# Patient Record
Sex: Female | Born: 1945 | Race: Black or African American | Hispanic: No | Smoking: Former smoker
Health system: Southern US, Community
[De-identification: ages and names within clinical notes are randomized; demographics above are authoritative.]

## PROBLEM LIST (undated history)

## (undated) DIAGNOSIS — I10 Essential (primary) hypertension: Secondary | ICD-10-CM

## (undated) DIAGNOSIS — C50919 Malignant neoplasm of unspecified site of unspecified female breast: Secondary | ICD-10-CM

## (undated) DIAGNOSIS — K56609 Unspecified intestinal obstruction, unspecified as to partial versus complete obstruction: Secondary | ICD-10-CM

## (undated) DIAGNOSIS — J45909 Unspecified asthma, uncomplicated: Secondary | ICD-10-CM

## (undated) DIAGNOSIS — E119 Type 2 diabetes mellitus without complications: Secondary | ICD-10-CM

## (undated) DIAGNOSIS — M1990 Unspecified osteoarthritis, unspecified site: Secondary | ICD-10-CM

## (undated) HISTORY — PX: TONSILLECTOMY: SUR1361

## (undated) HISTORY — PX: ABDOMINAL HYSTERECTOMY: SHX81

## (undated) HISTORY — PX: THUMB AMPUTATION: SHX804

---

## 2018-06-11 DIAGNOSIS — K56609 Unspecified intestinal obstruction, unspecified as to partial versus complete obstruction: Secondary | ICD-10-CM

## 2018-06-11 HISTORY — DX: Unspecified intestinal obstruction, unspecified as to partial versus complete obstruction: K56.609

## 2018-07-06 ENCOUNTER — Emergency Department (HOSPITAL_COMMUNITY): Payer: Medicare Other

## 2018-07-06 ENCOUNTER — Other Ambulatory Visit: Payer: Self-pay

## 2018-07-06 ENCOUNTER — Inpatient Hospital Stay (HOSPITAL_COMMUNITY): Payer: Medicare Other

## 2018-07-06 ENCOUNTER — Encounter (HOSPITAL_COMMUNITY): Payer: Self-pay | Admitting: Emergency Medicine

## 2018-07-06 ENCOUNTER — Inpatient Hospital Stay (HOSPITAL_COMMUNITY)
Admission: EM | Admit: 2018-07-06 | Discharge: 2018-07-11 | DRG: 388 | Disposition: A | Discharge status: Skilled Nursing Facility | Payer: Medicare Other | Attending: Surgery | Admitting: Surgery

## 2018-07-06 ENCOUNTER — Observation Stay (HOSPITAL_COMMUNITY): Payer: Medicare Other

## 2018-07-06 DIAGNOSIS — I10 Essential (primary) hypertension: Secondary | ICD-10-CM | POA: Diagnosis present

## 2018-07-06 DIAGNOSIS — Z794 Long term (current) use of insulin: Secondary | ICD-10-CM

## 2018-07-06 DIAGNOSIS — R32 Unspecified urinary incontinence: Secondary | ICD-10-CM | POA: Diagnosis present

## 2018-07-06 DIAGNOSIS — R0902 Hypoxemia: Secondary | ICD-10-CM

## 2018-07-06 DIAGNOSIS — Z9221 Personal history of antineoplastic chemotherapy: Secondary | ICD-10-CM | POA: Diagnosis not present

## 2018-07-06 DIAGNOSIS — Z923 Personal history of irradiation: Secondary | ICD-10-CM

## 2018-07-06 DIAGNOSIS — R911 Solitary pulmonary nodule: Secondary | ICD-10-CM | POA: Diagnosis present

## 2018-07-06 DIAGNOSIS — J9601 Acute respiratory failure with hypoxia: Secondary | ICD-10-CM | POA: Diagnosis not present

## 2018-07-06 DIAGNOSIS — J811 Chronic pulmonary edema: Secondary | ICD-10-CM | POA: Diagnosis present

## 2018-07-06 DIAGNOSIS — Z87891 Personal history of nicotine dependence: Secondary | ICD-10-CM

## 2018-07-06 DIAGNOSIS — K565 Intestinal adhesions [bands], unspecified as to partial versus complete obstruction: Secondary | ICD-10-CM

## 2018-07-06 DIAGNOSIS — K56609 Unspecified intestinal obstruction, unspecified as to partial versus complete obstruction: Secondary | ICD-10-CM | POA: Diagnosis not present

## 2018-07-06 DIAGNOSIS — Z853 Personal history of malignant neoplasm of breast: Secondary | ICD-10-CM | POA: Diagnosis not present

## 2018-07-06 DIAGNOSIS — J449 Chronic obstructive pulmonary disease, unspecified: Secondary | ICD-10-CM | POA: Diagnosis present

## 2018-07-06 DIAGNOSIS — Z90710 Acquired absence of both cervix and uterus: Secondary | ICD-10-CM

## 2018-07-06 DIAGNOSIS — E119 Type 2 diabetes mellitus without complications: Secondary | ICD-10-CM | POA: Diagnosis present

## 2018-07-06 DIAGNOSIS — R05 Cough: Secondary | ICD-10-CM

## 2018-07-06 DIAGNOSIS — G4733 Obstructive sleep apnea (adult) (pediatric): Secondary | ICD-10-CM | POA: Diagnosis present

## 2018-07-06 DIAGNOSIS — Z0189 Encounter for other specified special examinations: Secondary | ICD-10-CM

## 2018-07-06 DIAGNOSIS — R058 Other specified cough: Secondary | ICD-10-CM

## 2018-07-06 HISTORY — DX: Malignant neoplasm of unspecified site of unspecified female breast: C50.919

## 2018-07-06 HISTORY — DX: Unspecified osteoarthritis, unspecified site: M19.90

## 2018-07-06 HISTORY — DX: Type 2 diabetes mellitus without complications: E11.9

## 2018-07-06 HISTORY — DX: Essential (primary) hypertension: I10

## 2018-07-06 HISTORY — DX: Unspecified intestinal obstruction, unspecified as to partial versus complete obstruction: K56.609

## 2018-07-06 HISTORY — DX: Unspecified asthma, uncomplicated: J45.909

## 2018-07-06 LAB — URINALYSIS, ROUTINE W REFLEX MICROSCOPIC
Bilirubin Urine: NEGATIVE
Glucose, UA: NEGATIVE mg/dL
Hgb urine dipstick: NEGATIVE
Ketones, ur: NEGATIVE mg/dL
Leukocytes,Ua: NEGATIVE
NITRITE: NEGATIVE
Protein, ur: NEGATIVE mg/dL
Specific Gravity, Urine: 1.031 — ABNORMAL HIGH (ref 1.005–1.030)
pH: 7 (ref 5.0–8.0)

## 2018-07-06 LAB — COMPREHENSIVE METABOLIC PANEL
ALT: 14 U/L (ref 0–44)
AST: 31 U/L (ref 15–41)
Albumin: 3.6 g/dL (ref 3.5–5.0)
Alkaline Phosphatase: 48 U/L (ref 38–126)
Anion gap: 13 (ref 5–15)
BUN: 5 mg/dL — ABNORMAL LOW (ref 8–23)
CO2: 26 mmol/L (ref 22–32)
Calcium: 9 mg/dL (ref 8.9–10.3)
Chloride: 95 mmol/L — ABNORMAL LOW (ref 98–111)
Creatinine, Ser: 0.85 mg/dL (ref 0.44–1.00)
GFR calc non Af Amer: >60 mL/min (ref >60)
Glucose, Bld: 191 mg/dL — ABNORMAL HIGH (ref 70–99)
Potassium: 3.8 mmol/L (ref 3.5–5.1)
Sodium: 134 mmol/L — ABNORMAL LOW (ref 135–145)
Total Bilirubin: 0.5 mg/dL (ref 0.3–1.2)
Total Protein: 7.2 g/dL (ref 6.5–8.1)

## 2018-07-06 LAB — CBC WITH DIFFERENTIAL/PLATELET
Abs Immature Granulocytes: 0.04 10*3/uL (ref 0.00–0.07)
Basophils Absolute: 0 10*3/uL (ref 0.0–0.1)
Basophils Relative: 0 %
EOS PCT: 1 %
Eosinophils Absolute: 0.1 10*3/uL (ref 0.0–0.5)
HCT: 40.7 % (ref 36.0–46.0)
Hemoglobin: 13.1 g/dL (ref 12.0–15.0)
Immature Granulocytes: 0 %
Lymphocytes Relative: 26 %
Lymphs Abs: 2.9 10*3/uL (ref 0.7–4.0)
MCH: 29.9 pg (ref 26.0–34.0)
MCHC: 32.2 g/dL (ref 30.0–36.0)
MCV: 92.9 fL (ref 80.0–100.0)
MONO ABS: 0.7 10*3/uL (ref 0.1–1.0)
Monocytes Relative: 6 %
Neutro Abs: 7.6 10*3/uL (ref 1.7–7.7)
Neutrophils Relative %: 67 %
Platelets: 278 10*3/uL (ref 150–400)
RBC: 4.38 MIL/uL (ref 3.87–5.11)
RDW: 11.9 % (ref 11.5–15.5)
WBC: 11.4 10*3/uL — ABNORMAL HIGH (ref 4.0–10.5)
nRBC: 0 % (ref 0.0–0.2)

## 2018-07-06 LAB — HEMOGLOBIN A1C
Hgb A1c MFr Bld: 7.1 % — ABNORMAL HIGH (ref 4.8–5.6)
Mean Plasma Glucose: 157.07 mg/dL

## 2018-07-06 LAB — GLUCOSE, CAPILLARY
Glucose-Capillary: 160 mg/dL — ABNORMAL HIGH (ref 70–99)
Glucose-Capillary: 144 mg/dL — ABNORMAL HIGH (ref 70–99)
Glucose-Capillary: 149 mg/dL — ABNORMAL HIGH (ref 70–99)

## 2018-07-06 LAB — MAGNESIUM: Magnesium: 1.6 mg/dL — ABNORMAL LOW (ref 1.7–2.4)

## 2018-07-06 LAB — LIPASE, BLOOD: Lipase: 36 U/L (ref 11–51)

## 2018-07-06 MED ORDER — MORPHINE SULFATE (PF) 4 MG/ML IV SOLN
4.0000 mg | Freq: Once | INTRAVENOUS | Status: AC
  Administered 2018-07-06: 4 mg via INTRAVENOUS
  Filled 2018-07-06: qty 1

## 2018-07-06 MED ORDER — DIPHENHYDRAMINE HCL 12.5 MG/5ML PO ELIX: 12.5000 mg | ORAL_SOLUTION | Freq: Four times a day (QID) | ORAL | Status: DC | PRN

## 2018-07-06 MED ORDER — SIMETHICONE 80 MG PO CHEW
40.0000 mg | CHEWABLE_TABLET | Freq: Four times a day (QID) | ORAL | Status: DC | PRN
  Filled 2018-07-06 (×2): qty 1

## 2018-07-06 MED ORDER — INSULIN ASPART 100 UNIT/ML ~~LOC~~ SOLN
0.0000 [IU] | Freq: Three times a day (TID) | SUBCUTANEOUS | Status: DC
  Administered 2018-07-06: 3 [IU] via SUBCUTANEOUS
  Administered 2018-07-06 – 2018-07-08 (×4): 2 [IU] via SUBCUTANEOUS
  Administered 2018-07-08 – 2018-07-09 (×3): 3 [IU] via SUBCUTANEOUS
  Administered 2018-07-10 (×3): 2 [IU] via SUBCUTANEOUS
  Administered 2018-07-11: 5 [IU] via SUBCUTANEOUS

## 2018-07-06 MED ORDER — IPRATROPIUM-ALBUTEROL 0.5-2.5 (3) MG/3ML IN SOLN: 3.0000 mL | RESPIRATORY_TRACT | Status: DC | PRN

## 2018-07-06 MED ORDER — ONDANSETRON 4 MG PO TBDP: 4.0000 mg | ORAL_TABLET | Freq: Four times a day (QID) | ORAL | Status: DC | PRN

## 2018-07-06 MED ORDER — MORPHINE SULFATE (PF) 2 MG/ML IV SOLN
2.0000 mg | INTRAVENOUS | Status: DC | PRN
  Administered 2018-07-06 (×2): 2 mg via INTRAVENOUS
  Filled 2018-07-06 (×2): qty 1

## 2018-07-06 MED ORDER — ENOXAPARIN SODIUM 40 MG/0.4ML ~~LOC~~ SOLN
40.0000 mg | SUBCUTANEOUS | Status: DC
  Administered 2018-07-06 – 2018-07-10 (×5): 40 mg via SUBCUTANEOUS
  Filled 2018-07-06 (×5): qty 0.4

## 2018-07-06 MED ORDER — DIPHENHYDRAMINE HCL 50 MG/ML IJ SOLN: 12.5000 mg | Freq: Four times a day (QID) | INTRAMUSCULAR | Status: DC | PRN

## 2018-07-06 MED ORDER — ACETAMINOPHEN 650 MG RE SUPP: 650.0000 mg | Freq: Four times a day (QID) | RECTAL | Status: DC | PRN

## 2018-07-06 MED ORDER — POTASSIUM CHLORIDE 10 MEQ/100ML IV SOLN
10.0000 meq | INTRAVENOUS | Status: AC
  Administered 2018-07-06 (×2): 10 meq via INTRAVENOUS
  Filled 2018-07-06 (×2): qty 100

## 2018-07-06 MED ORDER — IPRATROPIUM-ALBUTEROL 0.5-2.5 (3) MG/3ML IN SOLN
3.0000 mL | Freq: Once | RESPIRATORY_TRACT | Status: AC
  Administered 2018-07-06: 3 mL via RESPIRATORY_TRACT
  Filled 2018-07-06: qty 3

## 2018-07-06 MED ORDER — METHOCARBAMOL 500 MG PO TABS: 500.0000 mg | ORAL_TABLET | Freq: Four times a day (QID) | ORAL | Status: DC | PRN

## 2018-07-06 MED ORDER — DIATRIZOATE MEGLUMINE & SODIUM 66-10 % PO SOLN
90.0000 mL | Freq: Once | ORAL | Status: AC
  Administered 2018-07-06: 90 mL via NASOGASTRIC
  Filled 2018-07-06: qty 90

## 2018-07-06 MED ORDER — PANTOPRAZOLE SODIUM 40 MG IV SOLR
40.0000 mg | Freq: Every day | INTRAVENOUS | Status: DC
  Administered 2018-07-06 – 2018-07-07 (×2): 40 mg via INTRAVENOUS
  Filled 2018-07-06 (×2): qty 40

## 2018-07-06 MED ORDER — ACETAMINOPHEN 325 MG PO TABS
650.0000 mg | ORAL_TABLET | Freq: Four times a day (QID) | ORAL | Status: DC | PRN
  Administered 2018-07-07 – 2018-07-10 (×3): 650 mg via ORAL
  Filled 2018-07-06 (×3): qty 2

## 2018-07-06 MED ORDER — HYDRALAZINE HCL 20 MG/ML IJ SOLN: 10.0000 mg | INTRAMUSCULAR | Status: DC | PRN

## 2018-07-06 MED ORDER — ONDANSETRON HCL 4 MG/2ML IJ SOLN
4.0000 mg | Freq: Once | INTRAMUSCULAR | Status: AC
  Administered 2018-07-06: 4 mg via INTRAVENOUS
  Filled 2018-07-06: qty 2

## 2018-07-06 MED ORDER — IOHEXOL 300 MG/ML  SOLN
125.0000 mL | Freq: Once | INTRAMUSCULAR | Status: AC | PRN
  Administered 2018-07-06: 125 mL via INTRAVENOUS

## 2018-07-06 MED ORDER — INSULIN DETEMIR 100 UNIT/ML ~~LOC~~ SOLN
5.0000 [IU] | Freq: Every day | SUBCUTANEOUS | Status: DC
  Administered 2018-07-06 – 2018-07-10 (×5): 5 [IU] via SUBCUTANEOUS
  Filled 2018-07-06 (×6): qty 0.05

## 2018-07-06 MED ORDER — ONDANSETRON HCL 4 MG/2ML IJ SOLN: 4.0000 mg | Freq: Four times a day (QID) | INTRAMUSCULAR | Status: DC | PRN

## 2018-07-06 MED ORDER — MAGNESIUM SULFATE 2 GM/50ML IV SOLN
2.0000 g | Freq: Once | INTRAVENOUS | Status: AC
  Administered 2018-07-06: 2 g via INTRAVENOUS
  Filled 2018-07-06: qty 50

## 2018-07-06 MED ORDER — OXYCODONE HCL 5 MG PO TABS: 5.0000 mg | ORAL_TABLET | ORAL | Status: DC | PRN

## 2018-07-06 MED ORDER — SODIUM CHLORIDE 0.9 % IV SOLN
INTRAVENOUS | Status: DC
  Administered 2018-07-06 – 2018-07-08 (×5): via INTRAVENOUS

## 2018-07-06 NOTE — H&P (Addendum)
[facility] Surgery Admission Note  Charlene Houston 04-Apr-1946  [id number].    Requesting MD: Charlann Lange, PA-C; Delora Fuel MD Chief Complaint/Reason for Consult: SBO  HPI: Charlene Houston is a 73 y.o. female with a history of insulin-dependent diabetes, hypertension, asthma, and a remote history of breast cancer who presented to [facility] for abdominal pain, N/V and bloating.   Patient reports that around noon yesterday after eating a bag of potato chips she began having some sharp abdominal pain in her epigastric area that radiated to her back.  She notes that the abdominal pain was severe, and came in waves on/off.  It was associated with bloating, nausea as well as 2 episodes of nonbilious emesis with the last just prior to arrival to the emergency department.  The patient reports that she did have one small bowel movement approximately 1 hour after the onset of her symptoms. She denies further bowel movements and is no longer passing any flatus.  She did try water with baking soda at home without any relief.  She denies similar pain in the past.  She presented to the emergency department for evaluation and was found to have a small bowel obstruction on CT.  General surgery was consulted.  Patient denies history of small bowel obstruction in the past.  She does have a history of abdominal hysterectomy and also reports a exploratory laparoscopy for an ovarian surgery but I am unable to see this in her records or care everywhere.  Her symptoms have improved since IV pain medication and she is resting comfortably.  She denies any fever, chills, chest pain, cough urinary symptoms or diarrhea. She is not on blood thinners. She does note that she has some mild shortness of breath secondary to her asthma. She is currently on 2L in the ED. She does not wear oxygen at home.   ROS: Review of Systems  Constitutional: Negative for chills and fever.  Respiratory: Positive for shortness of  breath and wheezing. Negative for cough.   Cardiovascular: Negative for chest pain and leg swelling.  Gastrointestinal: Positive for abdominal pain, nausea and vomiting. Negative for blood in stool, constipation, diarrhea and melena.       Bloating  Genitourinary: Negative for dysuria and urgency.  Skin: Negative for rash.  All other systems reviewed and are negative.   All systems reviewed and otherwise negative except for as above  History reviewed. No pertinent family history.  Past Medical History:  Diagnosis Date  . Arthritis   . Asthma   . Breast cancer ([place])   . Diabetes mellitus without complication ([place])   . Hypertension     Past Surgical History:  Procedure Laterality Date  . ABDOMINAL HYSTERECTOMY      Social History:  reports that she has quit smoking. She has never used smokeless tobacco. She reports previous alcohol use. She reports previous drug use.  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 Lives with son  Allergies:  Allergies  Allergen Reactions  . Prozac [Fluoxetine Hcl] Other (See Comments)    Gittery    (Not in a hospital admission)   Prior to Admission medications   Not on File    Blood pressure 121/73, pulse 83, temperature 97.9 F (36.6 C), temperature source Oral, resp. rate 15, height 5\' 6"  (1.676 m), weight 106.6 kg, SpO2 98 %. Physical Exam: General: pleasant, WD/WN AA female who is laying in bed  in NAD HEENT: head is normocephalic, atraumatic.  Sclera are noninjected.  Pupils equal and round.  Ears and nose without any masses or lesions.  Mouth is pink and moist. Dentition fair Heart: regular, rate, and rhythm.  No obvious murmurs, gallops, or rubs noted.  Palpable pedal pulses bilaterally Lungs: Patient on 2L in room satting at 97-99%. When taken off 2L, O2 sats come to 84-86%. She has normal effort, that is nonlabored. There is some faint expiratory wheezing R  > L. No rales or rhonchi.   Abd: Soft, moderate distension, tenderness of the epigastrium with voluntary guarding. No involuntary guarding, rebound or rigidity. Hypoactive BS. No masses, hernias, or organomegaly MS: all 4 extremities are symmetrical with no cyanosis, clubbing, or edema. Skin: warm and dry with no masses, lesions, or rashes Psych: A&Ox3 with an appropriate affect. Neuro: cranial nerves grossly intact, extremity CSM intact bilaterally, normal speech  Results for orders placed or performed during the hospital encounter of 07/06/18 (from the past 48 hour(s))  CBC with Differential     Status: Abnormal   Collection Time: 07/06/18  3:36 AM  Result Value Ref Range   WBC 11.4 (H) 4.0 - 10.5 K/uL   RBC 4.38 3.87 - 5.11 MIL/uL   Hemoglobin 13.1 12.0 - 15.0 g/dL   HCT 40.7 36.0 - 46.0 %   MCV 92.9 80.0 - 100.0 fL   MCH 29.9 26.0 - 34.0 pg   MCHC 32.2 30.0 - 36.0 g/dL   RDW 11.9 11.5 - 15.5 %   Platelets 278 150 - 400 K/uL   nRBC 0.0 0.0 - 0.2 %   Neutrophils Relative % 67 %   Neutro Abs 7.6 1.7 - 7.7 K/uL   Lymphocytes Relative 26 %   Lymphs Abs 2.9 0.7 - 4.0 K/uL   Monocytes Relative 6 %   Monocytes Absolute 0.7 0.1 - 1.0 K/uL   Eosinophils Relative 1 %   Eosinophils Absolute 0.1 0.0 - 0.5 K/uL   Basophils Relative 0 %   Basophils Absolute 0.0 0.0 - 0.1 K/uL   Immature Granulocytes 0 %   Abs Immature Granulocytes 0.04 0.00 - 0.07 K/uL    Comment: Performed at [facility], [address], [place], [place] [address]  Lipase, blood     Status: None   Collection Time: 07/06/18  3:36 AM  Result Value Ref Range   Lipase 36 11 - 51 U/L    Comment: Performed at [facility] [address] [place], [place] [address]  Comprehensive metabolic panel     Status: Abnormal   Collection Time: 07/06/18  3:36 AM  Result Value Ref Range   Sodium 134 (L) 135 - 145 mmol/L   Potassium 3.8 3.5 - 5.1 mmol/L   Chloride 95 (L) 98 - 111 mmol/L   CO2 26 22 - 32 mmol/L    Glucose, Bld 191 (H) 70 - 99 mg/dL   BUN 5 (L) 8 - 23 mg/dL   Creatinine, Ser 0.85 0.44 - 1.00 mg/dL   Calcium 9.0 8.9 - 10.3 mg/dL   Total Protein 7.2 6.5 - 8.1 g/dL   Albumin 3.6 3.5 - 5.0 g/dL   AST 31 15 - 41 U/L   ALT 14 0 - 44 U/L   Alkaline Phosphatase 48 38 - 126 U/L   Total Bilirubin 0.5 0.3 - 1.2 mg/dL   GFR calc non Af Amer >60 >60 mL/min   GFR calc Af Amer >60 >60 mL/min   Anion  gap 13 5 - 15    Comment: Performed at [facility], [place] [address] [place], [place] [address]  Urinalysis, Routine w reflex microscopic     Status: Abnormal   Collection Time: 07/06/18  6:27 AM  Result Value Ref Range   Color, Urine YELLOW YELLOW   APPearance CLEAR CLEAR   Specific Gravity, Urine 1.031 (H) 1.005 - 1.030   pH 7.0 5.0 - 8.0   Glucose, UA NEGATIVE NEGATIVE mg/dL   Hgb urine dipstick NEGATIVE NEGATIVE   Bilirubin Urine NEGATIVE NEGATIVE   Ketones, ur NEGATIVE NEGATIVE mg/dL   Protein, ur NEGATIVE NEGATIVE mg/dL   Nitrite NEGATIVE NEGATIVE   Leukocytes,Ua NEGATIVE NEGATIVE    Comment: Performed at [facility] [address], [place], [place] [address]   Ct Abdomen Pelvis W Contrast  Result Date: 07/06/2018 CLINICAL DATA:  Nausea and vomiting EXAM: CT ABDOMEN AND PELVIS WITH CONTRAST TECHNIQUE: Multidetector CT imaging of the abdomen and pelvis was performed using the standard protocol following bolus administration of intravenous contrast. CONTRAST:  162mL OMNIPAQUE IOHEXOL 300 MG/ML  SOLN COMPARISON:  None. FINDINGS: Lower chest: Bilateral lung nodules at the bases, including a lobulated nodule in the left lower lobe measuring 2 cm. This likely accounts for the patchy lung opacity on preceding radiograph. Hepatobiliary: Hepatic steatosis. No focal hepatic finding. Cholelithiasis. Pancreas: Unremarkable. Spleen: Unremarkable. Adrenals/Urinary Tract: Negative adrenals. No hydronephrosis or ureteral stone. Right renal cystic density. Small right renal calculus.  Unremarkable bladder. Stomach/Bowel: Dilated small bowel with fluid levels and distally in the abnormal segment there is fecalized contents immediately proximal to a transition point in the deep upper abdomen, located just below the ligament of Treitz. Distal small bowel and colon is relatively decompressed. There is reactive edema within the mesentery. The obstruction is most likely due to adhesion. No discrete internal hernia sac or underlying masslike finding. No perforation. Probable small distal duodenal lipoma. Vascular/Lymphatic: No acute vascular abnormality. No mass or adenopathy. Reproductive:Hysterectomy. Other: No ascites or pneumoperitoneum. Musculoskeletal: Remote L4 superior endplate fracture. Lumbar spine degeneration. IMPRESSION: 1. High-grade proximal small bowel obstruction. 2. Lower lobe nodules, including a 2 cm nodule on the left-likely metastatic disease in this patient with history of breast cancer. Recommend oncology follow-up. 3. Chronic findings are described above. 4. Hepatic steatosis and cholelithiasis. Electronically Signed   By: Monte Fantasia M.D.   On: 07/06/2018 06:24   Dg Abd Acute 2+v W 1v Chest  Result Date: 07/06/2018 CLINICAL DATA:  Abdominal pain and vomiting EXAM: DG ABDOMEN ACUTE W/ 1V CHEST COMPARISON:  None. FINDINGS: Dilated small bowel loops with fluid levels in the central abdomen. No evidence of pneumoperitoneum. Patchy bilateral lung opacity, airspace disease not excluded. Borderline heart size. No concerning mass effect or gas collection IMPRESSION: 1. Findings of small bowel obstruction, reference upcoming abdominal CT. No pneumoperitoneum. 2. Patchy bilateral lung opacity, attention at the lung bases on pending CT. Electronically Signed   By: Monte Fantasia M.D.   On: 07/06/2018 06:07      Assessment/Plan SBO - CT with high grade SBO.  - NG tube. SBO protocol - Keep K >4 and Mg >2 for bowel function - Mobilize for bowel function  HTN - PRN  hydralazine - Will reorder home medications when tolerating PO  IDDM - SSI - HgbA1c  Hx of Breast Cancer - S/p chemo/radiation and right lumpectomy and axillary lymph node dissection in 2007 of the right breast - CT with lower lobe nodules, including a  2 cm nodule on the left-likely metastatic disease in this patient with history of breast cancer. Patient states she is aware of this and has been followed for this by her oncologist in Dorma Russell MD. She has continued to follow with her reportedly since moving to [place] 2 years ago. Will have the patient follow up with her for this.   Asthma - PRN Duonebs  ID - None VTE - SCD, Lovenox FEN - NPO, NG tube, IVF Foley - None Follow up - TBD. Oncologist, Gustavo Lah MD  Plan - Admit to med surg. NPO, bowel rest, IVF, pain control, antiemetics, SBO protocol. Ambulate and IS. Plan as above.  Jillyn Ledger, [facility] Surgery 07/06/2018, 8:20 AM Pager: [phone]

## 2018-07-06 NOTE — ED Provider Notes (Signed)
[facility] EMERGENCY DEPARTMENT Provider Note   CSN: [id number] Arrival date & time: 07/06/18  0938    History   Chief Complaint Chief Complaint  Patient presents with  . Abdominal Pain    HPI Ailyne Pawley is a 73 y.o. female.     Patient with a history of DM, HTN presents with onset yesterday of epigastric pain, nausea, vomiting. She reports remote history stomach ulcers but has not had symptoms or been treated for years. No hematemesis. No diarrhea or fever. She denies CP. She has a chronic SOB and cough that she states is unchanged. The epigastric pain radiates through to her back and is described as sharp and constant. She tried baking soda and water to relieve symptoms without success. No sick contacts. No recent change to or any new medications. She feels her abdomen is fairly distended/bloated. Last bowel movement was yesterday and was normal in volume and color, no melena.   The history is provided by the patient. No language interpreter was used.  Abdominal Pain  Associated symptoms: nausea and vomiting   Associated symptoms: no chills, no diarrhea, no dysuria and no fever     Past Medical History:  Diagnosis Date  . Arthritis   . Asthma   . Breast cancer ([place])   . Diabetes mellitus without complication ([place])   . Hypertension     There are no active problems to display for this patient.   Past Surgical History:  Procedure Laterality Date  . ABDOMINAL HYSTERECTOMY       OB History   No obstetric history on file.      Home Medications    Prior to Admission medications   Not on File    Family History History reviewed. No pertinent family history.  Social History Social History   Tobacco Use  . Smoking status: Former Research scientist (life sciences)  . Smokeless tobacco: Never Used  Substance Use Topics  . Alcohol use: Not Currently  . Drug use: Not Currently     Allergies   Prozac [fluoxetine hcl]   Review of Systems Review of  Systems  Constitutional: Negative for chills and fever.  HENT: Negative.   Respiratory: Negative.   Cardiovascular: Negative.   Gastrointestinal: Positive for abdominal pain, nausea and vomiting. Negative for blood in stool and diarrhea.  Genitourinary: Negative.  Negative for dysuria.  Musculoskeletal: Negative.   Skin: Negative.   Neurological: Negative.      Physical Exam Updated Vital Signs BP 140/73   Pulse (!) 103   Temp 97.9 F (36.6 C) (Oral)   Resp 18   Ht 5\' 6"  (1.676 m)   Wt 106.6 kg   SpO2 95%   BMI 37.93 kg/m   Physical Exam Constitutional:      General: She is not in acute distress.    Appearance: She is well-developed. She is not ill-appearing.  HENT:     Head: Normocephalic.     Mouth/Throat:     Mouth: Mucous membranes are dry.  Eyes:     Conjunctiva/sclera: Conjunctivae normal.  Neck:     Musculoskeletal: Normal range of motion and neck supple.  Cardiovascular:     Rate and Rhythm: Normal rate and regular rhythm.     Heart sounds: No murmur.  Pulmonary:     Effort: Pulmonary effort is normal.     Breath sounds: Normal breath sounds. No wheezing, rhonchi or rales.  Abdominal:     General: There is distension.  Tenderness: There is abdominal tenderness (epigastrium). There is no guarding or rebound.  Musculoskeletal: Normal range of motion.  Skin:    General: Skin is warm and dry.     Findings: No rash.  Neurological:     Mental Status: She is alert and oriented to person, place, and time.      ED Treatments / Results  Labs (all labs ordered are listed, but only abnormal results are displayed) Labs Reviewed  CBC WITH DIFFERENTIAL/PLATELET  LIPASE, BLOOD  COMPREHENSIVE METABOLIC PANEL  URINALYSIS, ROUTINE W REFLEX MICROSCOPIC    EKG None  Radiology No results found.  Procedures Procedures (including critical care time)  Medications Ordered in ED Medications  ondansetron (ZOFRAN) injection 4 mg (has no administration in  time range)  morphine 4 MG/ML injection 4 mg (has no administration in time range)     Initial Impression / Assessment and Plan / ED Course  I have reviewed the triage vital signs and the nursing notes.  Pertinent labs & imaging results that were available during my care of the patient were reviewed by me and considered in my medical decision making (see chart for details).        Patient to ED with N, V, abdominal distention and epigastric pain that started yesterday. No constipation or diarrhea. No fever.   The patient's abdomen is taut but without peritonitis. Tenderness is limited to the epigastric area. Labs, IV, medications ordered. Plan to obtain abd series when patient has been medicated and labs collected so as not to delay care.   The patient is seen by Dr. Roxanne Mins who feels CT will be necessary given patient's presentation and likelihood of bowel obstruction. CT abd/pel shows high grade proximal small bowel obstruction, likely due to adhesion.   Patient updated on results and diagnosis. She reports her pain is managed, no further nausea or vomiting. NG tube deferred until consult with general surgery.   CT incidentally shows a left LL pulmonary nodule felt likely to be metastatic disease from previous breast cancer. This was discussed with the patient who was aware of this concern and states the nodule was observed for a period of time in the past.   Discussed the bowel obstruction with Dr. Kae Heller (Gen Surg) who will see the patient for admission. Recommended NG tube placement which has been ordered.   Final Clinical Impressions(s) / ED Diagnoses   Final diagnoses:  None   1. High grade small bowel obstruction 2. Pulmonary nodule  ED Discharge Orders    None       Charlann Lange, Hershal Coria 37/90/24 0973    Delora Fuel, MD 53/29/92 [phone]

## 2018-07-06 NOTE — ED Triage Notes (Signed)
C/O of N/V all day with abdominal pain that radiates to pt's back.

## 2018-07-07 LAB — BASIC METABOLIC PANEL
Anion gap: 12 (ref 5–15)
BUN: <5 mg/dL — ABNORMAL LOW (ref 8–23)
CO2: 26 mmol/L (ref 22–32)
Calcium: 8 mg/dL — ABNORMAL LOW (ref 8.9–10.3)
Chloride: 100 mmol/L (ref 98–111)
Creatinine, Ser: 0.74 mg/dL (ref 0.44–1.00)
GFR calc Af Amer: >60 mL/min (ref >60)
GFR calc non Af Amer: >60 mL/min (ref >60)
Glucose, Bld: 144 mg/dL — ABNORMAL HIGH (ref 70–99)
Potassium: 3.8 mmol/L (ref 3.5–5.1)
Sodium: 138 mmol/L (ref 135–145)

## 2018-07-07 LAB — GLUCOSE, CAPILLARY
GLUCOSE-CAPILLARY: 103 mg/dL — AB (ref 70–99)
Glucose-Capillary: 144 mg/dL — ABNORMAL HIGH (ref 70–99)
Glucose-Capillary: 143 mg/dL — ABNORMAL HIGH (ref 70–99)
Glucose-Capillary: 178 mg/dL — ABNORMAL HIGH (ref 70–99)

## 2018-07-07 LAB — MAGNESIUM: Magnesium: 2 mg/dL (ref 1.7–2.4)

## 2018-07-07 NOTE — Progress Notes (Signed)
[facility]/Trauma Progress Note      Assessment/Plan  HTN - Will reorder home medications when tolerating PO IDDM - SSI - HgbA1c 7.1 Hx of Breast Cancer - S/p chemo/radiation and right lumpectomy and axillary lymph node dissection in 2007 of the right breast - CT with lower lobe nodules, including a 2 cm nodule on the left-likely metastatic disease in this patient with history of breast cancer. Patient states she is aware of this and has been followed for this by her oncologist in Dorma Russell MD. She has continued to follow with her reportedly since moving to [place] 2 years ago. Will have the patient follow up with her for this.  Asthma - PRN Duonebs  SBO - CT with high grade SBO.  - SBO protocol showed contrast in colon - Mobilize for bowel function - had a BM and flatus - NGT got pulled out some last night. Not seen on AXR this am, will pull and allow sips  ID - None VTE - SCD, Lovenox FEN - sips Foley - None Follow up - TBD. Oncologist, Gustavo Lah MD   LOS: 1 day    Subjective: CC: SBO  Had a watery BM and is having flatus. NGT got dislodged overnight. Mild nausea overnight but no vomiting. No abdominal pain.   Objective: Vital signs in last 24 hours: Temp:  [98.3 F (36.8 C)-98.7 F (37.1 C)] 98.4 F (36.9 C) (03/27 0428) Pulse Rate:  [82-103] 92 (03/27 0428) Resp:  [16-20] 18 (03/27 0428) BP: (110-153)/(61-74) 110/61 (03/27 0428) SpO2:  [87 %-98 %] 95 % (03/27 0428) Last BM Date: 07/05/18  Intake/Output from previous day: 03/26 0701 - 03/27 0700 In: 797.7 [I.V.:734.7; IV Piggyback:63] Out: 850 [Emesis/NG output:850] Intake/Output this shift: No intake/output data recorded.  PE: Gen:  Alert, NAD, pleasant, cooperative Card:  RRR, mild systolic murmur noted Pulm:  CTA, no W/R/R, rate and effort normal Abd: Soft, ND, +BS, TTP of epigastric region with guarding, no peritonitis  Skin: no rashes noted, warm and  dry   Anti-infectives: Anti-infectives (From admission, onward)   None      Lab Results:  Recent Labs    07/06/18 0336  WBC 11.4*  HGB 13.1  HCT 40.7  PLT 278   BMET Recent Labs    07/06/18 0336 07/07/18 0208  NA 134* 138  K 3.8 3.8  CL 95* 100  CO2 26 26  GLUCOSE 191* 144*  BUN 5* <5*  CREATININE 0.85 0.74  CALCIUM 9.0 8.0*   PT/INR No results for input(s): LABPROT, INR in the last 72 hours. CMP     Component Value Date/Time   NA 138 07/07/2018 0208   K 3.8 07/07/2018 0208   CL 100 07/07/2018 0208   CO2 26 07/07/2018 0208   GLUCOSE 144 (H) 07/07/2018 0208   BUN <5 (L) 07/07/2018 0208   CREATININE 0.74 07/07/2018 0208   CALCIUM 8.0 (L) 07/07/2018 0208   PROT 7.2 07/06/2018 0336   ALBUMIN 3.6 07/06/2018 0336   AST 31 07/06/2018 0336   ALT 14 07/06/2018 0336   ALKPHOS 48 07/06/2018 0336   BILITOT 0.5 07/06/2018 0336   GFRNONAA >60 07/07/2018 0208   GFRAA >60 07/07/2018 0208   Lipase     Component Value Date/Time   LIPASE 36 07/06/2018 0336    Studies/Results: Ct Abdomen Pelvis W Contrast  Result Date: 07/06/2018 CLINICAL DATA:  Nausea and vomiting EXAM: CT ABDOMEN AND PELVIS WITH CONTRAST TECHNIQUE: Multidetector CT imaging of the  abdomen and pelvis was performed using the standard protocol following bolus administration of intravenous contrast. CONTRAST:  123mL OMNIPAQUE IOHEXOL 300 MG/ML  SOLN COMPARISON:  None. FINDINGS: Lower chest: Bilateral lung nodules at the bases, including a lobulated nodule in the left lower lobe measuring 2 cm. This likely accounts for the patchy lung opacity on preceding radiograph. Hepatobiliary: Hepatic steatosis. No focal hepatic finding. Cholelithiasis. Pancreas: Unremarkable. Spleen: Unremarkable. Adrenals/Urinary Tract: Negative adrenals. No hydronephrosis or ureteral stone. Right renal cystic density. Small right renal calculus. Unremarkable bladder. Stomach/Bowel: Dilated small bowel with fluid levels and distally in  the abnormal segment there is fecalized contents immediately proximal to a transition point in the deep upper abdomen, located just below the ligament of Treitz. Distal small bowel and colon is relatively decompressed. There is reactive edema within the mesentery. The obstruction is most likely due to adhesion. No discrete internal hernia sac or underlying masslike finding. No perforation. Probable small distal duodenal lipoma. Vascular/Lymphatic: No acute vascular abnormality. No mass or adenopathy. Reproductive:Hysterectomy. Other: No ascites or pneumoperitoneum. Musculoskeletal: Remote L4 superior endplate fracture. Lumbar spine degeneration. IMPRESSION: 1. High-grade proximal small bowel obstruction. 2. Lower lobe nodules, including a 2 cm nodule on the left-likely metastatic disease in this patient with history of breast cancer. Recommend oncology follow-up. 3. Chronic findings are described above. 4. Hepatic steatosis and cholelithiasis. Electronically Signed   By: Monte Fantasia M.D.   On: 07/06/2018 06:24   Dg Abd Acute 2+v W 1v Chest  Result Date: 07/06/2018 CLINICAL DATA:  Abdominal pain and vomiting EXAM: DG ABDOMEN ACUTE W/ 1V CHEST COMPARISON:  None. FINDINGS: Dilated small bowel loops with fluid levels in the central abdomen. No evidence of pneumoperitoneum. Patchy bilateral lung opacity, airspace disease not excluded. Borderline heart size. No concerning mass effect or gas collection IMPRESSION: 1. Findings of small bowel obstruction, reference upcoming abdominal CT. No pneumoperitoneum. 2. Patchy bilateral lung opacity, attention at the lung bases on pending CT. Electronically Signed   By: Monte Fantasia M.D.   On: 07/06/2018 06:07   Dg Abd Portable 1v-small Bowel Obstruction Protocol-initial, 8 Hr Delay  Result Date: 07/06/2018 CLINICAL DATA:  Follow-up examination for small bowel obstruction. EXAM: PORTABLE ABDOMEN - 1 VIEW COMPARISON:  Prior radiograph and CT from earlier the same day.  FINDINGS: Nasogastric tube has been removed and is no longer visualized. Few scattered nondilated gas-filled loops of small bowel seen within the mid and left lower abdomen, measuring up to 2.8 cm in diameter. Overall degree of small bowel dilatation felt to be likely slightly decreased as compared to previous. Scattered enteric contrast material present within the stomach as well as scattered loops of small and large bowel. Overall, appearance is likely somewhat improved as compared to prior CT. No other new or progressive finding. IMPRESSION: Persistent but mildly improved appearance of ongoing small bowel obstruction as compared to prior CT from earlier the same day. Electronically Signed   By: Jeannine Boga M.D.   On: 07/06/2018 19:40   Dg Abd Portable 1v-small Bowel Protocol-position Verification  Result Date: 07/06/2018 CLINICAL DATA:  NG tube placement EXAM: PORTABLE ABDOMEN - 1 VIEW COMPARISON:  CT abdomen 07/06/2018 FINDINGS: Nasogastric tube with the tip projecting over the stomach and the proximal port at the esophagogastric junction. Recommend advancing the nasogastric tube 8 cm. There is no bowel dilatation to suggest obstruction. There is no evidence of pneumoperitoneum, portal venous gas or pneumatosis. There are no pathologic calcifications along the expected course of the ureters. There  is a 2 cm left lower lobe pulmonary nodule. The osseous structures are unremarkable. IMPRESSION: 1. Nasogastric tube with the tip projecting over the stomach and the proximal port at the esophagogastric junction. Recommend advancing the nasogastric tube 8 cm. 2. 2 cm left lower lobe pulmonary nodule concerning for metastatic disease. Electronically Signed   By: Kathreen Devoid   On: 07/06/2018 09:11      Kalman Drape , [facility] Surgery 07/07/2018, 8:56 AM  Pager: [phone] Mon-Wed, Friday 7:00am-4:30pm Thurs 7am-11:30am  Consults: [phone]

## 2018-07-07 NOTE — TOC Initial Note (Addendum)
Transition of Care [facility]) - Initial/Assessment Note    Patient Details  Name: Shaelin Lalley MRN: [id number] Date of Birth: [birth year]  Transition of Care [facility]) CM/SW Contact:    Marilu Favre, RN Phone Number: 07/07/2018, 9:47 AM  Clinical Narrative:                 Patient from home. Has PCP DR Lorrin Jackson   Patient lives with son who is a long distance truck driver, however, her daughter lives a block away from her and can provide support if needed at discharge.   Patient has prescription coverage and transportation to doctor appointments.  Has walker, electric wheel chair and shower chair at home.   Expected Discharge Plan: Home/Self Care Barriers to Discharge: Continued Medical Work up   Patient Goals and CMS Choice Patient states their goals for this hospitalization and ongoing recovery are:: I want to go home    Choice offered to / list presented to : NA  Expected Discharge Plan and Services Expected Discharge Plan: Home/Self Care       Living arrangements for the past 2 months: Single Family Home                   DME Agency: NA HH Arranged: NA HH Agency: NA  Prior Living Arrangements/Services Living arrangements for the past 2 months: Single Family Home Lives with:: Adult Children(Lives with son who is a long distance Administrator, daughter lives a block away who can assist her at discharge if needed) Patient language and need for interpreter reviewed:: No Do you feel safe going back to the place where you live?: Yes      Need for Family Participation in Patient Care: No (Comment) Care giver support system in place?: Yes (comment) Current home services: DME Criminal Activity/Legal Involvement Pertinent to Current Situation/Hospitalization: No - Comment as needed  Activities of Daily Living Home Assistive Devices/Equipment: Wheelchair, Environmental consultant (specify type), Grab bars in shower ADL Screening (condition at time of admission) Patient's  cognitive ability adequate to safely complete daily activities?: Yes Is the patient deaf or have difficulty hearing?: No Does the patient have difficulty seeing, even when wearing glasses/contacts?: No Does the patient have difficulty concentrating, remembering, or making decisions?: No Patient able to express need for assistance with ADLs?: Yes Does the patient have difficulty dressing or bathing?: No Independently performs ADLs?: Yes (appropriate for developmental age) Does the patient have difficulty walking or climbing stairs?: Yes Weakness of Legs: Both Weakness of Arms/Hands: None  Permission Sought/Granted                  Emotional Assessment              Admission diagnosis:  Small bowel obstruction ([place]) [K56.609] Pulmonary nodule [R91.1] Encounter for imaging study to confirm nasogastric (NG) tube placement [Z01.89] Small bowel obstruction due to adhesions [facility]) [K56.50] Patient Active Problem List   Diagnosis Date Noted  . SBO (small bowel obstruction) ([place]) 07/06/2018   PCP:  Derwood Kaplan, MD Pharmacy:   [facility] [place], [address] [place] [address] Phone: [phone] Fax: [phone]     Social Determinants of Health (SDOH) Interventions    Readmission Risk Interventions No flowsheet data found.

## 2018-07-07 NOTE — Plan of Care (Signed)

## 2018-07-08 ENCOUNTER — Inpatient Hospital Stay (HOSPITAL_COMMUNITY): Payer: Medicare Other

## 2018-07-08 LAB — GLUCOSE, CAPILLARY
Glucose-Capillary: 124 mg/dL — ABNORMAL HIGH (ref 70–99)
Glucose-Capillary: 182 mg/dL — ABNORMAL HIGH (ref 70–99)
Glucose-Capillary: 151 mg/dL — ABNORMAL HIGH (ref 70–99)
Glucose-Capillary: 138 mg/dL — ABNORMAL HIGH (ref 70–99)

## 2018-07-08 MED ORDER — ALBUTEROL SULFATE (2.5 MG/3ML) 0.083% IN NEBU: 2.5000 mg | INHALATION_SOLUTION | Freq: Four times a day (QID) | RESPIRATORY_TRACT | Status: DC | PRN

## 2018-07-08 MED ORDER — FLUTICASONE PROPIONATE 50 MCG/ACT NA SUSP: 1.0000 | Freq: Every day | NASAL | Status: DC | PRN

## 2018-07-08 MED ORDER — PANTOPRAZOLE SODIUM 40 MG PO TBEC
40.0000 mg | DELAYED_RELEASE_TABLET | Freq: Every day | ORAL | Status: DC
  Administered 2018-07-08 – 2018-07-10 (×3): 40 mg via ORAL
  Filled 2018-07-08 (×3): qty 1

## 2018-07-08 MED ORDER — BISACODYL 10 MG RE SUPP
10.0000 mg | Freq: Once | RECTAL | Status: AC
  Administered 2018-07-08: 10 mg via RECTAL
  Filled 2018-07-08: qty 1

## 2018-07-08 MED ORDER — ESCITALOPRAM OXALATE 10 MG PO TABS
5.0000 mg | ORAL_TABLET | Freq: Every day | ORAL | Status: DC
  Administered 2018-07-08 – 2018-07-11 (×4): 5 mg via ORAL
  Filled 2018-07-08 (×4): qty 1

## 2018-07-08 MED ORDER — MIRTAZAPINE 15 MG PO TABS
15.0000 mg | ORAL_TABLET | Freq: Every day | ORAL | Status: DC
  Administered 2018-07-08 – 2018-07-10 (×3): 15 mg via ORAL
  Filled 2018-07-08 (×3): qty 1

## 2018-07-08 MED ORDER — LISINOPRIL 20 MG PO TABS
20.0000 mg | ORAL_TABLET | Freq: Every day | ORAL | Status: DC
  Administered 2018-07-08 – 2018-07-11 (×4): 20 mg via ORAL
  Filled 2018-07-08 (×4): qty 1

## 2018-07-08 MED ORDER — HYDROCHLOROTHIAZIDE 12.5 MG PO CAPS
12.5000 mg | ORAL_CAPSULE | Freq: Every day | ORAL | Status: DC
  Administered 2018-07-08 – 2018-07-10 (×3): 12.5 mg via ORAL
  Filled 2018-07-08 (×3): qty 1

## 2018-07-08 MED ORDER — GABAPENTIN 300 MG PO CAPS
300.0000 mg | ORAL_CAPSULE | Freq: Three times a day (TID) | ORAL | Status: DC
  Administered 2018-07-08 – 2018-07-11 (×14): 300 mg via ORAL
  Filled 2018-07-08 (×14): qty 1

## 2018-07-08 MED ORDER — PRAVASTATIN SODIUM 40 MG PO TABS
40.0000 mg | ORAL_TABLET | Freq: Every day | ORAL | Status: DC
  Administered 2018-07-08 – 2018-07-11 (×4): 40 mg via ORAL
  Filled 2018-07-08 (×4): qty 1

## 2018-07-08 MED ORDER — IPRATROPIUM-ALBUTEROL 0.5-2.5 (3) MG/3ML IN SOLN
3.0000 mL | Freq: Once | RESPIRATORY_TRACT | Status: AC
  Administered 2018-07-08: 3 mL via RESPIRATORY_TRACT
  Filled 2018-07-08: qty 3

## 2018-07-08 NOTE — Evaluation (Signed)
Physical Therapy Evaluation Patient Details Name: Charlene Houston MRN: [id number] DOB: March 22, 1946 Today's Date: 07/08/2018   History of Present Illness  Pt is a 73 y/o female admitted secondary to worsening abdominal pain. Found to have SBO. PMH includes asthma, breast cancer, HTN, and DM.   Clinical Impression  Pt admitted secondary to problem above with deficits below. Pt with weakness and unsteadiness during transfer to [facility]. Required min guard A for mobility tasks. Feel pt is at increased risk for falls at this time and would benefit from SNF level therapies as she has decreased assist at home and has full flight of steps to perform. However, if pt progresses well with acute PT, may be able to d/c home with [facility] services. Will continue to follow acutely and update recommendations as able.     Follow Up Recommendations SNF;Supervision for mobility/OOB    Equipment Recommendations  3in1 (PT)    Recommendations for Other Services OT consult     Precautions / Restrictions Precautions Precautions: Fall Restrictions Weight Bearing Restrictions: No      Mobility  Bed Mobility Overal bed mobility: Needs Assistance Bed Mobility: Supine to Sit;Sit to Supine     Supine to sit: Supervision;HOB elevated Sit to supine: Supervision;HOB elevated   General bed mobility comments: Supervision and increased time required to come to EOB. Required heavy use of bed rails.  Transfers Overall transfer level: Needs assistance Equipment used: None Transfers: Sit to/from [facility] Sit to Stand: Min guard Stand pivot transfers: Min guard       General transfer comment: Min guard for safety to transfer to [facility] and back to chair. Very guarded steps and pt reaching for [facility] when transferring for support. Further mobility limited as pt needing to have breathing treatment and follow up x ray.   Ambulation/Gait                Stairs            Wheelchair  Mobility    Modified Rankin (Stroke Patients Only)       Balance Overall balance assessment: Needs assistance Sitting-balance support: No upper extremity supported;Feet supported Sitting balance-Leahy Scale: Good     Standing balance support: No upper extremity supported;During functional activity Standing balance-Leahy Scale: Fair                               Pertinent Vitals/Pain Pain Assessment: Faces Faces Pain Scale: Hurts little more Pain Location: abdomen  Pain Descriptors / Indicators: Guarding Pain Intervention(s): Limited activity within patient's tolerance;Monitored during session;Repositioned    Home Living Family/patient expects to be discharged to:: Private residence Living Arrangements: Children Available Help at Discharge: Family;Available PRN/intermittently Type of Home: House Home Access: Ramped entrance     Home Layout: Two level Home Equipment: Walker - 2 wheels;Wheelchair - Sport and exercise psychologist Comments: Reports son is a Administrator and is not home most of the time.     Prior Function Level of Independence: Independent with assistive device(s)         Comments: Reports using WC for mobility outside of home and uses RW inside of home.      Hand Dominance        Extremity/Trunk Assessment   Upper Extremity Assessment Upper Extremity Assessment: Defer to OT evaluation    Lower Extremity Assessment Lower Extremity Assessment: Generalized weakness    Cervical / Trunk Assessment Cervical / Trunk Assessment: Kyphotic  Communication   Communication: No difficulties  Cognition Arousal/Alertness: Awake/alert Behavior During Therapy: WFL for tasks assessed/performed Overall Cognitive Status: Within Functional Limits for tasks assessed                                        General Comments      Exercises     Assessment/Plan    PT Assessment Patient needs continued PT services  PT Problem  List Decreased strength;Decreased balance;Decreased mobility;Decreased knowledge of use of DME;Decreased knowledge of precautions       PT Treatment Interventions DME instruction;Gait training;Functional mobility training;Stair training;Therapeutic activities;Therapeutic exercise;Balance training;Patient/family education    PT Goals (Current goals can be found in the Care Plan section)  Acute Rehab PT Goals Patient Stated Goal: to get stronger  PT Goal Formulation: With patient Time For Goal Achievement: 07/22/18 Potential to Achieve Goals: Good    Frequency Min 3X/week   Barriers to discharge Decreased caregiver support      Co-evaluation               AM-PAC PT "6 Clicks" Mobility  Outcome Measure Help needed turning from your back to your side while in a flat bed without using bedrails?: A Little Help needed moving from lying on your back to sitting on the side of a flat bed without using bedrails?: A Little Help needed moving to and from a bed to a chair (including a wheelchair)?: A Little Help needed standing up from a chair using your arms (e.g., wheelchair or bedside chair)?: A Little Help needed to walk in hospital room?: A Lot Help needed climbing 3-5 steps with a railing? : A Lot 6 Click Score: 16    End of Session   Activity Tolerance: Patient tolerated treatment well Patient left: in bed;with call bell/phone within reach;Other (comment)(respiratory therapist in room ) Nurse Communication: Mobility status PT Visit Diagnosis: Unsteadiness on feet (R26.81);Muscle weakness (generalized) (M62.81)    Time: 7893-8101 PT Time Calculation (min) (ACUTE ONLY): 11 min   Charges:   PT Evaluation $PT Eval Low Complexity: [place], PT, DPT  Acute Rehabilitation Services  Pager: [phone] Office: [phone]   Rudean Hitt 07/08/2018, 9:18 AM

## 2018-07-08 NOTE — Progress Notes (Signed)
[facility] Progress Note     Subjective: CC-  Patient states that she feels a little better than yesterday, but still has some upper abdominal discomfort. She also reports some persistent bloating. Denies n/v. Tolerated a soft diet for dinner but did not eat very much. Passing flatus. BM x2 yesterday.  Lives at home with her son but he is a Administrator and is not typically home.  Objective: Vital signs in last 24 hours: Temp:  [99 F (37.2 C)-99.1 F (37.3 C)] 99.1 F (37.3 C) (03/28 0507) Pulse Rate:  [83-98] 83 (03/28 0507) Resp:  [18-22] 18 (03/28 0507) BP: (129-138)/(59-65) 138/64 (03/28 0507) SpO2:  [95 %-100 %] 98 % (03/28 0507) Last BM Date: 07/07/18  Intake/Output from previous day: No intake/output data recorded. Intake/Output this shift: No intake/output data recorded.  PE: Gen:  Alert, NAD, pleasant HEENT: EOM's intact, pupils equal and round Card:  RRR Pulm:  Trace expiratory wheezing, no rhonchi, effort normal Abd: Soft, mild distension, +BS, TTP of epigastric region with guarding, no peritonitis  Skin: no rashes noted, warm and dry  Lab Results:  Recent Labs    07/06/18 0336  WBC 11.4*  HGB 13.1  HCT 40.7  PLT 278   BMET Recent Labs    07/06/18 0336 07/07/18 0208  NA 134* 138  K 3.8 3.8  CL 95* 100  CO2 26 26  GLUCOSE 191* 144*  BUN 5* <5*  CREATININE 0.85 0.74  CALCIUM 9.0 8.0*   PT/INR No results for input(s): LABPROT, INR in the last 72 hours. CMP     Component Value Date/Time   NA 138 07/07/2018 0208   K 3.8 07/07/2018 0208   CL 100 07/07/2018 0208   CO2 26 07/07/2018 0208   GLUCOSE 144 (H) 07/07/2018 0208   BUN <5 (L) 07/07/2018 0208   CREATININE 0.74 07/07/2018 0208   CALCIUM 8.0 (L) 07/07/2018 0208   PROT 7.2 07/06/2018 0336   ALBUMIN 3.6 07/06/2018 0336   AST 31 07/06/2018 0336   ALT 14 07/06/2018 0336   ALKPHOS 48 07/06/2018 0336   BILITOT 0.5 07/06/2018 0336   GFRNONAA >60 07/07/2018 0208   GFRAA  >60 07/07/2018 0208   Lipase     Component Value Date/Time   LIPASE 36 07/06/2018 0336       Studies/Results: Dg Abd Portable 1v-small Bowel Obstruction Protocol-initial, 8 Hr Delay  Result Date: 07/06/2018 CLINICAL DATA:  Follow-up examination for small bowel obstruction. EXAM: PORTABLE ABDOMEN - 1 VIEW COMPARISON:  Prior radiograph and CT from earlier the same day. FINDINGS: Nasogastric tube has been removed and is no longer visualized. Few scattered nondilated gas-filled loops of small bowel seen within the mid and left lower abdomen, measuring up to 2.8 cm in diameter. Overall degree of small bowel dilatation felt to be likely slightly decreased as compared to previous. Scattered enteric contrast material present within the stomach as well as scattered loops of small and large bowel. Overall, appearance is likely somewhat improved as compared to prior CT. No other new or progressive finding. IMPRESSION: Persistent but mildly improved appearance of ongoing small bowel obstruction as compared to prior CT from earlier the same day. Electronically Signed   By: Jeannine Boga M.D.   On: 07/06/2018 19:40   Dg Abd Portable 1v-small Bowel Protocol-position Verification  Result Date: 07/06/2018 CLINICAL DATA:  NG tube placement EXAM: PORTABLE ABDOMEN - 1 VIEW COMPARISON:  CT abdomen 07/06/2018 FINDINGS: Nasogastric tube with the tip projecting over the  stomach and the proximal port at the esophagogastric junction. Recommend advancing the nasogastric tube 8 cm. There is no bowel dilatation to suggest obstruction. There is no evidence of pneumoperitoneum, portal venous gas or pneumatosis. There are no pathologic calcifications along the expected course of the ureters. There is a 2 cm left lower lobe pulmonary nodule. The osseous structures are unremarkable. IMPRESSION: 1. Nasogastric tube with the tip projecting over the stomach and the proximal port at the esophagogastric junction. Recommend  advancing the nasogastric tube 8 cm. 2. 2 cm left lower lobe pulmonary nodule concerning for metastatic disease. 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 Patient states she is aware of this and has been followed for this by her oncologist in Dorma Russell MD. She has continued to follow with her reportedly since moving to [place] 2 years ago. Will have the patient follow up with her for this. Asthma - PRN Duonebs  SBO - CT with high grade SBO.  - SBO protocol showed contrast in colon - Mobilize for bowel function - had a BM and flatus  ID -None VTE -SCD, Lovenox FEN - soft diet Foley -None Follow up -TBD. Oncologist, Gustavo Lah MD  Plan: Tolerating diet and having bowel function but she is still somewhat distended and tender. Will check abdominal film. Mobilize. Encourage IS. Home meds reordered. Patient lives with son but he is not home often, will ask PT to see.   LOS: 2 days    Charlene Houston , [facility] 07/08/2018, 7:56 AM Pager: [phone] Mon-Thurs 7:00 am-4:30 pm Fri 7:00 am -11:30 AM Sat-Sun 7:00 am-11:30 am

## 2018-07-09 LAB — GLUCOSE, CAPILLARY
GLUCOSE-CAPILLARY: 112 mg/dL — AB (ref 70–99)
Glucose-Capillary: 184 mg/dL — ABNORMAL HIGH (ref 70–99)
Glucose-Capillary: 120 mg/dL — ABNORMAL HIGH (ref 70–99)
Glucose-Capillary: 183 mg/dL — ABNORMAL HIGH (ref 70–99)

## 2018-07-09 NOTE — Evaluation (Signed)
Occupational Therapy Evaluation Patient Details Name: Charlene Houston MRN: [id number] DOB: [birth year] Today's Date: 07/09/2018    History of Present Illness Pt is a 72 y/o female admitted secondary to worsening abdominal pain. Found to have SBO. PMH includes asthma, breast cancer, HTN, and DM.    Clinical Impression   PTA, pt was living with her son and was independent using a rollator. Pt currently requiring Min Guard-Min A for ADLs and functional mobility. Pt presenting with decreased activity tolerance as seen by fatigue and decreased SpO2. Pt dropping to 80s on RA and required 2L O2 during mobility as well as standing rest breaks. Pt very motivated to participate in therapy. Pt would benefit from further acute OT to facilitate safe dc. Discussed dc recommendation with SNF vs home with [place]. Pt reporting she would like to discuss options with her daughter.  Recommend dc to SNF for further OT to optimize safety, independence with ADLs, and return to PLOF. However, she may progress to home with [place].     Follow Up Recommendations  SNF;Supervision/Assistance - 24 hour(May progress to home with [facility])    Equipment Recommendations  None recommended by OT    Recommendations for Other Services PT consult     Precautions / Restrictions Precautions Precautions: Fall Restrictions Weight Bearing Restrictions: No      Mobility Bed Mobility Overal bed mobility: Needs Assistance Bed Mobility: Sit to Supine       Sit to supine: Supervision   General bed mobility comments: supervision for safety. No physical A needed  Transfers Overall transfer level: Needs assistance Equipment used: Rolling walker (2 wheeled) Transfers: Sit to/from [facility] Sit to Stand: Min guard         General transfer comment: Min Guard A for safety    Balance Overall balance assessment: Needs assistance Sitting-balance support: No upper extremity supported;Feet  supported Sitting balance-Leahy Scale: Good     Standing balance support: No upper extremity supported;During functional activity Standing balance-Leahy Scale: Fair                             ADL either performed or assessed with clinical judgement   ADL Overall ADL's : Needs assistance/impaired Eating/Feeding: Independent;Sitting   Grooming: Wash/dry hands;Wash/dry face;Standing;Min guard   Upper Body Bathing: Supervision/ safety;Set up;Sitting   Lower Body Bathing: Min guard;Sit to/from stand   Upper Body Dressing : Set up;Supervision/safety;Sitting   Lower Body Dressing: Min guard;Sit to/from stand;Minimal assistance   Toilet Transfer: Min guard;Ambulation;Regular Toilet;Grab bars;RW   Toileting- Clothing Manipulation and Hygiene: Supervision/safety;Sitting/lateral lean       Functional mobility during ADLs: Min guard;Minimal assistance;Rolling walker General ADL Comments: Pt performing ADLs and functional mobility at Min A level.      Vision         Perception     Praxis      Pertinent Vitals/Pain Pain Assessment: Faces Faces Pain Scale: Hurts a little bit Pain Location: abdomen  Pain Descriptors / Indicators: Guarding Pain Intervention(s): Monitored during session;Limited activity within patient's tolerance;Repositioned     Hand Dominance Right   Extremity/Trunk Assessment Upper Extremity Assessment Upper Extremity Assessment: Overall WFL for tasks assessed   Lower Extremity Assessment Lower Extremity Assessment: Defer to PT evaluation   Cervical / Trunk Assessment Cervical / Trunk Assessment: Kyphotic;Other exceptions Cervical / Trunk Exceptions: Increased body habitus   Communication Communication Communication: No difficulties   Cognition Arousal/Alertness: Awake/alert Behavior During Therapy: WFL for  tasks assessed/performed Overall Cognitive Status: Within Functional Limits for tasks assessed                                  General Comments: Motivated to participate in therapy   General Comments  Pt presenting with decreased activity [place]. SpO2 88% on RA while seated in recliner. Placing pt on 2L O2 for activity (toileting and mobility in hallway), and SpO2 dropped to 86% during mobility. Pt requiring standing rest break to elevate SpO2 >90%. In bed, pt SpO2 91% and she requested to return to RA. Notified RN.     Exercises     Shoulder Instructions      Home Living Family/patient expects to be discharged to:: Private residence Living Arrangements: Children Available Help at Discharge: Family;Available PRN/intermittently Type of Home: House Home Access: Ramped entrance     Home Layout: Two level Alternate Level Stairs-Number of Steps: flight  Alternate Level Stairs-Rails: Right Bathroom Shower/Tub: Teacher, early years/pre: Standard     Home Equipment: Environmental consultant - 2 wheels;Wheelchair - manual;Tub bench   Additional Comments: Reports son is a Administrator and is not home most of the time.       Prior Functioning/Environment Level of Independence: Independent with assistive device(s)        Comments: Reports using WC for mobility outside of home and uses RW inside of home.         OT Problem List: Decreased strength;Decreased range of motion;Decreased activity tolerance;Impaired balance (sitting and/or standing);Decreased safety awareness;Decreased knowledge of use of DME or AE;Decreased knowledge of precautions;Pain      OT Treatment/Interventions: Self-care/ADL training;Therapeutic exercise;Energy conservation;DME and/or AE instruction;Therapeutic activities;Patient/family education    OT Goals(Current goals can be found in the care plan section) Acute Rehab OT Goals Patient Stated Goal: to get stronger  OT Goal Formulation: With patient Time For Goal Achievement: 07/23/18 Potential to Achieve Goals: Good  OT Frequency: Min 3X/week   Barriers to D/C:             Co-evaluation              AM-PAC OT "6 Clicks" Daily Activity     Outcome Measure Help from another person eating meals?: None Help from another person taking care of personal grooming?: A Little Help from another person toileting, which includes using toliet, bedpan, or urinal?: A Little Help from another person bathing (including washing, rinsing, drying)?: A Little Help from another person to put on and taking off regular upper body clothing?: None Help from another person to put on and taking off regular lower body clothing?: A Little 6 Click Score: 20   End of Session Equipment Utilized During Treatment: Gait belt;Rolling walker;Oxygen Nurse Communication: Mobility status;Other (comment)(SpO2)  Activity Tolerance: Patient tolerated treatment well;Patient limited by fatigue Patient left: in bed;with call bell/phone within reach;with bed alarm set  OT Visit Diagnosis: Unsteadiness on feet (R26.81);Other abnormalities of gait and mobility (R26.89);Muscle weakness (generalized) (M62.81);Other symptoms and signs involving cognitive function;Pain Pain - part of body: (abdomen)                Time: 8099-8338 OT Time Calculation (min): 23 min Charges:  OT General Charges $OT Visit: 1 Visit OT Evaluation $OT Eval Moderate Complexity: 1 Mod OT Treatments $Self Care/Home Management : 8-22 mins  Mary [place] MSOT, OTR/L Acute Rehab Pager: [phone] Office: [place] 07/09/2018, 3:57 PM

## 2018-07-09 NOTE — Progress Notes (Signed)
   Subjective/Chief Complaint: Feeling better Tolerating soft diet   Objective: Vital signs in last 24 hours: Temp:  [98.7 F (37.1 C)-99.7 F (37.6 C)] 98.7 F (37.1 C) (03/29 0446) Pulse Rate:  [78-91] 78 (03/29 0446) Resp:  [17-18] 18 (03/29 0446) BP: (105-131)/(56-67) 131/66 (03/29 0446) SpO2:  [95 %-100 %] 99 % (03/29 0446) Last BM Date: 07/07/18  Intake/Output from previous day: 03/28 0701 - 03/29 0700 In: 24 [P.O.:570] Out: -  Intake/Output this shift: Total I/O In: 225 [P.O.:225] Out: -   Exam: Up in a chair Awake and alert Abdomen soft, obese, non-distended, non-tender  Lab Results:  No results for input(s): WBC, HGB, HCT, PLT in the last 72 hours. BMET Recent Labs    07/07/18 0208  NA 138  K 3.8  CL 100  CO2 26  GLUCOSE 144*  BUN <5*  CREATININE 0.74  CALCIUM 8.0*   PT/INR No results for input(s): LABPROT, INR in the last 72 hours. ABG No results for input(s): PHART, HCO3 in the last 72 hours.  Invalid input(s): PCO2, PO2  Studies/Results: Dg Abd Portable 1v  Result Date: 07/08/2018 CLINICAL DATA:  Small bowel obstruction. Hypertension. Diabetes. Ex-smoker. EXAM: PORTABLE ABDOMEN - 1 VIEW COMPARISON:  07/06/2018 FINDINGS: Single supine view of the abdomen and pelvis. Contrast has passed into the colon, with a small amount of contrast remaining in the sigmoid and distal transverse segments. The small bowel distension has resolved. Upper abdomen partially excluded. No gross free intraperitoneal air. IMPRESSION: Resolution of small-bowel dilatation. Electronically Signed   By: Abigail Miyamoto M.D.   On: 07/08/2018 09:46    Anti-infectives: Anti-infectives (From admission, onward)   None      Assessment/Plan: HTN- home meds IDDM - SSI - HgbA1c7.1 Hx of Breast Cancer - S/p chemo/radiation and right lumpectomy and axillary lymph node dissection in 2007 of the right breast - CT with lower lobe nodules, including a 2 cm nodule on the  left-likely metastatic disease in this patient with history of breast cancer. Patient states she is aware of this and has been followed for this by her oncologist in Dorma Russell MD. She has continued to follow with her reportedly since moving to [place] 2 years ago. Will have the patient follow up with her for this. Asthma- PRN Duonebs  SBO - CT with high grade SBO.  - SBO protocolshowed contrast in colon - Mobilize for bowel function - had a BM and flatus  ID -None VTE -SCD, Lovenox FEN - soft diet Foley -None Follow up -TBD. Oncologist, Gustavo Lah MD  Plan: continues to tolerate soft diet.  Working with PT.  Home with [place] vs SNF depending on her progress   LOS: 3 days    Charlene Houston 07/09/2018

## 2018-07-10 ENCOUNTER — Inpatient Hospital Stay (HOSPITAL_COMMUNITY): Payer: Medicare Other

## 2018-07-10 DIAGNOSIS — K565 Intestinal adhesions [bands], unspecified as to partial versus complete obstruction: Secondary | ICD-10-CM

## 2018-07-10 LAB — BASIC METABOLIC PANEL
Anion gap: 9 (ref 5–15)
BUN: <5 mg/dL — ABNORMAL LOW (ref 8–23)
CO2: 30 mmol/L (ref 22–32)
CREATININE: 0.76 mg/dL (ref 0.44–1.00)
Calcium: 8.8 mg/dL — ABNORMAL LOW (ref 8.9–10.3)
Chloride: 97 mmol/L — ABNORMAL LOW (ref 98–111)
GFR calc Af Amer: >60 mL/min (ref >60)
GFR calc non Af Amer: >60 mL/min (ref >60)
Glucose, Bld: 214 mg/dL — ABNORMAL HIGH (ref 70–99)
Potassium: 3.6 mmol/L (ref 3.5–5.1)
Sodium: 136 mmol/L (ref 135–145)

## 2018-07-10 LAB — GLUCOSE, CAPILLARY
GLUCOSE-CAPILLARY: 128 mg/dL — AB (ref 70–99)
Glucose-Capillary: 146 mg/dL — ABNORMAL HIGH (ref 70–99)
Glucose-Capillary: 144 mg/dL — ABNORMAL HIGH (ref 70–99)
Glucose-Capillary: 236 mg/dL — ABNORMAL HIGH (ref 70–99)

## 2018-07-10 LAB — CBC
HCT: 38.1 % (ref 36.0–46.0)
Hemoglobin: 11.6 g/dL — ABNORMAL LOW (ref 12.0–15.0)
MCH: 28.6 pg (ref 26.0–34.0)
MCHC: 30.4 g/dL (ref 30.0–36.0)
MCV: 94.1 fL (ref 80.0–100.0)
Platelets: 215 10*3/uL (ref 150–400)
RBC: 4.05 MIL/uL (ref 3.87–5.11)
RDW: 11.6 % (ref 11.5–15.5)
WBC: 7.6 10*3/uL (ref 4.0–10.5)
nRBC: 0 % (ref 0.0–0.2)

## 2018-07-10 LAB — URINALYSIS, ROUTINE W REFLEX MICROSCOPIC
Bilirubin Urine: NEGATIVE
Glucose, UA: NEGATIVE mg/dL
Hgb urine dipstick: NEGATIVE
Ketones, ur: NEGATIVE mg/dL
Leukocytes,Ua: NEGATIVE
Nitrite: NEGATIVE
PROTEIN: NEGATIVE mg/dL
Specific Gravity, Urine: 1.018 (ref 1.005–1.030)
pH: 5 (ref 5.0–8.0)

## 2018-07-10 LAB — BRAIN NATRIURETIC PEPTIDE: B Natriuretic Peptide: 62.3 pg/mL (ref 0.0–100.0)

## 2018-07-10 MED ORDER — MOMETASONE FURO-FORMOTEROL FUM 200-5 MCG/ACT IN AERO
2.0000 | INHALATION_SPRAY | Freq: Two times a day (BID) | RESPIRATORY_TRACT | Status: DC
  Administered 2018-07-10 – 2018-07-11 (×2): 2 via RESPIRATORY_TRACT
  Filled 2018-07-10: qty 8.8

## 2018-07-10 MED ORDER — FUROSEMIDE 10 MG/ML IJ SOLN
20.0000 mg | Freq: Once | INTRAMUSCULAR | Status: AC
  Administered 2018-07-10: 20 mg via INTRAVENOUS
  Filled 2018-07-10: qty 2

## 2018-07-10 NOTE — Progress Notes (Signed)
{  PT SATURATION QUALIFICATIONS: (This note is used to comply with regulatory documentation for home oxygen)  Patient Saturations on Room Air at Rest = 88%  Patient Saturations on Room Air while Ambulating = 80%  Patient Saturations on 2 Liters of oxygen while Ambulating = 93%  Please briefly explain why patient needs home oxygen: Patient is unable to maintain SpO2 >88% on RA without supplemental O2.   Earney Navy, PTA Acute Rehabilitation Services Pager: [phone] Office: [phone]

## 2018-07-10 NOTE — Progress Notes (Signed)
Physical Therapy Treatment Patient Details Name: Charlene Houston MRN: [id number] DOB: January 26, 1946 Today's Date: 07/10/2018    History of Present Illness Pt is a 73 y/o female admitted secondary to worsening abdominal pain. Found to have SBO. PMH includes asthma, breast cancer, HTN, and DM.     PT Comments    Patient seen for mobility progression. Pt is making progress toward PT goals and tolerated increased mobility this session. Pt requires min guard/min A for gait/stair training. Pt with c/o dizziness with stair training. SpO2 desat on RA- see walking saturations qualification note. Current plan remains appropriate.    Follow Up Recommendations  SNF;Supervision for mobility/OOB     Equipment Recommendations  3in1 (PT)    Recommendations for Other Services OT consult     Precautions / Restrictions Precautions Precautions: Fall Restrictions Weight Bearing Restrictions: No    Mobility  Bed Mobility               General bed mobility comments: pt OOB in chair upon arrival  Transfers Overall transfer level: Needs assistance Equipment used: Rolling walker (2 wheeled) Transfers: Sit to/from Stand Sit to Stand: Supervision         General transfer comment: supervision for safety  Ambulation/Gait Ambulation/Gait assistance: Min guard Gait Distance (Feet): (70 ft then 30 ft with rest break required due to SOB) Assistive device: Rolling walker (2 wheeled) Gait Pattern/deviations: Step-through pattern;Decreased stride length Gait velocity: decreased   General Gait Details: cues for breathing technique; pt is unable to take deep breaths without coughing   Stairs Stairs: Yes Stairs assistance: Min assist Stair Management: One rail Right;Step to pattern;Sideways;Forwards Number of Stairs: 10 General stair comments: increased assistance required to descend stairs; pt c/o dizziness and SpO2 desat to 88% on 2L O2 via [employer] after ascending steps; cues for  sequencing and technique for safety/energy conservation   Wheelchair Mobility    Modified Rankin (Stroke Patients Only)       Balance Overall balance assessment: Needs assistance Sitting-balance support: No upper extremity supported;Feet supported Sitting balance-Leahy Scale: Good     Standing balance support: No upper extremity supported;During functional activity Standing balance-Leahy Scale: Fair                              Cognition Arousal/Alertness: Awake/alert Behavior During Therapy: WFL for tasks assessed/performed Overall Cognitive Status: Within Functional Limits for tasks assessed                                        Exercises      General Comments        Pertinent Vitals/Pain Pain Assessment: No/denies pain    Home Living                      Prior Function            PT Goals (current goals can now be found in the care plan section) Acute Rehab PT Goals Patient Stated Goal: to get stronger  Progress towards PT goals: Progressing toward goals    Frequency    Min 3X/week      PT Plan Current plan remains appropriate    Co-evaluation              AM-PAC PT "6 Clicks" Mobility   Outcome Measure  Help needed turning from  your back to your side while in a flat bed without using bedrails?: A Little Help needed moving from lying on your back to sitting on the side of a flat bed without using bedrails?: A Little Help needed moving to and from a bed to a chair (including a wheelchair)?: A Little Help needed standing up from a chair using your arms (e.g., wheelchair or bedside chair)?: A Little Help needed to walk in hospital room?: A Little Help needed climbing 3-5 steps with a railing? : A Little 6 Click Score: 18    End of Session Equipment Utilized During Treatment: Gait belt;Oxygen Activity Tolerance: Patient tolerated treatment well Patient left: with call bell/phone within reach;in  chair Nurse Communication: Mobility status PT Visit Diagnosis: Unsteadiness on feet (R26.81);Muscle weakness (generalized) (M62.81)     Time: 1388-7195 PT Time Calculation (min) (ACUTE ONLY): 27 min  Charges:  $Gait Training: 23-37 mins                     Earney Navy, PTA Acute Rehabilitation Services Pager: [phone] Office: [phone]     Darliss Cheney 07/10/2018, 4:24 PM

## 2018-07-10 NOTE — Consult Note (Addendum)
Medical Consultation   Charlene Houston  UGQ:[id number]  DOB: 1945-05-24  DOA: 07/06/2018  PCP: Derwood Kaplan, MD Requesting physician: [facility]  Impression/Recommendations Active Problems:    Hypoxia sats dropped into the 80s and needed 2 L Baseline history of OSA Used to smoke ??  COPD maybe 5 years ago.  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 and probably sign off  Reason for consultation: Hypoxia to 85% Given 20 of lasix,   History of Present Illness:  73 year old female Prior breast cancer status post lumpectomy and then lymph node removal previously on chemo XRT 5-year history?  Asthma  HTN DM TY 2, OSA on CPAP [follows with [facility] last seen 12/20/2017] Came into the hospital for SBO on 3/26 WBC was 11 CT showed SBO transition point She is nearing d/c but hypoxia was noted CXR showed casrdiomegally, vasc congestion, bnp 62  Consult was requested because patient started to desaturate into the low 90s She has not had any fever she has occasional cough and she needed 2 L of oxygen  Past Medical History: Past Medical History:  Diagnosis Date  . Arthritis   . Asthma   . Breast cancer ([place])   . Diabetes mellitus without complication ([place])   . Hypertension   . SBO (small bowel obstruction) ([place]) 06/2018     Past Surgical History: Past Surgical History:  Procedure Laterality Date  . ABDOMINAL HYSTERECTOMY    . THUMB AMPUTATION Left    PARTIAL  . TONSILLECTOMY       Allergies:   Allergies  Allergen Reactions  . Prozac [Fluoxetine Hcl] Other (See Comments)    Gittery  . Vit D-Vit E-Safflower Oil Other (See Comments)    Not specified     Social History:  reports that she has quit smoking. She has never used smokeless tobacco. She reports previous alcohol use. She reports previous drug use.   Family History: History reviewed. No pertinent family history.  Physical Exam: Vitals:   07/09/18 1631 07/09/18 2152 07/10/18 0505 07/10/18 0622  BP:  124/66 128/68   Pulse:  73 75   Resp:  18 18   Temp:  98.3 F (36.8 C) 98 F (36.7 C)   TempSrc:  Oral Oral   SpO2: 96% 100% 100% 94%  Weight:      Height:         Data reviewed:  I have personally reviewed following labs and imaging studies Labs:  CBC: Recent Labs  Lab 07/06/18 0336 07/10/18 1117  WBC 11.4* 7.6  NEUTROABS 7.6  --   HGB 13.1 11.6*  HCT 40.7 38.1  MCV 92.9 94.1  PLT 278 882    Basic Metabolic Panel: Recent Labs  Lab 07/06/18 0336 07/06/18  3299 07/07/18 0208  NA 134*  --  138  K 3.8  --  3.8  CL 95*  --  100  CO2 26  --  26  GLUCOSE 191*  --  144*  BUN 5*  --  <5*  CREATININE 0.85  --  0.74  CALCIUM 9.0  --  8.0*  MG  --  1.6* 2.0   GFR Estimated Creatinine Clearance: 78.5 mL/min (by C-G formula based on SCr of 0.74 mg/dL). Liver Function Tests: Recent Labs  Lab 07/06/18 0336  AST 31  ALT 14  ALKPHOS 48  BILITOT 0.5  PROT 7.2  ALBUMIN 3.6   Recent Labs  Lab 07/06/18 0336  LIPASE 36   No results for input(s): AMMONIA in the last 168 hours. Coagulation profile No results for input(s): INR, PROTIME in the last 168 hours.  Cardiac Enzymes: No results for input(s): CKTOTAL, CKMB, CKMBINDEX, TROPONINI in the last 168 hours. BNP: Invalid input(s): POCBNP CBG: Recent Labs  Lab  07/09/18 0734 07/09/18 1154 07/09/18 1557 07/09/18 2151 07/10/18 0824  GLUCAP 112* 184* 120* 183* 128*   D-Dimer No results for input(s): DDIMER in the last 72 hours. Hgb A1c No results for input(s): HGBA1C in the last 72 hours. Lipid Profile No results for input(s): CHOL, HDL, LDLCALC, TRIG, CHOLHDL, LDLDIRECT in the last 72 hours. Thyroid function studies No results for input(s): TSH, T4TOTAL, T3FREE, THYROIDAB in the last 72 hours.  Invalid input(s): FREET3 Anemia work up No results for input(s): VITAMINB12, FOLATE, FERRITIN, TIBC, IRON, RETICCTPCT in the last 72 hours. Urinalysis    Component Value Date/Time   COLORURINE YELLOW 07/10/2018 0859   APPEARANCEUR HAZY (A) 07/10/2018 0859   LABSPEC 1.018 07/10/2018 0859   PHURINE 5.0 07/10/2018 0859   GLUCOSEU NEGATIVE 07/10/2018 0859   HGBUR NEGATIVE 07/10/2018 0859   BILIRUBINUR NEGATIVE 07/10/2018 0859   KETONESUR NEGATIVE 07/10/2018 0859   PROTEINUR NEGATIVE 07/10/2018 0859   NITRITE NEGATIVE 07/10/2018 0859   LEUKOCYTESUR NEGATIVE 07/10/2018 0859     Sepsis Labs Invalid input(s): PROCALCITONIN,  WBC,  LACTICIDVEN Microbiology No results found for this or any previous visit (from the past 240 hour(s)).     Inpatient Medications:   Scheduled Meds: . enoxaparin (LOVENOX) injection  40 mg Subcutaneous Q24H  . escitalopram  5 mg Oral Daily  . furosemide  20 mg Intravenous Once  . gabapentin  300 mg Oral TID AC & HS  . hydrochlorothiazide  12.5 mg Oral Daily  . insulin aspart  0-15 Units Subcutaneous TID WC  . insulin detemir  5 Units Subcutaneous QHS  . lisinopril  20 mg Oral Daily  . mirtazapine  15 mg Oral QHS  . pantoprazole  40 mg Oral QHS  . pravastatin  40 mg Oral Daily   Continuous Infusions: . sodium chloride 10 mL/hr at 07/08/18 0932     Radiological Exams on Admission: Dg Chest 2 View  Result Date: 07/10/2018 CLINICAL DATA:  73 year old female with a history of pulmonary nodule EXAM: CHEST -  2 VIEW COMPARISON:  Prior chest x-ray an acute abdominal series 07/06/2018; CT scan of the abdomen and pelvis including the lung bases 07/06/2018 FINDINGS: Cardiopericardial silhouette is mildly enlarged. Mild pulmonary vascular congestion with diffuse interstitial prominence. Suggestion of a posterior left lower lobe pulmonary nodule on the lateral view. This is not well seen on the frontal view secondary to patient body habitus and underlying lung disease. IMPRESSION: 1. Left lower lobe pulmonary nodule better visualized on recent prior  CT scan of the abdomen and pelvis. 2. Cardiomegaly. 3. Pulmonary vascular congestion and chronic bronchitic changes without overt pulmonary edema. Electronically Signed   By: Jacqulynn Cadet M.D.   On: 07/10/2018 11:02    Impression/Recommendations Active Problems:   SBO (small bowel obstruction) ([place])   Thank you for this consultation.  Our [facility] hospitalist team will follow the patient with you.   Time Spent: 93  Nita Sells M.D. [facility] 07/10/2018, 11:47 AM

## 2018-07-10 NOTE — Progress Notes (Signed)
CC: SBO  Subjective: PT note 3/28 skilled nursing facility/supervision for mobility/out of bed OT note 3/29 and half; supervision assistance-24 hours progressed to home with home health OT Patient's intermittently dropping her saturations.  She has an incentive spirometer which she can only move about 250 before she starts coughing which is somewhat productive although she cannot seem to clear it.  She has a pure wick on and says she cannot get to the bathroom in time to void on her own.  She is hoping to go home and be by herself.  Objective: Vital signs in last 24 hours: Temp:  [98 F (36.7 C)-98.7 F (37.1 C)] 98 F (36.7 C) (03/30 0505) Pulse Rate:  [73-90] 75 (03/30 0505) Resp:  [16-18] 18 (03/30 0505) BP: (124-128)/(61-68) 128/68 (03/30 0505) SpO2:  [86 %-100 %] 94 % (03/30 0622) Last BM Date: 07/09/18 225 PO Urine 250 Stool x 2 Afebrile, VSS No labs since 3/27 Abdominal film 07/08/2018: Resolution of small bowel obstruction contrast in the colon Intake/Output from previous day: 03/29 0701 - 03/30 0700 In: 225 [P.O.:225] Out: 250 [Urine:250] Intake/Output this shift: No intake/output data recorded.  General appearance: alert, cooperative and no distress Resp: She sounds congested.  She is coughing.  Is intermittently dropping her sats on room air and having oxygen placed she can only move to 50 on the I-S without coughing. GI: Soft, positive bowel sounds, tolerating soft diet, positive BM.  Lab Results:  No results for input(s): WBC, HGB, HCT, PLT in the last 72 hours.  BMET No results for input(s): NA, K, CL, CO2, GLUCOSE, BUN, CREATININE, CALCIUM in the last 72 hours. PT/INR No results for input(s): LABPROT, INR in the last 72 hours.  Recent Labs  Lab 07/06/18 0336  AST 31  ALT 14  ALKPHOS 48  BILITOT 0.5  PROT 7.2  ALBUMIN 3.6     Lipase     Component Value Date/Time   LIPASE 36 07/06/2018 0336     Medications: . enoxaparin (LOVENOX)  injection  40 mg Subcutaneous Q24H  . escitalopram  5 mg Oral Daily  . gabapentin  300 mg Oral TID AC & HS  . hydrochlorothiazide  12.5 mg Oral Daily  . insulin aspart  0-15 Units Subcutaneous TID WC  . insulin detemir  5 Units Subcutaneous QHS  . lisinopril  20 mg Oral Daily  . mirtazapine  15 mg Oral QHS  . pantoprazole  40 mg Oral QHS  . pravastatin  40 mg Oral Daily    Assessment/Plan HTN- home meds IDDM - SSI - HgbA1c7.1 Hx of Breast Cancer - S/p chemo/radiation and right lumpectomy and axillary lymph node dissection in 2007 of the right breast - CT with lower lobe nodules, including a 2 cm nodule on the left-likely metastatic disease in this patient with history of breast cancer. Patient states she is aware of this and has been followed for this by her oncologist in Dorma Russell MD. She has continued to follow with her reportedly since moving to [place] 2 years ago. Will have the patient follow up with her for this. Asthma- PRN Duonebs O2 desaturations with cough Urinary incontinence.    SBO/Hx abdominal hysterectomy - CT with high grade SBO.  - SBO protocolshowed contrast in colon - Mobilize for bowel function - had a BM and flatus  ID -None VTE -SCD, Lovenox FEN - soft diet Foley -None Follow up -TBD. Oncologist, Gustavo Lah MD  Plan:  I think she is  doing well from her SBO, but she is an old smoker, with pulmonary congestion.  I will check a CXR and urine on her ASAP.  I do not think she is ready to be home alone.         LOS: 4 days    Mikelle Myrick 07/10/2018 [id number]

## 2018-07-10 NOTE — Progress Notes (Signed)
Occupational Therapy Treatment Patient Details Name: Charlene Houston MRN: [id number] DOB: 1945/10/22 Today's Date: 07/10/2018    History of present illness Pt is a 73 y/o female admitted secondary to worsening abdominal pain. Found to have SBO. PMH includes asthma, breast cancer, HTN, and DM.    OT comments  Pt progressing well towards established OT goals and is motivated to participate in therapy. Pt performing grooming at sink with supervision. SpO2 dropping to 83% on RA during activity. Placing pt on 2L O2 and she maintained SpO2 94-98% during mobility. Pt requiring Min Guard A for functional mobility. Continues to present with fatigue during ADLs and mobility. At end of session, calling pt's daughter to discuss dc recommendation. Discussed how pt may need home O2, will need to be able to manage stairs safely, and will need assist with IADLs due to fatigue. Continue to recommend SNF for post-acute rehab and will continue to monitor progress for possible dc home with HHOT.    Follow Up Recommendations  SNF;Supervision/Assistance - 24 hour(May progress to home with [facility])    Equipment Recommendations  None recommended by OT    Recommendations for Other Services PT consult    Precautions / Restrictions Precautions Precautions: Fall Restrictions Weight Bearing Restrictions: No       Mobility Bed Mobility Overal bed mobility: Needs Assistance Bed Mobility: Sit to Supine     Supine to sit: Supervision;HOB elevated Sit to supine: Supervision   General bed mobility comments: supervision for safety. No physical A needed  Transfers Overall transfer level: Needs assistance Equipment used: Rolling walker (2 wheeled) Transfers: Sit to/from Stand Sit to Stand: Min guard         General transfer comment: Min Guard A for safety    Balance Overall balance assessment: Needs assistance Sitting-balance support: No upper extremity supported;Feet supported Sitting  balance-Leahy Scale: Good     Standing balance support: No upper extremity supported;During functional activity Standing balance-Leahy Scale: Fair                             ADL either performed or assessed with clinical judgement   ADL Overall ADL's : Needs assistance/impaired     Grooming: Oral care;Wash/dry face;Supervision/safety;Set up;Standing Grooming Details (indicate cue type and reason): Supervision for safety at sink Upper Body Bathing: Supervision/ safety;Set up;Sitting Upper Body Bathing Details (indicate cue type and reason): donning second gown like jacket             Toilet Transfer: Min guard;RW(simulated to recliner) [employer] forces technical officer Details (indicate cue type and reason): Min Guard A for safety         Functional mobility during ADLs: Min guard;Supervision/safety;Rolling walker General ADL Comments: Pt continues to present with decreased activity tolerance as seen by fatigue and SPO2. Pt with increased tolerance today and walking further this session. SpO2 dropping to 80s on RA during activity. PLaced on 2L     Vision       Perception     Praxis      Cognition Arousal/Alertness: Awake/alert Behavior During Therapy: WFL for tasks assessed/performed Overall Cognitive Status: Within Functional Limits for tasks assessed                                 General Comments: Motivated to participate in therapy        Exercises     Shoulder Instructions  General Comments SpO2 88-90% while supine in bed on RA. Dropping to 83% on RA during grooming at sink. Placed pt on 2L O2 and maintain SpO2 94-98% on 2L. Notified RN. Calling patient's daughter while with patient and discussed dc recommendation    Pertinent Vitals/ Pain       Pain Assessment: Faces Faces Pain Scale: Hurts a little bit Pain Location: abdomen  Pain Descriptors / Indicators: Guarding Pain Intervention(s): Monitored during session;Repositioned  Home  Living                                          Prior Functioning/Environment              Frequency  Min 3X/week        Progress Toward Goals  OT Goals(current goals can now be found in the care plan section)  Progress towards OT goals: Progressing toward goals  Acute Rehab OT Goals Patient Stated Goal: to get stronger  OT Goal Formulation: With patient Time For Goal Achievement: 07/23/18 Potential to Achieve Goals: Good ADL Goals Pt Will Perform Grooming: with modified independence;standing Pt Will Perform Lower Body Dressing: with modified independence;sit to/from stand Pt Will Transfer to Toilet: with modified independence;ambulating;regular height toilet Pt Will Perform Toileting - Clothing Manipulation and hygiene: with modified independence;sit to/from stand;sitting/lateral leans Additional ADL Goal #1: Pt will demonstrate increased activity tolerance to perform simple IADLs task in standing with supervision  Plan Discharge plan remains appropriate    Co-evaluation                 AM-PAC OT "6 Clicks" Daily Activity     Outcome Measure   Help from another person eating meals?: None Help from another person taking care of personal grooming?: A Little Help from another person toileting, which includes using toliet, bedpan, or urinal?: A Little Help from another person bathing (including washing, rinsing, drying)?: A Little Help from another person to put on and taking off regular upper body clothing?: None Help from another person to put on and taking off regular lower body clothing?: A Little 6 Click Score: 20    End of Session Equipment Utilized During Treatment: Rolling walker;Oxygen  OT Visit Diagnosis: Unsteadiness on feet (R26.81);Other abnormalities of gait and mobility (R26.89);Muscle weakness (generalized) (M62.81);Other symptoms and signs involving cognitive function;Pain Pain - part of body: (abdomen)   Activity Tolerance  Patient tolerated treatment well   Patient Left with call bell/phone within reach;in chair   Nurse Communication Mobility status;Other (comment)(SpO2)        Time: 7673-4193 OT Time Calculation (min): 29 min  Charges: OT General Charges $OT Visit: 1 Visit OT Treatments $Self Care/Home Management : 23-37 mins  [place], OTR/L Acute Rehab Pager: [phone] Office: [place] 07/10/2018, 9:44 AM

## 2018-07-10 NOTE — TOC Progression Note (Addendum)
Transition of Care [facility]) - Progression Note    Patient Details  Name: Kathlean Cinco MRN: [id number] Date of Birth: Jul 25, 1945  Transition of Care [facility]) CM/SW Contact  Treysen Sudbeck, Edson Snowball, RN Phone Number: 07/10/2018, 10:31 AM  Clinical Narrative:     1526 Patient has chosen [facility], per notes patient may be ready for discharge 07/11/18 . Called Harriet Pho at [facility], if patient medically ready 07/11/18 they can accept patient.  1138 presented offers to patient and Medicare.gov list. With patient's permission called daughter Cheryln Manly [phone] and placed on speaker phone . Both consented to NCM to email offers and Medicare.gov link to Sonya at [email or web address]. Patient and daughter will discuss offers. NCM will follow up .   Discussed PT recommendation for SNF. Patient in agreement. Patient would like [facility] . FL2 faxed  Expected Discharge Plan: [place] Barriers to Discharge: Continued Medical Work up  Expected Discharge Plan and Services Expected Discharge Plan: [place] Choice: [place] arrangements for the past 2 months: Single Family Home                   DME Agency: NA HH Arranged: NA HH Agency: NA   Social Determinants of Health (SDOH) Interventions    Readmission Risk Interventions No flowsheet data found.

## 2018-07-10 NOTE — Progress Notes (Signed)
PT Cancellation Note  Patient Details Name: Charlene Houston MRN: [id number] DOB: 09/26/1945   Cancelled Treatment:    Reason Eval/Treat Not Completed: Patient at procedure or test/unavailable Patient off unit for CXR. PT will continue to follow acutely.    Salina April, PTA Acute Rehabilitation Services Pager: [phone] Office: [phone]   07/10/2018, 10:42 AM

## 2018-07-10 NOTE — Progress Notes (Addendum)
SATURATION QUALIFICATIONS:  Patient Saturations on Room Air at Rest = 90-88%  Patient Saturations on Room Air while Ambulating = 83%  Patient Saturations on 2 Liters of oxygen while Ambulating = 98-94%  Please briefly explain why patient needs home oxygen: During activity including ADLs and functional mobility, pt dropping to low 80s on RA and reports dizziness. For safe mobility and performance of ADLs, pt will need 2L O2.  [place], OTR/L Acute Rehab Pager: [phone] Office: [phone]

## 2018-07-10 NOTE — NC FL2 (Signed)
[place] LEVEL OF CARE SCREENING TOOL     IDENTIFICATION  Patient Name: Charlene Houston Birthdate: [birth year] Sex: female Admission Date (Current Location): 07/06/2018  [facility] and [place] Number:  Herbalist and Address:  [facility] [place] [place] [address] [place], [place] [address]      Provider Number: [id number]  Attending Physician Name and Address:  Edison Pace, Md, MD  Relative Name and Phone Number:  Cheryln Manly [phone]    Current Level of Care: Hospital Recommended Level of Care:  [place] Prior Approval Number:    Date Approved/Denied:   PASRR Number: [id number]  Discharge Plan: SNF    Current Diagnoses: Patient Active Problem List   Diagnosis Date Noted  . SBO (small bowel obstruction) ([place]) 07/06/2018    Orientation RESPIRATION BLADDER Height & Weight     Self, Time, Situation, Place  O2(2L ) Incontinent Weight: 235 lb (106.6 kg) Height:  5\' 6"  (167.6 cm)  BEHAVIORAL SYMPTOMS/MOOD NEUROLOGICAL BOWEL NUTRITION STATUS      Continent Diet(soft )  AMBULATORY STATUS COMMUNICATION OF NEEDS Skin   Extensive Assist Verbally Normal                       Personal Care Assistance Level of Assistance  Bathing, Dressing, Feeding Bathing Assistance: Maximum assistance Feeding assistance: Independent Dressing Assistance: Maximum assistance     Functional Limitations Info  Sight, Hearing, Speech Sight Info: Adequate Hearing Info: Adequate Speech Info: Adequate    SPECIAL CARE FACTORS FREQUENCY  PT (By licensed PT)     PT Frequency: 5 times a week OT Frequency: 5 times a week            Contractures Contractures Info: Not present    Additional Factors Info  Psychotropic, Insulin Sliding Scale Code Status Info: full Allergies Info: Prozac, Vit D vit E safflower  Psychotropic Info: Lexapro 5 mg PO daily, Remeron 15 mg PO daily at bedtime Insulin Sliding Scale Info: Levemir 5  units  SQ daily at bedtime        Current Medications (07/10/2018):  This is the current hospital active medication list Current Facility-Administered Medications  Medication Dose Route Frequency Provider Last Rate Last Dose  . 0.9 %  sodium chloride infusion   Intravenous Continuous Meuth, Brooke A, PA-C 10 mL/hr at 07/08/18 0932    . acetaminophen (TYLENOL) tablet 650 mg  650 mg Oral Q6H PRN Jillyn Ledger, PA-C   650 mg at 07/10/18 6606   Or  . acetaminophen (TYLENOL) suppository 650 mg  650 mg Rectal Q6H PRN Jillyn Ledger, PA-C      . albuterol (PROVENTIL) (2.5 MG/3ML) 0.083% nebulizer solution 2.5 mg  2.5 mg Inhalation Q6H PRN Meuth, Brooke A, PA-C      . diphenhydrAMINE (BENADRYL) 12.5 MG/5ML elixir 12.5 mg  12.5 mg Oral Q6H PRN Maczis, Barth Kirks, PA-C      . enoxaparin (LOVENOX) injection 40 mg  40 mg Subcutaneous Q24H Jillyn Ledger, PA-C   40 mg at 07/09/18 1756  . escitalopram (LEXAPRO) tablet 5 mg  5 mg Oral Daily Meuth, Brooke A, PA-C   5 mg at 07/10/18 3016  . fluticasone (FLONASE) 50 MCG/ACT nasal spray 1 spray  1 spray Each Nare Daily PRN Meuth, Brooke A, PA-C      . gabapentin (NEURONTIN) capsule 300 mg  300 mg Oral TID AC & HS Meuth, Brooke A, PA-C  300 mg at 07/10/18 0736  . hydrALAZINE (APRESOLINE) injection 10 mg  10 mg Intravenous Q2H PRN Maczis, Barth Kirks, PA-C      . hydrochlorothiazide (MICROZIDE) capsule 12.5 mg  12.5 mg Oral Daily Meuth, Brooke A, PA-C   12.5 mg at 07/10/18 7185  . insulin aspart (novoLOG) injection 0-15 Units  0-15 Units Subcutaneous TID WC Jillyn Ledger, PA-C   2 Units at 07/10/18 [id number]  . insulin detemir (LEVEMIR) injection 5 Units  5 Units Subcutaneous QHS Jillyn Ledger, PA-C   5 Units at 07/09/18 2147  . ipratropium-albuterol (DUONEB) 0.5-2.5 (3) MG/3ML nebulizer solution 3 mL  3 mL Nebulization Q4H PRN Maczis, Barth Kirks, PA-C      . lisinopril (PRINIVIL,ZESTRIL) tablet 20 mg  20 mg Oral Daily Meuth, Brooke A, PA-C   20 mg at  07/10/18 8682  . methocarbamol (ROBAXIN) tablet 500 mg  500 mg Oral Q6H PRN Maczis, Barth Kirks, PA-C      . mirtazapine (REMERON) tablet 15 mg  15 mg Oral QHS Meuth, Brooke A, PA-C   15 mg at 07/09/18 2147  . morphine 2 MG/ML injection 2 mg  2 mg Intravenous Q2H PRN Jillyn Ledger, PA-C   2 mg at 07/06/18 1419  . ondansetron (ZOFRAN-ODT) disintegrating tablet 4 mg  4 mg Oral Q6H PRN Maczis, Barth Kirks, PA-C       Or  . ondansetron [facility]) injection 4 mg  4 mg Intravenous Q6H PRN Maczis, Barth Kirks, PA-C      . oxyCODONE (Oxy IR/ROXICODONE) immediate release tablet 5-10 mg  5-10 mg Oral Q4H PRN Maczis, Barth Kirks, PA-C      . pantoprazole (PROTONIX) EC tablet 40 mg  40 mg Oral QHS Meuth, Brooke A, PA-C   40 mg at 07/09/18 2147  . pravastatin (PRAVACHOL) tablet 40 mg  40 mg Oral Daily Meuth, Brooke A, PA-C   40 mg at 07/10/18 5749  . simethicone (MYLICON) chewable tablet 40 mg  40 mg Oral Q6H PRN Jillyn Ledger, PA-C         Discharge Medications: Please see discharge summary for a list of discharge medications.  Relevant Imaging Results:  Relevant Lab Results:   Additional Information SSN [address] [place], [place]

## 2018-07-11 DIAGNOSIS — R911 Solitary pulmonary nodule: Secondary | ICD-10-CM

## 2018-07-11 LAB — URINE CULTURE: Culture: NO GROWTH

## 2018-07-11 LAB — GLUCOSE, CAPILLARY
GLUCOSE-CAPILLARY: 245 mg/dL — AB (ref 70–99)
Glucose-Capillary: 119 mg/dL — ABNORMAL HIGH (ref 70–99)

## 2018-07-11 MED ORDER — FUROSEMIDE 20 MG PO TABS: 20.0000 mg | ORAL_TABLET | Freq: Every day | ORAL | Status: DC

## 2018-07-11 MED ORDER — FUROSEMIDE 20 MG PO TABS: 20.0000 mg | ORAL_TABLET | Freq: Every day | ORAL | 0 refills | Status: DC

## 2018-07-11 MED ORDER — IPRATROPIUM-ALBUTEROL 0.5-2.5 (3) MG/3ML IN SOLN
3.0000 mL | Freq: Two times a day (BID) | RESPIRATORY_TRACT | Status: DC
  Administered 2018-07-11: 3 mL via RESPIRATORY_TRACT
  Filled 2018-07-11: qty 3

## 2018-07-11 MED ORDER — MOMETASONE FURO-FORMOTEROL FUM 200-5 MCG/ACT IN AERO: 2.0000 | INHALATION_SPRAY | Freq: Two times a day (BID) | RESPIRATORY_TRACT | 0 refills | Status: DC

## 2018-07-11 MED ORDER — PANTOPRAZOLE SODIUM 40 MG PO TBEC: 40.0000 mg | DELAYED_RELEASE_TABLET | Freq: Every day | ORAL | 0 refills | Status: DC

## 2018-07-11 MED ORDER — POTASSIUM CHLORIDE CRYS ER 20 MEQ PO TBCR
20.0000 meq | EXTENDED_RELEASE_TABLET | Freq: Every day | ORAL | Status: DC
  Administered 2018-07-11: 20 meq via ORAL
  Filled 2018-07-11: qty 1

## 2018-07-11 MED ORDER — IPRATROPIUM-ALBUTEROL 0.5-2.5 (3) MG/3ML IN SOLN: 3.0000 mL | Freq: Two times a day (BID) | RESPIRATORY_TRACT | 0 refills | Status: DC

## 2018-07-11 MED ORDER — FUROSEMIDE 20 MG PO TABS
20.0000 mg | ORAL_TABLET | Freq: Once | ORAL | Status: AC
  Administered 2018-07-11: 20 mg via ORAL
  Filled 2018-07-11: qty 1

## 2018-07-11 NOTE — Progress Notes (Signed)
Discharged to [facility] facility.Report given to [facility]. Personal belongings and discharged instructions given to [facility] staff.

## 2018-07-11 NOTE — TOC Transition Note (Signed)
Transition of Care [facility]) - CM/SW Discharge Note   Patient Details  Name: Charlene Houston MRN: [id number] Date of Birth: 1946/04/01  Transition of Care [facility]) CM/SW Contact:  Marilu Favre, RN Phone Number: 07/11/2018, 2:17 PM   Clinical Narrative:     Patient discharging to [facility]. PTAR arranged  Final next level of care: [place] Barriers to Discharge: Continued Medical Work up   Patient Goals and CMS Choice Patient states their goals for this hospitalization and ongoing recovery are:: to get stronger before I go home  CMS Medicare.gov Compare Post Acute Care list provided to:: Patient Choice offered to / list presented to : Patient  Discharge Placement                       Discharge Plan and Services     Post Acute Care Choice: [place]            DME Agency: NA HH Arranged: NA HH Agency: NA   Social Determinants of Health (SDOH) Interventions     Readmission Risk Interventions No flowsheet data found.

## 2018-07-11 NOTE — TOC Progression Note (Addendum)
Transition of Care [facility]) - Progression Note    Patient Details  Name: Chanteria Haggard MRN: [id number] Date of Birth: 07-25-1945  Transition of Care [facility]) CM/SW Contact  Jacalyn Lefevre Edson Snowball, RN Phone Number: 07/11/2018, 11:44 AM  Clinical Narrative:     Confirmed with patient she wants to go to [facility] today. Patient medically stable for discharge.   Called daughter Davy Pique from patient room. Davy Pique does not want her mother to go to [facility]. Davy Pique stated she emailed NCM back and asked about three SNF's that did not offer bed. NCM never got email or phone call from [place]. Patient stated Davy Pique did not call her regarding same. Patient and daughter discussed SNF's. Daughter would like NCM to check with [facility] in [place] and [facility] at [place]. Also , [facility] in [place] ( [address]) [place] is not taking outside residents.   NCM will check with two others.   Patient wants to do "whatever my daughter wants".  Patient and Davy Pique both aware patient has been discharged. If [facility] ( [place]) or [facility] unable to take her today , she can still go to [facility] for rehab , and ask to be transferred if she wishes or home with home health. Davy Pique may take patient home to her home with home health.   No beds at [facility] or  [place] ( [place] ) and [place] is reviewing. No response from [place].  1351 : Explained above to patient. Called Sonya from patient's room placed her on speaker phone explained above to [place]. Patient and Davy Pique both agreed to go to [facility] today via ambulance.  [facility] just confirmed they are unable to offer patient a bed.  Confirmed with Santiago Glad at [facility] room is 125 A nurse to call report to [phone].  1400 PTAR called.  Expected Discharge Plan: [place] Barriers to Discharge: Continued Medical Work up  Expected Discharge Plan and  Services Expected Discharge Plan: [place] Choice: [place] arrangements for the past 2 months: Single Family Home Expected Discharge Date: 07/11/18                 DME Agency: NA HH Arranged: NA HH Agency: NA   Social Determinants of Health (SDOH) Interventions    Readmission Risk Interventions No flowsheet data found.

## 2018-07-11 NOTE — Discharge Instructions (Signed)
Bowel Obstruction This has resolved, just stay on your Diabetic diet as before A bowel obstruction is a blockage in the small or large bowel. The bowel, which is also called the intestine, is a long, slender tube that connects the stomach to the anus. When a person eats and drinks, food and fluids go from the mouth to the stomach to the small bowel. This is where most of the nutrients in the food and fluids are absorbed. After the small bowel, material passes through the large bowel for further absorption until any leftover material leaves the body as stool through the anus during a bowel movement. A bowel obstruction will prevent food and fluids from passing through the bowel as they normally do during digestion. The bowel can become partially or completely blocked. If this condition is not treated, it can be dangerous because the bowel could rupture. What are the causes? Common causes of this condition include:  Scar tissue (adhesions) from previous surgery or treatment with high-energy X-rays (radiation).  Recent surgery. This may cause the movements of the bowel to slow down and cause food to block the intestine.  Inflammatory bowel disease, such asCrohn's disease or diverticulitis.  Growths or tumors.  A bulging organ (hernia).  Twisting of the bowel (volvulus).  A foreign body.  Slipping of a part of the bowel into another part (intussusception). What are the signs or symptoms? Symptoms of this condition include:  Pain in the abdomen. Depending on the degree of obstruction, pain may be: ? Mild or severe. ? Dull cramping or sharp pain. ? In one area or in the entire abdomen.  Nausea and vomiting. Vomit may be greenish or a yellow bile color.  Bloating in the abdomen.  Difficulty passing stool (constipation).  Lack of passing gas.  Frequent belching.  Diarrhea. This may occur if the obstruction is partial and runny stool is able to leak around the obstruction. How is this  diagnosed? This condition may be diagnosed based on:  A physical exam.  Medical history.  Imaging tests of the abdomen or pelvis, such as X-ray or CT scan.  Blood or urine tests. How is this treated? Treatment for this condition depends on the cause and severity of the problem. Treatment may include:  Fluids and pain medicines that are given through an IV. Your health care provider may instruct you not to eat or drink if you have nausea or vomiting.  Eating a simple diet. You may be asked to consume a clear liquid diet for several days. This allows the bowel to rest.  Placement of a small tube (nasogastric tube) into the stomach. This will relieve pain, discomfort, and nausea by removing blocked air and fluids from the stomach. It can also help the obstruction clear up faster.  Surgery. This may be required if other treatments do not work. Surgery may be required for: ? Bowel obstruction from a hernia. This can be an emergency procedure. ? Scar tissue that causes frequent or severe obstructions. Follow these instructions at home: Medicines  Take over-the-counter and prescription medicines only as told by your health care provider.  If you were prescribed an antibiotic medicine, take it as told by your health care provider. Do not stop taking the antibiotic even if you start to feel better. General instructions  Follow instructions from your health care provider about eating restrictions. You may need to avoid solid foods and consume only clear liquids until your condition improves.  Return to your normal activities as  told by your health care provider. Ask your health care provider what activities are safe for you.  Avoid sitting for a long time without moving. Get up to take short walks every 1-2 hours. This is important to improve blood flow and breathing. Ask for help if you feel weak or unsteady.  Keep all follow-up visits as told by your health care provider. This is  important. How is this prevented? After having a bowel obstruction, you are more likely to have another. You may do the following things to prevent another obstruction:  If you have a long-term (chronic) disease, pay attention to your symptoms and contact your health care provider if you have questions or concerns.  Avoid becoming constipated. To prevent or treat constipation, your health care provider may recommend that you: ? Drink enough fluid to keep your urine pale yellow. ? Take over-the-counter or prescription medicines. ? Eat foods that are high in fiber, such as beans, whole grains, and fresh fruits and vegetables. ? Limit foods that are high in fat and processed sugars, such as fried or sweet foods.  Stay active. Exercise for 30 minutes or more, 5 or more days each week. Ask your health care provider which exercises are safe for you.  Avoid stress. Find ways to reduce stress, such as meditation, exercise, or taking time for activities that relax you.  Instead of eating three large meals each day, eat three small meals with three small snacks.  Work with a Microbiologist to make a healthy meal plan that works for you.  Do not use any products that contain nicotine or tobacco, such as cigarettes and e-cigarettes. If you need help quitting, ask your health care provider. Contact a health care provider if you:  Have a fever.  Have chills. Get help right away if you:  Have increased pain or cramping.  Vomit blood.  Have uncontrolled vomiting or nausea.  Cannot drink fluids because of vomiting or pain.  Become confused.  Begin feeling very thirsty (dehydrated).  Have severe bloating.  Feel extremely weak or you faint. Summary  A bowel obstruction is a blockage in the small or large bowel.  A bowel obstruction will prevent food and fluids from passing through the bowel as they normally do during digestion.  Treatment for this condition depends on the cause and severity  of the problem. It may include fluids and pain medicines through an IV, a simple diet, a nasogastric tube, or surgery.  Follow instructions from your health care provider about eating restrictions. You may need to avoid solid foods and consume only clear liquids until your condition improves. This information is not intended to replace advice given to you by your health care provider. Make sure you discuss any questions you have with your health care provider. Document Released: 06/15/2005 Document Revised: 08/10/2017 Document Reviewed: 08/10/2017 Elsevier Interactive Patient Education  2019 [employer].

## 2018-07-11 NOTE — TOC Progression Note (Addendum)
Transition of Care [facility]) - Progression Note    Patient Details  Name: Charlene Houston MRN: [id number] Date of Birth: 09/12/1945  Transition of Care [facility]) CM/SW Contact  Jacalyn Lefevre Edson Snowball, RN Phone Number: 07/11/2018, 11:26 AM  Clinical Narrative:     Confirmed with patient she wants to go to [facility] today. Patient medically stable for discharge.   Called daughter Charlene Houston from patient room. Charlene Houston does not want her mother to go to [facility]. Charlene Houston stated she emailed NCM back and asked about three SNF's that did not offer bed. NCM never got email or phone call from [place]. Patient stated Charlene Houston did not call her regarding same. Patient and daughter discussed SNF's. Daughter would like NCM to check with [facility] in [place] and [place] at [place]. Also , [facility] in [place] ( [address]) [place] is not taking outside residents.   NCM will check with two others.   Patient wants to do "whatever my daughter wants".  Patient and Charlene Houston both aware patient has been discharged. If [facility] or [facility] unable to take her today , she can still go to [facility] for rehab , and ask to be transferred if she wishes or home with home health. Charlene Houston may take patient home to her home with home health. Expected Discharge Plan: [place] Barriers to Discharge: Continued Medical Work up  Expected Discharge Plan and Services Expected Discharge Plan: [place] Choice: [employer] arrangements for the past 2 months: Single Family Home Expected Discharge Date: 07/11/18                 DME Agency: NA HH Arranged: NA HH Agency: NA   Social Determinants of Health (SDOH) Interventions    Readmission Risk Interventions No flowsheet data found.

## 2018-07-11 NOTE — Discharge Summary (Signed)
Physician Discharge Summary  Patient ID: Charlene Houston MRN: [id number] DOB/AGE: [birth year] 73 y.o.  Admit date: 07/06/2018 Discharge date: 07/11/2018  Admission Diagnoses:  SBO Hypertension Insulin-dependent diabetes mellitus. Asthma/COPD history of tobacco use Obstructive sleep apnea with CPAP BMI 38 Hx of breast cancer with lumpectomy and chemotherapy  Discharge Diagnoses:  SBO Hypertension Insulin-dependent diabetes mellitus Hx breast cancer with lumpectomy/chemotherapy Asthma/COPD history of tobacco use Obstructive sleep apnea with CPAP Left lower lobe pulmonary nodule BMI 38 Hypoxia  Active Problems:   SBO (small bowel obstruction) ([place])   PROCEDURES: none  Hospital Course:  Charlene Houston is a 73 y.o. female with a history of insulin-dependent diabetes, hypertension, asthma, and a remote history of breast cancer who presented to [facility] for abdominal pain, N/V and bloating.   Patient reports that around noon yesterday after eating a bag of potato chips she began having some sharp abdominal pain in her epigastric area that radiated to her back.  She notes that the abdominal pain was severe, and came in waves on/off.  It was associated with bloating, nausea as well as 2 episodes of nonbilious emesis with the last just prior to arrival to the emergency department.  The patient reports that she did have one small bowel movement approximately 1 hour after the onset of her symptoms. She denies further bowel movements and is no longer passing any flatus.  She did try water with baking soda at home without any relief.  She denies similar pain in the past.  She presented to the emergency department for evaluation and was found to have a small bowel obstruction on CT.  General surgery was consulted.  Patient denies history of small bowel obstruction in the past.  She does have a history of abdominal hysterectomy and also reports a exploratory laparoscopy for an ovarian  surgery but I am unable to see this in her records or care everywhere.  Her symptoms have improved since IV pain medication and she is resting comfortably.  She denies any fever, chills, chest pain, cough urinary symptoms or diarrhea. She is not on blood thinners. She does note that she has some mild shortness of breath secondary to her asthma. She is currently on 2L in the ED. She does not wear oxygen at home.   She has a history of obstructive sleep apnea and is on CPAP at home.  Work-up in the ED showed small bowel obstruction she had an had an NG tube placed and was admitted for bowel rest.  She was placed on small bowel protocol and after 48 hours contrast was in the colon and there was resolution of the small bowel dilatation.  Her diet was slowly advanced she was maintained on IV fluids.  On exam 07/10/18, it was noted she was still on oxygen and desaturating off O2.  She could only pull about 250 mL on incentive spirometry and had a cough with this.  Chest x-ray was obtained; this showed a left lower lobe pulmonary nodule which is been present and seen on the admission CT.  She also had pulmonary vascular congestion with some chronic bronchitic changes without any overt pulmonary edema.  She was given IV Lasix and a Medicine consult was obtained.  She was seen by Dr. Verlon Au.  Labs obtained 3/30 showed normal BMP, normal UA, CBC was also normal.BNP was 62.3.  He agreed with diuresis.  Patient been placed back on her preadmission medicines which included lisinopril with hydrochlorothiazide.  Patient was originally intended to  go home by herself.  She was walked by physical therapy and O2 sats were 88% on room air at rest.  Dropped to 80% ambulating.  At this point the patient agreed she cannot go home by herself and arrangements were made for a transfer to a skilled nursing facility.  She was seen by Dr. Tyrell Antonio on 07/11/18.  She was converted from hydrochlorothiazide to Lasix.  The patient gave Dr.  Tyrell Antonio a history of shortness of breath and cough for more than 5 years.  She scheduled her for nebulizers twice a day.  She was stable from pulmonary standpoint although she still required supplemental oxygen therapy.  It was Dr. Paulina Fusi opinion that she can be discharged to a skilled nursing facility on home O2 with medications as noted below.  Her small bowel obstruction is completely resolved she is tolerating a soft diet, and bowel movements are just about back to normal.  She does not need to follow-up with general surgery for small bowel obstruction but she will need to follow-up with her primary care for her medical issues noted above including her;  Hypoxia, Hypertension, pulmonary nodule, diabetes, and obstructive sleep apnea.  From our standpoint her oxygen can be weaned as tolerated to maintain O2 sats 92% or greater while ambulating. Continue CPAP at home as before. Medicine has adjusted her home medicines as before.  O2 as needed to keep her O2 saturations up to 92% ambulating.    CBC Latest Ref Rng & Units 07/10/2018 07/06/2018  WBC 4.0 - 10.5 K/uL 7.6 11.4(H)  Hemoglobin 12.0 - 15.0 g/dL 11.6(L) 13.1  Hematocrit 36.0 - 46.0 % 38.1 40.7  Platelets 150 - 400 K/uL 215 278    CMP Latest Ref Rng & Units 07/10/2018 07/07/2018 07/06/2018  Glucose 70 - 99 mg/dL 214(H) 144(H) 191(H)  BUN 8 - 23 mg/dL <5(L) <5(L) 5(L)  Creatinine 0.44 - 1.00 mg/dL 0.76 0.74 0.85  Sodium 135 - 145 mmol/L 136 138 134(L)  Potassium 3.5 - 5.1 mmol/L 3.6 3.8 3.8  Chloride 98 - 111 mmol/L 97(L) 100 95(L)  CO2 22 - 32 mmol/L 30 26 26   Calcium 8.9 - 10.3 mg/dL 8.8(L) 8.0(L) 9.0  Total Protein 6.5 - 8.1 g/dL - - 7.2  Total Bilirubin 0.3 - 1.2 mg/dL - - 0.5  Alkaline Phos 38 - 126 U/L - - 48  AST 15 - 41 U/L - - 31  ALT 0 - 44 U/L - - 14   CT scan 07/06/2018:1. High-grade proximal small bowel obstruction. 2. Lower lobe nodules, including a 2 cm nodule on the left-likely metastatic disease in this patient with  history of breast cancer. Recommend oncology follow-up. 3. Chronic findings are described above. 4. Hepatic steatosis and cholelithiasis.  CXR 07/10/18:  1. Left lower lobe pulmonary nodule better visualized on recent prior CT scan of the abdomen and pelvis. 2. Cardiomegaly. 3. Pulmonary vascular congestion and chronic bronchitic changes without overt pulmonary edema.  Condition on discharge: Improving  Disposition: SNF   Allergies as of 07/11/2018      Reactions   Prozac [fluoxetine Hcl] Other (See Comments)   Gittery   Vit D-vit E-safflower Oil Other (See Comments)   Not specified      Medication List    STOP taking these medications   hydrochlorothiazide 12.5 MG capsule Commonly known as:  MICROZIDE     TAKE these medications   acetaminophen 325 MG tablet Commonly known as:  TYLENOL Take 650 mg by mouth every 6 (six)  hours as needed for mild pain or headache.   albuterol 108 (90 Base) MCG/ACT inhaler Commonly known as:  PROVENTIL HFA;VENTOLIN HFA Inhale 1-2 puffs into the lungs every 6 (six) hours as needed for wheezing or shortness of breath.   escitalopram 5 MG tablet Commonly known as:  LEXAPRO Take 5 mg by mouth daily.   fluticasone 50 MCG/ACT nasal spray Commonly known as:  FLONASE Place 1 spray into both nostrils as needed for allergies or rhinitis.   furosemide 20 MG tablet Commonly known as:  LASIX Take 1 tablet (20 mg total) by mouth daily. Start taking on:  July 12, 2018   gabapentin 300 MG capsule Commonly known as:  NEURONTIN Take 300 mg by mouth 4 (four) times daily. Taking twice daily   insulin detemir 100 UNIT/ML injection Commonly known as:  LEVEMIR Inject 5 Units into the skin at bedtime.   ipratropium-albuterol 0.5-2.5 (3) MG/3ML Soln Commonly known as:  DUONEB Take 3 mLs by nebulization 2 (two) times daily.   lisinopril 20 MG tablet Commonly known as:  PRINIVIL,ZESTRIL Take 20 mg by mouth daily.   mirtazapine 15 MG  tablet Commonly known as:  REMERON Take 15 mg by mouth at bedtime.   mometasone-formoterol 200-5 MCG/ACT Aero Commonly known as:  DULERA Inhale 2 puffs into the lungs 2 (two) times daily.   pantoprazole 40 MG tablet Commonly known as:  PROTONIX Take 1 tablet (40 mg total) by mouth at bedtime.   potassium chloride SA 20 MEQ tablet Commonly known as:  K-DUR,KLOR-CON Take 20 mEq by mouth daily.   pravastatin 40 MG tablet Commonly known as:  PRAVACHOL Take 40 mg by mouth daily.   triamcinolone cream 0.5 % Commonly known as:  KENALOG Apply 1 application topically 3 (three) times daily.       Contact information for follow-up providers    Gustavo Lah MD. Call in 1 day(s).   Why:  As we discussed. Your CT demonstrated evidence of lower lobe nodules, including a 2 cm nodule on the left that was suggestive of metastatic disease. It is very important that you follow up with your oncologist for this Contact information: Please call your oncologist to schedule a follow up appointment.        Derwood Kaplan, MD. Call.   Specialty:  Family Medicine Why:  Call to arrange post-hospitalization follow up appointment with your PCP in 2-3 weeks Contact information: [address] [place] [address] [phone]            Contact information for after-discharge care    Destination    [facility] .   Service:  Skilled Nursing Contact information: [address] [place] [phone]                  Signed: Earnstine Regal 07/11/2018, 10:28 AM

## 2018-07-11 NOTE — Progress Notes (Signed)
CC: Abdominal pain  Subjective: Patient sitting up in the chair saturations are about 94% on 2 L nasal cannula.  She is tolerating soft diet and having more normal bowel movements.  She can move about 600 cc on incentive spirometry but is still coughing with this.  She says she worked on it all day yesterday.  Patient yesterday decided to go to a skilled nursing facility at discharge.  I did take her oxygen off and left her off O2 for short time sitting up in a chair, and she dropped down to 87% on room air.  Objective: Vital signs in last 24 hours: Temp:  [98.3 F (36.8 C)-98.8 F (37.1 C)] 98.6 F (37 C) (03/31 0618) Pulse Rate:  [74-83] 74 (03/31 0618) Resp:  [16-20] 17 (03/31 0618) BP: (124-130)/(61-75) 130/64 (03/31 0618) SpO2:  [93 %-100 %] 93 % (03/31 0618) Last BM Date: 07/10/18 680 PO 850 urine recorded BM x 1 Afebrile, VSS OT/PT recorded diminish saturations, but seems to be doing well with O2, she was not on O2 before admission CBC, BMP and UA were normal yesterday CXR yesterday shows: Pulmonary vascular congestion and chronic bronchitic changes with out overt pulmonary edema.  Cardiomegaly, lower lobe pulmonary nodule seen on prior CT scans Intake/Output from previous day: 03/30 0701 - 03/31 0700 In: 680 [P.O.:680] Out: 850 [Urine:850] Intake/Output this shift: Total I/O In: -  Out: 400 [Urine:400]  General appearance: alert, cooperative and no distress Resp: clear to auscultation bilaterally GI: soft, non-tender; bowel sounds normal; no masses,  no organomegaly Extremities: No edema.  Lab Results:  Recent Labs    07/10/18 1117  WBC 7.6  HGB 11.6*  HCT 38.1  PLT 215    BMET Recent Labs    07/10/18 1117  NA 136  K 3.6  CL 97*  CO2 30  GLUCOSE 214*  BUN <5*  CREATININE 0.76  CALCIUM 8.8*   PT/INR No results for input(s): LABPROT, INR in the last 72 hours.  Recent Labs  Lab 07/06/18 0336  AST 31  ALT 14  ALKPHOS 48  BILITOT 0.5   PROT 7.2  ALBUMIN 3.6     Lipase     Component Value Date/Time   LIPASE 36 07/06/2018 0336     Prior to Admission medications   Medication Sig Start Date End Date Taking? Authorizing Provider  acetaminophen (TYLENOL) 325 MG tablet Take 650 mg by mouth every 6 (six) hours as needed for mild pain or headache.   Yes [provider]  albuterol (PROVENTIL HFA;VENTOLIN HFA) 108 (90 Base) MCG/ACT inhaler Inhale 1-2 puffs into the lungs every 6 (six) hours as needed for wheezing or shortness of breath.   Yes [provider]  escitalopram (LEXAPRO) 5 MG tablet Take 5 mg by mouth daily. 06/15/18  Yes [provider]  fluticasone (FLONASE) 50 MCG/ACT nasal spray Place 1 spray into both nostrils as needed for allergies or rhinitis.    Yes [provider]  gabapentin (NEURONTIN) 300 MG capsule Take 300 mg by mouth 4 (four) times daily. Taking twice daily 10/26/17  Yes [provider]  hydrochlorothiazide (MICROZIDE) 12.5 MG capsule Take 12.5 mg by mouth daily. 04/28/15  Yes [provider]  insulin detemir (LEVEMIR) 100 UNIT/ML injection Inject 5 Units into the skin at bedtime.    Yes [provider]  lisinopril (PRINIVIL,ZESTRIL) 20 MG tablet Take 20 mg by mouth daily. 04/11/18  Yes [provider]  mirtazapine (REMERON) 15 MG tablet  Take 15 mg by mouth at bedtime.   Yes [provider]  potassium chloride SA (K-DUR,KLOR-CON) 20 MEQ tablet Take 20 mEq by mouth daily. 10/26/17  Yes [provider]  pravastatin (PRAVACHOL) 40 MG tablet Take 40 mg by mouth daily.   Yes [provider]  triamcinolone cream (KENALOG) 0.5 % Apply 1 application topically 3 (three) times daily. 10/06/17  Yes [provider]    Medications: . enoxaparin (LOVENOX) injection  40 mg Subcutaneous Q24H  . escitalopram  5 mg Oral Daily  . gabapentin  300 mg Oral TID AC & HS  . hydrochlorothiazide  12.5 mg Oral Daily  . insulin  aspart  0-15 Units Subcutaneous TID WC  . insulin detemir  5 Units Subcutaneous QHS  . lisinopril  20 mg Oral Daily  . mirtazapine  15 mg Oral QHS  . mometasone-formoterol  2 puff Inhalation BID  . pantoprazole  40 mg Oral QHS  . pravastatin  40 mg Oral Daily  Clements, Thad A, MD: PCP  Assessment/Plan  HTN-home meds IDDM - SSI - HgbA1c7.1 Hx of Breast Cancer - S/p chemo/radiation and right lumpectomy and axillary lymph node dissection in 2007 of the right breast - CT with lower lobe nodules, including a 2 cm nodule on the left-likely metastatic disease in this patient with history of breast cancer. Patient states she is aware of this and has been followed for this by her oncologist in Dorma Russell MD. She has continued to follow with her reportedly since moving to [place] 2 years ago. Will have the patient follow up with her for this. Asthma- PRN Duonebs O2 desaturations with cough/Hx of Tobacco use 25 years- quit 1995 Urinary incontinence.    SBO/Hx abdominal hysterectomy - CT with high grade SBO.  - SBO protocolshowed contrast in colon - Mobilize for bowel function - had a BM and flatus  ID -None VTE -SCD, Lovenox FEN -soft diet Foley -None Follow up -TBD. Oncologist, Gustavo Lah MD. PCP  Plan: Patient will need to go home with home O2, she is going to a skilled nursing facility.  She will not need follow-up from a surgical standpoint but will need follow-up from her primary care.  Give her an extra dose of Lasix this a.m.  Await medicines recommendations today.  LOS: 5 days    Thamara Leger 07/11/2018 [phone]

## 2018-07-11 NOTE — Progress Notes (Signed)
PROGRESS NOTE    Charlene Houston  DDU:[id number] DOB: 08/10/1945 DOA: 07/06/2018 PCP: Derwood Kaplan, MD   Brief Narrative: 73 year old with past medical history significant for breast cancer status post lumpectomy, lymph node removal on chemotherapy 5 years ago, asthma, hypertension, diabetes OSA on CPAP who was admitted on 326 with SBO.  She was admitted by the surgical team.  Her SBO has resolved, but patient was noted to be hypoxic.  Hospitalist consulted for further evaluation She reports chronic cough and chronic shortness of breath on exertion for the last 5 years. Chest x-ray showed pulmonary vascular congestion but no florid pulmonary edema.  Patient received IV Lasix.   Assessment & Plan:   Active Problems:   SBO (small bowel obstruction) ([facility])  1-acute hypoxic respiratory failure: Could be related to her chronic baseline OSA, some pulmonary edema, patient was noted to have lower extremity edema. Patient received IV Lasix.  She is feeling better today. I will continue with oxygen supplementation.  We will continue with oral laxis.  I will add KCl.  Stop hydrochlorothiazide now that she is going to be on Lasix.  She is currently feeling that she is breathing at baseline, she does report  chronic shortness of breath and cough, for more than 5 years.  Patient denies chest pain.  She has been on DVT prophylaxis since admission. Will schedule nebulizer treatment twice daily.  2-Diabetes: Continue with Lantus and sliding scale.  3-Hypertension continue lisinopril.  We will stop hydrochlorothiazide now that she is going to be on Lasix.  Stable for discharge, I have completed medication reconciliation for discharge.  Estimated body mass index is 37.93 kg/m as calculated from the following:   Height as of this encounter: 5\' 6"  (1.676 m).   Weight as of this encounter: 106.6 kg.    Subjective: Patient sitting in the recliner.  She is breathing at baseline.  She denies  chest pain.  She does report a chronic history of cough and shortness of breath on exertion for the last 5 years.  No fever.  Objective: Vitals:   07/10/18 2102 07/10/18 2107 07/11/18 0618 07/11/18 0832  BP:   130/64   Pulse:  82 74   Resp:  20 17   Temp:   98.6 F (37 C)   TempSrc:   Oral   SpO2: 97% 94% 93% 92%  Weight:      Height:        Intake/Output Summary (Last 24 hours) at 07/11/2018 0926 Last data filed at 07/11/2018 0758 Gross per 24 hour  Intake 560 ml  Output 1050 ml  Net -490 ml   Filed Weights   07/06/18 0328  Weight: 106.6 kg    Examination:  General exam: Appears calm and comfortable  Respiratory system: Decreased breath sounds. Cardiovascular system: S1 & S2 heard, RRR. No JVD, murmurs, rubs, gallops or clicks. No pedal edema. Gastrointestinal system: Abdomen is nondistended, soft and nontender. No organomegaly or masses felt. Normal bowel sounds heard. Central nervous system: Alert and oriented. No focal neurological deficits. Extremities: Symmetric 5 x 5 power. Skin: No rashes, lesions or ulcers Psychiatry: Judgement and insight appear normal. Mood & affect appropriate.     Data Reviewed: I have personally reviewed following labs and imaging studies  CBC: Recent Labs  Lab 07/06/18 0336 07/10/18 1117  WBC 11.4* 7.6  NEUTROABS 7.6  --   HGB 13.1 11.6*  HCT 40.7 38.1  MCV 92.9 94.1  PLT 278 215   Basic  Metabolic Panel: Recent Labs  Lab 07/06/18 0336 07/06/18 0821 07/07/18 0208 07/10/18 1117  NA 134*  --  138 136  K 3.8  --  3.8 3.6  CL 95*  --  100 97*  CO2 26  --  26 30  GLUCOSE 191*  --  144* 214*  BUN 5*  --  <5* <5*  CREATININE 0.85  --  0.74 0.76  CALCIUM 9.0  --  8.0* 8.8*  MG  --  1.6* 2.0  --    GFR: Estimated Creatinine Clearance: 78.5 mL/min (by C-G formula based on SCr of 0.76 mg/dL). Liver Function Tests: Recent Labs  Lab 07/06/18 0336  AST 31  ALT 14  ALKPHOS 48  BILITOT 0.5  PROT 7.2  ALBUMIN 3.6    Recent Labs  Lab 07/06/18 0336  LIPASE 36   No results for input(s): AMMONIA in the last 168 hours. Coagulation Profile: No results for input(s): INR, PROTIME in the last 168 hours. Cardiac Enzymes: No results for input(s): CKTOTAL, CKMB, CKMBINDEX, TROPONINI in the last 168 hours. BNP (last 3 results) No results for input(s): PROBNP in the last 8760 hours. HbA1C: No results for input(s): HGBA1C in the last 72 hours. CBG: Recent Labs  Lab 07/10/18 0824 07/10/18 1233 07/10/18 1701 07/10/18 2034 07/11/18 0810  GLUCAP 128* 146* 144* 236* 119*   Lipid Profile: No results for input(s): CHOL, HDL, LDLCALC, TRIG, CHOLHDL, LDLDIRECT in the last 72 hours. Thyroid Function Tests: No results for input(s): TSH, T4TOTAL, FREET4, T3FREE, THYROIDAB in the last 72 hours. Anemia Panel: No results for input(s): VITAMINB12, FOLATE, FERRITIN, TIBC, IRON, RETICCTPCT in the last 72 hours. Sepsis Labs: No results for input(s): PROCALCITON, LATICACIDVEN in the last 168 hours.  Recent Results (from the past 240 hour(s))  Urine Culture     Status: None   Collection Time: 07/10/18 10:11 AM  Result Value Ref Range Status   Specimen Description URINE, CLEAN CATCH  Final   Special Requests NONE  Final   Culture   Final    NO GROWTH Performed at [facility], [address], [place], [place] [address]    Report Status 07/11/2018 FINAL  Final         Radiology Studies: Dg Chest 2 View  Result Date: 07/10/2018 CLINICAL DATA:  74 year old female with a history of pulmonary nodule EXAM: CHEST - 2 VIEW COMPARISON:  Prior chest x-ray an acute abdominal series 07/06/2018; CT scan of the abdomen and pelvis including the lung bases 07/06/2018 FINDINGS: Cardiopericardial silhouette is mildly enlarged. Mild pulmonary vascular congestion with diffuse interstitial prominence. Suggestion of a posterior left lower lobe pulmonary nodule on the lateral view. This is not well seen on the frontal view  secondary to patient body habitus and underlying lung disease. IMPRESSION: 1. Left lower lobe pulmonary nodule better visualized on recent prior CT scan of the abdomen and pelvis. 2. Cardiomegaly. 3. Pulmonary vascular congestion and chronic bronchitic changes without overt pulmonary edema. Electronically Signed   By: Jacqulynn Cadet M.D.   On: 07/10/2018 11:02        Scheduled Meds: . enoxaparin (LOVENOX) injection  40 mg Subcutaneous Q24H  . escitalopram  5 mg Oral Daily  . gabapentin  300 mg Oral TID AC & HS  . insulin aspart  0-15 Units Subcutaneous TID WC  . insulin detemir  5 Units Subcutaneous QHS  . ipratropium-albuterol  3 mL Nebulization BID  . lisinopril  20 mg Oral Daily  . mirtazapine  15 mg Oral QHS  . mometasone-formoterol  2 puff Inhalation BID  . pantoprazole  40 mg Oral QHS  . pravastatin  40 mg Oral Daily   Continuous Infusions: . sodium chloride 10 mL/hr at 07/08/18 0932     LOS: 5 days    Time spent: 35 minutes    Elmarie Shiley, MD [facility] Pager [phone]  If 7PM-7AM, please contact night-coverage [email or web address] Password [facility] 07/11/2018, 9:26 AM

## 2018-07-11 NOTE — Care Management Important Message (Signed)
Important Message  Patient Details  Name: Charlene Houston MRN: [id number] Date of Birth: 1945/06/20   Medicare Important Message Given:  Yes    Orbie Pyo 07/11/2018, 4:23 PM

## 2019-04-23 ENCOUNTER — Ambulatory Visit (HOSPITAL_COMMUNITY)
Admission: EM | Admit: 2019-04-23 | Discharge: 2019-04-23 | Disposition: A | Discharge status: Home / Self Care | Payer: Medicare Other | Attending: Family Medicine | Admitting: Family Medicine

## 2019-04-23 ENCOUNTER — Encounter (HOSPITAL_COMMUNITY): Payer: Self-pay

## 2019-04-23 ENCOUNTER — Other Ambulatory Visit: Payer: Self-pay

## 2019-04-23 DIAGNOSIS — Z760 Encounter for issue of repeat prescription: Secondary | ICD-10-CM | POA: Diagnosis not present

## 2019-04-23 DIAGNOSIS — G5603 Carpal tunnel syndrome, bilateral upper limbs: Secondary | ICD-10-CM

## 2019-04-23 MED ORDER — PREDNISONE 10 MG PO TABS: 10.0000 mg | ORAL_TABLET | Freq: Every day | ORAL | 0 refills | Status: DC

## 2019-04-23 MED ORDER — MIRTAZAPINE 15 MG PO TABS: 15.0000 mg | ORAL_TABLET | Freq: Every day | ORAL | 3 refills | Status: DC

## 2019-04-23 NOTE — ED Triage Notes (Signed)
Patient presents to Urgent Care with complaints of needing a medication refill on her mertazapine since three days ago. Patient reports she has an appt with a new PCP on 05/01/2019 and would like a short prescription to get her through.

## 2019-04-23 NOTE — Discharge Instructions (Addendum)
Remember vitamin D fortified milk daily

## 2019-04-23 NOTE — ED Provider Notes (Signed)
[place]    CSN: [id number] Arrival date & time: 04/23/19  1318      History   Chief Complaint Chief Complaint  Patient presents with  . Medication Refill    HPI Charlene Houston is a 74 y.o. female.   This is the initial most [facility] urgent care visit for this 74 year old woman.  She needs a refill on her mirtazapine.  Patient presents to Urgent Care with complaints of needing a medication refill on her mirtazapine since three days ago. Patient reports she has an appt with a new PCP on 05/01/2019 and would like a short prescription to get her through.   Patient also has carpal tunnel and diabetes.  She says that she has chronic pain in her wrists and hands.     Past Medical History:  Diagnosis Date  . Arthritis   . Asthma   . Breast cancer ([place])   . Diabetes mellitus without complication ([place])   . Hypertension   . SBO (small bowel obstruction) ([place]) 06/2018    Patient Active Problem List   Diagnosis Date Noted  . SBO (small bowel obstruction) ([place]) 07/06/2018    Past Surgical History:  Procedure Laterality Date  . ABDOMINAL HYSTERECTOMY    . THUMB AMPUTATION Left    PARTIAL  . TONSILLECTOMY      OB History   No obstetric history on file.      Home Medications    Prior to Admission medications   Medication Sig Start Date End Date Taking? Authorizing Provider  acetaminophen (TYLENOL) 325 MG tablet Take 650 mg by mouth every 6 (six) hours as needed for mild pain or headache.    [provider]  albuterol (PROVENTIL HFA;VENTOLIN HFA) 108 (90 Base) MCG/ACT inhaler Inhale 1-2 puffs into the lungs every 6 (six) hours as needed for wheezing or shortness of breath.    [provider]  fluticasone (FLONASE) 50 MCG/ACT nasal spray Place 1 spray into both nostrils as needed for allergies or rhinitis.     [provider]  furosemide (LASIX) 20 MG tablet Take 1 tablet (20 mg total) by mouth daily. 07/12/18   Regalado, Belkys  A, MD  gabapentin (NEURONTIN) 300 MG capsule Take 300 mg by mouth 4 (four) times daily. Taking twice daily 10/26/17   [provider]  insulin detemir (LEVEMIR) 100 UNIT/ML injection Inject 5 Units into the skin at bedtime.     [provider]  ipratropium-albuterol (DUONEB) 0.5-2.5 (3) MG/3ML SOLN Take 3 mLs by nebulization 2 (two) times daily. 07/11/18   Regalado, Belkys A, MD  lisinopril (PRINIVIL,ZESTRIL) 20 MG tablet Take 20 mg by mouth daily. 04/11/18   [provider]  mirtazapine (REMERON) 15 MG tablet Take 1 tablet (15 mg total) by mouth at bedtime. 04/23/19   Robyn Haber, MD  mometasone-formoterol (DULERA) 200-5 MCG/ACT AERO Inhale 2 puffs into the lungs 2 (two) times daily. 07/11/18   Regalado, Belkys A, MD  pantoprazole (PROTONIX) 40 MG tablet Take 1 tablet (40 mg total) by mouth at bedtime. 07/11/18   Regalado, Belkys A, MD  potassium chloride SA (K-DUR,KLOR-CON) 20 MEQ tablet Take 20 mEq by mouth daily. 10/26/17   [provider]  pravastatin (PRAVACHOL) 40 MG tablet Take 40 mg by mouth daily.    [provider]  predniSONE (DELTASONE) 10 MG tablet Take 1 tablet (10 mg total) by mouth daily with breakfast. 04/23/19   Robyn Haber, MD  triamcinolone cream (KENALOG) 0.5 % Apply 1  application topically 3 (three) times daily. 10/06/17   [provider]  escitalopram (LEXAPRO) 5 MG tablet Take 5 mg by mouth daily. 06/15/18 04/23/19  [provider]    Family History Family History  Problem Relation Age of Onset  . Hypertension Mother   . Diabetes Mother   . Cancer Father     Social History Social History   Tobacco Use  . Smoking status: Former Smoker    Types: Cigarettes    Quit date: 1995    Years since quitting: 26.0  . Smokeless tobacco: Never Used  Substance Use Topics  . Alcohol use: Not Currently  . Drug use: Not Currently     Allergies   Prozac [fluoxetine hcl] and Vit d-vit e-safflower oil   Review  of Systems Review of Systems  Constitutional: Positive for fatigue.  Respiratory: Negative.   Musculoskeletal: Positive for arthralgias.  All other systems reviewed and are negative.    Physical Exam Triage Vital Signs ED Triage Vitals  Enc Vitals Group     BP 04/23/19 1351 126/66     Pulse Rate 04/23/19 1351 (!) 102     Resp 04/23/19 1351 20     Temp 04/23/19 1351 98.7 F (37.1 C)     Temp Source 04/23/19 1351 Oral     SpO2 04/23/19 1351 95 %     Weight --      Height --      Head Circumference --      Peak Flow --      Pain Score 04/23/19 1350 9     Pain Loc --      Pain Edu? --      Excl. in [place]? --    No data found.  Updated Vital Signs BP 126/66 (BP Location: Left Arm)   Pulse (!) 102   Temp 98.7 F (37.1 C) (Oral)   Resp 20   SpO2 95%    Physical Exam Vitals and nursing note reviewed.  Constitutional:      Appearance: Normal appearance. She is obese.  Eyes:     Conjunctiva/sclera: Conjunctivae normal.  Cardiovascular:     Rate and Rhythm: Tachycardia present.  Pulmonary:     Effort: Pulmonary effort is normal.  Musculoskeletal:        General: Tenderness present. Normal range of motion.     Cervical back: Normal range of motion and neck supple.     Comments: Bilateral wrist tenderness  Skin:    General: Skin is warm and dry.  Neurological:     General: No focal deficit present.     Mental Status: She is alert and oriented to person, place, and time.  Psychiatric:        Mood and Affect: Mood normal.        Behavior: Behavior normal.        Thought Content: Thought content normal.        Judgment: Judgment normal.      UC Treatments / Results  Labs (all labs ordered are listed, but only abnormal results are displayed)   Initial Impression / Assessment and Plan / UC Course  I have reviewed the triage vital signs and the nursing notes.  Pertinent labs & imaging results that were available during my care of the patient were reviewed by me  and considered in my medical decision making (see chart for details).    Final Clinical Impressions(s) / UC Diagnoses   Final diagnoses:  Bilateral carpal tunnel syndrome  Medication refill     Discharge Instructions     Remember vitamin D fortified milk daily    ED Prescriptions    Medication Sig Dispense Auth. Provider   mirtazapine (REMERON) 15 MG tablet Take 1 tablet (15 mg total) by mouth at bedtime. 90 tablet Robyn Haber, MD   predniSONE (DELTASONE) 10 MG tablet Take 1 tablet (10 mg total) by mouth daily with breakfast. 5 tablet Robyn Haber, MD     PDMP not reviewed this encounter.   Robyn Haber, MD 04/23/19 1431

## 2020-02-17 ENCOUNTER — Emergency Department (HOSPITAL_COMMUNITY): Payer: Medicare Other

## 2020-02-17 ENCOUNTER — Encounter (HOSPITAL_COMMUNITY): Payer: Self-pay | Admitting: Emergency Medicine

## 2020-02-17 ENCOUNTER — Other Ambulatory Visit: Payer: Self-pay

## 2020-02-17 ENCOUNTER — Emergency Department (HOSPITAL_COMMUNITY)
Admission: EM | Admit: 2020-02-17 | Discharge: 2020-02-18 | Disposition: A | Discharge status: Home / Self Care | Payer: Medicare Other | Attending: Emergency Medicine | Admitting: Emergency Medicine

## 2020-02-17 DIAGNOSIS — Z7951 Long term (current) use of inhaled steroids: Secondary | ICD-10-CM | POA: Insufficient documentation

## 2020-02-17 DIAGNOSIS — Y9289 Other specified places as the place of occurrence of the external cause: Secondary | ICD-10-CM | POA: Insufficient documentation

## 2020-02-17 DIAGNOSIS — Z794 Long term (current) use of insulin: Secondary | ICD-10-CM | POA: Diagnosis not present

## 2020-02-17 DIAGNOSIS — Y9369 Activity, other involving other sports and athletics played as a team or group: Secondary | ICD-10-CM | POA: Diagnosis not present

## 2020-02-17 DIAGNOSIS — Z87891 Personal history of nicotine dependence: Secondary | ICD-10-CM | POA: Diagnosis not present

## 2020-02-17 DIAGNOSIS — S7001XA Contusion of right hip, initial encounter: Secondary | ICD-10-CM | POA: Diagnosis not present

## 2020-02-17 DIAGNOSIS — J45909 Unspecified asthma, uncomplicated: Secondary | ICD-10-CM | POA: Insufficient documentation

## 2020-02-17 DIAGNOSIS — S5001XA Contusion of right elbow, initial encounter: Secondary | ICD-10-CM | POA: Diagnosis not present

## 2020-02-17 DIAGNOSIS — Z853 Personal history of malignant neoplasm of breast: Secondary | ICD-10-CM | POA: Insufficient documentation

## 2020-02-17 DIAGNOSIS — W1830XA Fall on same level, unspecified, initial encounter: Secondary | ICD-10-CM | POA: Insufficient documentation

## 2020-02-17 DIAGNOSIS — M25521 Pain in right elbow: Secondary | ICD-10-CM | POA: Diagnosis present

## 2020-02-17 DIAGNOSIS — Z79899 Other long term (current) drug therapy: Secondary | ICD-10-CM | POA: Diagnosis not present

## 2020-02-17 DIAGNOSIS — W19XXXA Unspecified fall, initial encounter: Secondary | ICD-10-CM

## 2020-02-17 DIAGNOSIS — I10 Essential (primary) hypertension: Secondary | ICD-10-CM | POA: Insufficient documentation

## 2020-02-17 DIAGNOSIS — R109 Unspecified abdominal pain: Secondary | ICD-10-CM | POA: Diagnosis not present

## 2020-02-17 DIAGNOSIS — E119 Type 2 diabetes mellitus without complications: Secondary | ICD-10-CM | POA: Diagnosis not present

## 2020-02-17 MED ORDER — HYDROCODONE-ACETAMINOPHEN 5-325 MG PO TABS
1.0000 | ORAL_TABLET | Freq: Once | ORAL | Status: AC
  Administered 2020-02-17: 1 via ORAL
  Filled 2020-02-17: qty 1

## 2020-02-17 MED ORDER — HYDROCODONE-ACETAMINOPHEN 5-325 MG PO TABS: 1.0000 | ORAL_TABLET | Freq: Four times a day (QID) | ORAL | 0 refills | Status: DC | PRN

## 2020-02-17 NOTE — ED Notes (Signed)
Pt asking for pain medication, provider made aware

## 2020-02-17 NOTE — ED Provider Notes (Signed)
[facility] EMERGENCY DEPARTMENT Provider Note   CSN: [id number] Arrival date & time: 02/17/20  1806     History Chief Complaint  Patient presents with  . Fall    Charlene Houston is a 74 y.o. female.  HPI Patient presents after fall.  Reportedly was picking up packages and fell backwards.  Landed rear and first into the grass and hit right elbow on some cement.  Complaining of pain in right elbow right cheek and buttock area.  Has been ambulatory since but still pain and buttock with ambulation.  Not on anticoagulation.  Did not hit head.  No neck pain.  No numbness or weakness.    Past Medical History:  Diagnosis Date  . Arthritis   . Asthma   . Breast cancer ([place])   . Diabetes mellitus without complication ([place])   . Hypertension   . SBO (small bowel obstruction) ([place]) 06/2018    Patient Active Problem List   Diagnosis Date Noted  . SBO (small bowel obstruction) ([place]) 07/06/2018    Past Surgical History:  Procedure Laterality Date  . ABDOMINAL HYSTERECTOMY    . THUMB AMPUTATION Left    PARTIAL  . TONSILLECTOMY       OB History   No obstetric history on file.     Family History  Problem Relation Age of Onset  . Hypertension Mother   . Diabetes Mother   . Cancer Father     Social History   Tobacco Use  . Smoking status: Former Smoker    Types: Cigarettes    Quit date: 1995    Years since quitting: 26.8  . Smokeless tobacco: Never Used  Vaping Use  . Vaping Use: Never used  Substance Use Topics  . Alcohol use: Not Currently  . Drug use: Not Currently    Home Medications Prior to Admission medications   Medication Sig Start Date End Date Taking? Authorizing Provider  acetaminophen (TYLENOL) 325 MG tablet Take 650 mg by mouth every 6 (six) hours as needed for mild pain or headache.    [provider]  albuterol (PROVENTIL HFA;VENTOLIN HFA) 108 (90 Base) MCG/ACT inhaler Inhale 1-2 puffs into the lungs every 6  (six) hours as needed for wheezing or shortness of breath.    [provider]  fluticasone (FLONASE) 50 MCG/ACT nasal spray Place 1 spray into both nostrils as needed for allergies or rhinitis.     [provider]  furosemide (LASIX) 20 MG tablet Take 1 tablet (20 mg total) by mouth daily. 07/12/18   Regalado, Belkys A, MD  gabapentin (NEURONTIN) 300 MG capsule Take 300 mg by mouth 4 (four) times daily. Taking twice daily 10/26/17   [provider]  HYDROcodone-acetaminophen (NORCO/VICODIN) 5-325 MG tablet Take 1-2 tablets by mouth every 6 (six) hours as needed. 02/17/20   Davonna Belling, MD  insulin detemir (LEVEMIR) 100 UNIT/ML injection Inject 5 Units into the skin at bedtime.     [provider]  ipratropium-albuterol (DUONEB) 0.5-2.5 (3) MG/3ML SOLN Take 3 mLs by nebulization 2 (two) times daily. 07/11/18   Regalado, Belkys A, MD  lisinopril (PRINIVIL,ZESTRIL) 20 MG tablet Take 20 mg by mouth daily. 04/11/18   [provider]  mirtazapine (REMERON) 15 MG tablet Take 1 tablet (15 mg total) by mouth at bedtime. 04/23/19   Robyn Haber, MD  mometasone-formoterol (DULERA) 200-5 MCG/ACT AERO Inhale 2 puffs into the lungs 2 (two) times daily. 07/11/18   Regalado, Cassie Freer, MD  pantoprazole (PROTONIX) 40 MG tablet Take 1 tablet (40 mg total) by mouth at bedtime. 07/11/18   Regalado, Belkys A, MD  potassium chloride SA (K-DUR,KLOR-CON) 20 MEQ tablet Take 20 mEq by mouth daily. 10/26/17   [provider]  pravastatin (PRAVACHOL) 40 MG tablet Take 40 mg by mouth daily.    [provider]  predniSONE (DELTASONE) 10 MG tablet Take 1 tablet (10 mg total) by mouth daily with breakfast. 04/23/19   Robyn Haber, MD  triamcinolone cream (KENALOG) 0.5 % Apply 1 application topically 3 (three) times daily. 10/06/17   [provider]  escitalopram (LEXAPRO) 5 MG tablet Take 5 mg by mouth daily. 06/15/18 04/23/19  [provider]     Allergies    Prozac [fluoxetine hcl] and Vit d-vit e-safflower oil  Review of Systems   Review of Systems  Constitutional: Negative for appetite change.  HENT: Negative for congestion.   Respiratory: Negative for shortness of breath.   Cardiovascular: Negative for chest pain.  Gastrointestinal: Negative for abdominal pain.  Genitourinary: Negative for flank pain.  Musculoskeletal:       Right elbow and shoulder pain.  Buttock/sacral pain.  Skin: Negative for rash.  Neurological: Negative for weakness and headaches.  Psychiatric/Behavioral: Negative for confusion.    Physical Exam Updated Vital Signs BP 125/70 (BP Location: Left Arm)   Pulse 90   Temp 99 F (37.2 C) (Oral)   Resp 18   Ht 5' 5.5" (1.664 m)   Wt 99.8 kg   SpO2 93%   BMI 36.05 kg/m   Physical Exam Vitals reviewed.  HENT:     Head: Normocephalic and atraumatic.  Eyes:     Pupils: Pupils are equal, round, and reactive to light.  Neck:     Comments: No cervical thoracic or lumbar tenderness. Pulmonary:     Effort: Pulmonary effort is normal.  Abdominal:     Tenderness: There is no abdominal tenderness.  Musculoskeletal:        General: Tenderness present.     Comments: Tenderness to right elbow and right shoulder.  Abrasion to right elbow.  Some pain with movement.  Some tenderness to right ankle right knee which states these are mild and chronic and unchanged since before the fall.  Does have her tenderness at lateral right hip which patient states is new.  Some tenderness over sacrum/coccyx area  Skin:    General: Skin is warm.     Capillary Refill: Capillary refill takes less than 2 seconds.  Neurological:     Mental Status: She is alert and oriented to person, place, and time.     ED Results / Procedures / Treatments   Labs (all labs ordered are listed, but only abnormal results are displayed) Labs Reviewed - No data to display  EKG None  Radiology DG Shoulder Right  Result Date:  02/17/2020 CLINICAL DATA:  Fall EXAM: RIGHT SHOULDER - 2+ VIEW COMPARISON:  None. FINDINGS: There is no evidence of fracture or dislocation. There is no evidence of arthropathy or other focal bone abnormality. Soft tissues are unremarkable. IMPRESSION: Negative. Electronically Signed   By: Prudencio Pair M.D.   On: 02/17/2020 21:51   DG Elbow 2 Views Right  Result Date: 02/17/2020 CLINICAL DATA:  Pain EXAM: RIGHT ELBOW - 2 VIEW COMPARISON:  None. FINDINGS: There is soft tissue swelling without evidence for an acute displaced fracture or dislocation. There is no elbow joint effusion. There are no significant degenerative changes. IMPRESSION: Soft tissue  swelling without evidence for an acute displaced fracture or dislocation. Electronically Signed   By: Amijah Holster M.D.   On: 02/17/2020 18:57   CT PELVIS WO CONTRAST  Result Date: 02/17/2020 CLINICAL DATA:  Pelvic trauma, fell backwards EXAM: CT PELVIS WITHOUT CONTRAST TECHNIQUE: Multidetector CT imaging of the pelvis was performed following the standard protocol without intravenous contrast. COMPARISON:  02/17/2020 FINDINGS: Urinary Tract:  No abnormality visualized. Bowel: Minimal diverticulosis of the sigmoid colon. No diverticulitis. No bowel obstruction or ileus. Normal appendix right lower quadrant. Vascular/Lymphatic: No pathologic adenopathy. Atherosclerosis is seen within the distal aorta and iliac vessels. Reproductive:  Uterus is surgically absent.  No adnexal masses. Other:  No free fluid or free gas.  Fat containing umbilical hernia. Musculoskeletal: No acute displaced fracture. Hips are well aligned. There is prominent spondylosis at L4-5 and L5-S1. Reconstructed images demonstrate no additional findings. IMPRESSION: 1. Lower lumbar spondylosis. 2. No acute pelvic fracture. 3. Diverticulosis without diverticulitis. Electronically Signed   By: Randa Ngo M.D.   On: 02/17/2020 22:44   DG Hip Unilat W or Wo Pelvis 2-3 Views  Right  Result Date: 02/17/2020 CLINICAL DATA:  Fall EXAM: DG HIP (WITH OR WITHOUT PELVIS) 2-3V RIGHT COMPARISON:  None. FINDINGS: There is no evidence of hip fracture or dislocation. 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 No focal soft tissue swelling is seen. IMPRESSION: No definite displaced fracture. If there is high clinical suspicion for occult hip fracture or the patient refuses to weightbear, consider further evaluation with MRI. Although CT is expeditious, evidence is lacking regarding accuracy of CT over plain film radiography. Electronically Signed   By: Prudencio Pair M.D.   On: 02/17/2020 21:56    Procedures Procedures (including critical care time)  Medications Ordered in ED Medications  HYDROcodone-acetaminophen (NORCO/VICODIN) 5-325 MG per tablet 1 tablet (1 tablet Oral Given 02/17/20 2317)    ED Course  I have reviewed the triage vital signs and the nursing notes.  Pertinent labs & imaging results that were available during my care of the patient were reviewed by me and considered in my medical decision making (see chart for details).    MDM Rules/Calculators/A&P                          Patient with fall. Mechanical. Abrasion elbow with pain on elbow shoulder and right hip. Imaging reassuring. Although CT scan done after x-ray due to sacral tenderness. Also reassuring. Has been able ambulate. Will discharge home with pain medicines. Discussed risk benefits of the pain treatment but will give a short course of Norco. Follow-up with her PCP. Doubt intracranial or intrathoracic or intra-abdominal injury. Discharge home. Final Clinical Impression(s) / ED Diagnoses Final diagnoses:  Fall, initial encounter  Contusion of right elbow, initial encounter  Contusion of right hip, initial encounter    Rx / DC Orders ED Discharge Orders         Ordered    HYDROcodone-acetaminophen (NORCO/VICODIN) 5-325 MG tablet  Every 6  hours PRN        02/17/20 2324           Davonna Belling, MD 02/17/20 2346

## 2020-02-17 NOTE — ED Notes (Signed)
Vitals are unable to be obtained at the moment due to pt getting scans

## 2020-02-17 NOTE — ED Triage Notes (Signed)
Pt coming from home. Pt was picking up package and fell backwards. Pt landed on bottom onto grass. Pt did hit right elbow in concrete. VSS. NAD.

## 2020-02-18 NOTE — ED Notes (Signed)
Discharge instructions discussed with pt. Pt verbalized understanding. Pt stable and ambulatory. No signature pad available. 

## 2020-04-22 ENCOUNTER — Emergency Department (HOSPITAL_COMMUNITY): Payer: Medicare HMO

## 2020-04-22 ENCOUNTER — Observation Stay (HOSPITAL_COMMUNITY)
Admission: EM | Admit: 2020-04-22 | Discharge: 2020-04-23 | Disposition: A | Discharge status: Home / Self Care | Payer: Medicare HMO | Attending: Internal Medicine | Admitting: Internal Medicine

## 2020-04-22 ENCOUNTER — Encounter (HOSPITAL_COMMUNITY): Payer: Self-pay | Admitting: Emergency Medicine

## 2020-04-22 DIAGNOSIS — Z853 Personal history of malignant neoplasm of breast: Secondary | ICD-10-CM | POA: Insufficient documentation

## 2020-04-22 DIAGNOSIS — Y92009 Unspecified place in unspecified non-institutional (private) residence as the place of occurrence of the external cause: Secondary | ICD-10-CM | POA: Diagnosis not present

## 2020-04-22 DIAGNOSIS — M6282 Rhabdomyolysis: Secondary | ICD-10-CM | POA: Diagnosis not present

## 2020-04-22 DIAGNOSIS — U071 COVID-19: Secondary | ICD-10-CM | POA: Diagnosis not present

## 2020-04-22 DIAGNOSIS — E785 Hyperlipidemia, unspecified: Secondary | ICD-10-CM | POA: Diagnosis present

## 2020-04-22 DIAGNOSIS — E162 Hypoglycemia, unspecified: Secondary | ICD-10-CM

## 2020-04-22 DIAGNOSIS — Z794 Long term (current) use of insulin: Secondary | ICD-10-CM | POA: Diagnosis not present

## 2020-04-22 DIAGNOSIS — E119 Type 2 diabetes mellitus without complications: Secondary | ICD-10-CM

## 2020-04-22 DIAGNOSIS — T796XXA Traumatic ischemia of muscle, initial encounter: Secondary | ICD-10-CM | POA: Insufficient documentation

## 2020-04-22 DIAGNOSIS — J45909 Unspecified asthma, uncomplicated: Secondary | ICD-10-CM | POA: Diagnosis not present

## 2020-04-22 DIAGNOSIS — W010XXA Fall on same level from slipping, tripping and stumbling without subsequent striking against object, initial encounter: Secondary | ICD-10-CM | POA: Insufficient documentation

## 2020-04-22 DIAGNOSIS — E11649 Type 2 diabetes mellitus with hypoglycemia without coma: Secondary | ICD-10-CM | POA: Insufficient documentation

## 2020-04-22 DIAGNOSIS — Z87891 Personal history of nicotine dependence: Secondary | ICD-10-CM | POA: Insufficient documentation

## 2020-04-22 DIAGNOSIS — I10 Essential (primary) hypertension: Secondary | ICD-10-CM | POA: Diagnosis not present

## 2020-04-22 DIAGNOSIS — Z79899 Other long term (current) drug therapy: Secondary | ICD-10-CM | POA: Diagnosis not present

## 2020-04-22 DIAGNOSIS — R531 Weakness: Secondary | ICD-10-CM

## 2020-04-22 DIAGNOSIS — G4730 Sleep apnea, unspecified: Secondary | ICD-10-CM | POA: Diagnosis present

## 2020-04-22 LAB — CBC WITH DIFFERENTIAL/PLATELET
Abs Immature Granulocytes: 0.03 10*3/uL (ref 0.00–0.07)
Basophils Absolute: 0 10*3/uL (ref 0.0–0.1)
Basophils Relative: 0 %
Eosinophils Absolute: 0 10*3/uL (ref 0.0–0.5)
Eosinophils Relative: 0 %
HCT: 36.1 % (ref 36.0–46.0)
Hemoglobin: 11.5 g/dL — ABNORMAL LOW (ref 12.0–15.0)
Immature Granulocytes: 1 %
Lymphocytes Relative: 22 %
Lymphs Abs: 1.3 10*3/uL (ref 0.7–4.0)
MCH: 31.9 pg (ref 26.0–34.0)
MCHC: 31.9 g/dL (ref 30.0–36.0)
MCV: 100 fL (ref 80.0–100.0)
Monocytes Absolute: 0.4 10*3/uL (ref 0.1–1.0)
Monocytes Relative: 7 %
Neutro Abs: 4 10*3/uL (ref 1.7–7.7)
Neutrophils Relative %: 70 %
Platelets: 219 10*3/uL (ref 150–400)
RBC: 3.61 MIL/uL — ABNORMAL LOW (ref 3.87–5.11)
RDW: 12.4 % (ref 11.5–15.5)
WBC: 5.7 10*3/uL (ref 4.0–10.5)
nRBC: 0 % (ref 0.0–0.2)

## 2020-04-22 LAB — COMPREHENSIVE METABOLIC PANEL
ALT: 17 U/L (ref 0–44)
AST: 94 U/L — ABNORMAL HIGH (ref 15–41)
Albumin: 2.8 g/dL — ABNORMAL LOW (ref 3.5–5.0)
Alkaline Phosphatase: 31 U/L — ABNORMAL LOW (ref 38–126)
Anion gap: 12 (ref 5–15)
BUN: 11 mg/dL (ref 8–23)
CO2: 22 mmol/L (ref 22–32)
Calcium: 8.3 mg/dL — ABNORMAL LOW (ref 8.9–10.3)
Chloride: 102 mmol/L (ref 98–111)
Creatinine, Ser: 0.86 mg/dL (ref 0.44–1.00)
GFR, Estimated: >60 mL/min (ref >60)
Glucose, Bld: 202 mg/dL — ABNORMAL HIGH (ref 70–99)
Potassium: 5 mmol/L (ref 3.5–5.1)
Sodium: 136 mmol/L (ref 135–145)
Total Bilirubin: 1.1 mg/dL (ref 0.3–1.2)
Total Protein: 5.7 g/dL — ABNORMAL LOW (ref 6.5–8.1)

## 2020-04-22 LAB — URINALYSIS, ROUTINE W REFLEX MICROSCOPIC
Bilirubin Urine: NEGATIVE
Glucose, UA: NEGATIVE mg/dL
Hgb urine dipstick: NEGATIVE
Ketones, ur: NEGATIVE mg/dL
Nitrite: NEGATIVE
Protein, ur: NEGATIVE mg/dL
Specific Gravity, Urine: 1.006 (ref 1.005–1.030)
pH: 5 (ref 5.0–8.0)

## 2020-04-22 LAB — CBG MONITORING, ED
Glucose-Capillary: 152 mg/dL — ABNORMAL HIGH (ref 70–99)
Glucose-Capillary: 48 mg/dL — ABNORMAL LOW (ref 70–99)
Glucose-Capillary: 173 mg/dL — ABNORMAL HIGH (ref 70–99)

## 2020-04-22 LAB — LIPASE, BLOOD: Lipase: 42 U/L (ref 11–51)

## 2020-04-22 LAB — SARS CORONAVIRUS 2 (TAT 6-24 HRS): SARS Coronavirus 2: POSITIVE — AB

## 2020-04-22 LAB — CK: Total CK: 6301 U/L — ABNORMAL HIGH (ref 38–234)

## 2020-04-22 MED ORDER — HYDRALAZINE HCL 20 MG/ML IJ SOLN: 10.0000 mg | INTRAMUSCULAR | Status: DC | PRN

## 2020-04-22 MED ORDER — ALBUTEROL SULFATE HFA 108 (90 BASE) MCG/ACT IN AERS
2.0000 | INHALATION_SPRAY | Freq: Four times a day (QID) | RESPIRATORY_TRACT | Status: DC | PRN
  Filled 2020-04-22: qty 6.7

## 2020-04-22 MED ORDER — ENOXAPARIN SODIUM 40 MG/0.4ML ~~LOC~~ SOLN
40.0000 mg | SUBCUTANEOUS | Status: DC
  Administered 2020-04-22: 40 mg via SUBCUTANEOUS
  Filled 2020-04-22: qty 0.4

## 2020-04-22 MED ORDER — MOMETASONE FURO-FORMOTEROL FUM 200-5 MCG/ACT IN AERO
2.0000 | INHALATION_SPRAY | Freq: Two times a day (BID) | RESPIRATORY_TRACT | Status: DC
  Filled 2020-04-22: qty 8.8

## 2020-04-22 MED ORDER — DEXTROSE 50 % IV SOLN
1.0000 | Freq: Once | INTRAVENOUS | Status: AC
  Administered 2020-04-22: 50 mL via INTRAVENOUS
  Filled 2020-04-22: qty 50

## 2020-04-22 MED ORDER — DEXTROSE-NACL 5-0.9 % IV SOLN: INTRAVENOUS | Status: DC

## 2020-04-22 MED ORDER — LACTATED RINGERS IV BOLUS
1000.0000 mL | Freq: Once | INTRAVENOUS | Status: AC
  Administered 2020-04-22: 1000 mL via INTRAVENOUS

## 2020-04-22 MED ORDER — DEXTROSE 5 % IV SOLN: Freq: Once | INTRAVENOUS | Status: AC

## 2020-04-22 NOTE — ED Notes (Signed)
Pt given apple juice and crackers.  

## 2020-04-22 NOTE — ED Triage Notes (Signed)
Pt arrives via [facility] from home with a chief complaint of a fall that occurred yesterday and low blood sugar in the 60's. Per ems pt had a fall yesterday and was on the ground for a prolonged period of time and had to wait until her daughter helped her up. Today daughter found pt in the bed and seemed altered. On arrival pts BS was 60 and ems administered D10.

## 2020-04-22 NOTE — ED Notes (Signed)
Pt to xray

## 2020-04-22 NOTE — ED Notes (Signed)
Pt was incontinent of urine. Pt cleaned and placed on clean sheets. Unable to collect urine sample at this time.

## 2020-04-22 NOTE — H&P (Addendum)
History and Physical    Charlene Houston QMV:[id number] DOB: September 30, 1945 DOA: 04/22/2020  PCP: Lilian Coma., MD  Patient coming from: Home.  Chief Complaint: Confusion.  HPI: Charlene Houston is a 75 y.o. female with known history of diabetes mellitus type 2, hypertension, hyperlipidemia, asthma and chronic LBBB has been having some diarrhea last 2 days.  Patient states 2 days ago when she had the first episode of diarrhea she was trying to speeding up to the bathroom but she had an accident.  Later she was trying to clean the floor and in doing that patient slipped and fell onto the floor and was unable to get up because of the weakness.  Patient states she did not hit her head or lose consciousness.  She remained on the floor for almost 24 hours and later somehow managed to get up and reach to the phone and called her daughter.  Patient's daughter came and checked on her and helped her to the bed.  The following day patient's daughter found that patient was confused and EMS was called and patient was found to be hypoglycemic.  ED Course: In the ER patient appears nonfocal but generally weak with labs showing CK levels of 6300.  EKG shows LBBB chest x-ray does not show any acute x-ray of the T-spine was nonacute.  Patient has pain on moving all extremities but appears nonfocal.  Labs are significant for mild anemia.  AST is mildly elevated.  COVID test eventually came positive.  Patient was not hypoxic.  Patient remained hypoglycemic was given D50 twice and has to be started on D5W.  Patient states she has not been eating well for last 2 days.  Review of Systems: As per HPI, rest all negative.   Past Medical History:  Diagnosis Date  . Arthritis   . Asthma   . Breast cancer ([place])   . Diabetes mellitus without complication ([place])   . Hypertension   . SBO (small bowel obstruction) ([place]) 06/2018    Past Surgical History:  Procedure Laterality Date  . ABDOMINAL HYSTERECTOMY     . THUMB AMPUTATION Left    PARTIAL  . TONSILLECTOMY       reports that she quit smoking about 27 years ago. Her smoking use included cigarettes. She has never used smokeless tobacco. She reports previous alcohol use. She reports previous drug use.  Allergies  Allergen Reactions  . Prozac [Fluoxetine Hcl] Other (See Comments)    Gittery  . Vit D-Vit E-Safflower Oil Other (See Comments)    Not specified    Family History  Problem Relation Age of Onset  . Hypertension Mother   . Diabetes Mother   . Cancer Father     Prior to Admission medications   Medication Sig Start Date End Date Taking? Authorizing Provider  acetaminophen (TYLENOL) 325 MG tablet Take 650 mg by mouth every 6 (six) hours as needed for mild pain or headache.    [provider]  albuterol (PROVENTIL HFA;VENTOLIN HFA) 108 (90 Base) MCG/ACT inhaler Inhale 1-2 puffs into the lungs every 6 (six) hours as needed for wheezing or shortness of breath.    [provider]  fluticasone (FLONASE) 50 MCG/ACT nasal spray Place 1 spray into both nostrils as needed for allergies or rhinitis.     [provider]  furosemide (LASIX) 20 MG tablet Take 1 tablet (20 mg total) by mouth daily. 07/12/18   Regalado, Belkys A, MD  gabapentin (NEURONTIN) 300 MG capsule  Take 300 mg by mouth 4 (four) times daily. Taking twice daily 10/26/17   [provider]  HYDROcodone-acetaminophen (NORCO/VICODIN) 5-325 MG tablet Take 1-2 tablets by mouth every 6 (six) hours as needed. 02/17/20   Davonna Belling, MD  insulin detemir (LEVEMIR) 100 UNIT/ML injection Inject 5 Units into the skin at bedtime.     [provider]  ipratropium-albuterol (DUONEB) 0.5-2.5 (3) MG/3ML SOLN Take 3 mLs by nebulization 2 (two) times daily. 07/11/18   Regalado, Belkys A, MD  lisinopril (PRINIVIL,ZESTRIL) 20 MG tablet Take 20 mg by mouth daily. 04/11/18   [provider]  mirtazapine (REMERON) 15 MG tablet Take 1 tablet (15  mg total) by mouth at bedtime. 04/23/19   Robyn Haber, MD  mometasone-formoterol (DULERA) 200-5 MCG/ACT AERO Inhale 2 puffs into the lungs 2 (two) times daily. 07/11/18   Regalado, Belkys A, MD  pantoprazole (PROTONIX) 40 MG tablet Take 1 tablet (40 mg total) by mouth at bedtime. 07/11/18   Regalado, Belkys A, MD  potassium chloride SA (K-DUR,KLOR-CON) 20 MEQ tablet Take 20 mEq by mouth daily. 10/26/17   [provider]  pravastatin (PRAVACHOL) 40 MG tablet Take 40 mg by mouth daily.    [provider]  predniSONE (DELTASONE) 10 MG tablet Take 1 tablet (10 mg total) by mouth daily with breakfast. 04/23/19   Robyn Haber, MD  triamcinolone cream (KENALOG) 0.5 % Apply 1 application topically 3 (three) times daily. 10/06/17   [provider]  escitalopram (LEXAPRO) 5 MG tablet Take 5 mg by mouth daily. 06/15/18 04/23/19  [provider]    Physical Exam: Constitutional: Moderately built and nourished. Vitals:   04/22/20 1718 04/22/20 1918 04/22/20 2124  BP: (!) 124/56 129/63 (!) 121/58  Pulse: 94 87 85  Resp: (!) 22 (!) 21 15  SpO2: 94% 94% 98%   Eyes: Anicteric no pallor. ENMT: No discharge from the ears eyes nose and mouth. Neck: No mass felt.  No neck rigidity. Respiratory: No rhonchi or crepitations. Cardiovascular: S1-S2 heard. Abdomen: Soft nontender bowel sounds present. Musculoskeletal: No edema. Skin: No rash. Neurologic: Alert awake oriented to time place and person.  Moves all extremities. Psychiatric: Appears normal.  Normal affect.   Labs on Admission: I have personally reviewed following labs and imaging studies  CBC: Recent Labs  Lab 04/22/20 1800  WBC 5.7  NEUTROABS 4.0  HGB 11.5*  HCT 36.1  MCV 100.0  PLT 354   Basic Metabolic Panel: Recent Labs  Lab 04/22/20 1800  NA 136  K 5.0  CL 102  CO2 22  GLUCOSE 202*  BUN 11  CREATININE 0.86  CALCIUM 8.3*   GFR: CrCl cannot be calculated (Unknown ideal weight.). Liver  Function Tests: Recent Labs  Lab 04/22/20 1800  AST 94*  ALT 17  ALKPHOS 31*  BILITOT 1.1  PROT 5.7*  ALBUMIN 2.8*   Recent Labs  Lab 04/22/20 1800  LIPASE 42   No results for input(s): AMMONIA in the last 168 hours. Coagulation Profile: No results for input(s): INR, PROTIME in the last 168 hours. Cardiac Enzymes: Recent Labs  Lab 04/22/20 1800  CKTOTAL 6,301*   BNP (last 3 results) No results for input(s): PROBNP in the last 8760 hours. HbA1C: No results for input(s): HGBA1C in the last 72 hours. CBG: Recent Labs  Lab 04/22/20 1746 04/22/20 2208  GLUCAP 152* 48*   Lipid Profile: No results for input(s): CHOL, HDL, LDLCALC, TRIG, CHOLHDL, LDLDIRECT in the last 72 hours. Thyroid Function Tests:  No results for input(s): TSH, T4TOTAL, FREET4, T3FREE, THYROIDAB in the last 72 hours. Anemia Panel: No results for input(s): VITAMINB12, FOLATE, FERRITIN, TIBC, IRON, RETICCTPCT in the last 72 hours. Urine analysis:    Component Value Date/Time   COLORURINE YELLOW 04/22/2020 1955   APPEARANCEUR HAZY (A) 04/22/2020 1955   LABSPEC 1.006 04/22/2020 1955   PHURINE 5.0 04/22/2020 1955   GLUCOSEU NEGATIVE 04/22/2020 1955   HGBUR NEGATIVE 04/22/2020 1955   BILIRUBINUR NEGATIVE 04/22/2020 [place] NEGATIVE 04/22/2020 1955   PROTEINUR NEGATIVE 04/22/2020 1955   NITRITE NEGATIVE 04/22/2020 1955   LEUKOCYTESUR SMALL (A) 04/22/2020 1955   Sepsis Labs: @LABRCNTIP (procalcitonin:4,lacticidven:4) ) Recent Results (from the past 240 hour(s))  SARS CORONAVIRUS 2 (TAT 6-24 HRS) Nasopharyngeal Nasopharyngeal Swab     Status: Abnormal   Collection Time: 04/22/20  6:18 PM   Specimen: Nasopharyngeal Swab  Result Value Ref Range Status   SARS Coronavirus 2 POSITIVE (A) NEGATIVE Final    Comment: (NOTE) SARS-CoV-2 target nucleic acids are DETECTED.  The SARS-CoV-2 RNA is generally detectable in upper and lower respiratory specimens during the acute phase of infection.  Positive results are indicative of the presence of SARS-CoV-2 RNA. Clinical correlation with patient history and other diagnostic information is  necessary to determine patient infection status. Positive results do not rule out bacterial infection or co-infection with other viruses.  The expected result is Negative.  Fact Sheet for Patients: SugarRoll.be  Fact Sheet for Healthcare Providers: https://www.woods-mathews.com/  This test is not yet approved or cleared by the [place] FDA and  has been authorized for detection and/or diagnosis of SARS-CoV-2 by FDA under an Emergency Use Authorization (EUA). This EUA will remain  in effect (meaning this test can be used) for the duration of the COVID-19 declaration under Section 564(b)(1) of the Act, 21 U. S.C. section 360bbb-3(b)(1), unless the authorization is terminated or revoked sooner.   Performed at [facility], [place] [address], [place], [place] [address]      Radiological Exams on Admission: DG Chest 2 View  Result Date: 04/22/2020 CLINICAL DATA:  Cough, upper back pain EXAM: CHEST - 2 VIEW COMPARISON:  07/10/2018 FINDINGS: Cardiomegaly. Small nodular density projects over the right upper lobe which appears more prominent than prior study. No confluent opacities otherwise. No effusions or edema. No acute bony abnormality. IMPRESSION: Cardiomegaly. Nodular density projects over the right upper lobe. This could be further evaluated with chest CT. Electronically Signed   By: Rolm Baptise M.D.   On: 04/22/2020 21:30   DG Thoracic Spine 2 View  Result Date: 04/22/2020 CLINICAL DATA:  Cough and upper back pain EXAM: THORACIC SPINE 2 VIEWS COMPARISON:  None. FINDINGS: There is no evidence of thoracic spine fracture. Alignment is normal. No other significant bone abnormalities are identified. IMPRESSION: Negative. Electronically Signed   By: Ulyses Jarred M.D.   On: 04/22/2020 21:30     EKG: Independently reviewed.  Normal sinus rhythm LBBB.  Assessment/Plan Principal Problem:   Hypoglycemia Active Problems:   Rhabdomyolysis   Essential hypertension   HLD (hyperlipidemia)   Sleep apnea   Diabetes mellitus type 2 in nonobese ([place])    1. COVID-19 infection presently not hypoxic but generally weak with diarrhea likely from COVID.  We will start patient on remdesivir and closely observe patient's respiratory status and inflammatory markers. 2. Diarrhea and poor appetite could be from COVID infection.  Closely monitor. 3. Hypoglycemia: Poor oral intake for which patient has been started on  D5W.  Closely monitor CBGs for now.  Patient does not take any insulin and only takes metformin. 4. Hypertension we will keep patient on as needed IV hydralazine for now until we confirm home medications. 5. History of asthma presently not wheezing. 6. Rhabdomyolysis from fall presently on IV fluid follow CK levels.  Holding statins. 7. Mildly elevated LFTs could be from rhabdomyolysis.  Could also be from [place].  Holding statins for now given that patient also has elevated CK levels. 8. Chronic LBBB.   DVT prophylaxis: Lovenox. Code Status: Full code. Family Communication: Discussed with patient.  I was unable to reach patient's daughter. Disposition Plan: To be determined. Consults called: Physical therapy. Admission status: Observation.   Rise Patience MD [facility] Pager [phone].  If 7PM-7AM, please contact night-coverage [email or web address] Password [facility]  04/22/2020, 11:03 PM

## 2020-04-22 NOTE — ED Provider Notes (Signed)
[facility] EMERGENCY DEPARTMENT Provider Note   CSN: [id number] Arrival date & time: 04/22/20  1641     History Chief Complaint  Patient presents with  . Hypoglycemia    FALL     Charlene Houston is a 75 y.o. female.  Patient is a 75 year old female with a history of diabetes, hypertension and asthma who is presenting today due to decreased responsiveness and hypoglycemia.  Patient reported on Sunday night and early Monday morning she had gotten out of bed to get a drink of water from her fridge and she slipped down to the floor between the bed and the refrigerator and was unable to get up.  She reported that she crawled and scooted around on the floor for approximately 24 hours and was finally able to get to her phone when she called her daughter at 11:30 PM last night and her daughter came over and found her halfway on the mattress on the bed and she helped her get up and get back into the bed.  Patient was able to stand and ambulate after that but was not interested in eating.  Daughter reports she did drink some water and she slept throughout the night.  This morning she was able to get out of bed and go to the bathroom independently but reported she had significant diffuse body pain at that time.  She is having some localized mid back pain as well.  She denies any numbness in her arms or legs but just feels generally weak and worn out.  She spoke with her daughter around 11:00 and reported she was going to take some pain medication that is prescribed for her because the pain was not controlled with Tylenol.  She also took her blood sugar medication but again reported she was not interested in eating.  When daughter got to the home around 1:00 she was in bed sleeping and difficult to arouse.  Daughter reports she would open her eyes but then immediately will go back to sleep and would be snoring and drooling out of 1 side of her mouth.  She initially thought it was  related to the medication for pain but after approximately an hour when she was not improving she was concerned it may be her blood sugar because she had not eaten anything.  When paramedics arrived patient was pale and diaphoretic.  Blood sugar they reported was 60.  Patient received 2 rounds of D10 and daughter reports she returned to her normal mental status.  Patient denies any nausea, chest pain, shortness of breath or abdominal pain.  She does complain of having generalized pain and not having an appetite.  She denies any recent cough or illness and reports the reason why she fell was her knees just gave out which they tend to do frequently.  She does not take any anticoagulation.  She did not hit her head or lose consciousness.  She denies getting the COVID-vaccine and has not had any known sick contacts recently.  The history is provided by the patient and a relative.  Hypoglycemia Initial blood sugar:  60 Severity:  Severe Onset quality:  Gradual Timing:  Constant Progression:  Resolved Chronicity:  New Diabetic status:  Controlled with oral medications and controlled with insulin Current diabetic therapy:  Levemir at bedtime which took last on sunday night and oral agents which she took today Context: decreased oral intake   Relieved by:  IV glucose Ineffective treatments:  None tried Associated symptoms:  decreased responsiveness        Past Medical History:  Diagnosis Date  . Arthritis   . Asthma   . Breast cancer ([place])   . Diabetes mellitus without complication ([place])   . Hypertension   . SBO (small bowel obstruction) ([place]) 06/2018    Patient Active Problem List   Diagnosis Date Noted  . SBO (small bowel obstruction) ([place]) 07/06/2018    Past Surgical History:  Procedure Laterality Date  . ABDOMINAL HYSTERECTOMY    . THUMB AMPUTATION Left    PARTIAL  . TONSILLECTOMY       OB History   No obstetric history on file.     Family History  Problem Relation Age of  Onset  . Hypertension Mother   . Diabetes Mother   . Cancer Father     Social History   Tobacco Use  . Smoking status: Former Smoker    Types: Cigarettes    Quit date: 1995    Years since quitting: 27.0  . Smokeless tobacco: Never Used  Vaping Use  . Vaping Use: Never used  Substance Use Topics  . Alcohol use: Not Currently  . Drug use: Not Currently    Home Medications Prior to Admission medications   Medication Sig Start Date End Date Taking? Authorizing Provider  acetaminophen (TYLENOL) 325 MG tablet Take 650 mg by mouth every 6 (six) hours as needed for mild pain or headache.    [provider]  albuterol (PROVENTIL HFA;VENTOLIN HFA) 108 (90 Base) MCG/ACT inhaler Inhale 1-2 puffs into the lungs every 6 (six) hours as needed for wheezing or shortness of breath.    [provider]  fluticasone (FLONASE) 50 MCG/ACT nasal spray Place 1 spray into both nostrils as needed for allergies or rhinitis.     [provider]  furosemide (LASIX) 20 MG tablet Take 1 tablet (20 mg total) by mouth daily. 07/12/18   Regalado, Belkys A, MD  gabapentin (NEURONTIN) 300 MG capsule Take 300 mg by mouth 4 (four) times daily. Taking twice daily 10/26/17   [provider]  HYDROcodone-acetaminophen (NORCO/VICODIN) 5-325 MG tablet Take 1-2 tablets by mouth every 6 (six) hours as needed. 02/17/20   Davonna Belling, MD  insulin detemir (LEVEMIR) 100 UNIT/ML injection Inject 5 Units into the skin at bedtime.     [provider]  ipratropium-albuterol (DUONEB) 0.5-2.5 (3) MG/3ML SOLN Take 3 mLs by nebulization 2 (two) times daily. 07/11/18   Regalado, Belkys A, MD  lisinopril (PRINIVIL,ZESTRIL) 20 MG tablet Take 20 mg by mouth daily. 04/11/18   [provider]  mirtazapine (REMERON) 15 MG tablet Take 1 tablet (15 mg total) by mouth at bedtime. 04/23/19   Robyn Haber, MD  mometasone-formoterol (DULERA) 200-5 MCG/ACT AERO Inhale 2 puffs into the lungs 2  (two) times daily. 07/11/18   Regalado, Belkys A, MD  pantoprazole (PROTONIX) 40 MG tablet Take 1 tablet (40 mg total) by mouth at bedtime. 07/11/18   Regalado, Belkys A, MD  potassium chloride SA (K-DUR,KLOR-CON) 20 MEQ tablet Take 20 mEq by mouth daily. 10/26/17   [provider]  pravastatin (PRAVACHOL) 40 MG tablet Take 40 mg by mouth daily.    [provider]  predniSONE (DELTASONE) 10 MG tablet Take 1 tablet (10 mg total) by mouth daily with breakfast. 04/23/19   Robyn Haber, MD  triamcinolone cream (KENALOG) 0.5 % Apply 1 application topically 3 (three) times daily. 10/06/17   [provider]  escitalopram (LEXAPRO) 5 MG tablet  Take 5 mg by mouth daily. 06/15/18 04/23/19  [provider]    Allergies    Prozac [fluoxetine hcl] and Vit d-vit e-safflower oil  Review of Systems   Review of Systems  Constitutional: Positive for decreased responsiveness.  All other systems reviewed and are negative.   Physical Exam Updated Vital Signs BP (!) 124/56 (BP Location: Right Arm)   Pulse 94   Resp (!) 22   SpO2 94%   Physical Exam Vitals and nursing note reviewed.  Constitutional:      General: She is not in acute distress.    Appearance: Normal appearance. She is well-developed and well-nourished. She is obese.  HENT:     Head: Normocephalic and atraumatic.     Nose: Nose normal.     Mouth/Throat:     Mouth: Mucous membranes are moist.  Eyes:     Extraocular Movements: EOM normal.     Pupils: Pupils are equal, round, and reactive to light.  Cardiovascular:     Rate and Rhythm: Normal rate and regular rhythm.     Pulses: Intact distal pulses.     Heart sounds: Normal heart sounds. No murmur heard. No friction rub.  Pulmonary:     Effort: Pulmonary effort is normal.     Breath sounds: Normal breath sounds. No wheezing or rales.  Abdominal:     General: Bowel sounds are normal. There is no distension.     Palpations: Abdomen is soft.      Tenderness: There is abdominal tenderness in the epigastric area. There is no guarding or rebound.     Comments: Mild epigastric tenderness  Musculoskeletal:        General: Normal range of motion.     Cervical back: Normal, normal range of motion and neck supple.     Thoracic back: Tenderness and bony tenderness present. Normal range of motion.       Back:     Right lower leg: No edema.     Left lower leg: No edema.     Comments: No edema.  Full range of motion of bilateral lower extremities without focal point tenderness.  Skin:    General: Skin is warm and dry.     Findings: No rash.  Neurological:     General: No focal deficit present.     Mental Status: She is alert and oriented to person, place, and time. Mental status is at baseline.     Cranial Nerves: No cranial nerve deficit.     Sensory: No sensory deficit.     Motor: No weakness.  Psychiatric:        Mood and Affect: Mood and affect and mood normal.        Behavior: Behavior normal.        Thought Content: Thought content normal.     ED Results / Procedures / Treatments   Labs (all labs ordered are listed, but only abnormal results are displayed) Labs Reviewed  CBC WITH DIFFERENTIAL/PLATELET - Abnormal; Notable for the following components:      Result Value   RBC 3.61 (*)    Hemoglobin 11.5 (*)    All other components within normal limits  COMPREHENSIVE METABOLIC PANEL - Abnormal; Notable for the following components:   Glucose, Bld 202 (*)    Calcium 8.3 (*)    Total Protein 5.7 (*)    Albumin 2.8 (*)    AST 94 (*)    Alkaline Phosphatase 31 (*)    All  other components within normal limits  CK - Abnormal; Notable for the following components:   Total CK 6,301 (*)    All other components within normal limits  URINALYSIS, ROUTINE W REFLEX MICROSCOPIC - Abnormal; Notable for the following components:   APPearance HAZY (*)    Leukocytes,Ua SMALL (*)    Bacteria, UA MANY (*)    All other components within  normal limits  CBG MONITORING, ED - Abnormal; Notable for the following components:   Glucose-Capillary 152 (*)    All other components within normal limits  CBG MONITORING, ED - Abnormal; Notable for the following components:   Glucose-Capillary 48 (*)    All other components within normal limits  SARS CORONAVIRUS 2 (TAT 6-24 HRS)  LIPASE, BLOOD    EKG EKG Interpretation  Date/Time:  Tuesday April 22 2020 18:34:29 EST Ventricular Rate:  88 PR Interval:  138 QRS Duration: 148 QT Interval:  414 QTC Calculation: 500 R Axis:   -49 Text Interpretation: Sinus rhythm with occasional Premature ventricular complexes Left axis deviation Left bundle branch block No significant change since last tracing Confirmed by Blanchie Dessert [phone]) on 04/22/2020 8:09:37 PM   Radiology DG Chest 2 View  Result Date: 04/22/2020 CLINICAL DATA:  Cough, upper back pain EXAM: CHEST - 2 VIEW COMPARISON:  07/10/2018 FINDINGS: Cardiomegaly. Small nodular density projects over the right upper lobe which appears more prominent than prior study. No confluent opacities otherwise. No effusions or edema. No acute bony abnormality. IMPRESSION: Cardiomegaly. Nodular density projects over the right upper lobe. This could be further evaluated with chest CT. Electronically Signed   By: Rolm Baptise M.D.   On: 04/22/2020 21:30   DG Thoracic Spine 2 View  Result Date: 04/22/2020 CLINICAL DATA:  Cough and upper back pain EXAM: THORACIC SPINE 2 VIEWS COMPARISON:  None. FINDINGS: There is no evidence of thoracic spine fracture. Alignment is normal. No other significant bone abnormalities are identified. IMPRESSION: Negative. Electronically Signed   By: Ulyses Jarred M.D.   On: 04/22/2020 21:30    Procedures Procedures (including critical care time)  Medications Ordered in ED Medications  lactated ringers bolus 1,000 mL (has no administration in time range)    ED Course  I have reviewed the triage vital signs and the  nursing notes.  Pertinent labs & imaging results that were available during my care of the patient were reviewed by me and considered in my medical decision making (see chart for details).    MDM Rules/Calculators/A&P                          Patient presenting today with hypoglycemia in the setting of being on the floor for 24 hours yesterday.  Patient denies head injury or loss of consciousness.  Currently mental status is at baseline and normal.  Patient did have a blood sugar of 60 upon EMS arrival and she was lethargic and only partially responsive.  She received D10 x2 and had return to normal mental status.  Patient did take her diabetic medication this morning and had not eaten.  She reports she just did not have much of an appetite.  Prior to the fall early Monday morning she had been feeling well.  She is having some thoracic point tenderness and concern for possible injury of her back.  Plain films are pending to rule out compression fracture.  She is neurologically intact and low suspicion for stroke or lower extremity fracture.  Concern for possible rhabdomyolysis, AKI, acute cardiac event or COVID.  Will give IV fluids and continue to monitor her blood sugar.  Labs and imaging are pending.  No reported hypoxia by EMS and patient is not appear hypoxic here.  She is able to speak in full sentences and not in any distress.  9:40 PM CBC is within normal limits, UA without acute findings, CMP without acute findings except for mild elevated AST of 94.  Blood sugar of 202.  Anion gap is normal.  Patient CK is elevated today at 6301.  Chest x-ray shows cardiomegaly and a nodular density projecting over the right upper lobe they recommended CT for further evaluation which patient can have in the future but does not seem pertinent at this time.  Thoracic imaging is negative for acute fracture.  On repeat evaluation patient still is refusing to eat reporting that she is not hungry.  She is drinking  some water.  Daughter is concerned because patient lives alone her bedroom is upstairs and she was very weak and unable to walk down the stairs today to even go into the lower part of the house.  COVID is still pending.  10:13 PM Repeat sugar was 48. Patient still refusing to eat. Given recurrent hypoglycemia, rhabdomyolysis and generalized weakness will admit patient for further care. COVID is still pending.  CRITICAL CARE Performed by: Audrianna Driskill Total critical care time: 30 minutes Critical care time was exclusive of separately billable procedures and treating other patients. Critical care was necessary to treat or prevent imminent or life-threatening deterioration. Critical care was time spent personally by me on the following activities: development of treatment plan with patient and/or surrogate as well as nursing, discussions with consultants, evaluation of patient's response to treatment, examination of patient, obtaining history from patient or surrogate, ordering and performing treatments and interventions, ordering and review of laboratory studies, ordering and review of radiographic studies, pulse oximetry and re-evaluation of patient's condition.  MDM Number of Diagnoses or Management Options   Amount and/or Complexity of Data Reviewed Clinical lab tests: ordered and reviewed Tests in the radiology section of CPT: ordered and reviewed Tests in the medicine section of CPT: ordered and reviewed Decide to obtain previous medical records or to obtain history from someone other than the patient: yes Obtain history from someone other than the patient: yes Review and summarize past medical records: yes Discuss the patient with other providers: yes Independent visualization of images, tracings, or specimens: yes  Risk of Complications, Morbidity, and/or Mortality Presenting problems: high Diagnostic procedures: moderate Management options: moderate  Patient  Progress Patient progress: stable   Final Clinical Impression(s) / ED Diagnoses Final diagnoses:  Hypoglycemia  Weakness  Traumatic rhabdomyolysis, initial encounter [facility])    Rx / [place] Orders ED Discharge Orders    None       Blanchie Dessert, MD 04/22/20 2216

## 2020-04-22 NOTE — ED Notes (Signed)
CBG - 173 

## 2020-04-23 DIAGNOSIS — U071 COVID-19: Principal | ICD-10-CM | POA: Diagnosis present

## 2020-04-23 LAB — COMPREHENSIVE METABOLIC PANEL
ALT: 16 U/L (ref 0–44)
AST: 84 U/L — ABNORMAL HIGH (ref 15–41)
Albumin: 2.7 g/dL — ABNORMAL LOW (ref 3.5–5.0)
Alkaline Phosphatase: 32 U/L — ABNORMAL LOW (ref 38–126)
Anion gap: 9 (ref 5–15)
BUN: 7 mg/dL — ABNORMAL LOW (ref 8–23)
CO2: 28 mmol/L (ref 22–32)
Calcium: 8.3 mg/dL — ABNORMAL LOW (ref 8.9–10.3)
Chloride: 102 mmol/L (ref 98–111)
Creatinine, Ser: 0.78 mg/dL (ref 0.44–1.00)
GFR, Estimated: >60 mL/min (ref >60)
Glucose, Bld: 118 mg/dL — ABNORMAL HIGH (ref 70–99)
Potassium: 4.2 mmol/L (ref 3.5–5.1)
Sodium: 139 mmol/L (ref 135–145)
Total Bilirubin: 0.3 mg/dL (ref 0.3–1.2)
Total Protein: 5.9 g/dL — ABNORMAL LOW (ref 6.5–8.1)

## 2020-04-23 LAB — CBC
HCT: 36.5 % (ref 36.0–46.0)
Hemoglobin: 11.3 g/dL — ABNORMAL LOW (ref 12.0–15.0)
MCH: 30.5 pg (ref 26.0–34.0)
MCHC: 31 g/dL (ref 30.0–36.0)
MCV: 98.6 fL (ref 80.0–100.0)
Platelets: 216 10*3/uL (ref 150–400)
RBC: 3.7 MIL/uL — ABNORMAL LOW (ref 3.87–5.11)
RDW: 12.2 % (ref 11.5–15.5)
WBC: 6.1 10*3/uL (ref 4.0–10.5)
nRBC: 0 % (ref 0.0–0.2)

## 2020-04-23 LAB — CK: Total CK: 4835 U/L — ABNORMAL HIGH (ref 38–234)

## 2020-04-23 LAB — CBG MONITORING, ED
Glucose-Capillary: 106 mg/dL — ABNORMAL HIGH (ref 70–99)
Glucose-Capillary: 110 mg/dL — ABNORMAL HIGH (ref 70–99)
Glucose-Capillary: 113 mg/dL — ABNORMAL HIGH (ref 70–99)
Glucose-Capillary: 130 mg/dL — ABNORMAL HIGH (ref 70–99)
Glucose-Capillary: 136 mg/dL — ABNORMAL HIGH (ref 70–99)
Glucose-Capillary: 137 mg/dL — ABNORMAL HIGH (ref 70–99)
Glucose-Capillary: 78 mg/dL (ref 70–99)
Glucose-Capillary: 90 mg/dL (ref 70–99)
Glucose-Capillary: 97 mg/dL (ref 70–99)
Glucose-Capillary: 97 mg/dL (ref 70–99)

## 2020-04-23 LAB — RETICULOCYTES
Immature Retic Fract: 18.7 % — ABNORMAL HIGH (ref 2.3–15.9)
RBC.: 3.66 MIL/uL — ABNORMAL LOW (ref 3.87–5.11)
Retic Count, Absolute: 46.5 10*3/uL (ref 19.0–186.0)
Retic Ct Pct: 1.3 % (ref 0.4–3.1)

## 2020-04-23 LAB — D-DIMER, QUANTITATIVE: D-Dimer, Quant: 0.79 ug/mL-FEU — ABNORMAL HIGH (ref 0.00–0.50)

## 2020-04-23 LAB — HEMOGLOBIN A1C
Hgb A1c MFr Bld: 5.2 % (ref 4.8–5.6)
Mean Plasma Glucose: 102.54 mg/dL

## 2020-04-23 LAB — CREATININE, SERUM
Creatinine, Ser: 0.77 mg/dL (ref 0.44–1.00)
GFR, Estimated: >60 mL/min (ref >60)

## 2020-04-23 LAB — IRON AND TIBC
Iron: 45 ug/dL (ref 28–170)
Saturation Ratios: 18 % (ref 10.4–31.8)
TIBC: 253 ug/dL (ref 250–450)
UIBC: 208 ug/dL

## 2020-04-23 LAB — VITAMIN B12: Vitamin B-12: 124 pg/mL — ABNORMAL LOW (ref 180–914)

## 2020-04-23 LAB — C-REACTIVE PROTEIN: CRP: 0.8 mg/dL (ref <1.0)

## 2020-04-23 LAB — CORTISOL: Cortisol, Plasma: 20 ug/dL

## 2020-04-23 LAB — FOLATE: Folate: 8.2 ng/mL (ref >5.9)

## 2020-04-23 LAB — TROPONIN I (HIGH SENSITIVITY)
Troponin I (High Sensitivity): 41 ng/L — ABNORMAL HIGH (ref <18)
Troponin I (High Sensitivity): 47 ng/L — ABNORMAL HIGH (ref <18)

## 2020-04-23 LAB — FERRITIN: Ferritin: 377 ng/mL — ABNORMAL HIGH (ref 11–307)

## 2020-04-23 LAB — PROCALCITONIN: Procalcitonin: 0.62 ng/mL

## 2020-04-23 MED ORDER — METFORMIN HCL 500 MG PO TABS: 500.0000 mg | ORAL_TABLET | Freq: Every day | ORAL | 0 refills | Status: DC

## 2020-04-23 MED ORDER — SODIUM CHLORIDE 0.9 % IV SOLN: 100.0000 mg | Freq: Every day | INTRAVENOUS | Status: DC

## 2020-04-23 MED ORDER — ENOXAPARIN SODIUM 60 MG/0.6ML ~~LOC~~ SOLN: 50.0000 mg | SUBCUTANEOUS | Status: DC

## 2020-04-23 MED ORDER — SODIUM CHLORIDE 0.9 % IV SOLN
200.0000 mg | Freq: Once | INTRAVENOUS | Status: AC
  Administered 2020-04-23: 200 mg via INTRAVENOUS
  Filled 2020-04-23: qty 40

## 2020-04-23 NOTE — ED Notes (Signed)
SDU  Breakfast ordered  

## 2020-04-23 NOTE — Progress Notes (Signed)
Pt evaluated and presenting with soreness throughout her body. Was able to stand and take side steps at EOB with min A. Educated need for 24/7 assist. Pt reports she thinks she can stay with her daughter. Will need to ensure daughter can provide assist. If so, pt would be good candidate for HHPT and COVID at home program. If family unable to provide assist, likely will require SNF level therapies. Notified MD and case management. Formal note to follow.   Reuel Derby, PT, DPT  Acute Rehabilitation Services  Pager: [phone] Office: [phone]

## 2020-04-23 NOTE — ED Notes (Signed)
Portable commode handed to daughter.

## 2020-04-23 NOTE — Discharge Planning (Signed)
RNCM consulted regarding disposition needs that include DME 3n1 and referral to [facility].  RNCM placed order for 3n1 and contacted [facility] with referral.  Will update team as information becomes available.

## 2020-04-23 NOTE — Evaluation (Signed)
Physical Therapy Evaluation Patient Details Name: Charlene Houston MRN: [id number] DOB: 07-30-1945 Today's Date: 04/23/2020   History of Present Illness  Pt is a 75 y/o female admitted after being found down at home. Pt also with diarrhea and found to be COVID +. PMH includes breast cancer, HTN, DM, and asthma.  Clinical Impression  Pt admitted secondary to problem above with deficits below. Pt presenting with fatigue and generalized soreness from fall. Pt requiring mod A for bed mobility and min A to stand and take steps at EOB. Pt reports she normally uses rollator at home and lives with her son. Discussed need for 24/7 supervision at d/c and reports she thinks she can stay with her daughter at d/c. If pt able to stay with her daughter, feel she will benefit from HHPT/HHOT, and covid at home program. Discussed that if she does not have 24/7 support, will likely benefit from SNF level therapies. Will continue to follow acutely.     Follow Up Recommendations Home health PT;Supervision/Assistance - 24 hour (COVID at home program)    Equipment Recommendations  3in1 (PT)    Recommendations for Other Services       Precautions / Restrictions Precautions Precautions: Fall Restrictions Weight Bearing Restrictions: No      Mobility  Bed Mobility Overal bed mobility: Needs Assistance Bed Mobility: Supine to Sit;Sit to Supine     Supine to sit: Mod assist Sit to supine: Mod assist   General bed mobility comments: Required assist for LEs and trunk to perform bed mobility on stretcher. Increased time to perform secondary to soreness.    Transfers Overall transfer level: Needs assistance Equipment used: 1 person hand held assist Transfers: Sit to/from Stand Sit to Stand: Min assist         General transfer comment: Min A for steadying assist to stand from higher stretcher height. Pt reporting it felt better than she thought it would given soreness. PT stood in front of pt  and had pt hold to PT arms.  Ambulation/Gait Ambulation/Gait assistance: Min assist;Min guard   Assistive device: 1 person hand held assist       General Gait Details: Took steps at EOB as pt with limited line length on oxygen in ED. Min guard to min A for steadying. PT standing in front of pt and had pt hold to PT arms. Pt reporting increased fatigue.  Stairs            Wheelchair Mobility    Modified Rankin (Stroke Patients Only)       Balance Overall balance assessment: Needs assistance Sitting-balance support: No upper extremity supported Sitting balance-Leahy Scale: Good     Standing balance support: Bilateral upper extremity supported;During functional activity Standing balance-Leahy Scale: Poor Standing balance comment: Reliant on BUE support                             Pertinent Vitals/Pain Pain Assessment: Faces Faces Pain Scale: Hurts even more Pain Location: bottom and all over Pain Descriptors / Indicators: Aching Pain Intervention(s): Limited activity within patient's tolerance;Monitored during session;Repositioned    Home Living Family/patient expects to be discharged to:: Private residence Living Arrangements: Children Available Help at Discharge: Family;Available PRN/intermittently Type of Home: House Home Access: Stairs to enter Entrance Stairs-Rails: None Entrance Stairs-Number of Steps: 1 Home Layout: Two level Home Equipment: Walker - 4 wheels Additional Comments: Reports she plans to go stay with her daughter at d/c.  Prior Function Level of Independence: Independent with assistive device(s)         Comments: USes rollator for ambulation     Hand Dominance        Extremity/Trunk Assessment   Upper Extremity Assessment Upper Extremity Assessment: Generalized weakness    Lower Extremity Assessment Lower Extremity Assessment: Generalized weakness    Cervical / Trunk Assessment Cervical / Trunk Assessment:  Normal  Communication   Communication: No difficulties  Cognition Arousal/Alertness: Awake/alert Behavior During Therapy: WFL for tasks assessed/performed Overall Cognitive Status: Within Functional Limits for tasks assessed                                        General Comments General comments (skin integrity, edema, etc.): Discussed need for 24/7 supervision at home, as pt reporting she would prefer to go home instead of SNF. Pt reports she thinks she can go stay with her daughter at d/c.    Exercises     Assessment/Plan    PT Assessment Patient needs continued PT services  PT Problem List Decreased strength;Decreased balance;Decreased mobility;Pain;Decreased activity tolerance       PT Treatment Interventions DME instruction;Gait training;Functional mobility training;Stair training;Therapeutic activities;Therapeutic exercise;Balance training;Patient/family education    PT Goals (Current goals can be found in the Care Plan section)  Acute Rehab PT Goals Patient Stated Goal: to go home PT Goal Formulation: With patient Time For Goal Achievement: 05/07/20 Potential to Achieve Goals: Good    Frequency Min 3X/week   Barriers to discharge        Co-evaluation               AM-PAC PT "6 Clicks" Mobility  Outcome Measure Help needed turning from your back to your side while in a flat bed without using bedrails?: A Little Help needed moving from lying on your back to sitting on the side of a flat bed without using bedrails?: A Lot Help needed moving to and from a bed to a chair (including a wheelchair)?: A Little Help needed standing up from a chair using your arms (e.g., wheelchair or bedside chair)?: A Little Help needed to walk in hospital room?: A Little Help needed climbing 3-5 steps with a railing? : A Lot 6 Click Score: 16    End of Session Equipment Utilized During Treatment: Gait belt Activity Tolerance: Patient limited by fatigue Patient  left: in bed;with call bell/phone within reach (on stretcher in ED) Nurse Communication: Mobility status PT Visit Diagnosis: Unsteadiness on feet (R26.81);Muscle weakness (generalized) (M62.81);History of falling (Z91.81)    Time: 9628-3662 PT Time Calculation (min) (ACUTE ONLY): 25 min   Charges:   PT Evaluation $PT Eval Moderate Complexity: 1 Mod          Reuel Derby, PT, DPT  Acute Rehabilitation Services  Pager: [phone] Office: [phone]   Rudean Hitt 04/23/2020, 1:47 PM

## 2020-04-23 NOTE — ED Notes (Signed)
Davy Pique, daughter was updated about pt's discharge instructions. All questions answered.

## 2020-04-23 NOTE — ED Notes (Signed)
Gave pt 8oz apple juice

## 2020-04-23 NOTE — ED Notes (Signed)
Pt placed on 3L [place] to increase O2 saturation.

## 2020-04-23 NOTE — Discharge Summary (Signed)
Physician Discharge Summary  Charlene Houston ZHY:[id number] DOB: June 23, 1945 DOA: 04/22/2020  PCP: Lilian Coma., MD  Admit date: 04/22/2020 Discharge date: 04/23/2020  Admitted From: Home Disposition: Home  Recommendations for Outpatient Follow-up:  1. Follow up with PCP in 1-2 weeks 2. Please obtain BMP/CBC in one week 3. Please follow up on the following pending results:  Home Health: None Equipment/Devices: None  Discharge Condition: Stable CODE STATUS: Full Diet recommendation: Diabetic diet  Brief/Interim Summary: Charlene Houston a 75 y.o.femalewithknown history of diabetes mellitus type 2, hypertension, hyperlipidemia, asthma and chronic LBBB has been having some diarrhea last 2 days. Patient states 2 days ago when she had the first episode of diarrhea she was trying to speeding up to the bathroom but she had an accident. Later she was trying to clean the floor and in doing that patient slipped and fell onto the floor and was unable to get up because of the weakness. Patient states she did not hit her head or lose consciousness. She remained on the floor for almost 24 hours and later somehow managed to get up and reach to the phone and called her daughter. Patient's daughter came and checked on her and helped her to the bed. The following day patient's daughter found that patient was confused and EMS was called and patient was found to be hypoglycemic. In EX:BMWUX test eventually came positive. Patient was not hypoxic. Patient remained hypoglycemic was given D50 twice and has to be started on D5W. Patient states she has not been eating well for last 2 days.  Patient admitted as above with acute mental status changes in the setting of hypoglycemia found to be COVID-19 positive.  She had prolonged downtime after mechanical fall in the setting of hypoglycemia, poor memory, poor p.o. intake and COVID-19 infection.  Of note patient's A1c was remarkably low at  5.1, recommend to stop glimepiride and decrease metformin to once daily 500 mg.  Patient's LKGMW-10 COVID-19, presumed to be acute, was asymptomatic without hypoxia or other respiratory symptoms, questionable poor p.o. or p.o. intake and dehydration could be secondary to COVID-19 infection but patient indicates her GI symptoms have resolved at this point.  Given patient is not tolerating p.o. quite well, glucose is well controlled and otherwise has remarkably improved in strength and strength she was evaluated by PT who recommended home health and PT with family care at home which daughter agreed was reasonable. Patient otherwise stable and agreeable for discharge.  Discharge Diagnoses:  Principal Problem:   COVID-19 virus infection Active Problems:   Hypoglycemia   Rhabdomyolysis   Essential hypertension   HLD (hyperlipidemia)   Sleep apnea   Diabetes mellitus type 2 in nonobese [facility])    Discharge Instructions  Discharge Instructions    Call MD for:  difficulty breathing, headache or visual disturbances   Complete by: As directed    Call MD for:  extreme fatigue   Complete by: As directed    Call MD for:  persistant dizziness or light-headedness   Complete by: As directed    Diet Carb Modified   Complete by: As directed    Increase activity slowly   Complete by: As directed      Allergies as of 04/23/2020      Reactions   Prozac [fluoxetine Hcl] Other (See Comments)   Gittery   Vit D-vit E-safflower Oil Other (See Comments)   Not specified      Medication List    STOP taking these medications  glimepiride 4 MG tablet Commonly known as: AMARYL   pravastatin 40 MG tablet Commonly known as: PRAVACHOL     TAKE these medications   acetaminophen 325 MG tablet Commonly known as: TYLENOL Take 650 mg by mouth every 6 (six) hours as needed for mild pain or headache.   albuterol 108 (90 Base) MCG/ACT inhaler Commonly known as: VENTOLIN HFA Inhale 1-2 puffs into the lungs  every 6 (six) hours as needed for wheezing or shortness of breath.   fluticasone 50 MCG/ACT nasal spray Commonly known as: FLONASE Place 1 spray into both nostrils as needed for allergies or rhinitis.   furosemide 20 MG tablet Commonly known as: LASIX Take 1 tablet (20 mg total) by mouth daily.   gabapentin 300 MG capsule Commonly known as: NEURONTIN Take 300 mg by mouth 2 (two) times daily. Taking twice daily   hydrochlorothiazide 12.5 MG capsule Commonly known as: MICROZIDE Take 12.5 mg by mouth daily.   HYDROcodone-acetaminophen 5-325 MG tablet Commonly known as: NORCO/VICODIN Take 1-2 tablets by mouth every 6 (six) hours as needed.   ipratropium-albuterol 0.5-2.5 (3) MG/3ML Soln Commonly known as: DUONEB Take 3 mLs by nebulization 2 (two) times daily.   lisinopril 20 MG tablet Commonly known as: ZESTRIL Take 20 mg by mouth daily.   metFORMIN 500 MG tablet Commonly known as: GLUCOPHAGE Take 1 tablet (500 mg total) by mouth daily with breakfast. What changed: when to take this   mirtazapine 15 MG tablet Commonly known as: REMERON Take 1 tablet (15 mg total) by mouth at bedtime.   mometasone-formoterol 200-5 MCG/ACT Aero Commonly known as: DULERA Inhale 2 puffs into the lungs 2 (two) times daily.   pantoprazole 40 MG tablet Commonly known as: PROTONIX Take 1 tablet (40 mg total) by mouth at bedtime.   potassium chloride SA 20 MEQ tablet Commonly known as: KLOR-CON Take 20 mEq by mouth daily.   predniSONE 10 MG tablet Commonly known as: DELTASONE Take 1 tablet (10 mg total) by mouth daily with breakfast.   simvastatin 40 MG tablet Commonly known as: ZOCOR Take 40 mg by mouth at bedtime.   triamcinolone cream 0.5 % Commonly known as: KENALOG Apply 1 application topically 3 (three) times daily.   Vitamin D (Ergocalciferol) 1.25 MG (50000 UNIT) Caps capsule Commonly known as: DRISDOL Take 50,000 Units by mouth every Sunday.            Durable  Medical Equipment  (From admission, onward)         Start     Ordered   04/23/20 1128  For home use only DME 3 n 1  Once        01 /12/22 1128          Allergies  Allergen Reactions  . Prozac [Fluoxetine Hcl] Other (See Comments)    Gittery  . Vit D-Vit E-Safflower Oil Other (See Comments)    Not specified    Consultations:  None   Procedures/Studies: DG Chest 2 View  Result Date: 04/22/2020 CLINICAL DATA:  Cough, upper back pain EXAM: CHEST - 2 VIEW COMPARISON:  07/10/2018 FINDINGS: Cardiomegaly. Small nodular density projects over the right upper lobe which appears more prominent than prior study. No confluent opacities otherwise. No effusions or edema. No acute bony abnormality. IMPRESSION: Cardiomegaly. Nodular density projects over the right upper lobe. This could be further evaluated with chest CT. Electronically Signed   By: Rolm Baptise M.D.   On: 04/22/2020 21:30   DG Thoracic Spine 2 View  Result Date: 04/22/2020 CLINICAL DATA:  Cough and upper back pain EXAM: THORACIC SPINE 2 VIEWS COMPARISON:  None. FINDINGS: There is no evidence of thoracic spine fracture. Alignment is normal. No other significant bone abnormalities are identified. IMPRESSION: Negative. Electronically Signed   By: Ulyses Jarred M.D.   On: 04/22/2020 21:30      Subjective: No acute issues/events overnight   Discharge Exam: Vitals:   04/23/20 0400 04/23/20 0930  BP: 116/69 117/71  Pulse: 90 89  Resp: 15 19  Temp:    SpO2: 98% 99%   Vitals:   04/23/20 0230 04/23/20 0300 04/23/20 0400 04/23/20 0930  BP: 114/68 117/66 116/69 117/71  Pulse: 88 80 90 89  Resp: 16 (!) 2 15 19   Temp:      TempSrc:      SpO2: 100% 100% 98% 99%  Weight:    109 kg    General: Pt is alert, awake, not in acute distress Cardiovascular: RRR, S1/S2 +, no rubs, no gallops Respiratory: CTA bilaterally, no wheezing, no rhonchi Abdominal: Soft, NT, ND, bowel sounds + Extremities: no edema, no  cyanosis    The results of significant diagnostics from this hospitalization (including imaging, microbiology, ancillary and laboratory) are listed below for reference.     Microbiology: Recent Results (from the past 240 hour(s))  SARS CORONAVIRUS 2 (TAT 6-24 HRS) Nasopharyngeal Nasopharyngeal Swab     Status: Abnormal   Collection Time: 04/22/20  6:18 PM   Specimen: Nasopharyngeal Swab  Result Value Ref Range Status   SARS Coronavirus 2 POSITIVE (A) NEGATIVE Final    Comment: (NOTE) SARS-CoV-2 target nucleic acids are DETECTED.  The SARS-CoV-2 RNA is generally detectable in upper and lower respiratory specimens during the acute phase of infection. Positive results are indicative of the presence of SARS-CoV-2 RNA. Clinical correlation with patient history and other diagnostic information is  necessary to determine patient infection status. Positive results do not rule out bacterial infection or co-infection with other viruses.  The expected result is Negative.  Fact Sheet for Patients: SugarRoll.be  Fact Sheet for Healthcare Providers: https://www.woods-mathews.com/  This test is not yet approved or cleared by the [place] FDA and  has been authorized for detection and/or diagnosis of SARS-CoV-2 by FDA under an Emergency Use Authorization (EUA). This EUA will remain  in effect (meaning this test can be used) for the duration of the COVID-19 declaration under Section 564(b)(1) of the Act, 21 U. S.C. section 360bbb-3(b)(1), unless the authorization is terminated or revoked sooner.   Performed at [facility], [place] [address], [place], [place] [address]      Labs: BNP (last 3 results) No results for input(s): BNP in the last 8760 hours. Basic Metabolic Panel: Recent Labs  Lab 04/22/20 1800 04/22/20 2339 04/23/20 0101  NA 136  --  139  K 5.0  --  4.2  CL 102  --  102  CO2 22  --  28  GLUCOSE 202*  --  118*  BUN  11  --  7*  CREATININE 0.86 0.77 0.78  CALCIUM 8.3*  --  8.3*   Liver Function Tests: Recent Labs  Lab 04/22/20 1800 04/23/20 0101  AST 94* 84*  ALT 17 16  ALKPHOS 31* 32*  BILITOT 1.1 0.3  PROT 5.7* 5.9*  ALBUMIN 2.8* 2.7*   Recent Labs  Lab 04/22/20 1800  LIPASE 42   No results for input(s): AMMONIA in the last 168 hours. CBC: Recent Labs  Lab 04/22/20  1800 04/22/20 2339  WBC 5.7 6.1  NEUTROABS 4.0  --   HGB 11.5* 11.3*  HCT 36.1 36.5  MCV 100.0 98.6  PLT 219 216   Cardiac Enzymes: Recent Labs  Lab 04/22/20 1800 04/23/20 0555  CKTOTAL 6,301* 4,835*   BNP: Invalid input(s): POCBNP CBG: Recent Labs  Lab 04/23/20 0425 04/23/20 0459 04/23/20 0602 04/23/20 0921 04/23/20 1146  GLUCAP 78 106* 110* 97 136*   D-Dimer Recent Labs    04/23/20 0555  DDIMER 0.79*   Hgb A1c Recent Labs    04/22/20 2341  HGBA1C 5.2   Lipid Profile No results for input(s): CHOL, HDL, LDLCALC, TRIG, CHOLHDL, LDLDIRECT in the last 72 hours. Thyroid function studies No results for input(s): TSH, T4TOTAL, T3FREE, THYROIDAB in the last 72 hours.  Invalid input(s): FREET3 Anemia work up Recent Labs    04/23/20 0555  VITAMINB12 124*  FOLATE 8.2  FERRITIN 377*  TIBC 253  IRON 45  RETICCTPCT 1.3   Urinalysis    Component Value Date/Time   COLORURINE YELLOW 04/22/2020 1955   APPEARANCEUR HAZY (A) 04/22/2020 1955   LABSPEC 1.006 04/22/2020 1955   PHURINE 5.0 04/22/2020 1955   GLUCOSEU NEGATIVE 04/22/2020 1955   HGBUR NEGATIVE 04/22/2020 1955   BILIRUBINUR NEGATIVE 04/22/2020 1955   KETONESUR NEGATIVE 04/22/2020 1955   PROTEINUR NEGATIVE 04/22/2020 1955   NITRITE NEGATIVE 04/22/2020 1955   LEUKOCYTESUR SMALL (A) 04/22/2020 1955   Sepsis Labs Invalid input(s): PROCALCITONIN,  WBC,  LACTICIDVEN Microbiology Recent Results (from the past 240 hour(s))  SARS CORONAVIRUS 2 (TAT 6-24 HRS) Nasopharyngeal Nasopharyngeal Swab     Status: Abnormal   Collection Time:  04/22/20  6:18 PM   Specimen: Nasopharyngeal Swab  Result Value Ref Range Status   SARS Coronavirus 2 POSITIVE (A) NEGATIVE Final    Comment: (NOTE) SARS-CoV-2 target nucleic acids are DETECTED.  The SARS-CoV-2 RNA is generally detectable in upper and lower respiratory specimens during the acute phase of infection. Positive results are indicative of the presence of SARS-CoV-2 RNA. Clinical correlation with patient history and other diagnostic information is  necessary to determine patient infection status. Positive results do not rule out bacterial infection or co-infection with other viruses.  The expected result is Negative.  Fact Sheet for Patients: SugarRoll.be  Fact Sheet for Healthcare Providers: https://www.woods-mathews.com/  This test is not yet approved or cleared by the [place] FDA and  has been authorized for detection and/or diagnosis of SARS-CoV-2 by FDA under an Emergency Use Authorization (EUA). This EUA will remain  in effect (meaning this test can be used) for the duration of the COVID-19 declaration under Section 564(b)(1) of the Act, 21 U. S.C. section 360bbb-3(b)(1), unless the authorization is terminated or revoked sooner.   Performed at [facility], [place] [address], [place], [place] [address]      Time coordinating discharge: Over 30 minutes  SIGNED:   Little Ishikawa, DO [facility] 04/23/2020, 1:13 PM Pager   If 7PM-7AM, please contact night-coverage [email or web address]

## 2020-04-24 LAB — C-PEPTIDE: C-Peptide: 6.8 ng/mL — ABNORMAL HIGH (ref 1.1–4.4)

## 2020-09-03 ENCOUNTER — Encounter (HOSPITAL_COMMUNITY): Payer: Self-pay | Admitting: *Deleted

## 2020-09-03 ENCOUNTER — Other Ambulatory Visit: Payer: Self-pay

## 2020-09-03 ENCOUNTER — Ambulatory Visit (HOSPITAL_COMMUNITY)
Admission: EM | Admit: 2020-09-03 | Discharge: 2020-09-03 | Disposition: A | Discharge status: Home / Self Care | Payer: Medicare HMO | Attending: Physician Assistant | Admitting: Physician Assistant

## 2020-09-03 DIAGNOSIS — L299 Pruritus, unspecified: Secondary | ICD-10-CM | POA: Diagnosis not present

## 2020-09-03 MED ORDER — DESONIDE 0.05 % EX CREA: TOPICAL_CREAM | Freq: Two times a day (BID) | CUTANEOUS | 1 refills | Status: DC

## 2020-09-03 NOTE — ED Provider Notes (Signed)
[facility]    CSN: [id number] Arrival date & time: 09/03/20  8088      History   Chief Complaint Chief Complaint  Patient presents with  . hands itching    HPI Charlene Houston is a 75 y.o. female.   Patient presents today with a 3-day history of pruritus of bilateral palms.  She denies any changes to personal hygiene products including soaps or detergents.  Denies any medication changes.  Denies any household contacts with similar symptoms.  Denies any recent travel.  Denies exposure to poison ivy or other plants.  She does not have any pets.  Denies any insect exposure.  She has not tried any over-the-counter medications for symptom management.  She was recently diagnosed with lung cancer but has not yet started treatment as she is still waiting on insurance approval.  Reports she had significant blood work done within the week including liver function and CBC that was essentially normal.  She denies additional symptoms including fever, shortness of breath, chest pain, nausea, vomiting.     Past Medical History:  Diagnosis Date  . Arthritis   . Asthma   . Breast cancer ([place])   . Diabetes mellitus without complication ([place])   . Hypertension   . SBO (small bowel obstruction) ([place]) 06/2018    Patient Active Problem List   Diagnosis Date Noted  . COVID-19 virus infection 04/23/2020  . Hypoglycemia 04/22/2020  . Rhabdomyolysis 04/22/2020  . Essential hypertension 04/22/2020  . HLD (hyperlipidemia) 04/22/2020  . Sleep apnea 04/22/2020  . Diabetes mellitus type 2 in nonobese ([place]) 04/22/2020  . SBO (small bowel obstruction) ([place]) 07/06/2018    Past Surgical History:  Procedure Laterality Date  . ABDOMINAL HYSTERECTOMY    . THUMB AMPUTATION Left    PARTIAL  . TONSILLECTOMY      OB History   No obstetric history on file.      Home Medications    Prior to Admission medications   Medication Sig Start Date End Date Taking? Authorizing Provider   desonide (DESOWEN) 0.05 % cream Apply topically 2 (two) times daily. 09/03/20  Yes Basheer Molchan, Derry Skill, PA-C  acetaminophen (TYLENOL) 325 MG tablet Take 650 mg by mouth every 6 (six) hours as needed for mild pain or headache.    [provider]  albuterol (PROVENTIL HFA;VENTOLIN HFA) 108 (90 Base) MCG/ACT inhaler Inhale 1-2 puffs into the lungs every 6 (six) hours as needed for wheezing or shortness of breath.    [provider]  fluticasone (FLONASE) 50 MCG/ACT nasal spray Place 1 spray into both nostrils as needed for allergies or rhinitis.     [provider]  furosemide (LASIX) 20 MG tablet Take 1 tablet (20 mg total) by mouth daily. 07/12/18   Regalado, Belkys A, MD  gabapentin (NEURONTIN) 300 MG capsule Take 300 mg by mouth 2 (two) times daily. Taking twice daily 10/26/17   [provider]  hydrochlorothiazide (MICROZIDE) 12.5 MG capsule Take 12.5 mg by mouth daily. 04/21/20   [provider]  HYDROcodone-acetaminophen (NORCO/VICODIN) 5-325 MG tablet Take 1-2 tablets by mouth every 6 (six) hours as needed. 02/17/20   Davonna Belling, MD  ipratropium-albuterol (DUONEB) 0.5-2.5 (3) MG/3ML SOLN Take 3 mLs by nebulization 2 (two) times daily. 07/11/18   Regalado, Belkys A, MD  lisinopril (PRINIVIL,ZESTRIL) 20 MG tablet Take 20 mg by mouth daily. 04/11/18   [provider]  metFORMIN (GLUCOPHAGE) 500 MG tablet Take 1 tablet (500 mg total) by mouth  daily with breakfast. 04/23/20   Little Ishikawa, MD  mirtazapine (REMERON) 15 MG tablet Take 1 tablet (15 mg total) by mouth at bedtime. 04/23/19   Robyn Haber, MD  mometasone-formoterol (DULERA) 200-5 MCG/ACT AERO Inhale 2 puffs into the lungs 2 (two) times daily. 07/11/18   Regalado, Belkys A, MD  pantoprazole (PROTONIX) 40 MG tablet Take 1 tablet (40 mg total) by mouth at bedtime. 07/11/18   Regalado, Belkys A, MD  potassium chloride SA (K-DUR,KLOR-CON) 20 MEQ tablet Take 20 mEq by mouth daily. 10/26/17    [provider]  predniSONE (DELTASONE) 10 MG tablet Take 1 tablet (10 mg total) by mouth daily with breakfast. 04/23/19   Robyn Haber, MD  simvastatin (ZOCOR) 40 MG tablet Take 40 mg by mouth at bedtime. 03/10/20   [provider]  triamcinolone cream (KENALOG) 0.5 % Apply 1 application topically 3 (three) times daily. 10/06/17   [provider]  Vitamin D, Ergocalciferol, (DRISDOL) 1.25 MG (50000 UNIT) CAPS capsule Take 50,000 Units by mouth every Sunday. 04/17/20   [provider]  escitalopram (LEXAPRO) 5 MG tablet Take 5 mg by mouth daily. 06/15/18 04/23/19  [provider]    Family History Family History  Problem Relation Age of Onset  . Hypertension Mother   . Diabetes Mother   . Cancer Father     Social History Social History   Tobacco Use  . Smoking status: Former Smoker    Types: Cigarettes    Quit date: 1995    Years since quitting: 27.4  . Smokeless tobacco: Never Used  Vaping Use  . Vaping Use: Never used  Substance Use Topics  . Alcohol use: Not Currently  . Drug use: Not Currently     Allergies   Prozac [fluoxetine hcl] and Vit d-vit e-safflower oil   Review of Systems Review of Systems  Constitutional: Negative for activity change, appetite change, fatigue and fever.  Respiratory: Negative for cough and shortness of breath.   Cardiovascular: Negative for chest pain.  Gastrointestinal: Negative for abdominal pain, diarrhea, nausea and vomiting.  Skin: Negative for rash.  Neurological: Negative for dizziness, light-headedness, numbness and headaches.     Physical Exam Triage Vital Signs ED Triage Vitals  Enc Vitals Group     BP 09/03/20 1015 130/62     Pulse Rate 09/03/20 1015 (!) 102     Resp 09/03/20 1015 20     Temp 09/03/20 1015 98.3 F (36.8 C)     Temp src --      SpO2 09/03/20 1015 98 %     Weight --      Height --      Head Circumference --      Peak Flow --      Pain Score 09/03/20 1013  0     Pain Loc --      Pain Edu? --      Excl. in [place]? --    No data found.  Updated Vital Signs BP 130/62   Pulse (!) 102   Temp 98.3 F (36.8 C)   Resp 20   SpO2 98%   Visual Acuity Right Eye Distance:   Left Eye Distance:   Bilateral Distance:    Right Eye Near:   Left Eye Near:    Bilateral Near:     Physical Exam Vitals reviewed.  Constitutional:      General: She is awake. She is not in acute distress.    Appearance: Normal appearance.  She is not ill-appearing.     Comments: Very pleasant female appears stated age in no acute distress  HENT:     Head: Normocephalic and atraumatic.  Cardiovascular:     Rate and Rhythm: Normal rate and regular rhythm.     Pulses:          Radial pulses are 2+ on the right side and 2+ on the left side.     Heart sounds: No murmur heard.   Pulmonary:     Effort: Pulmonary effort is normal.     Breath sounds: Normal breath sounds. No wheezing, rhonchi or rales.     Comments: Clear to auscultation bilaterally Abdominal:     Palpations: Abdomen is soft.     Tenderness: There is no abdominal tenderness.  Skin:    Findings: No lesion or rash.     Comments: No rash or skin lesions noted.  Psychiatric:        Behavior: Behavior is cooperative.      UC Treatments / Results  Labs (all labs ordered are listed, but only abnormal results are displayed) Labs Reviewed - No data to display  EKG   Radiology No results found.  Procedures Procedures (including critical care time)  Medications Ordered in UC Medications - No data to display  Initial Impression / Assessment and Plan / UC Course  I have reviewed the triage vital signs and the nursing notes.  Pertinent labs & imaging results that were available during my care of the patient were reviewed by me and considered in my medical decision making (see chart for details).     Discussed potential utility of updating lab work given patient does not rash on exam but she  declined this as she had recent lab work that was normal.  We will prescribed desonide to be applied twice daily to help manage symptoms.  Will alternate antihistamines to help with pruritus.  Recommended she use hypoallergenic soaps and detergents.  Discussed that if she has any spread of symptoms or additional symptoms she is to be reevaluated immediately.  Strict return precautions given to which patient expressed understanding.  Final Clinical Impressions(s) / UC Diagnoses   Final diagnoses:  Itching of both hands     Discharge Instructions     Apply cream to affected area twice daily.  Alternate antihistamines; specifically famotidine and cetirizine to help with itching.  Use hypoallergenic soaps and detergents.  If develop any additional symptoms or rash please return for reevaluation.    ED Prescriptions    Medication Sig Dispense Auth. Provider   desonide (DESOWEN) 0.05 % cream Apply topically 2 (two) times daily. 30 g Letita Prentiss, Derry Skill, PA-C     PDMP not reviewed this encounter.   Terrilee Croak, PA-C 09/03/20 1055

## 2020-09-03 NOTE — ED Triage Notes (Signed)
Pt reports bilateral hands are itching  For 3 days

## 2020-09-03 NOTE — Discharge Instructions (Signed)
Apply cream to affected area twice daily.  Alternate antihistamines; specifically famotidine and cetirizine to help with itching.  Use hypoallergenic soaps and detergents.  If develop any additional symptoms or rash please return for reevaluation.

## 2021-06-09 ENCOUNTER — Emergency Department (HOSPITAL_COMMUNITY): Payer: Medicare HMO

## 2021-06-09 ENCOUNTER — Other Ambulatory Visit: Payer: Self-pay

## 2021-06-09 ENCOUNTER — Observation Stay (HOSPITAL_COMMUNITY)
Admission: EM | Admit: 2021-06-09 | Discharge: 2021-06-10 | Disposition: A | Discharge status: Home / Self Care | Payer: Medicare HMO | Attending: Internal Medicine | Admitting: Internal Medicine

## 2021-06-09 DIAGNOSIS — R600 Localized edema: Secondary | ICD-10-CM | POA: Insufficient documentation

## 2021-06-09 DIAGNOSIS — I10 Essential (primary) hypertension: Secondary | ICD-10-CM | POA: Diagnosis present

## 2021-06-09 DIAGNOSIS — K219 Gastro-esophageal reflux disease without esophagitis: Secondary | ICD-10-CM

## 2021-06-09 DIAGNOSIS — Z7984 Long term (current) use of oral hypoglycemic drugs: Secondary | ICD-10-CM | POA: Insufficient documentation

## 2021-06-09 DIAGNOSIS — J9601 Acute respiratory failure with hypoxia: Secondary | ICD-10-CM | POA: Diagnosis not present

## 2021-06-09 DIAGNOSIS — E785 Hyperlipidemia, unspecified: Secondary | ICD-10-CM | POA: Diagnosis present

## 2021-06-09 DIAGNOSIS — I700 Atherosclerosis of aorta: Secondary | ICD-10-CM | POA: Insufficient documentation

## 2021-06-09 DIAGNOSIS — K760 Fatty (change of) liver, not elsewhere classified: Secondary | ICD-10-CM | POA: Diagnosis not present

## 2021-06-09 DIAGNOSIS — C3490 Malignant neoplasm of unspecified part of unspecified bronchus or lung: Secondary | ICD-10-CM | POA: Diagnosis present

## 2021-06-09 DIAGNOSIS — I119 Hypertensive heart disease without heart failure: Secondary | ICD-10-CM | POA: Insufficient documentation

## 2021-06-09 DIAGNOSIS — E162 Hypoglycemia, unspecified: Secondary | ICD-10-CM | POA: Diagnosis present

## 2021-06-09 DIAGNOSIS — R918 Other nonspecific abnormal finding of lung field: Secondary | ICD-10-CM

## 2021-06-09 DIAGNOSIS — E11649 Type 2 diabetes mellitus with hypoglycemia without coma: Secondary | ICD-10-CM | POA: Diagnosis not present

## 2021-06-09 DIAGNOSIS — Z87891 Personal history of nicotine dependence: Secondary | ICD-10-CM | POA: Insufficient documentation

## 2021-06-09 DIAGNOSIS — Z79899 Other long term (current) drug therapy: Secondary | ICD-10-CM | POA: Insufficient documentation

## 2021-06-09 DIAGNOSIS — G4730 Sleep apnea, unspecified: Secondary | ICD-10-CM | POA: Insufficient documentation

## 2021-06-09 DIAGNOSIS — Z853 Personal history of malignant neoplasm of breast: Secondary | ICD-10-CM | POA: Diagnosis not present

## 2021-06-09 DIAGNOSIS — I6782 Cerebral ischemia: Secondary | ICD-10-CM | POA: Diagnosis not present

## 2021-06-09 DIAGNOSIS — N200 Calculus of kidney: Secondary | ICD-10-CM | POA: Insufficient documentation

## 2021-06-09 DIAGNOSIS — R0902 Hypoxemia: Secondary | ICD-10-CM

## 2021-06-09 DIAGNOSIS — E876 Hypokalemia: Secondary | ICD-10-CM | POA: Diagnosis not present

## 2021-06-09 DIAGNOSIS — I447 Left bundle-branch block, unspecified: Secondary | ICD-10-CM | POA: Insufficient documentation

## 2021-06-09 DIAGNOSIS — C7800 Secondary malignant neoplasm of unspecified lung: Secondary | ICD-10-CM | POA: Insufficient documentation

## 2021-06-09 DIAGNOSIS — J9692 Respiratory failure, unspecified with hypercapnia: Secondary | ICD-10-CM

## 2021-06-09 DIAGNOSIS — R270 Ataxia, unspecified: Secondary | ICD-10-CM

## 2021-06-09 DIAGNOSIS — R42 Dizziness and giddiness: Secondary | ICD-10-CM | POA: Diagnosis present

## 2021-06-09 DIAGNOSIS — Z20822 Contact with and (suspected) exposure to covid-19: Secondary | ICD-10-CM | POA: Insufficient documentation

## 2021-06-09 DIAGNOSIS — J45909 Unspecified asthma, uncomplicated: Secondary | ICD-10-CM | POA: Diagnosis not present

## 2021-06-09 LAB — DIFFERENTIAL
Abs Immature Granulocytes: 0.03 10*3/uL (ref 0.00–0.07)
Basophils Absolute: 0.1 10*3/uL (ref 0.0–0.1)
Basophils Relative: 1 %
Eosinophils Absolute: 0.1 10*3/uL (ref 0.0–0.5)
Eosinophils Relative: 1 %
Immature Granulocytes: 0 %
Lymphocytes Relative: 33 %
Lymphs Abs: 3.3 10*3/uL (ref 0.7–4.0)
Monocytes Absolute: 0.9 10*3/uL (ref 0.1–1.0)
Monocytes Relative: 9 %
Neutro Abs: 5.6 10*3/uL (ref 1.7–7.7)
Neutrophils Relative %: 56 %

## 2021-06-09 LAB — COMPREHENSIVE METABOLIC PANEL
ALT: 10 U/L (ref 0–44)
AST: 20 U/L (ref 15–41)
Albumin: 2.6 g/dL — ABNORMAL LOW (ref 3.5–5.0)
Alkaline Phosphatase: 40 U/L (ref 38–126)
Anion gap: 9 (ref 5–15)
BUN: 5 mg/dL — ABNORMAL LOW (ref 8–23)
CO2: 28 mmol/L (ref 22–32)
Calcium: 8.2 mg/dL — ABNORMAL LOW (ref 8.9–10.3)
Chloride: 101 mmol/L (ref 98–111)
Creatinine, Ser: 1.01 mg/dL — ABNORMAL HIGH (ref 0.44–1.00)
GFR, Estimated: 58 mL/min — ABNORMAL LOW (ref >60)
Glucose, Bld: 70 mg/dL (ref 70–99)
Potassium: 3.2 mmol/L — ABNORMAL LOW (ref 3.5–5.1)
Sodium: 138 mmol/L (ref 135–145)
Total Bilirubin: 0.5 mg/dL (ref 0.3–1.2)
Total Protein: 6 g/dL — ABNORMAL LOW (ref 6.5–8.1)

## 2021-06-09 LAB — CBC
HCT: 36.4 % (ref 36.0–46.0)
Hemoglobin: 11.3 g/dL — ABNORMAL LOW (ref 12.0–15.0)
MCH: 30.5 pg (ref 26.0–34.0)
MCHC: 31 g/dL (ref 30.0–36.0)
MCV: 98.4 fL (ref 80.0–100.0)
Platelets: 287 10*3/uL (ref 150–400)
RBC: 3.7 MIL/uL — ABNORMAL LOW (ref 3.87–5.11)
RDW: 13.1 % (ref 11.5–15.5)
WBC: 10 10*3/uL (ref 4.0–10.5)
nRBC: 0 % (ref 0.0–0.2)

## 2021-06-09 LAB — URINALYSIS, ROUTINE W REFLEX MICROSCOPIC
Bilirubin Urine: NEGATIVE
Glucose, UA: NEGATIVE mg/dL
Hgb urine dipstick: NEGATIVE
Ketones, ur: NEGATIVE mg/dL
Leukocytes,Ua: NEGATIVE
Nitrite: NEGATIVE
Protein, ur: NEGATIVE mg/dL
Specific Gravity, Urine: 1.004 — ABNORMAL LOW (ref 1.005–1.030)
pH: 5 (ref 5.0–8.0)

## 2021-06-09 LAB — APTT: aPTT: 30 seconds (ref 24–36)

## 2021-06-09 LAB — PROTIME-INR
INR: 1 (ref 0.8–1.2)
Prothrombin Time: 13.6 seconds (ref 11.4–15.2)

## 2021-06-09 LAB — MAGNESIUM: Magnesium: 1.3 mg/dL — ABNORMAL LOW (ref 1.7–2.4)

## 2021-06-09 LAB — CBG MONITORING, ED: Glucose-Capillary: 70 mg/dL (ref 70–99)

## 2021-06-09 MED ORDER — POTASSIUM CHLORIDE CRYS ER 20 MEQ PO TBCR
40.0000 meq | EXTENDED_RELEASE_TABLET | Freq: Once | ORAL | Status: AC
  Administered 2021-06-09: 40 meq via ORAL
  Filled 2021-06-09: qty 2

## 2021-06-09 MED ORDER — GABAPENTIN 300 MG PO CAPS
300.0000 mg | ORAL_CAPSULE | Freq: Once | ORAL | Status: AC
  Administered 2021-06-10: 300 mg via ORAL
  Filled 2021-06-09: qty 1

## 2021-06-09 MED ORDER — MIRTAZAPINE 15 MG PO TABS
15.0000 mg | ORAL_TABLET | Freq: Every day | ORAL | Status: DC
  Administered 2021-06-10: 15 mg via ORAL
  Filled 2021-06-09 (×3): qty 1

## 2021-06-09 MED ORDER — MAGNESIUM OXIDE -MG SUPPLEMENT 400 (240 MG) MG PO TABS
400.0000 mg | ORAL_TABLET | Freq: Once | ORAL | Status: AC
  Administered 2021-06-10: 400 mg via ORAL
  Filled 2021-06-09: qty 1

## 2021-06-09 MED ORDER — MECLIZINE HCL 25 MG PO TABS
12.5000 mg | ORAL_TABLET | Freq: Once | ORAL | Status: AC
  Administered 2021-06-09: 12.5 mg via ORAL
  Filled 2021-06-09: qty 1

## 2021-06-09 MED ORDER — METFORMIN HCL 500 MG PO TABS
500.0000 mg | ORAL_TABLET | Freq: Once | ORAL | Status: DC
  Filled 2021-06-09: qty 1

## 2021-06-09 MED ORDER — FOLIC ACID 1 MG PO TABS
1.0000 mg | ORAL_TABLET | Freq: Every day | ORAL | Status: DC
  Administered 2021-06-10: 1 mg via ORAL
  Filled 2021-06-09: qty 1

## 2021-06-09 MED ORDER — LACTATED RINGERS IV BOLUS
500.0000 mL | Freq: Once | INTRAVENOUS | Status: AC
  Administered 2021-06-09: 500 mL via INTRAVENOUS

## 2021-06-09 NOTE — ED Provider Notes (Signed)
[facility] EMERGENCY DEPARTMENT Provider Note   CSN: [id number] Arrival date & time: 06/09/21  1941     History  Chief Complaint  Patient presents with   Dizziness    Sherria Riemann is a 76 y.o. 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    Presents to the emergency department with a complaint of unsteady gait and dizziness.  Patient reports that she woke this morning at approximately 7 AM.  Patient states that she felt "wobbly like she was going to fall."  Patient reports that this feeling has persisted throughout the day.  Patient denies any feeling of room spinning.  Patient states that prior to falling asleep last night she did not have this feeling of dizziness and unstable gait.  Patient states that she has had head "fullness and pressure," but has been constant over the last 7 days.  Patient denies any recent falls or injuries.  No changes in medications.  Denies any illicit drug use or alcohol use.  Patient endorses shortness of breath however states that this is chronic and she is at her baseline.  Denies any facial asymmetry, dysarthria, numbness, weakness, facial asymmetry, fever, chills, chest pain, palpitations, leg swelling or tenderness, abdominal pain, nausea, vomiting, dysuria, hematuria, lightheadedness, syncope, seizures.     Dizziness Associated symptoms: shortness of breath   Associated symptoms: no chest pain, no headaches, no nausea, no vomiting and no weakness       Home Medications Prior to Admission medications   Medication Sig Start Date End Date Taking? Authorizing Provider  acetaminophen (TYLENOL) 325 MG tablet Take 650 mg by mouth every 6 (six) hours as needed for mild pain or headache.    [provider]  albuterol (PROVENTIL  HFA;VENTOLIN HFA) 108 (90 Base) MCG/ACT inhaler Inhale 1-2 puffs into the lungs every 6 (six) hours as needed for wheezing or shortness of breath.    [provider]  desonide (DESOWEN) 0.05 % cream Apply topically 2 (two) times daily. 09/03/20   Raspet, Derry Skill, PA-C  fluticasone (FLONASE) 50 MCG/ACT nasal spray Place 1 spray into both nostrils as needed for allergies or rhinitis.     [provider]  furosemide (LASIX) 20 MG tablet Take 1 tablet (20 mg total) by mouth daily. 07/12/18   Regalado, Belkys A, MD  gabapentin (NEURONTIN) 300 MG capsule Take 300 mg by mouth 2 (two) times daily. Taking twice daily 10/26/17   [provider]  hydrochlorothiazide (MICROZIDE) 12.5 MG capsule Take 12.5 mg by mouth daily. 04/21/20   [provider]  HYDROcodone-acetaminophen (NORCO/VICODIN) 5-325 MG tablet Take 1-2 tablets by mouth every 6 (six) hours as needed. 02/17/20   Davonna Belling, MD  ipratropium-albuterol (DUONEB) 0.5-2.5 (3) MG/3ML SOLN Take 3 mLs by nebulization 2 (two) times daily. 07/11/18   Regalado, Belkys A, MD  lisinopril (PRINIVIL,ZESTRIL) 20 MG tablet Take 20 mg by mouth daily. 04/11/18   [provider]  metFORMIN (GLUCOPHAGE) 500 MG tablet Take 1 tablet (500 mg total) by mouth daily with breakfast. 04/23/20   Little Ishikawa, MD  mirtazapine (REMERON) 15 MG tablet Take 1 tablet (15 mg total) by mouth at bedtime. 04/23/19   Robyn Haber, MD  mometasone-formoterol (DULERA) 200-5 MCG/ACT AERO Inhale 2 puffs into the lungs 2 (two) times daily. 07/11/18   Regalado, Belkys A, MD  pantoprazole (PROTONIX) 40  MG tablet Take 1 tablet (40 mg total) by mouth at bedtime. 07/11/18   Regalado, Belkys A, MD  potassium chloride SA (K-DUR,KLOR-CON) 20 MEQ tablet Take 20 mEq by mouth daily. 10/26/17   [provider]  predniSONE (DELTASONE) 10 MG tablet Take 1 tablet (10 mg total) by mouth daily with breakfast. 04/23/19   Robyn Haber, MD  simvastatin  (ZOCOR) 40 MG tablet Take 40 mg by mouth at bedtime. 03/10/20   [provider]  triamcinolone cream (KENALOG) 0.5 % Apply 1 application topically 3 (three) times daily. 10/06/17   [provider]  Vitamin D, Ergocalciferol, (DRISDOL) 1.25 MG (50000 UNIT) CAPS capsule Take 50,000 Units by mouth every Sunday. 04/17/20   [provider]  escitalopram (LEXAPRO) 5 MG tablet Take 5 mg by mouth daily. 06/15/18 04/23/19  [provider]      Allergies    Prozac [fluoxetine hcl] and Vit d-vit e-safflower oil    Review of Systems   Review of Systems  Constitutional:  Negative for chills and fever.  Eyes:  Negative for visual disturbance.  Respiratory:  Positive for shortness of breath.   Cardiovascular:  Negative for chest pain.  Gastrointestinal:  Negative for abdominal pain, nausea and vomiting.  Genitourinary:  Negative for difficulty urinating and dysuria.  Musculoskeletal:  Negative for back pain and neck pain.  Skin:  Negative for color change and rash.  Neurological:  Positive for dizziness. Negative for tremors, seizures, syncope, facial asymmetry, speech difficulty, weakness, light-headedness, numbness and headaches.  Psychiatric/Behavioral:  Negative for confusion.    Physical Exam Updated Vital Signs BP 113/70    Pulse 95    Resp (!) 22    Ht 5\' 5"  (1.651 m)    Wt 92.1 kg    SpO2 90%    BMI 33.78 kg/m  Physical Exam Vitals and nursing note reviewed.  Constitutional:      General: She is not in acute distress.    Appearance: She is not ill-appearing, toxic-appearing or diaphoretic.  Eyes:     General: No scleral icterus.       Right eye: No discharge.        Left eye: No discharge.     Extraocular Movements: Extraocular movements intact.     Pupils: Pupils are equal, round, and reactive to light.  Cardiovascular:     Rate and Rhythm: Normal rate.  Pulmonary:     Effort: Pulmonary effort is normal. No tachypnea, bradypnea or respiratory distress.      Breath sounds: Normal breath sounds. No stridor.  Abdominal:     Palpations: Abdomen is soft.     Tenderness: There is no abdominal tenderness.  Musculoskeletal:     Cervical back: Normal range of motion and neck supple. No rigidity.  Skin:    General: Skin is warm and dry.  Neurological:     General: No focal deficit present.     Mental Status: She is alert and oriented to person, place, and time.     GCS: GCS eye subscore is 4. GCS verbal subscore is 5. GCS motor subscore is 6.     Cranial Nerves: No cranial nerve deficit or facial asymmetry.     Sensory: Sensation is intact.     Motor: No weakness, tremor, seizure activity or pronator drift.     Coordination: Romberg sign negative. Finger-Nose-Finger Test normal.     Gait: Gait abnormal.     Comments: CN II-XII intact; performed in supine position due to  dizziness, +5 strength to bilateral upper extremities, +5 strength to dorsiflexion and plantarflexion, patient able to lift both legs against gravity and hold each there without difficulty, sensation to light touch grossly intact to bilateral upper and lower extremities.  Patient has unsteadiness when standing.  Able to take steps with support x2  Psychiatric:        Behavior: Behavior is cooperative.    ED Results / Procedures / Treatments   Labs (all labs ordered are listed, but only abnormal results are displayed) Labs Reviewed  CBC - Abnormal; Notable for the following components:      Result Value   RBC 3.70 (*)    Hemoglobin 11.3 (*)    All other components within normal limits  COMPREHENSIVE METABOLIC PANEL - Abnormal; Notable for the following components:   Potassium 3.2 (*)    BUN 5 (*)    Creatinine, Ser 1.01 (*)    Calcium 8.2 (*)    Total Protein 6.0 (*)    Albumin 2.6 (*)    GFR, Estimated 58 (*)    All other components within normal limits  URINALYSIS, ROUTINE W REFLEX MICROSCOPIC - Abnormal; Notable for the following components:   Color, Urine STRAW  (*)    Specific Gravity, Urine 1.004 (*)    All other components within normal limits  MAGNESIUM - Abnormal; Notable for the following components:   Magnesium 1.3 (*)    All other components within normal limits  PROTIME-INR  APTT  DIFFERENTIAL  CBG MONITORING, ED    EKG EKG Interpretation  Date/Time:  Tuesday June 09 2021 19:57:23 EST Ventricular Rate:  93 PR Interval:  155 QRS Duration: 165 QT Interval:  434 QTC Calculation: 540 R Axis:   -5 Text Interpretation: Sinus rhythm Left bundle branch block Similar to Jan 2022 tracing Confirmed by Nanda Quinton [phone]) on 06/09/2021 8:02:23 PM  Radiology CT HEAD WO CONTRAST (5MM)  Result Date: 06/09/2021 CLINICAL DATA:  Dizziness/unsteadiness. Neuro deficit, stroke suspected. EXAM: CT HEAD WITHOUT CONTRAST TECHNIQUE: Contiguous axial images were obtained from the base of the skull through the vertex without intravenous contrast. RADIATION DOSE REDUCTION: This exam was performed according to the departmental dose-optimization program which includes automated exposure control, adjustment of the mA and/or kV according to patient size and/or use of iterative reconstruction technique. COMPARISON:  None. FINDINGS: Brain: No acute intracranial hemorrhage, midline shift or mass effect. No extra-axial fluid collection. Mild diffuse atrophy is noted. Periventricular white matter hypodensities are present bilaterally. No hydrocephalus. There is a vague hypodense region in the inferior aspect of the cerebellum on the left Vascular: Atherosclerotic calcification of the carotid siphons. No hyperdense vessel. Skull: Normal. Negative for fracture or focal lesion. Sinuses/Orbits: No acute finding. Other: None. IMPRESSION: 1. Vague hypodensity in the inferior aspect of the cerebellum on the left and the possibility of acute/subacute infarct can not be excluded. MRI is recommended for further evaluation. 2. Atrophy with chronic microvascular ischemic changes.  Electronically Signed   By: Brett Fairy M.D.   On: 06/09/2021 20:46   DG Chest Portable 1 View  Result Date: 06/09/2021 CLINICAL DATA:  Shortness of breath. EXAM: PORTABLE CHEST 1 VIEW COMPARISON:  04/22/2020. FINDINGS: The heart is enlarged atherosclerotic calcification of the aorta is noted. The pulmonary vasculature is mildly distended. No consolidation, effusion, or pneumothorax. A nodular density is again seen in the right upper lobe, question granuloma. No acute osseous abnormality. There has been interval placement of a right chest port with the tip terminating  over the right atrium. Surgical clips are present in the right axilla. IMPRESSION: 1. Cardiomegaly with mildly distended pulmonary vasculature. 2. Nodular density over the right upper lobe, not significantly changed from the prior exam and may represent a granuloma versus nodule. CT should be considered for further evaluation on a nonemergent basis. Electronically Signed   By: Brett Fairy M.D.   On: 06/09/2021 20:25    Procedures Procedures    Medications Ordered in ED Medications  magnesium oxide (MAG-OX) tablet 400 mg (has no administration in time range)  meclizine (ANTIVERT) tablet 12.5 mg (12.5 mg Oral Given 06/09/21 2011)  lactated ringers bolus 500 mL (0 mLs Intravenous Stopped 06/09/21 2159)  potassium chloride SA (KLOR-CON M) CR tablet 40 mEq (40 mEq Oral Given 06/09/21 2159)    ED Course/ Medical Decision Making/ A&P                           Medical Decision Making Amount and/or Complexity of Data Reviewed Labs: ordered. Radiology: ordered.  Risk OTC drugs. Prescription drug management.   Alert 76 year old female in no acute distress, nontoxic-appearing.  Presents to the emergency department with a chief complaint of dizziness and unsteady gait.  Information is obtained from patient.  Past medical records reviewed including previous provider notes, labs, imaging.  Patient has past medical history as outlined  in HPI which complicates her care.  Patient has unsteadiness when moved to a standing position.  She is able to take a few steps with distance x2.  Remainder of neuro exam is reassuring.  Due to current lung cancer concern for possible mets to brain.  Will obtain noncontrast head CT.  If unremarkable there is further concern for acute CVA.  Patient will likely need MRI.  Labs were independent interpreted by myself.  Pertinent findings include: -Potassium 3.2, magnesium 1.3.   -Calcium corrected for hypoalbuminemia is 9.3 -POC CBG 70. -UA unremarkable  Patient was given p.o. food and drink to do CBG 70  Patient reports that she has history of hypokalemia and takes potassium daily.  We will replete potassium and magnesium while in emergency department.  Patient will need to follow-up with PCP in outpatient setting for repeat testing.  Chest x-ray was independently interpreted by myself.  I agree with radiology interpretation of cardiomegaly with mildly distended pulmonary vasculature.  Nodular density over the right upper lobe.  Noncontrast head CT shows vague hypodensity in the inferior aspect of the cerebellum on the left and the possibility of acute infarct cannot be excluded.  Due to patient's report of ataxia and findings of head CT scan will obtain MRI to rule out acute CVA at this time.  Patient reports that after receiving Antivert her dizziness has improved.  Patient continues to complain of head pressure.  Patient is requesting that she receive her home medications including gabapentin, Remeron, simvastatin, and metformin.  MRI pending at time of handoff.  If MRI shows no acute infarct plan to ambulate patient.  If she is able to stand and ambulate without difficulty plan to discharge home.  If she has continued ataxia will consider admission for symptomatic vertigo.  Patient care transferred to PA Petrucelli at the end of my shift. Patient presentation, ED course, and plan of care  discussed with review of all pertinent labs and imaging. Please see his/her note for further details regarding further ED course and disposition.         Final Clinical Impression(s) /  ED Diagnoses Final diagnoses:  None    Rx / DC Orders ED Discharge Orders     None         Dyann Ruddle 06/09/21 2335    Margette Fast, MD 06/11/21 1214

## 2021-06-09 NOTE — ED Provider Notes (Signed)
23:30: Assumed care from PA Badalamente @ shift change pending MRI and disposition.   Please see prior provider note for full H&P.   Physical Exam  BP 126/73    Pulse 87    Temp 98.8 F (37.1 C) (Oral)    Resp 19    Ht 5\' 5"  (1.651 m)    Wt 92.1 kg    SpO2 99%    BMI 33.78 kg/m   Physical Exam Vitals and nursing note reviewed.  Constitutional:      General: She is not in acute distress.    Appearance: She is well-developed.  HENT:     Head: Normocephalic and atraumatic.  Eyes:     General:        Right eye: No discharge.        Left eye: No discharge.     Conjunctiva/sclera: Conjunctivae normal.  Pulmonary:     Breath sounds: No wheezing.  Musculoskeletal:     Comments: Trace LE edema, no calf tenderness.   Neurological:     Mental Status: She is alert.     Comments: Clear speech.   Psychiatric:        Behavior: Behavior normal.        Thought Content: Thought content normal.    Procedures  Procedures  ED Course / MDM    Results for orders placed or performed during the hospital encounter of 06/09/21  Protime-INR  Result Value Ref Range   Prothrombin Time 13.6 11.4 - 15.2 seconds   INR 1.0 0.8 - 1.2  APTT  Result Value Ref Range   aPTT 30 24 - 36 seconds  CBC  Result Value Ref Range   WBC 10.0 4.0 - 10.5 K/uL   RBC 3.70 (L) 3.87 - 5.11 MIL/uL   Hemoglobin 11.3 (L) 12.0 - 15.0 g/dL   HCT 36.4 36.0 - 46.0 %   MCV 98.4 80.0 - 100.0 fL   MCH 30.5 26.0 - 34.0 pg   MCHC 31.0 30.0 - 36.0 g/dL   RDW 13.1 11.5 - 15.5 %   Platelets 287 150 - 400 K/uL   nRBC 0.0 0.0 - 0.2 %  Differential  Result Value Ref Range   Neutrophils Relative % 56 %   Neutro Abs 5.6 1.7 - 7.7 K/uL   Lymphocytes Relative 33 %   Lymphs Abs 3.3 0.7 - 4.0 K/uL   Monocytes Relative 9 %   Monocytes Absolute 0.9 0.1 - 1.0 K/uL   Eosinophils Relative 1 %   Eosinophils Absolute 0.1 0.0 - 0.5 K/uL   Basophils Relative 1 %   Basophils Absolute 0.1 0.0 - 0.1 K/uL   Immature Granulocytes 0 %    Abs Immature Granulocytes 0.03 0.00 - 0.07 K/uL  Comprehensive metabolic panel  Result Value Ref Range   Sodium 138 135 - 145 mmol/L   Potassium 3.2 (L) 3.5 - 5.1 mmol/L   Chloride 101 98 - 111 mmol/L   CO2 28 22 - 32 mmol/L   Glucose, Bld 70 70 - 99 mg/dL   BUN 5 (L) 8 - 23 mg/dL   Creatinine, Ser 1.01 (H) 0.44 - 1.00 mg/dL   Calcium 8.2 (L) 8.9 - 10.3 mg/dL   Total Protein 6.0 (L) 6.5 - 8.1 g/dL   Albumin 2.6 (L) 3.5 - 5.0 g/dL   AST 20 15 - 41 U/L   ALT 10 0 - 44 U/L   Alkaline Phosphatase 40 38 - 126 U/L   Total Bilirubin 0.5 0.3 -  1.2 mg/dL   GFR, Estimated 58 (L) >60 mL/min   Anion gap 9 5 - 15  Urinalysis, Routine w reflex microscopic  Result Value Ref Range   Color, Urine STRAW (A) YELLOW   APPearance CLEAR CLEAR   Specific Gravity, Urine 1.004 (L) 1.005 - 1.030   pH 5.0 5.0 - 8.0   Glucose, UA NEGATIVE NEGATIVE mg/dL   Hgb urine dipstick NEGATIVE NEGATIVE   Bilirubin Urine NEGATIVE NEGATIVE   Ketones, ur NEGATIVE NEGATIVE mg/dL   Protein, ur NEGATIVE NEGATIVE mg/dL   Nitrite NEGATIVE NEGATIVE   Leukocytes,Ua NEGATIVE NEGATIVE  Magnesium  Result Value Ref Range   Magnesium 1.3 (L) 1.7 - 2.4 mg/dL  POC CBG, ED  Result Value Ref Range   Glucose-Capillary 70 70 - 99 mg/dL  CBG monitoring, ED  Result Value Ref Range   Glucose-Capillary 63 (L) 70 - 99 mg/dL  CBG monitoring, ED  Result Value Ref Range   Glucose-Capillary 79 70 - 99 mg/dL   CT HEAD WO CONTRAST (5MM)  Result Date: 06/09/2021 CLINICAL DATA:  Dizziness/unsteadiness. Neuro deficit, stroke suspected. EXAM: CT HEAD WITHOUT CONTRAST TECHNIQUE: Contiguous axial images were obtained from the base of the skull through the vertex without intravenous contrast. RADIATION DOSE REDUCTION: This exam was performed according to the departmental dose-optimization program which includes automated exposure control, adjustment of the mA and/or kV according to patient size and/or use of iterative reconstruction technique.  COMPARISON:  None. FINDINGS: Brain: No acute intracranial hemorrhage, midline shift or mass effect. No extra-axial fluid collection. Mild diffuse atrophy is noted. Periventricular white matter hypodensities are present bilaterally. No hydrocephalus. There is a vague hypodense region in the inferior aspect of the cerebellum on the left Vascular: Atherosclerotic calcification of the carotid siphons. No hyperdense vessel. Skull: Normal. Negative for fracture or focal lesion. Sinuses/Orbits: No acute finding. Other: None. IMPRESSION: 1. Vague hypodensity in the inferior aspect of the cerebellum on the left and the possibility of acute/subacute infarct can not be excluded. MRI is recommended for further evaluation. 2. Atrophy with chronic microvascular ischemic changes. Electronically Signed   By: Brett Fairy M.D.   On: 06/09/2021 20:46   CT Angio Chest PE W and/or Wo Contrast  Result Date: 06/10/2021 CLINICAL DATA:  Chest pain and shortness of breath. History of breast cancer. EXAM: CT ANGIOGRAPHY CHEST WITH CONTRAST TECHNIQUE: Multidetector CT imaging of the chest was performed using the standard protocol during bolus administration of intravenous contrast. Multiplanar CT image reconstructions and MIPs were obtained to evaluate the vascular anatomy. RADIATION DOSE REDUCTION: This exam was performed according to the departmental dose-optimization program which includes automated exposure control, adjustment of the mA and/or kV according to patient size and/or use of iterative reconstruction technique. CONTRAST:  62mL OMNIPAQUE IOHEXOL 350 MG/ML SOLN COMPARISON:  Chest radiograph dated 06/01/2021. FINDINGS: Cardiovascular: There is cardiomegaly. No pericardial effusion. There is coronary vascular calcification. Mild atherosclerotic calcification of the thoracic aorta. No aneurysmal dilatation or dissection. Evaluation of the pulmonary arteries is limited due to respiratory motion artifact and suboptimal  visualization the peripheral branches. No pulmonary artery embolus identified. Mediastinum/Nodes: No hilar or mediastinal adenopathy. The esophagus is grossly unremarkable. No mediastinal fluid collection. Lungs/Pleura: Scattered pulmonary nodules measure up to 13 mm in the right lower lobe. Findings concerning for metastatic disease in this patient with history of breast cancer. Clinical correlation is recommended. No focal consolidation, pleural effusion, or pneumothorax. The central airways are patent. Upper Abdomen: A 4 mm nonobstructing right renal upper pole  calculus. Fatty liver. Musculoskeletal: Osteopenia. No acute osseous pathology. Right-sided Port-A-Cath tip over central SVC. Review of the MIP images confirms the above findings. IMPRESSION: 1. No CT evidence of pulmonary artery embolus. 2. Multiple bilateral pulmonary nodules concerning for metastatic disease in this patient with history of breast cancer. 3. Cardiomegaly with coronary vascular calcification. 4. Fatty liver. 5. A 4 mm nonobstructing right renal upper pole calculus. 6. Aortic Atherosclerosis (ICD10-I70.0). Electronically Signed   By: Anner Crete M.D.   On: 06/10/2021 02:47   MR BRAIN WO CONTRAST  Result Date: 06/09/2021 CLINICAL DATA:  Unsteady gait and dizziness EXAM: MRI HEAD WITHOUT CONTRAST TECHNIQUE: Multiplanar, multiecho pulse sequences of the brain and surrounding structures were obtained without intravenous contrast. COMPARISON:  None. FINDINGS: Brain: No acute infarct, mass effect or extra-axial collection. No acute or chronic hemorrhage. Normal white matter signal, parenchymal volume and CSF spaces. The midline structures are normal. Vascular: Major flow voids are preserved. Skull and upper cervical spine: Normal calvarium and skull base. Visualized upper cervical spine and soft tissues are normal. Sinuses/Orbits:No paranasal sinus fluid levels or advanced mucosal thickening. No mastoid or middle ear effusion. Normal  orbits. IMPRESSION: Normal brain MRI. Electronically Signed   By: Ulyses Jarred M.D.   On: 06/09/2021 23:58   DG Chest Portable 1 View  Result Date: 06/09/2021 CLINICAL DATA:  Shortness of breath. EXAM: PORTABLE CHEST 1 VIEW COMPARISON:  04/22/2020. FINDINGS: The heart is enlarged atherosclerotic calcification of the aorta is noted. The pulmonary vasculature is mildly distended. No consolidation, effusion, or pneumothorax. A nodular density is again seen in the right upper lobe, question granuloma. No acute osseous abnormality. There has been interval placement of a right chest port with the tip terminating over the right atrium. Surgical clips are present in the right axilla. IMPRESSION: 1. Cardiomegaly with mildly distended pulmonary vasculature. 2. Nodular density over the right upper lobe, not significantly changed from the prior exam and may represent a granuloma versus nodule. CT should be considered for further evaluation on a nonemergent basis. Electronically Signed   By: Brett Fairy M.D.   On: 06/09/2021 20:25    Medical Decision Making Amount and/or Complexity of Data Reviewed Labs: ordered. Radiology: ordered.  Risk OTC drugs. Prescription drug management. Decision regarding hospitalization.   MRI: negative   On my assessment patient reports she has had some head pressure for about 1 week, moderate in severity, not necessarily painful, and today started to feel dizzy and off balance, worse with attempts to ambulate. She also mentions she has had some dyspnea and cough productive of phlegm sputum. She has a hx of lung cancer, reports she is receiving q 3 week chemotherapy, last tx 2 weeks prior. States it has not metastasized. She states her dizziness feels better after meclizine, still had some head pressure, tylenol ordered, will attempt ambulation.   Patient noting to be come hypoxic with minimal movement on the stretcher, remaining hypoxic in the upper 80s at rest, eventually  improves some to 90s but desaturates. Placed on 2L via [place]. No wheezing. She wears CPAP at night, but has been desaturating upright and awake, she does not wear O2 at baseline. She became dizzy when she tried to stand up- did not ambulate. Work-up reviewed:   Initial CBG: 70--> CBG in the 60s when repeated by RN, given something to eat with improvement into the 70s.    CBC: mild anemia.  CMP: mild hypokalemia Magnesium: somewhat low  APTT/PT/INR: WNL UA; no UTI.  --->  Prior provider supplemented magnesium & potassium orally.  CT head wo:  1. Vague hypodensity in the inferior aspect of the cerebellum on the left and the possibility of acute/subacute infarct can not be excluded. MRI is recommended for further evaluation. 2. Atrophy with chronic microvascular ischemic changes.  MRI brain normal.  CXR: 1. Cardiomegaly with mildly distended pulmonary vasculature. 2. Nodular density over the right upper lobe, not significantly changed from the prior exam and may represent a granuloma versus nodule. CT should be considered for further evaluation on a nonemergent basis.   At this time suspecting lung mets vs. CHF vs  possible PE As causes of hypoxia. CTA ordered, additionally will check troponin, BNP, and COVID testing. Patient will require admission.   CTA: 1. No CT evidence of pulmonary artery embolus. 2. Multiple bilateral pulmonary nodules concerning for metastatic disease in this patient with history of breast cancer. 3. Cardiomegaly with coronary vascular calcification. 4. Fatty liver. 5. A 4 mm nonobstructing right renal upper pole calculus. 6. Aortic Atherosclerosis  BNP and troponin mildly elevated COVID/flu negative.   Attending Dr. Randal Buba has discussed with hospitalist Dr. Sidney Ace who accepts admission.   Discussed findings and plan of care with Dr. Randal Buba who is in agreement. Patient and her daughter updated on results & plan of care.        Leafy Kindle 06/10/21  [place], April, MD 06/10/21 [phone]

## 2021-06-09 NOTE — ED Notes (Signed)
CBG 70. PA Badalamente notified and cleared pt for oral intake. Pt provided with apple juice and encouraged to drink. Daughter at bedside.

## 2021-06-09 NOTE — ED Notes (Signed)
Patient transported to MRI 

## 2021-06-09 NOTE — ED Triage Notes (Signed)
Pt arrived via [facility] for cc of dizziness/unsteadiness beginning tonight at 7pm today when she woke from a nap. Pt reported "head fullness" x1 week, denies pain, numbness, weakness in extremities. No recent falls or injury, denies pain. PMH of lung cancer, receiving infusion treatment, shortness of breath with movement at baseline. Upon EMS arrival BP was 90/p upon arrival, EMS administered 500 cc NS via 20g IV in RH.   HR 90 BP 114/80 (post bolus) SPO2 95%  RA Cbg 124

## 2021-06-10 ENCOUNTER — Emergency Department (HOSPITAL_COMMUNITY): Payer: Medicare HMO

## 2021-06-10 ENCOUNTER — Encounter (HOSPITAL_COMMUNITY): Payer: Self-pay | Admitting: Emergency Medicine

## 2021-06-10 DIAGNOSIS — E876 Hypokalemia: Secondary | ICD-10-CM

## 2021-06-10 DIAGNOSIS — R918 Other nonspecific abnormal finding of lung field: Secondary | ICD-10-CM

## 2021-06-10 DIAGNOSIS — C3490 Malignant neoplasm of unspecified part of unspecified bronchus or lung: Secondary | ICD-10-CM | POA: Diagnosis not present

## 2021-06-10 DIAGNOSIS — R42 Dizziness and giddiness: Secondary | ICD-10-CM | POA: Diagnosis not present

## 2021-06-10 DIAGNOSIS — E785 Hyperlipidemia, unspecified: Secondary | ICD-10-CM

## 2021-06-10 DIAGNOSIS — E162 Hypoglycemia, unspecified: Secondary | ICD-10-CM

## 2021-06-10 DIAGNOSIS — K219 Gastro-esophageal reflux disease without esophagitis: Secondary | ICD-10-CM

## 2021-06-10 DIAGNOSIS — J9601 Acute respiratory failure with hypoxia: Secondary | ICD-10-CM | POA: Diagnosis not present

## 2021-06-10 DIAGNOSIS — J9692 Respiratory failure, unspecified with hypercapnia: Secondary | ICD-10-CM

## 2021-06-10 LAB — CBG MONITORING, ED
Glucose-Capillary: 63 mg/dL — ABNORMAL LOW (ref 70–99)
Glucose-Capillary: 79 mg/dL (ref 70–99)

## 2021-06-10 LAB — RESP PANEL BY RT-PCR (FLU A&B, COVID) ARPGX2
Influenza A by PCR: NEGATIVE
Influenza B by PCR: NEGATIVE
SARS Coronavirus 2 by RT PCR: NEGATIVE

## 2021-06-10 LAB — TROPONIN I (HIGH SENSITIVITY)
Troponin I (High Sensitivity): 21 ng/L — ABNORMAL HIGH (ref <18)
Troponin I (High Sensitivity): 18 ng/L — ABNORMAL HIGH (ref <18)

## 2021-06-10 LAB — BRAIN NATRIURETIC PEPTIDE: B Natriuretic Peptide: 412.4 pg/mL — ABNORMAL HIGH (ref 0.0–100.0)

## 2021-06-10 LAB — POTASSIUM: Potassium: 3.8 mmol/L (ref 3.5–5.1)

## 2021-06-10 LAB — MAGNESIUM: Magnesium: 2 mg/dL (ref 1.7–2.4)

## 2021-06-10 MED ORDER — MAGNESIUM HYDROXIDE 400 MG/5ML PO SUSP
30.0000 mL | Freq: Every day | ORAL | Status: DC | PRN
  Filled 2021-06-10: qty 30

## 2021-06-10 MED ORDER — FOLIC ACID 1 MG PO TABS: 1.0000 mg | ORAL_TABLET | Freq: Every day | ORAL | 0 refills | Status: AC

## 2021-06-10 MED ORDER — HYDROCHLOROTHIAZIDE 12.5 MG PO TABS
12.5000 mg | ORAL_TABLET | Freq: Every day | ORAL | Status: DC
Start: 2021-06-10 — End: 2021-06-10

## 2021-06-10 MED ORDER — FUROSEMIDE 20 MG PO TABS: 20.0000 mg | ORAL_TABLET | Freq: Every day | ORAL | Status: DC

## 2021-06-10 MED ORDER — HYDROCHLOROTHIAZIDE 12.5 MG PO CAPS
12.5000 mg | ORAL_CAPSULE | Freq: Every day | ORAL | Status: DC
  Filled 2021-06-10: qty 1

## 2021-06-10 MED ORDER — VITAMIN D (ERGOCALCIFEROL) 1.25 MG (50000 UNIT) PO CAPS: 50000.0000 [IU] | ORAL_CAPSULE | ORAL | Status: DC

## 2021-06-10 MED ORDER — MOMETASONE FURO-FORMOTEROL FUM 200-5 MCG/ACT IN AERO: 2.0000 | INHALATION_SPRAY | Freq: Two times a day (BID) | RESPIRATORY_TRACT | Status: DC

## 2021-06-10 MED ORDER — ALBUTEROL SULFATE (2.5 MG/3ML) 0.083% IN NEBU: 2.5000 mg | INHALATION_SOLUTION | Freq: Four times a day (QID) | RESPIRATORY_TRACT | Status: DC | PRN

## 2021-06-10 MED ORDER — SIMVASTATIN 20 MG PO TABS: 40.0000 mg | ORAL_TABLET | Freq: Every day | ORAL | Status: DC

## 2021-06-10 MED ORDER — ACETAMINOPHEN 325 MG PO TABS: 650.0000 mg | ORAL_TABLET | Freq: Four times a day (QID) | ORAL | Status: DC | PRN

## 2021-06-10 MED ORDER — IOHEXOL 350 MG/ML SOLN
54.0000 mL | Freq: Once | INTRAVENOUS | Status: AC | PRN
  Administered 2021-06-10: 54 mL via INTRAVENOUS

## 2021-06-10 MED ORDER — GABAPENTIN 300 MG PO CAPS
300.0000 mg | ORAL_CAPSULE | Freq: Two times a day (BID) | ORAL | Status: DC
  Administered 2021-06-10: 300 mg via ORAL
  Filled 2021-06-10: qty 1

## 2021-06-10 MED ORDER — ONDANSETRON HCL 4 MG/2ML IJ SOLN: 4.0000 mg | Freq: Four times a day (QID) | INTRAMUSCULAR | Status: DC | PRN

## 2021-06-10 MED ORDER — POTASSIUM CHLORIDE CRYS ER 20 MEQ PO TBCR
20.0000 meq | EXTENDED_RELEASE_TABLET | Freq: Every day | ORAL | Status: DC
  Filled 2021-06-10: qty 1

## 2021-06-10 MED ORDER — ACETAMINOPHEN 650 MG RE SUPP: 650.0000 mg | Freq: Four times a day (QID) | RECTAL | Status: DC | PRN

## 2021-06-10 MED ORDER — ACETAMINOPHEN 325 MG PO TABS
650.0000 mg | ORAL_TABLET | Freq: Once | ORAL | Status: AC
  Administered 2021-06-10: 650 mg via ORAL
  Filled 2021-06-10: qty 2

## 2021-06-10 MED ORDER — ONDANSETRON HCL 4 MG PO TABS: 4.0000 mg | ORAL_TABLET | Freq: Four times a day (QID) | ORAL | Status: DC | PRN

## 2021-06-10 MED ORDER — ENOXAPARIN SODIUM 40 MG/0.4ML IJ SOSY
40.0000 mg | PREFILLED_SYRINGE | INTRAMUSCULAR | Status: DC
  Administered 2021-06-10: 40 mg via SUBCUTANEOUS
  Filled 2021-06-10: qty 0.4

## 2021-06-10 MED ORDER — HYDROCODONE-ACETAMINOPHEN 5-325 MG PO TABS: 1.0000 | ORAL_TABLET | Freq: Four times a day (QID) | ORAL | Status: DC | PRN

## 2021-06-10 MED ORDER — MIDODRINE HCL 5 MG PO TABS: 5.0000 mg | ORAL_TABLET | Freq: Three times a day (TID) | ORAL | 0 refills | Status: AC

## 2021-06-10 MED ORDER — PANTOPRAZOLE SODIUM 40 MG PO TBEC: 40.0000 mg | DELAYED_RELEASE_TABLET | Freq: Every day | ORAL | Status: DC

## 2021-06-10 MED ORDER — FLUTICASONE PROPIONATE 50 MCG/ACT NA SUSP: 1.0000 | NASAL | Status: DC | PRN

## 2021-06-10 MED ORDER — PRAVASTATIN SODIUM 10 MG PO TABS
20.0000 mg | ORAL_TABLET | Freq: Every day | ORAL | Status: DC
Start: 2021-06-10 — End: 2021-06-10

## 2021-06-10 MED ORDER — ALBUTEROL SULFATE HFA 108 (90 BASE) MCG/ACT IN AERS: 1.0000 | INHALATION_SPRAY | Freq: Four times a day (QID) | RESPIRATORY_TRACT | Status: DC | PRN

## 2021-06-10 MED ORDER — IPRATROPIUM-ALBUTEROL 0.5-2.5 (3) MG/3ML IN SOLN
3.0000 mL | Freq: Two times a day (BID) | RESPIRATORY_TRACT | Status: DC
  Administered 2021-06-10: 3 mL via RESPIRATORY_TRACT
  Filled 2021-06-10: qty 3

## 2021-06-10 MED ORDER — TRAZODONE HCL 50 MG PO TABS: 25.0000 mg | ORAL_TABLET | Freq: Every evening | ORAL | Status: DC | PRN

## 2021-06-10 MED ORDER — POTASSIUM CHLORIDE IN NACL 20-0.9 MEQ/L-% IV SOLN
INTRAVENOUS | Status: DC
  Filled 2021-06-10: qty 1000

## 2021-06-10 MED ORDER — MIDODRINE HCL 5 MG PO TABS
5.0000 mg | ORAL_TABLET | Freq: Three times a day (TID) | ORAL | Status: DC
  Administered 2021-06-10: 5 mg via ORAL
  Filled 2021-06-10: qty 1

## 2021-06-10 MED ORDER — MIRTAZAPINE 15 MG PO TABS
15.0000 mg | ORAL_TABLET | Freq: Every day | ORAL | Status: DC
  Filled 2021-06-10: qty 1

## 2021-06-10 MED ORDER — MAGNESIUM SULFATE 2 GM/50ML IV SOLN
2.0000 g | Freq: Once | INTRAVENOUS | Status: AC
Start: 2021-06-10 — End: 2021-06-10
  Administered 2021-06-10: 2 g via INTRAVENOUS
  Filled 2021-06-10: qty 50

## 2021-06-10 NOTE — Assessment & Plan Note (Signed)
We will continue CPAP nightly. 

## 2021-06-10 NOTE — ED Notes (Signed)
Pt desatted to 80% while sleeping, placed on 2L [place] ?

## 2021-06-10 NOTE — Assessment & Plan Note (Signed)
-   This could be secondary to metastatic lung disease. ?- O2 protocol will be followed. ?

## 2021-06-10 NOTE — Assessment & Plan Note (Signed)
-   We will continue PPI therapy 

## 2021-06-10 NOTE — ED Notes (Signed)
PA Petrucelli notified of CBG. Pt provided apple sauce and apple juice. Metformin held at this time.  ?

## 2021-06-10 NOTE — Assessment & Plan Note (Signed)
-   She will be admitted to a medical telemetry observation bed. ?- Her dizziness is likely multifactorial and could be mainly related to hypoglycemia as well as hypoxia. ?- Management as below. ?- We will add as needed Antivert. ?

## 2021-06-10 NOTE — Assessment & Plan Note (Addendum)
This is associated with type 2 diabetes mellitus. ?- This could certainly be contributing to her dizziness. ?- We will follow fingerstick blood glucose measures. ?- The patient will be placed on supplement coverage with NovoLog. ?- We will still hold off metformin as the patient just received IV contrast. ?- We will hold off any sulfonylurea. ?

## 2021-06-10 NOTE — Assessment & Plan Note (Signed)
-   Potassium will be aggressively replaced. ?

## 2021-06-10 NOTE — Assessment & Plan Note (Signed)
We will continue her antihypertensive therapy. ?

## 2021-06-10 NOTE — Assessment & Plan Note (Signed)
-   Oncology follow-up can be called in AM, especially given active chemotherapy, new pulmonary nodules and history of breast cancer. ?

## 2021-06-10 NOTE — H&P (Signed)
[place]   PATIENT NAME: Charlene Houston    MR#:  [id number]  DATE OF BIRTH:  [birth year]  DATE OF ADMISSION:  06/09/2021  PRIMARY CARE PHYSICIAN: Lilian Coma., MD   Patient is coming from: Home  REQUESTING/REFERRING PHYSICIAN: Palumbo, April, MD  CHIEF COMPLAINT:   Chief Complaint  Patient presents with   Dizziness    HISTORY OF PRESENT ILLNESS:  Charlene Houston is a 76 y.o. 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  She felt wobbly as if she was going to fall and continued to have this feeling throughout the day.  She denies any vertigo or tinnitus though.  She did have symptoms before going to bed.  She admits to a feeling of head fullness and pressure which has been going on over the last week.  No chest pain or palpitations.  She admits to cough productive of clear sputum as well as dyspnea without wheezing.  She been feeling sleepy and tired.  No falls or injuries.  No recent new medications or herbal supplements.  While the patient was in the ER she was noted to have intermittent episodes of hypoglycemia.  She was also noted to desat to the low 80s and once she dropped to 73% on room air.  Pulsoxymeter was to normal on 2 L by nasal cannula.  ED Course: When she came to the ER vital signs were within normal.  Later she desatted as mentioned above and pulse symmetry was her percent on 2 L of O2 by nasal cannula.  Labs revealed mild hypokalemia of 3.2 and hypomagnesemia 1.3 with a creatinine of 1.01 and total protein of 6 with albumin of 2.6 CBC showed hemoglobin of 11.3 and hematocrit 36.4. 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 Imaging: Portable chest ray showed the following: Cardiomegaly with mild central pulmonary vasculature and nodular density over the right upper lobe that is not significantly changed from the prior exam and may represent granulomatous lesion versus nodule.  Chest CTA showed no evidence for PE.  It showed multiple bilateral pulmonary nodules concerning for metastatic disease given history of breast cancer, cardiomegaly with coronary vascular calcification, fatty liver, 4 mm nonobstructing right renal upper pole calculus and aortic atherosclerosis.  Noncontrasted CT scan showed vague hypodensity in the inferior aspect of the cerebellum on the left than the possibility of acute/subacute infarct cannot be excluded with recommendation for MRI.  It showed atrophy with chronic microvascular ischemic changes.  Brain MRI without contrast was normal.  The patient was given 500 mg of IV lactated Ringer, 400 mg of p.o. magnesium oxide twice, 40 mill Cabbell p.o. potassium chloride, 12.5 mg p.o. meclizine, 650 mg p.o. Tylenol and 300 mg of p.o. Neurontin.  She will be admitted to a medical telemetry observation bed for further evaluation and management.  PAST MEDICAL HISTORY:   Past Medical History:  Diagnosis Date   Arthritis    Asthma    Breast cancer ([place])    Diabetes mellitus without complication ([place])    Hypertension    SBO (small bowel  obstruction) ([place]) 06/2018  -Non-small cell lung cancer with metastatic disease to contralateral lung, receiving chemotherapy with last infusion on 05/28/2021  PAST SURGICAL HISTORY:   Past Surgical History:  Procedure Laterality Date   ABDOMINAL HYSTERECTOMY     THUMB AMPUTATION Left    PARTIAL   TONSILLECTOMY      SOCIAL HISTORY:   Social History   Tobacco Use   Smoking status: Former    Types: Cigarettes    Quit date: 1995    Years since quitting:  28.1   Smokeless tobacco: Never  Substance Use Topics   Alcohol use: Not Currently    FAMILY HISTORY:   Family History  Problem Relation Age of Onset   Hypertension Mother    Diabetes Mother    Cancer Father     DRUG ALLERGIES:   Allergies  Allergen Reactions   Prozac [Fluoxetine Hcl] Other (See Comments)    Gittery   Vit D-Vit E-Safflower Oil Other (See Comments)    Not specified    REVIEW OF SYSTEMS:   ROS As per history of present illness. All pertinent systems were reviewed above. Constitutional, HEENT, cardiovascular, respiratory, GI, GU, musculoskeletal, neuro, psychiatric, endocrine, integumentary and hematologic systems were reviewed and are otherwise negative/unremarkable except for positive findings mentioned above in the HPI.   MEDICATIONS AT HOME:   Prior to Admission medications   Medication Sig Start Date End Date Taking? Authorizing Provider  fluticasone (FLONASE) 50 MCG/ACT nasal spray Place 1 spray into both nostrils as needed for allergies or rhinitis.    Yes [provider]  gabapentin (NEURONTIN) 300 MG capsule Take 300 mg by mouth 2 (two) times daily. Taking twice daily 10/26/17  Yes [provider]  hydrochlorothiazide (MICROZIDE) 12.5 MG capsule Take 12.5 mg by mouth daily. 04/21/20  Yes [provider]  potassium chloride SA (K-DUR,KLOR-CON) 20 MEQ tablet Take 20 mEq by mouth daily. 10/26/17  Yes [provider]  pravastatin (PRAVACHOL) 20 MG tablet Take 20 mg by mouth daily.   Yes [provider]  simvastatin (ZOCOR) 40 MG tablet Take 40 mg by mouth at bedtime. 03/10/20  Yes [provider]  triamcinolone cream (KENALOG) 0.5 % Apply 1 application topically 3 (three) times daily. 10/06/17  Yes [provider]  acetaminophen (TYLENOL) 325 MG tablet Take 650 mg by mouth every 6 (six) hours as needed for mild pain or headache.    [provider]  albuterol (PROVENTIL HFA;VENTOLIN HFA)  108 (90 Base) MCG/ACT inhaler Inhale 1-2 puffs into the lungs every 6 (six) hours as needed for wheezing or shortness of breath.    [provider]  desonide (DESOWEN) 0.05 % cream Apply topically 2 (two) times daily. 09/03/20   Raspet, Derry Skill, PA-C  furosemide (LASIX) 20 MG tablet Take 1 tablet (20 mg total) by mouth daily. 07/12/18   Regalado, Belkys A, MD  HYDROcodone-acetaminophen (NORCO/VICODIN) 5-325 MG tablet Take 1-2 tablets by mouth every 6 (six) hours as needed. 02/17/20   Davonna Belling, MD  ipratropium-albuterol (DUONEB) 0.5-2.5 (3) MG/3ML SOLN Take 3 mLs by nebulization 2 (two) times daily. 07/11/18   Regalado, Belkys A, MD  lisinopril (PRINIVIL,ZESTRIL) 20 MG tablet Take 20 mg by mouth daily. Patient not taking: Reported on 06/10/2021 04/11/18   [provider]  metFORMIN (GLUCOPHAGE) 500 MG tablet Take 1 tablet (500 mg total) by mouth daily with breakfast. 04/23/20   Little Ishikawa, MD  mirtazapine (REMERON) 15 MG tablet Take 1 tablet (15 mg total)  by mouth at bedtime. 04/23/19   Robyn Haber, MD  mometasone-formoterol (DULERA) 200-5 MCG/ACT AERO Inhale 2 puffs into the lungs 2 (two) times daily. 07/11/18   Regalado, Belkys A, MD  pantoprazole (PROTONIX) 40 MG tablet Take 1 tablet (40 mg total) by mouth at bedtime. 07/11/18   Regalado, Belkys A, MD  predniSONE (DELTASONE) 10 MG tablet Take 1 tablet (10 mg total) by mouth daily with breakfast. 04/23/19   Robyn Haber, MD  Vitamin D, Ergocalciferol, (DRISDOL) 1.25 MG (50000 UNIT) CAPS capsule Take 50,000 Units by mouth every Sunday. 04/17/20   [provider]  escitalopram (LEXAPRO) 5 MG tablet Take 5 mg by mouth daily. 06/15/18 04/23/19  [provider]      VITAL SIGNS:  Blood pressure 122/70, pulse 83, temperature 98.8 F (37.1 C), temperature source Oral, resp. rate 18, height 5\' 5"  (1.651 m), weight 92.1 kg, SpO2 100 %.  PHYSICAL EXAMINATION:  Physical Exam  GENERAL:  76 y.o.-year-old  African-American female patient lying in the bed with no acute distress with mild conversational dyspnea though.  EYES: Pupils equal, round, reactive to light and accommodation. No scleral icterus. Extraocular muscles intact.  HEENT: Head atraumatic, normocephalic. Oropharynx and nasopharynx clear.  NECK:  Supple, no jugular venous distention. No thyroid enlargement, no tenderness.  LUNGS: Slightly diminished breath sounds in both bases.  No wheezing, rales,rhonchi or crepitation. No use of accessory muscles of respiration.  CARDIOVASCULAR: Regular rate and rhythm, S1, S2 normal. No murmurs, rubs, or gallops.  ABDOMEN: Soft, nondistended, nontender. Bowel sounds present. No organomegaly or mass.  EXTREMITIES: No pedal edema, cyanosis, or clubbing.  NEUROLOGIC: Cranial nerves II through XII are intact. Muscle strength 5/5 in all extremities. Sensation intact. Gait not checked.  PSYCHIATRIC: The patient is alert and oriented x 3.  Normal affect and good eye contact. SKIN: No obvious rash, lesion, or ulcer.   LABORATORY PANEL:   CBC Recent Labs  Lab 06/09/21 1954  WBC 10.0  HGB 11.3*  HCT 36.4  PLT 287   ------------------------------------------------------------------------------------------------------------------  Chemistries  Recent Labs  Lab 06/09/21 1954  NA 138  K 3.2*  CL 101  CO2 28  GLUCOSE 70  BUN 5*  CREATININE 1.01*  CALCIUM 8.2*  MG 1.3*  AST 20  ALT 10  ALKPHOS 40  BILITOT 0.5   ------------------------------------------------------------------------------------------------------------------  Cardiac Enzymes No results for input(s): TROPONINI in the last 168 hours. ------------------------------------------------------------------------------------------------------------------  RADIOLOGY:  CT HEAD WO CONTRAST (5MM)  Result Date: 06/09/2021 CLINICAL DATA:  Dizziness/unsteadiness. Neuro deficit, stroke suspected. EXAM: CT HEAD WITHOUT CONTRAST  TECHNIQUE: Contiguous axial images were obtained from the base of the skull through the vertex without intravenous contrast. RADIATION DOSE REDUCTION: This exam was performed according to the departmental dose-optimization program which includes automated exposure control, adjustment of the mA and/or kV according to patient size and/or use of iterative reconstruction technique. COMPARISON:  None. FINDINGS: Brain: No acute intracranial hemorrhage, midline shift or mass effect. No extra-axial fluid collection. Mild diffuse atrophy is noted. Periventricular white matter hypodensities are present bilaterally. No hydrocephalus. There is a vague hypodense region in the inferior aspect of the cerebellum on the left Vascular: Atherosclerotic calcification of the carotid siphons. No hyperdense vessel. Skull: Normal. Negative for fracture or focal lesion. Sinuses/Orbits: No acute finding. Other: None. IMPRESSION: 1. Vague hypodensity in the inferior aspect of the cerebellum on the left and the possibility of acute/subacute infarct can not be excluded. MRI is recommended for further evaluation. 2. Atrophy with chronic microvascular  ischemic changes. Electronically Signed   By: Brett Fairy M.D.   On: 06/09/2021 20:46   CT Angio Chest PE W and/or Wo Contrast  Result Date: 06/10/2021 CLINICAL DATA:  Chest pain and shortness of breath. History of breast cancer. EXAM: CT ANGIOGRAPHY CHEST WITH CONTRAST TECHNIQUE: Multidetector CT imaging of the chest was performed using the standard protocol during bolus administration of intravenous contrast. Multiplanar CT image reconstructions and MIPs were obtained to evaluate the vascular anatomy. RADIATION DOSE REDUCTION: This exam was performed according to the departmental dose-optimization program which includes automated exposure control, adjustment of the mA and/or kV according to patient size and/or use of iterative reconstruction technique. CONTRAST:  23mL OMNIPAQUE IOHEXOL 350  MG/ML SOLN COMPARISON:  Chest radiograph dated 06/01/2021. FINDINGS: Cardiovascular: There is cardiomegaly. No pericardial effusion. There is coronary vascular calcification. Mild atherosclerotic calcification of the thoracic aorta. No aneurysmal dilatation or dissection. Evaluation of the pulmonary arteries is limited due to respiratory motion artifact and suboptimal visualization the peripheral branches. No pulmonary artery embolus identified. Mediastinum/Nodes: No hilar or mediastinal adenopathy. The esophagus is grossly unremarkable. No mediastinal fluid collection. Lungs/Pleura: Scattered pulmonary nodules measure up to 13 mm in the right lower lobe. Findings concerning for metastatic disease in this patient with history of breast cancer. Clinical correlation is recommended. No focal consolidation, pleural effusion, or pneumothorax. The central airways are patent. Upper Abdomen: A 4 mm nonobstructing right renal upper pole calculus. Fatty liver. Musculoskeletal: Osteopenia. No acute osseous pathology. Right-sided Port-A-Cath tip over central SVC. Review of the MIP images confirms the above findings. IMPRESSION: 1. No CT evidence of pulmonary artery embolus. 2. Multiple bilateral pulmonary nodules concerning for metastatic disease in this patient with history of breast cancer. 3. Cardiomegaly with coronary vascular calcification. 4. Fatty liver. 5. A 4 mm nonobstructing right renal upper pole calculus. 6. Aortic Atherosclerosis (ICD10-I70.0). Electronically Signed   By: Anner Crete M.D.   On: 06/10/2021 02:47   MR BRAIN WO CONTRAST  Result Date: 06/09/2021 CLINICAL DATA:  Unsteady gait and dizziness EXAM: MRI HEAD WITHOUT CONTRAST TECHNIQUE: Multiplanar, multiecho pulse sequences of the brain and surrounding structures were obtained without intravenous contrast. COMPARISON:  None. FINDINGS: Brain: No acute infarct, mass effect or extra-axial collection. No acute or chronic hemorrhage. Normal white  matter signal, parenchymal volume and CSF spaces. The midline structures are normal. Vascular: Major flow voids are preserved. Skull and upper cervical spine: Normal calvarium and skull base. Visualized upper cervical spine and soft tissues are normal. Sinuses/Orbits:No paranasal sinus fluid levels or advanced mucosal thickening. No mastoid or middle ear effusion. Normal orbits. IMPRESSION: Normal brain MRI. Electronically Signed   By: Ulyses Jarred M.D.   On: 06/09/2021 23:58   DG Chest Portable 1 View  Result Date: 06/09/2021 CLINICAL DATA:  Shortness of breath. EXAM: PORTABLE CHEST 1 VIEW COMPARISON:  04/22/2020. FINDINGS: The heart is enlarged atherosclerotic calcification of the aorta is noted. The pulmonary vasculature is mildly distended. No consolidation, effusion, or pneumothorax. A nodular density is again seen in the right upper lobe, question granuloma. No acute osseous abnormality. There has been interval placement of a right chest port with the tip terminating over the right atrium. Surgical clips are present in the right axilla. IMPRESSION: 1. Cardiomegaly with mildly distended pulmonary vasculature. 2. Nodular density over the right upper lobe, not significantly changed from the prior exam and may represent a granuloma versus nodule. CT should be considered for further evaluation on a nonemergent basis. Electronically Signed  By: Brett Fairy M.D.   On: 06/09/2021 20:25      IMPRESSION AND PLAN:  Assessment and Plan: * Dizziness - She will be admitted to a medical telemetry observation bed. - Her dizziness is likely multifactorial and could be mainly related to hypoglycemia as well as hypoxia. - Management as below. - We will add as needed Antivert.  Acute respiratory failure with hypoxia ([facility]) - This could be secondary to metastatic lung disease. - O2 protocol will be followed.  Hypoglycemia- (present on admission) This is associated with type 2 diabetes mellitus. - This  could certainly be contributing to her dizziness. - We will follow fingerstick blood glucose measures. - The patient will be placed on supplement coverage with NovoLog. - We will still hold off metformin as the patient just received IV contrast. - We will hold off any sulfonylurea.  HLD (hyperlipidemia)- (present on admission) We will continue statin therapy.  Non-small cell lung cancer ([place])- (present on admission) - Oncology follow-up can be called in AM, especially given active chemotherapy, new pulmonary nodules and history of breast cancer.  Hypokalemia - Potassium will be aggressively replaced.  Essential hypertension- (present on admission) We will continue her antihypertensive therapy.  Sleep apnea- (present on admission) - We will continue CPAP nightly.  GERD (gastroesophageal reflux disease) - We will continue PPI therapy   DVT prophylaxis: Lovenox.  Advanced Care Planning:  Code Status: full code.  Family Communication:  The plan of care was discussed in details with the patient (and family). I answered all questions. The patient agreed to proceed with the above mentioned plan. Further management will depend upon hospital course. Disposition Plan: Back to previous home environment Consults called: none.  All the records are reviewed and case discussed with ED provider.  Status is: Observation  I certify that at the time of admission, it is my clinical judgment that the patient will require  hospital care extending less than 2 midnights.                            Dispo: The patient is from: Home              Anticipated d/c is to: Home              Patient currently is not medically stable to d/c.              Difficult to place patient: No  Christel Mormon M.D on 06/10/2021 at 6:22 AM  [facility]   From 7 PM-7 AM, contact night-coverage [email or web address]  CC: Primary care physician; Lilian Coma., MD

## 2021-06-10 NOTE — Discharge Summary (Signed)
Charlene Houston JQZ:[id number] DOB: Dec 22, 1945 DOA: 06/09/2021  PCP: Lilian Coma., MD  Admit date: 06/09/2021 Discharge date: 06/10/2021  Admitted From: home Disposition:  home  Recommendations for Outpatient Follow-up:  Follow up with PCP in 1 week Please obtain BMP/CBC in one week Please follow up with oncology as scheduled next week     Discharge Condition:Stable CODE STATUS:full  Diet recommendation: Heart Healthy   Brief/Interim Summary: PER MAU:[id number] Charlene Houston is a 76 y.o. 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  She felt wobbly as if she was going to fall and continued to have this feeling throughout the day.  She denies any vertigo or tinnitus though.  She did have symptoms before going to bed.  She admits to a feeling of head fullness and pressure which has been going on over the last week.  No chest pain or palpitations.   Labs revealed mild hypokalemia of 3.2 and hypomagnesemia 1.3 with a creatinine of 1.01 and total protein of 6 with albumin of 2.6 CBC showed hemoglobin of 11.3 and hematocrit 36.4.  Troponin peaked at 21.  BNP 412.  COVID test negative. 1. Cardiomegaly with mildly distended pulmonary vasculature. 2. Nodular density over the right upper lobe, not significantly changed from the prior exam and may represent a granuloma versus nodule. CT should be considered for further evaluation on a nonemergent basis  She underwent CT angio revealing : 1. No CT evidence of pulmonary artery embolus. 2. Multiple bilateral pulmonary nodules concerning for metastatic disease in this patient with  history of breast cancer. 3. Cardiomegaly with coronary vascular calcification. 4. Fatty liver. 5. A 4 mm nonobstructing right renal upper pole calculus. 6. Aortic Atherosclerosis (ICD10-I70.0).  Had CT head, see full report below Then underwent MRI brain which was normal.  She was found to be hypoglycemia. Was also given dose of meclizine which appeared to help her dizziness.   This am feels better. No Dizziness. BP on soft side was started on midodrine.  She is stable for discharge   Dizziness likely multifactorial and could be mainly related to hypoglycemia as well as hypoxia. Found with low blood pressure. Started on midodrine  Was given meclizine which helped    Acute respiratory failure with hypoxia ([place]) - This could be secondary to metastatic lung disease. ON RA now resolved   Hypoglycemia- (present on admission) Need to monitor FS at home closely Has had decrease po intake per pt, encouraged to take po to avoid   HLD (hyperlipidemia)- (present on admission) continue statin therapy.   Non-small cell lung cancer ([place])- (present on admission) New pulmonary nodules on ct chest, discussed with patient She has f/u with oncology next week   Hypokalemia Replaced and stable   Essential hypertension- (present on admission) On low side Started on midodrine F/u with pcp for further management   Sleep apnea- (present on admission)  continue CPAP nightly.   GERD (gastroesophageal reflux disease) continue PPI therapy      Discharge Diagnoses:  Principal Problem:   Dizziness Active Problems:   Hypoglycemia   Essential hypertension   HLD (hyperlipidemia)   Sleep apnea   Acute respiratory failure with hypoxia ([facility])  Hypokalemia   Hypomagnesemia   GERD (gastroesophageal reflux disease)   Non-small cell lung cancer [facility])    Discharge Instructions  Discharge Instructions     Call MD for:  persistant dizziness or light-headedness   Complete by: As  directed    Diet - low sodium heart healthy   Complete by: As directed    Discharge instructions   Complete by: As directed    Hydrate, drink protein shake if not eating much F/u with pcp in one week   Increase activity slowly   Complete by: As directed       Allergies as of 06/10/2021       Reactions   Prozac [fluoxetine Hcl] Other (See Comments)   Gittery   Vit D-vit E-safflower Oil Other (See Comments)   Not specified        Medication List     STOP taking these medications    furosemide 20 MG tablet Commonly known as: LASIX   hydrochlorothiazide 12.5 MG capsule Commonly known as: MICROZIDE   lisinopril 20 MG tablet Commonly known as: ZESTRIL   metFORMIN 500 MG tablet Commonly known as: GLUCOPHAGE   potassium chloride SA 20 MEQ tablet Commonly known as: KLOR-CON M   pravastatin 20 MG tablet Commonly known as: PRAVACHOL   predniSONE 10 MG tablet Commonly known as: DELTASONE       TAKE these medications    acetaminophen 325 MG tablet Commonly known as: TYLENOL Take 650 mg by mouth every 6 (six) hours as needed for mild pain or headache.   albuterol 108 (90 Base) MCG/ACT inhaler Commonly known as: VENTOLIN HFA Inhale 1-2 puffs into the lungs every 6 (six) hours as needed for wheezing or shortness of breath.   desonide 0.05 % cream Commonly known as: DesOwen Apply topically 2 (two) times daily.   fluticasone 50 MCG/ACT nasal spray Commonly known as: FLONASE Place 1 spray into both nostrils as needed for allergies or rhinitis.   folic acid 1 MG tablet Commonly known as: FOLVITE Take 1 tablet (1 mg total) by mouth daily. Start taking on: June 11, 2021   gabapentin 300 MG capsule Commonly known as: NEURONTIN Take 300 mg by mouth 2 (two) times daily. Taking twice daily   HYDROcodone-acetaminophen 5-325 MG tablet Commonly known as: NORCO/VICODIN Take 1-2 tablets by mouth every 6 (six) hours as needed.   ipratropium-albuterol 0.5-2.5 (3)  MG/3ML Soln Commonly known as: DUONEB Take 3 mLs by nebulization 2 (two) times daily.   midodrine 5 MG tablet Commonly known as: PROAMATINE Take 1 tablet (5 mg total) by mouth 3 (three) times daily with meals for 7 days.   mirtazapine 15 MG tablet Commonly known as: REMERON Take 1 tablet (15 mg total) by mouth at bedtime.   mometasone-formoterol 200-5 MCG/ACT Aero Commonly known as: DULERA Inhale 2 puffs into the lungs 2 (two) times daily.   pantoprazole 40 MG tablet Commonly known as: PROTONIX Take 1 tablet (40 mg total) by mouth at bedtime.   simvastatin 40 MG tablet Commonly known as: ZOCOR Take 40 mg by mouth at bedtime.   triamcinolone cream 0.5 % Commonly known as: KENALOG Apply 1 application topically 3 (three) times daily.   Vitamin D (Ergocalciferol) 1.25 MG (50000 UNIT) Caps capsule Commonly known as: DRISDOL Take 50,000 Units by mouth every Sunday.        Follow-up Information     Lilian Coma., MD Follow up.   Specialty: Internal Medicine Contact information: [address]  [address] [place] [address] [phone]                Allergies  Allergen Reactions   Prozac [Fluoxetine Hcl] Other (See Comments)    Gittery   Vit D-Vit E-Safflower Oil Other (See Comments)    Not specified    Consultations:    Procedures/Studies: CT HEAD WO CONTRAST (5MM)  Result Date: 06/09/2021 CLINICAL DATA:  Dizziness/unsteadiness. Neuro deficit, stroke suspected. EXAM: CT HEAD WITHOUT CONTRAST TECHNIQUE: Contiguous axial images were obtained from the base of the skull through the vertex without intravenous contrast. RADIATION DOSE REDUCTION: This exam was performed according to the departmental dose-optimization program which includes automated exposure control, adjustment of the mA and/or kV according to patient size and/or use of iterative reconstruction technique. COMPARISON:  None. FINDINGS: Brain: No acute intracranial hemorrhage, midline  shift or mass effect. No extra-axial fluid collection. Mild diffuse atrophy is noted. Periventricular white matter hypodensities are present bilaterally. No hydrocephalus. There is a vague hypodense region in the inferior aspect of the cerebellum on the left Vascular: Atherosclerotic calcification of the carotid siphons. No hyperdense vessel. Skull: Normal. Negative for fracture or focal lesion. Sinuses/Orbits: No acute finding. Other: None. IMPRESSION: 1. Vague hypodensity in the inferior aspect of the cerebellum on the left and the possibility of acute/subacute infarct can not be excluded. MRI is recommended for further evaluation. 2. Atrophy with chronic microvascular ischemic changes. Electronically Signed   By: Brett Fairy M.D.   On: 06/09/2021 20:46   CT Angio Chest PE W and/or Wo Contrast  Result Date: 06/10/2021 CLINICAL DATA:  Chest pain and shortness of breath. History of breast cancer. EXAM: CT ANGIOGRAPHY CHEST WITH CONTRAST TECHNIQUE: Multidetector CT imaging of the chest was performed using the standard protocol during bolus administration of intravenous contrast. Multiplanar CT image reconstructions and MIPs were obtained to evaluate the vascular anatomy. RADIATION DOSE REDUCTION: This exam was performed according to the departmental dose-optimization program which includes automated exposure control, adjustment of the mA and/or kV according to patient size and/or use of iterative reconstruction technique. CONTRAST:  50mL OMNIPAQUE IOHEXOL 350 MG/ML SOLN COMPARISON:  Chest radiograph dated 06/01/2021. FINDINGS: Cardiovascular: There is cardiomegaly. No pericardial effusion. There is coronary vascular calcification. Mild atherosclerotic calcification of the thoracic aorta. No aneurysmal dilatation or dissection. Evaluation of the pulmonary arteries is limited due to respiratory motion artifact and suboptimal visualization the peripheral branches. No pulmonary artery embolus identified.  Mediastinum/Nodes: No hilar or mediastinal adenopathy. The esophagus is grossly unremarkable. No mediastinal fluid collection. Lungs/Pleura: Scattered pulmonary nodules measure up to 13 mm in the right lower lobe. Findings concerning for metastatic disease in this patient with history of breast cancer. Clinical correlation is recommended. No focal consolidation, pleural effusion, or pneumothorax. The central airways are patent. Upper Abdomen: A 4 mm nonobstructing right renal upper pole calculus. Fatty liver. Musculoskeletal: Osteopenia. No acute osseous pathology. Right-sided Port-A-Cath tip over central SVC. Review of the MIP images confirms the above findings. IMPRESSION: 1. No CT evidence of pulmonary artery embolus. 2. Multiple bilateral pulmonary nodules concerning for metastatic disease in this patient with history of breast cancer. 3. Cardiomegaly with coronary vascular calcification. 4. Fatty liver. 5. A 4 mm nonobstructing right renal upper pole calculus. 6. Aortic Atherosclerosis (ICD10-I70.0). Electronically Signed   By: Anner Crete M.D.   On: 06/10/2021 02:47   MR BRAIN WO CONTRAST  Result Date: 06/09/2021 CLINICAL DATA:  Unsteady gait and dizziness EXAM: MRI HEAD WITHOUT CONTRAST TECHNIQUE: Multiplanar, multiecho pulse  sequences of the brain and surrounding structures were obtained without intravenous contrast. COMPARISON:  None. FINDINGS: Brain: No acute infarct, mass effect or extra-axial collection. No acute or chronic hemorrhage. Normal white matter signal, parenchymal volume and CSF spaces. The midline structures are normal. Vascular: Major flow voids are preserved. Skull and upper cervical spine: Normal calvarium and skull base. Visualized upper cervical spine and soft tissues are normal. Sinuses/Orbits:No paranasal sinus fluid levels or advanced mucosal thickening. No mastoid or middle ear effusion. Normal orbits. IMPRESSION: Normal brain MRI. Electronically Signed   By: Ulyses Jarred  M.D.   On: 06/09/2021 23:58   DG Chest Portable 1 View  Result Date: 06/09/2021 CLINICAL DATA:  Shortness of breath. EXAM: PORTABLE CHEST 1 VIEW COMPARISON:  04/22/2020. FINDINGS: The heart is enlarged atherosclerotic calcification of the aorta is noted. The pulmonary vasculature is mildly distended. No consolidation, effusion, or pneumothorax. A nodular density is again seen in the right upper lobe, question granuloma. No acute osseous abnormality. There has been interval placement of a right chest port with the tip terminating over the right atrium. Surgical clips are present in the right axilla. IMPRESSION: 1. Cardiomegaly with mildly distended pulmonary vasculature. 2. Nodular density over the right upper lobe, not significantly changed from the prior exam and may represent a granuloma versus nodule. CT should be considered for further evaluation on a nonemergent basis. Electronically Signed   By: Brett Fairy M.D.   On: 06/09/2021 20:25      Subjective: No dizziness. No sob or cp  Discharge Exam: Vitals:   06/10/21 1000 06/10/21 1030  BP: 108/67 (!) 97/54  Pulse: 87 79  Resp: 16 16  Temp:    SpO2: 100% 100%   Vitals:   06/10/21 0900 06/10/21 0930 06/10/21 1000 06/10/21 1030  BP: (!) 106/54 118/64 108/67 (!) 97/54  Pulse: 87 88 87 79  Resp: 15 19 16 16   Temp:      TempSrc:      SpO2: 97% 100% 100% 100%  Weight:      Height:        General: Pt is alert, awake, not in acute distress Cardiovascular: RRR, S1/S2 +, no rubs, no gallops Respiratory: CTA bilaterally, no wheezing, no rhonchi Abdominal: Soft, NT, ND, bowel sounds + Extremities: no edema, no cyanosis    The results of significant diagnostics from this hospitalization (including imaging, microbiology, ancillary and laboratory) are listed below for reference.     Microbiology: Recent Results (from the past 240 hour(s))  Resp Panel by RT-PCR (Flu A&B, Covid) Nasopharyngeal Swab     Status: None   Collection Time:  06/10/21  2:52 AM   Specimen: Nasopharyngeal Swab; Nasopharyngeal(NP) swabs in vial transport medium  Result Value Ref Range Status   SARS Coronavirus 2 by RT PCR NEGATIVE NEGATIVE Final    Comment: (NOTE) SARS-CoV-2 target nucleic acids are NOT DETECTED.  The SARS-CoV-2 RNA is generally detectable in upper respiratory specimens during the acute phase of infection. The lowest concentration of SARS-CoV-2 viral copies this assay can detect is 138 copies/mL. A negative result does not preclude SARS-Cov-2 infection and should not be used as the sole basis for treatment or other patient management decisions. A negative result may occur with  improper specimen collection/handling, submission of specimen other than nasopharyngeal swab, presence of viral mutation(s) within the areas targeted by this assay, and inadequate number of viral copies(<138 copies/mL). A negative result must be combined with clinical observations, patient history, and epidemiological information. The  expected result is Negative.  Fact Sheet for Patients:  EntrepreneurPulse.com.au  Fact Sheet for Healthcare Providers:  IncredibleEmployment.be  This test is no t yet approved or cleared by the [place] FDA and  has been authorized for detection and/or diagnosis of SARS-CoV-2 by FDA under an Emergency Use Authorization (EUA). This EUA will remain  in effect (meaning this test can be used) for the duration of the COVID-19 declaration under Section 564(b)(1) of the Act, 21 U.S.C.section 360bbb-3(b)(1), unless the authorization is terminated  or revoked sooner.       Influenza A by PCR NEGATIVE NEGATIVE Final   Influenza B by PCR NEGATIVE NEGATIVE Final    Comment: (NOTE) The Xpert Xpress SARS-CoV-2/FLU/RSV plus assay is intended as an aid in the diagnosis of influenza from Nasopharyngeal swab specimens and should not be used as a sole basis for treatment. Nasal washings  and aspirates are unacceptable for Xpert Xpress SARS-CoV-2/FLU/RSV testing.  Fact Sheet for Patients: EntrepreneurPulse.com.au  Fact Sheet for Healthcare Providers: IncredibleEmployment.be  This test is not yet approved or cleared by the [place] FDA and has been authorized for detection and/or diagnosis of SARS-CoV-2 by FDA under an Emergency Use Authorization (EUA). This EUA will remain in effect (meaning this test can be used) for the duration of the COVID-19 declaration under Section 564(b)(1) of the Act, 21 U.S.C. section 360bbb-3(b)(1), unless the authorization is terminated or revoked.  Performed at [facility], [place] [address] [place], [place] [address]      Labs: BNP (last 3 results) Recent Labs    06/10/21 0208  BNP 604.5*   Basic Metabolic Panel: Recent Labs  Lab 06/09/21 1954 06/10/21 0932  NA 138  --   K 3.2* 3.8  CL 101  --   CO2 28  --   GLUCOSE 70  --   BUN 5*  --   CREATININE 1.01*  --   CALCIUM 8.2*  --   MG 1.3* 2.0   Liver Function Tests: Recent Labs  Lab 06/09/21 1954  AST 20  ALT 10  ALKPHOS 40  BILITOT 0.5  PROT 6.0*  ALBUMIN 2.6*   No results for input(s): LIPASE, AMYLASE in the last 168 hours. No results for input(s): AMMONIA in the last 168 hours. CBC: Recent Labs  Lab 06/09/21 1954  WBC 10.0  NEUTROABS 5.6  HGB 11.3*  HCT 36.4  MCV 98.4  PLT 287   Cardiac Enzymes: No results for input(s): CKTOTAL, CKMB, CKMBINDEX, TROPONINI in the last 168 hours. BNP: Invalid input(s): POCBNP CBG: Recent Labs  Lab 06/09/21 2014 06/10/21 0031 06/10/21 0124  GLUCAP 70 63* 79   D-Dimer No results for input(s): DDIMER in the last 72 hours. Hgb A1c No results for input(s): HGBA1C in the last 72 hours. Lipid Profile No results for input(s): CHOL, HDL, LDLCALC, TRIG, CHOLHDL, LDLDIRECT in the last 72 hours. Thyroid function studies No results for input(s): TSH, T4TOTAL, T3FREE,  THYROIDAB in the last 72 hours.  Invalid input(s): FREET3 Anemia work up No results for input(s): VITAMINB12, FOLATE, FERRITIN, TIBC, IRON, RETICCTPCT in the last 72 hours. Urinalysis    Component Value Date/Time   COLORURINE STRAW (A) 06/09/2021 2314   APPEARANCEUR CLEAR 06/09/2021 2314   LABSPEC 1.004 (L) 06/09/2021 2314   PHURINE 5.0 06/09/2021 2314   GLUCOSEU NEGATIVE 06/09/2021 2314   HGBUR NEGATIVE 06/09/2021 2314   BILIRUBINUR NEGATIVE 06/09/2021 2314   KETONESUR NEGATIVE 06/09/2021 2314   PROTEINUR NEGATIVE 06/09/2021 2314   NITRITE  NEGATIVE 06/09/2021 2314   LEUKOCYTESUR NEGATIVE 06/09/2021 2314   Sepsis Labs Invalid input(s): PROCALCITONIN,  WBC,  LACTICIDVEN Microbiology Recent Results (from the past 240 hour(s))  Resp Panel by RT-PCR (Flu A&B, Covid) Nasopharyngeal Swab     Status: None   Collection Time: 06/10/21  2:52 AM   Specimen: Nasopharyngeal Swab; Nasopharyngeal(NP) swabs in vial transport medium  Result Value Ref Range Status   SARS Coronavirus 2 by RT PCR NEGATIVE NEGATIVE Final    Comment: (NOTE) SARS-CoV-2 target nucleic acids are NOT DETECTED.  The SARS-CoV-2 RNA is generally detectable in upper respiratory specimens during the acute phase of infection. The lowest concentration of SARS-CoV-2 viral copies this assay can detect is 138 copies/mL. A negative result does not preclude SARS-Cov-2 infection and should not be used as the sole basis for treatment or other patient management decisions. A negative result may occur with  improper specimen collection/handling, submission of specimen other than nasopharyngeal swab, presence of viral mutation(s) within the areas targeted by this assay, and inadequate number of viral copies(<138 copies/mL). A negative result must be combined with clinical observations, patient history, and epidemiological information. The expected result is Negative.  Fact Sheet for Patients:   EntrepreneurPulse.com.au  Fact Sheet for Healthcare Providers:  IncredibleEmployment.be  This test is no t yet approved or cleared by the [place] FDA and  has been authorized for detection and/or diagnosis of SARS-CoV-2 by FDA under an Emergency Use Authorization (EUA). This EUA will remain  in effect (meaning this test can be used) for the duration of the COVID-19 declaration under Section 564(b)(1) of the Act, 21 U.S.C.section 360bbb-3(b)(1), unless the authorization is terminated  or revoked sooner.       Influenza A by PCR NEGATIVE NEGATIVE Final   Influenza B by PCR NEGATIVE NEGATIVE Final    Comment: (NOTE) The Xpert Xpress SARS-CoV-2/FLU/RSV plus assay is intended as an aid in the diagnosis of influenza from Nasopharyngeal swab specimens and should not be used as a sole basis for treatment. Nasal washings and aspirates are unacceptable for Xpert Xpress SARS-CoV-2/FLU/RSV testing.  Fact Sheet for Patients: EntrepreneurPulse.com.au  Fact Sheet for Healthcare Providers: IncredibleEmployment.be  This test is not yet approved or cleared by the [place] FDA and has been authorized for detection and/or diagnosis of SARS-CoV-2 by FDA under an Emergency Use Authorization (EUA). This EUA will remain in effect (meaning this test can be used) for the duration of the COVID-19 declaration under Section 564(b)(1) of the Act, 21 U.S.C. section 360bbb-3(b)(1), unless the authorization is terminated or revoked.  Performed at [facility], [place] [address], [place], [place] [address]      Time coordinating discharge: Over 30 minutes  SIGNED:   Nolberto Hanlon, MD  [facility] 06/10/2021, 10:51 AM Pager   If 7PM-7AM, please contact night-coverage [email or web address] Password [email or web address]

## 2021-06-10 NOTE — ED Notes (Signed)
Attempted getting in contact w daughter. Daughter did not answer.  ?

## 2021-06-10 NOTE — Assessment & Plan Note (Signed)
-   We will continue statin therapy. 

## 2021-07-05 ENCOUNTER — Emergency Department (HOSPITAL_COMMUNITY)
Admission: EM | Admit: 2021-07-05 | Discharge: 2021-07-05 | Disposition: A | Discharge status: Home / Self Care | Payer: Medicare HMO | Attending: Emergency Medicine | Admitting: Emergency Medicine

## 2021-07-05 ENCOUNTER — Other Ambulatory Visit: Payer: Self-pay

## 2021-07-05 ENCOUNTER — Emergency Department (HOSPITAL_COMMUNITY): Payer: Medicare HMO

## 2021-07-05 ENCOUNTER — Encounter (HOSPITAL_COMMUNITY): Payer: Self-pay

## 2021-07-05 DIAGNOSIS — E11649 Type 2 diabetes mellitus with hypoglycemia without coma: Secondary | ICD-10-CM | POA: Diagnosis not present

## 2021-07-05 DIAGNOSIS — I10 Essential (primary) hypertension: Secondary | ICD-10-CM | POA: Insufficient documentation

## 2021-07-05 DIAGNOSIS — C7800 Secondary malignant neoplasm of unspecified lung: Secondary | ICD-10-CM | POA: Insufficient documentation

## 2021-07-05 DIAGNOSIS — J45909 Unspecified asthma, uncomplicated: Secondary | ICD-10-CM | POA: Insufficient documentation

## 2021-07-05 DIAGNOSIS — R600 Localized edema: Secondary | ICD-10-CM | POA: Insufficient documentation

## 2021-07-05 DIAGNOSIS — Z79899 Other long term (current) drug therapy: Secondary | ICD-10-CM | POA: Insufficient documentation

## 2021-07-05 DIAGNOSIS — Z7951 Long term (current) use of inhaled steroids: Secondary | ICD-10-CM | POA: Diagnosis not present

## 2021-07-05 DIAGNOSIS — I959 Hypotension, unspecified: Secondary | ICD-10-CM | POA: Diagnosis not present

## 2021-07-05 DIAGNOSIS — R531 Weakness: Secondary | ICD-10-CM | POA: Insufficient documentation

## 2021-07-05 DIAGNOSIS — Z85118 Personal history of other malignant neoplasm of bronchus and lung: Secondary | ICD-10-CM | POA: Insufficient documentation

## 2021-07-05 DIAGNOSIS — R0602 Shortness of breath: Secondary | ICD-10-CM | POA: Diagnosis not present

## 2021-07-05 DIAGNOSIS — E162 Hypoglycemia, unspecified: Secondary | ICD-10-CM | POA: Diagnosis present

## 2021-07-05 DIAGNOSIS — G8191 Hemiplegia, unspecified affecting right dominant side: Secondary | ICD-10-CM | POA: Diagnosis present

## 2021-07-05 LAB — CBC WITH DIFFERENTIAL/PLATELET
Abs Immature Granulocytes: 0.02 10*3/uL (ref 0.00–0.07)
Basophils Absolute: 0 10*3/uL (ref 0.0–0.1)
Basophils Relative: 1 %
Eosinophils Absolute: 0 10*3/uL (ref 0.0–0.5)
Eosinophils Relative: 1 %
HCT: 34.3 % — ABNORMAL LOW (ref 36.0–46.0)
Hemoglobin: 10.8 g/dL — ABNORMAL LOW (ref 12.0–15.0)
Immature Granulocytes: 0 %
Lymphocytes Relative: 25 %
Lymphs Abs: 1.6 10*3/uL (ref 0.7–4.0)
MCH: 31.7 pg (ref 26.0–34.0)
MCHC: 31.5 g/dL (ref 30.0–36.0)
MCV: 100.6 fL — ABNORMAL HIGH (ref 80.0–100.0)
Monocytes Absolute: 0.5 10*3/uL (ref 0.1–1.0)
Monocytes Relative: 7 %
Neutro Abs: 4.3 10*3/uL (ref 1.7–7.7)
Neutrophils Relative %: 66 %
Platelets: 241 10*3/uL (ref 150–400)
RBC: 3.41 MIL/uL — ABNORMAL LOW (ref 3.87–5.11)
RDW: 13.4 % (ref 11.5–15.5)
WBC: 6.5 10*3/uL (ref 4.0–10.5)
nRBC: 0 % (ref 0.0–0.2)

## 2021-07-05 LAB — URINALYSIS, ROUTINE W REFLEX MICROSCOPIC
Bilirubin Urine: NEGATIVE
Glucose, UA: NEGATIVE mg/dL
Hgb urine dipstick: NEGATIVE
Ketones, ur: NEGATIVE mg/dL
Leukocytes,Ua: NEGATIVE
Nitrite: NEGATIVE
Protein, ur: NEGATIVE mg/dL
Specific Gravity, Urine: 1.003 — ABNORMAL LOW (ref 1.005–1.030)
pH: 5 (ref 5.0–8.0)

## 2021-07-05 LAB — CBG MONITORING, ED
Glucose-Capillary: 119 mg/dL — ABNORMAL HIGH (ref 70–99)
Glucose-Capillary: 92 mg/dL (ref 70–99)

## 2021-07-05 LAB — COMPREHENSIVE METABOLIC PANEL
ALT: 22 U/L (ref 0–44)
AST: 22 U/L (ref 15–41)
Albumin: 2.3 g/dL — ABNORMAL LOW (ref 3.5–5.0)
Alkaline Phosphatase: 38 U/L (ref 38–126)
Anion gap: 7 (ref 5–15)
BUN: 11 mg/dL (ref 8–23)
CO2: 26 mmol/L (ref 22–32)
Calcium: 7.9 mg/dL — ABNORMAL LOW (ref 8.9–10.3)
Chloride: 103 mmol/L (ref 98–111)
Creatinine, Ser: 0.95 mg/dL (ref 0.44–1.00)
GFR, Estimated: >60 mL/min (ref >60)
Glucose, Bld: 190 mg/dL — ABNORMAL HIGH (ref 70–99)
Potassium: 3 mmol/L — ABNORMAL LOW (ref 3.5–5.1)
Sodium: 136 mmol/L (ref 135–145)
Total Bilirubin: 0.4 mg/dL (ref 0.3–1.2)
Total Protein: 5.3 g/dL — ABNORMAL LOW (ref 6.5–8.1)

## 2021-07-05 LAB — TROPONIN I (HIGH SENSITIVITY)
Troponin I (High Sensitivity): 13 ng/L (ref <18)
Troponin I (High Sensitivity): 16 ng/L (ref <18)

## 2021-07-05 LAB — BRAIN NATRIURETIC PEPTIDE: B Natriuretic Peptide: 258.3 pg/mL — ABNORMAL HIGH (ref 0.0–100.0)

## 2021-07-05 MED ORDER — MIDODRINE HCL 5 MG PO TABS: 5.0000 mg | ORAL_TABLET | Freq: Three times a day (TID) | ORAL | 0 refills | Status: DC

## 2021-07-05 MED ORDER — MIDODRINE HCL 5 MG PO TABS
5.0000 mg | ORAL_TABLET | Freq: Once | ORAL | Status: AC
  Administered 2021-07-05: 5 mg via ORAL
  Filled 2021-07-05: qty 1

## 2021-07-05 NOTE — ED Provider Notes (Signed)
Pt signed out by Dr. Maryan Rued.  Pt has received Midodrine and bp is better.  BS is also better and is stable.  She is able to ambulate with her walker without any additional assistance.  No weakness.  She is encouraged to stop the Metformin and she is given a new rx for Midodrine.  She is not sure where the one given at d/c went.  Pt is stable for d/c.  She is to f/u with pcp.  Return if worse.  ?  ?Charlene Pence, MD ?07/05/21 1754 ? ?

## 2021-07-05 NOTE — ED Triage Notes (Signed)
Pt arrived via [facility] from home for c/o decreased use of right arm and leg upon waking this morning. Pt's LKW 0000 this morning. Per EMS, pt's CBG was 50. EMS gave oral glucose. EMS stated they tried to give fruit snacks, but pt couldn't swallow them. EMS states they gave D10 25g and last glucose re-check for them was 125. Pt is A&Ox4. VSS. ?

## 2021-07-05 NOTE — Discharge Instructions (Addendum)
?  Medication List  ?   ?  ?STOP taking these medications   ?  ?furosemide 20 MG tablet ?Commonly known as: LASIX ?   ?hydrochlorothiazide 12.5 MG capsule ?Commonly known as: MICROZIDE ?   ?lisinopril 20 MG tablet ?Commonly known as: ZESTRIL ?   ?metFORMIN 500 MG tablet ?Commonly known as: GLUCOPHAGE ?   ?potassium chloride SA 20 MEQ tablet ?Commonly known as: KLOR-CON M ?   ?pravastatin 20 MG tablet ?Commonly known as: PRAVACHOL ?   ?predniSONE 10 MG tablet ?Commonly known as: DELTASONE  ? ?

## 2021-07-05 NOTE — ED Provider Notes (Signed)
?[facility] ?Provider Note ? ? ?CSN: [id number] ?Arrival date & time: 07/05/21  1220 ? ?  ? ?History ? ?Chief Complaint  ?Patient presents with  ? Hypoglycemia  ? ? ?Charlene Houston is a 76 y.o. female. ? 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  Patient reports yesterday was a normal day.  When she went to bed she felt fine but when she woke up around 9:00 this morning she was very weak and reports she could not move her right arm or leg.  She does live with her son but he is a Administrator and he was not present there this morning.  It took her over an hour to be able to call her daughter because she reported her right arm would not work.  When EMS arrived they noted right-sided deficits.  Her blood sugar was 50 and they initially gave her some glucose but she was having a difficult time swallowing or chewing any fruit snacks.  They gave her 25 g of D10 and patient reports her symptoms are improving.  She feels like there is no further weakness in her arm but her right leg still does feel weak.  She reports chronic shortness of breath but thinks it is a little bit worse this morning.  She denies any new cough, chest pain or congestion.  She denies nausea, vomiting or abdominal pain.  She took all of her medications regularly yesterday.  She does not use any injectable insulins.  She reports she ate yesterday but she did not eat before going to bed.  She has not had any recent urinary symptoms and denies any diarrhea. ? ?The history is provided by the patient and the EMS  personnel.  ?Hypoglycemia ? ?  ? ?Home Medications ?Prior to Admission medications   ?Medication Sig Start Date End Date Taking? Authorizing Provider  ?acetaminophen (TYLENOL) 325 MG tablet Take 650 mg by mouth every 6 (six) hours as needed for mild pain or headache.    [provider]  ?albuterol (PROVENTIL HFA;VENTOLIN HFA) 108 (90 Base) MCG/ACT inhaler Inhale 1-2 puffs into the lungs every 6 (six) hours as needed for wheezing or shortness of breath.    [provider]  ?desonide (DESOWEN) 0.05 % cream Apply topically 2 (two) times daily. 09/03/20   Raspet, Derry Skill, PA-C  ?fluticasone (FLONASE) 50 MCG/ACT nasal spray Place 1 spray into both nostrils as needed for allergies or rhinitis.     [provider]  ?folic acid (FOLVITE) 1 MG tablet Take 1 tablet (1 mg total) by mouth daily. 06/11/21 07/11/21  Nolberto Hanlon, MD  ?gabapentin (NEURONTIN) 300 MG capsule Take 300 mg by mouth 2 (two) times daily. Taking twice daily 10/26/17   [provider]  ?HYDROcodone-acetaminophen (NORCO/VICODIN) 5-325 MG tablet Take 1-2 tablets by mouth every 6 (six) hours as needed. 02/17/20   Davonna Belling, MD  ?ipratropium-albuterol (DUONEB) 0.5-2.5 (3) MG/3ML SOLN Take 3 mLs by nebulization 2 (two) times daily. 07/11/18   Regalado, Belkys A, MD  ?mirtazapine (REMERON) 15 MG tablet Take 1 tablet (15 mg total) by mouth at bedtime. 04/23/19   Robyn Haber, MD  ?  mometasone-formoterol (DULERA) 200-5 MCG/ACT AERO Inhale 2 puffs into the lungs 2 (two) times daily. 07/11/18   Regalado, Belkys A, MD  ?pantoprazole (PROTONIX) 40 MG tablet Take 1 tablet (40 mg total) by mouth at bedtime. 07/11/18   Regalado, Belkys A, MD  ?simvastatin (ZOCOR) 40 MG tablet Take 40 mg by mouth at bedtime. 03/10/20   [provider]  ?triamcinolone cream (KENALOG) 0.5 % Apply 1 application topically 3 (three) times daily. 10/06/17   [provider]  ?Vitamin D, Ergocalciferol, (DRISDOL) 1.25 MG (50000 UNIT) CAPS  capsule Take 50,000 Units by mouth every Sunday. 04/17/20   [provider]  ?escitalopram (LEXAPRO) 5 MG tablet Take 5 mg by mouth daily. 06/15/18 04/23/19  [provider]  ?   ? ?Allergies    ?Prozac [fluoxetine hcl] and Vit d-vit e-safflower oil   ? ?Review of Systems   ?Review of Systems ? ?Physical Exam ?Updated Vital Signs ?BP (!) 99/56   Pulse 84   Temp 97.7 ?F (36.5 ?C) (Oral)   Resp 16   Ht 5\' 5"  (1.651 m)   Wt 92.1 kg   SpO2 94%   BMI 33.79 kg/m?  ?Physical Exam ?Vitals and nursing note reviewed.  ?Constitutional:   ?   General: She is not in acute distress. ?   Appearance: She is well-developed.  ?HENT:  ?   Head: Normocephalic and atraumatic.  ?Eyes:  ?   Pupils: Pupils are equal, round, and reactive to light.  ?Cardiovascular:  ?   Rate and Rhythm: Normal rate and regular rhythm.  ?   Heart sounds: Normal heart sounds. No murmur heard. ?  No friction rub.  ?Pulmonary:  ?   Effort: Pulmonary effort is normal.  ?   Breath sounds: Normal breath sounds. Decreased air movement present. No wheezing or rales.  ?   Comments: Decreased breath sounds in the lower lobes.  Patient is able to sit up spontaneously ?Abdominal:  ?   General: Bowel sounds are normal. There is no distension.  ?   Palpations: Abdomen is soft.  ?   Tenderness: There is no abdominal tenderness. There is no guarding or rebound.  ?Musculoskeletal:     ?   General: No tenderness. Normal range of motion.  ?   Right lower leg: Edema present.  ?   Left lower leg: Edema present.  ?   Comments: 1+ pitting edema in bilateral ankles and feet  ?Skin: ?   General: Skin is warm and dry.  ?   Findings: No rash.  ?Neurological:  ?   Mental Status: She is alert and oriented to person, place, and time.  ?   Cranial Nerves: No cranial nerve deficit.  ?   Motor: Weakness present.  ?   Comments: 5 out of 5 strength in bilateral upper extremities without pronator drift.  5 out of 5 strength in the left lower extremity.  Unable to lift the  right leg off the bed but can flex and extend the right foot.  Sensation is intact.  No notable facial droops.  No aphasia.  No visual field cuts or nystagmus.  ?Psychiatric:     ?   Behavior: Behavior normal.  ? ? ?ED Results / Procedures / Treatments   ?Labs ?(all labs ordered are listed, but only abnormal results are displayed) ?Labs Reviewed  ?CBC WITH DIFFERENTIAL/PLATELET - Abnormal; Notable for the following components:  ?    Result Value  ? RBC 3.41 (*)   ?  Hemoglobin 10.8 (*)   ? HCT 34.3 (*)   ? MCV 100.6 (*)   ? All other components within normal limits  ?COMPREHENSIVE METABOLIC PANEL - Abnormal; Notable for the following components:  ? Potassium 3.0 (*)   ? Glucose, Bld 190 (*)   ? Calcium 7.9 (*)   ? Total Protein 5.3 (*)   ? Albumin 2.3 (*)   ? All other components within normal limits  ?URINALYSIS, ROUTINE W REFLEX MICROSCOPIC - Abnormal; Notable for the following components:  ? Color, Urine STRAW (*)   ? Specific Gravity, Urine 1.003 (*)   ? All other components within normal limits  ?BRAIN NATRIURETIC PEPTIDE - Abnormal; Notable for the following components:  ? B Natriuretic Peptide 258.3 (*)   ? All other components within normal limits  ?CBG MONITORING, ED - Abnormal; Notable for the following components:  ? Glucose-Capillary 119 (*)   ? All other components within normal limits  ?CBG MONITORING, ED  ?TROPONIN I (HIGH SENSITIVITY)  ?TROPONIN I (HIGH SENSITIVITY)  ? ? ?EKG ?EKG Interpretation ? ?Date/Time:  Sunday July 05 2021 12:45:39 EDT ?Ventricular Rate:  84 ?PR Interval:  154 ?QRS Duration: 172 ?QT Interval:  472 ?QTC Calculation: 558 ?R Axis:   -75 ?Text Interpretation: Sinus rhythm Left bundle branch block No significant change since last tracing Confirmed by Blanchie Dessert [phone]) on 07/05/2021 1:21:05 PM ? ?Radiology ?CT Head Wo Contrast ? ?Result Date: 07/05/2021 ?CLINICAL DATA:  Neuro deficit EXAM: CT HEAD WITHOUT CONTRAST TECHNIQUE: Contiguous axial images were obtained from the base  of the skull through the vertex without intravenous contrast. RADIATION DOSE REDUCTION: This exam was performed according to the departmental dose-optimization program which includes automated exposure control, adjustmen

## 2021-07-16 ENCOUNTER — Ambulatory Visit (HOSPITAL_COMMUNITY)
Admission: EM | Admit: 2021-07-16 | Discharge: 2021-07-16 | Disposition: A | Discharge status: Home / Self Care | Payer: Medicare HMO | Attending: Internal Medicine | Admitting: Internal Medicine

## 2021-07-16 ENCOUNTER — Other Ambulatory Visit: Payer: Self-pay

## 2021-07-16 ENCOUNTER — Ambulatory Visit (INDEPENDENT_AMBULATORY_CARE_PROVIDER_SITE_OTHER): Payer: Medicare HMO

## 2021-07-16 ENCOUNTER — Encounter (HOSPITAL_COMMUNITY): Payer: Self-pay | Admitting: Emergency Medicine

## 2021-07-16 DIAGNOSIS — J189 Pneumonia, unspecified organism: Secondary | ICD-10-CM | POA: Insufficient documentation

## 2021-07-16 DIAGNOSIS — R0602 Shortness of breath: Secondary | ICD-10-CM

## 2021-07-16 DIAGNOSIS — R601 Generalized edema: Secondary | ICD-10-CM | POA: Insufficient documentation

## 2021-07-16 LAB — BASIC METABOLIC PANEL
Anion gap: 3 — ABNORMAL LOW (ref 5–15)
BUN: 5 mg/dL — ABNORMAL LOW (ref 8–23)
CO2: 32 mmol/L (ref 22–32)
Calcium: 8.9 mg/dL (ref 8.9–10.3)
Chloride: 107 mmol/L (ref 98–111)
Creatinine, Ser: 0.96 mg/dL (ref 0.44–1.00)
GFR, Estimated: >60 mL/min (ref >60)
Glucose, Bld: 142 mg/dL — ABNORMAL HIGH (ref 70–99)
Potassium: 4.9 mmol/L (ref 3.5–5.1)
Sodium: 142 mmol/L (ref 135–145)

## 2021-07-16 LAB — CBC
HCT: 39.3 % (ref 36.0–46.0)
Hemoglobin: 12.4 g/dL (ref 12.0–15.0)
MCH: 31.6 pg (ref 26.0–34.0)
MCHC: 31.6 g/dL (ref 30.0–36.0)
MCV: 100.3 fL — ABNORMAL HIGH (ref 80.0–100.0)
Platelets: 293 10*3/uL (ref 150–400)
RBC: 3.92 MIL/uL (ref 3.87–5.11)
RDW: 14.4 % (ref 11.5–15.5)
WBC: 10.8 10*3/uL — ABNORMAL HIGH (ref 4.0–10.5)
nRBC: 0 % (ref 0.0–0.2)

## 2021-07-16 MED ORDER — AMOXICILLIN-POT CLAVULANATE 875-125 MG PO TABS: 1.0000 | ORAL_TABLET | Freq: Two times a day (BID) | ORAL | 0 refills | Status: AC

## 2021-07-16 MED ORDER — AZITHROMYCIN 250 MG PO TABS: ORAL_TABLET | ORAL | 0 refills | Status: DC

## 2021-07-16 MED ORDER — FUROSEMIDE 20 MG PO TABS: 20.0000 mg | ORAL_TABLET | Freq: Every day | ORAL | 0 refills | Status: DC

## 2021-07-16 MED ORDER — LOSARTAN POTASSIUM 25 MG PO TABS: 25.0000 mg | ORAL_TABLET | Freq: Every day | ORAL | 0 refills | Status: DC

## 2021-07-16 NOTE — ED Provider Notes (Addendum)
?[facility] ? ? ? ?CSN: [id number] ?Arrival date & time: 07/16/21  1250 ? ? ?  ? ?History   ?Chief Complaint ?Chief Complaint  ?Patient presents with  ? Edema  ? ? ?HPI ?Charlene Houston is a 76 y.o. female comes to the urgent care for worsening generalized edema.  Patient was recently seen in the emergency department for hypoglycemia and hypotension.  Patient's hydrochlorothiazide was discontinued.  She has some shortness of breath with exertion but denies any orthopnea or paroxysmal nocturnal dyspnea.  No changes in diet or increase salt intake.  No chest pain or chest pressure.  No nausea or vomiting.  No dizziness, near syncope or syncopal episodes.  Patient does not know her dry weight.  She denies any history of congestive heart failure.  ? ?HPI ? ?Past Medical History:  ?Diagnosis Date  ? Arthritis   ? Asthma   ? Breast cancer ([place])   ? Diabetes mellitus without complication ([place])   ? Hypertension   ? SBO (small bowel obstruction) ([place]) 06/2018  ? ? ?Patient Active Problem List  ? Diagnosis Date Noted  ? Dizziness 06/10/2021  ? Acute respiratory failure with hypoxia ([place]) 06/10/2021  ? Hypokalemia 06/10/2021  ? Hypomagnesemia 06/10/2021  ? GERD (gastroesophageal reflux disease) 06/10/2021  ? Non-small cell lung cancer ([place]) 06/10/2021  ? COVID-19 virus infection 04/23/2020  ? Hypoglycemia 04/22/2020  ? Rhabdomyolysis 04/22/2020  ? Essential hypertension 04/22/2020  ? HLD (hyperlipidemia) 04/22/2020  ? Sleep apnea 04/22/2020  ? Diabetes mellitus type 2 in nonobese ([place]) 04/22/2020  ? SBO (small bowel obstruction) ([place]) 07/06/2018  ? ? ?Past Surgical History:  ?Procedure Laterality Date  ? ABDOMINAL HYSTERECTOMY    ? THUMB AMPUTATION Left   ? PARTIAL  ? TONSILLECTOMY    ? ? ?OB History   ?No obstetric history on file. ?  ? ? ? ?Home Medications   ? ?Prior to Admission medications   ?Medication Sig Start Date End Date Taking? Authorizing Provider  ?amoxicillin-clavulanate (AUGMENTIN) 875-125 MG  tablet Take 1 tablet by mouth every 12 (twelve) hours for 5 days. 07/16/21 07/21/21 Yes Dinari Stgermaine, Myrene Galas, MD  ?azithromycin (ZITHROMAX) 250 MG tablet Take first 2 tablets together, then 1 every day until finished. 07/16/21  Yes Ala Kratz, Myrene Galas, MD  ?furosemide (LASIX) 20 MG tablet Take 1 tablet (20 mg total) by mouth daily. 07/16/21 08/15/21 Yes Icie Kuznicki, Myrene Galas, MD  ?acetaminophen (TYLENOL) 325 MG tablet Take 650 mg by mouth every 6 (six) hours as needed for mild pain or headache.    [provider]  ?albuterol (ACCUNEB) 1.25 MG/3ML nebulizer solution Take 3 mLs by nebulization every 6 (six) hours as needed for wheezing. 01/16/21   [provider]  ?albuterol (PROVENTIL HFA;VENTOLIN HFA) 108 (90 Base) MCG/ACT inhaler Inhale 1-2 puffs into the lungs every 6 (six) hours as needed for wheezing or shortness of breath.    [provider]  ?carboxymethylcellulose (REFRESH PLUS) 0.5 % SOLN Place 1 drop into both eyes daily as needed (dry eyes).    [provider]  ?cyanocobalamin (,VITAMIN B-12,) 1000 MCG/ML injection Inject 1,000 mcg into the muscle every 30 (thirty) days.    [provider]  ?desonide (DESOWEN) 0.05 % cream Apply topically 2 (two) times daily. 09/03/20   Raspet, Derry Skill, PA-C  ?Ergocalciferol 10 MCG (400 UNIT) TABS Take 800-1,200 Units by mouth See admin instructions. Taking 2 tabs ( 800 units) on Sunday and 3 tabs (1200) units daily except Sunday. 12/23/20  [provider]  ?fluticasone (FLONASE) 50 MCG/ACT nasal spray Place 1 spray into both nostrils as needed for allergies or rhinitis.     [provider]  ?gabapentin (NEURONTIN) 300 MG capsule Take 300 mg by mouth 2 (two) times daily. Taking twice daily 10/26/17   [provider]  ?losartan (COZAAR) 25 MG tablet Take 1 tablet (25 mg total) by mouth daily. 07/16/21   Antoinette Haskett, Myrene Galas, MD  ?midodrine (PROAMATINE) 5 MG tablet Take 1 tablet (5 mg total) by mouth 3 (three) times daily with  meals. 07/05/21   Isla Pence, MD  ?mirtazapine (REMERON) 15 MG tablet Take 1 tablet (15 mg total) by mouth at bedtime. 04/23/19   Robyn Haber, MD  ?montelukast (SINGULAIR) 10 MG tablet Take 10 mg by mouth daily. 04/28/21   [provider]  ?potassium chloride SA (KLOR-CON M) 20 MEQ tablet Take 20 mEq by mouth daily. 05/04/21   [provider]  ?simvastatin (ZOCOR) 40 MG tablet Take 40 mg by mouth at bedtime. 03/10/20   [provider]  ?triamcinolone cream (KENALOG) 0.5 % Apply 1 application topically 3 (three) times daily. 10/06/17   [provider]  ?escitalopram (LEXAPRO) 5 MG tablet Take 5 mg by mouth daily. 06/15/18 04/23/19  [provider]  ? ? ?Family History ?Family History  ?Problem Relation Age of Onset  ? Hypertension Mother   ? Diabetes Mother   ? Cancer Father   ? ? ?Social History ?Social History  ? ?Tobacco Use  ? Smoking status: Former  ?  Types: Cigarettes  ?  Quit date: 1995  ?  Years since quitting: 28.2  ? Smokeless tobacco: Never  ?Vaping Use  ? Vaping Use: Never used  ?Substance Use Topics  ? Alcohol use: Not Currently  ? Drug use: Not Currently  ? ? ? ?Allergies   ?Prozac [fluoxetine hcl] and Vit d-vit e-safflower oil ? ? ?Review of Systems ?Review of Systems  ?Constitutional: Negative.  Negative for activity change and appetite change.  ?HENT: Negative.    ?Respiratory:  Positive for cough and shortness of breath. Negative for chest tightness and wheezing.   ?Cardiovascular: Negative.   ?Gastrointestinal: Negative.   ?Skin: Negative.   ?Neurological: Negative.   ? ? ?Physical Exam ?Triage Vital Signs ?ED Triage Vitals  ?Enc Vitals Group  ?   BP 07/16/21 1439 114/75  ?   Pulse Rate 07/16/21 1439 85  ?   Resp 07/16/21 1439 (!) 24  ?   Temp --   ?   Temp Source 07/16/21 1439 Oral  ?   SpO2 07/16/21 1439 94 %  ?   Weight --   ?   Height --   ?   Head Circumference --   ?   Peak Flow --   ?   Pain Score 07/16/21 1436 7  ?   Pain Loc --   ?   Pain  Edu? --   ?   Excl. in [place]? --   ? ?No data found. ? ?Updated Vital Signs ?BP 114/75 (BP Location: Left Arm) Comment (BP Location): large cuff  Pulse 85   Resp (!) 24   Wt 93.5 kg   SpO2 94%   BMI 34.31 kg/m?  ? ?Visual Acuity ?Right Eye Distance:   ?Left Eye Distance:   ?Bilateral Distance:   ? ?Right Eye Near:   ?Left Eye Near:    ?Bilateral Near:    ? ?Physical Exam ?Vitals and nursing note reviewed.  ?  Constitutional:   ?   General: She is not in acute distress. ?   Appearance: She is not ill-appearing.  ?Cardiovascular:  ?   Rate and Rhythm: Normal rate and regular rhythm.  ?   Pulses: Normal pulses.  ?   Heart sounds: Normal heart sounds. No murmur heard. ?  No friction rub.  ?Pulmonary:  ?   Effort: Pulmonary effort is normal.  ?   Breath sounds: Normal breath sounds. No wheezing or rhonchi.  ?Abdominal:  ?   General: Bowel sounds are normal.  ?   Palpations: Abdomen is soft.  ?Neurological:  ?   Mental Status: She is alert.  ? ? ? ?UC Treatments / Results  ?Labs ?(all labs ordered are listed, but only abnormal results are displayed) ?Labs Reviewed  ?CBC - Abnormal; Notable for the following components:  ?    Result Value  ? WBC 10.8 (*)   ? MCV 100.3 (*)   ? All other components within normal limits  ?BASIC METABOLIC PANEL - Abnormal; Notable for the following components:  ? Glucose, Bld 142 (*)   ? BUN 5 (*)   ? Anion gap 3 (*)   ? All other components within normal limits  ? ? ?EKG ? ? ?Radiology ?DG Chest 2 View ? ?Result Date: 07/16/2021 ?CLINICAL DATA:  Short of breath and lower leg swelling EXAM: CHEST - 2 VIEW COMPARISON:  Chest x-ray 07/05/2021 FINDINGS: Cardiac enlargement with normal vascularity. Negative for heart failure. Mild left lower lobe airspace disease similar to the prior study. Small left effusion. Right lung clear. Port-A-Cath tip in the right atrium unchanged. IMPRESSION: Cardiac enlargement without heart failure. Mild left lower lobe airspace disease and small left effusion. Possible  atelectasis or pneumonia. Electronically Signed   By: Franchot Gallo M.D.   On: 07/16/2021 16:39   ? ?Procedures ?Procedures (including critical care time) ? ?Medications Ordered in UC ?Medications - No data to

## 2021-07-16 NOTE — Discharge Instructions (Addendum)
Take medications as prescribed ?If you have worsening shortness of breath, fever or confusion please go to the emergency department to be evaluated further ?I would encourage you to do daily weights ?Decrease total daily fluid intake to less than 1800 mL in 24 hours ?We will call you with recommendations if labs are abnormal ?

## 2021-07-16 NOTE — ED Triage Notes (Signed)
Patient was seen in ED on 3/26.  Reports several medicines were changed.  Patient has swelling in lower extremities bilaterally and right hand for several days.  Edema is pitting.   ?

## 2022-01-01 ENCOUNTER — Other Ambulatory Visit: Payer: Self-pay

## 2022-01-01 ENCOUNTER — Emergency Department (HOSPITAL_COMMUNITY): Payer: Medicare HMO

## 2022-01-01 ENCOUNTER — Inpatient Hospital Stay (HOSPITAL_COMMUNITY)
Admission: EM | Admit: 2022-01-01 | Discharge: 2022-01-06 | DRG: 205 | Disposition: A | Discharge status: Home Health | Payer: Medicare HMO | Attending: Internal Medicine | Admitting: Internal Medicine

## 2022-01-01 DIAGNOSIS — Z8249 Family history of ischemic heart disease and other diseases of the circulatory system: Secondary | ICD-10-CM

## 2022-01-01 DIAGNOSIS — J984 Other disorders of lung: Secondary | ICD-10-CM

## 2022-01-01 DIAGNOSIS — J45909 Unspecified asthma, uncomplicated: Secondary | ICD-10-CM | POA: Diagnosis present

## 2022-01-01 DIAGNOSIS — I5041 Acute combined systolic (congestive) and diastolic (congestive) heart failure: Secondary | ICD-10-CM | POA: Diagnosis present

## 2022-01-01 DIAGNOSIS — C3492 Malignant neoplasm of unspecified part of left bronchus or lung: Secondary | ICD-10-CM | POA: Diagnosis present

## 2022-01-01 DIAGNOSIS — E119 Type 2 diabetes mellitus without complications: Secondary | ICD-10-CM

## 2022-01-01 DIAGNOSIS — J9601 Acute respiratory failure with hypoxia: Secondary | ICD-10-CM | POA: Diagnosis present

## 2022-01-01 DIAGNOSIS — C7801 Secondary malignant neoplasm of right lung: Secondary | ICD-10-CM | POA: Diagnosis present

## 2022-01-01 DIAGNOSIS — Z91018 Allergy to other foods: Secondary | ICD-10-CM

## 2022-01-01 DIAGNOSIS — J9691 Respiratory failure, unspecified with hypoxia: Secondary | ICD-10-CM | POA: Diagnosis present

## 2022-01-01 DIAGNOSIS — E876 Hypokalemia: Secondary | ICD-10-CM | POA: Diagnosis present

## 2022-01-01 DIAGNOSIS — I10 Essential (primary) hypertension: Secondary | ICD-10-CM | POA: Diagnosis not present

## 2022-01-01 DIAGNOSIS — I5021 Acute systolic (congestive) heart failure: Secondary | ICD-10-CM | POA: Diagnosis not present

## 2022-01-01 DIAGNOSIS — N179 Acute kidney failure, unspecified: Secondary | ICD-10-CM | POA: Diagnosis present

## 2022-01-01 DIAGNOSIS — E11649 Type 2 diabetes mellitus with hypoglycemia without coma: Secondary | ICD-10-CM | POA: Diagnosis present

## 2022-01-01 DIAGNOSIS — I110 Hypertensive heart disease with heart failure: Secondary | ICD-10-CM | POA: Diagnosis present

## 2022-01-01 DIAGNOSIS — E1165 Type 2 diabetes mellitus with hyperglycemia: Secondary | ICD-10-CM | POA: Diagnosis present

## 2022-01-01 DIAGNOSIS — C3490 Malignant neoplasm of unspecified part of unspecified bronchus or lung: Secondary | ICD-10-CM | POA: Diagnosis present

## 2022-01-01 DIAGNOSIS — Z79899 Other long term (current) drug therapy: Secondary | ICD-10-CM

## 2022-01-01 DIAGNOSIS — G4730 Sleep apnea, unspecified: Secondary | ICD-10-CM | POA: Diagnosis present

## 2022-01-01 DIAGNOSIS — Z923 Personal history of irradiation: Secondary | ICD-10-CM | POA: Diagnosis not present

## 2022-01-01 DIAGNOSIS — Z87891 Personal history of nicotine dependence: Secondary | ICD-10-CM | POA: Diagnosis not present

## 2022-01-01 DIAGNOSIS — Z809 Family history of malignant neoplasm, unspecified: Secondary | ICD-10-CM

## 2022-01-01 DIAGNOSIS — I428 Other cardiomyopathies: Secondary | ICD-10-CM | POA: Diagnosis not present

## 2022-01-01 DIAGNOSIS — J189 Pneumonia, unspecified organism: Secondary | ICD-10-CM

## 2022-01-01 DIAGNOSIS — E785 Hyperlipidemia, unspecified: Secondary | ICD-10-CM | POA: Diagnosis present

## 2022-01-01 DIAGNOSIS — Z833 Family history of diabetes mellitus: Secondary | ICD-10-CM

## 2022-01-01 DIAGNOSIS — Z9221 Personal history of antineoplastic chemotherapy: Secondary | ICD-10-CM | POA: Diagnosis not present

## 2022-01-01 DIAGNOSIS — T50905A Adverse effect of unspecified drugs, medicaments and biological substances, initial encounter: Secondary | ICD-10-CM | POA: Diagnosis not present

## 2022-01-01 DIAGNOSIS — Z20822 Contact with and (suspected) exposure to covid-19: Secondary | ICD-10-CM | POA: Diagnosis present

## 2022-01-01 DIAGNOSIS — E669 Obesity, unspecified: Secondary | ICD-10-CM | POA: Diagnosis present

## 2022-01-01 DIAGNOSIS — G4733 Obstructive sleep apnea (adult) (pediatric): Secondary | ICD-10-CM | POA: Diagnosis present

## 2022-01-01 DIAGNOSIS — J9692 Respiratory failure, unspecified with hypercapnia: Secondary | ICD-10-CM | POA: Diagnosis present

## 2022-01-01 DIAGNOSIS — E873 Alkalosis: Secondary | ICD-10-CM | POA: Diagnosis present

## 2022-01-01 DIAGNOSIS — J704 Drug-induced interstitial lung disorders, unspecified: Secondary | ICD-10-CM | POA: Diagnosis not present

## 2022-01-01 DIAGNOSIS — Z888 Allergy status to other drugs, medicaments and biological substances status: Secondary | ICD-10-CM

## 2022-01-01 DIAGNOSIS — Z6834 Body mass index (BMI) 34.0-34.9, adult: Secondary | ICD-10-CM | POA: Diagnosis not present

## 2022-01-01 DIAGNOSIS — Z853 Personal history of malignant neoplasm of breast: Secondary | ICD-10-CM

## 2022-01-01 DIAGNOSIS — T380X5A Adverse effect of glucocorticoids and synthetic analogues, initial encounter: Secondary | ICD-10-CM | POA: Diagnosis present

## 2022-01-01 DIAGNOSIS — I5043 Acute on chronic combined systolic (congestive) and diastolic (congestive) heart failure: Secondary | ICD-10-CM

## 2022-01-01 DIAGNOSIS — J9811 Atelectasis: Secondary | ICD-10-CM | POA: Diagnosis present

## 2022-01-01 LAB — CBC WITH DIFFERENTIAL/PLATELET
Abs Immature Granulocytes: 0.04 10*3/uL (ref 0.00–0.07)
Basophils Absolute: 0 10*3/uL (ref 0.0–0.1)
Basophils Relative: 0 %
Eosinophils Absolute: 0 10*3/uL (ref 0.0–0.5)
Eosinophils Relative: 0 %
HCT: 40.8 % (ref 36.0–46.0)
Hemoglobin: 12.7 g/dL (ref 12.0–15.0)
Immature Granulocytes: 1 %
Lymphocytes Relative: 11 %
Lymphs Abs: 0.8 10*3/uL (ref 0.7–4.0)
MCH: 31.8 pg (ref 26.0–34.0)
MCHC: 31.1 g/dL (ref 30.0–36.0)
MCV: 102 fL — ABNORMAL HIGH (ref 80.0–100.0)
Monocytes Absolute: 0.1 10*3/uL (ref 0.1–1.0)
Monocytes Relative: 2 %
Neutro Abs: 6.8 10*3/uL (ref 1.7–7.7)
Neutrophils Relative %: 86 %
Platelets: 168 10*3/uL (ref 150–400)
RBC: 4 MIL/uL (ref 3.87–5.11)
RDW: 13.8 % (ref 11.5–15.5)
WBC: 7.9 10*3/uL (ref 4.0–10.5)
nRBC: 0 % (ref 0.0–0.2)

## 2022-01-01 LAB — I-STAT VENOUS BLOOD GAS, ED
Acid-Base Excess: 10 mmol/L — ABNORMAL HIGH (ref 0.0–2.0)
Bicarbonate: 35.3 mmol/L — ABNORMAL HIGH (ref 20.0–28.0)
Calcium, Ion: 1.06 mmol/L — ABNORMAL LOW (ref 1.15–1.40)
HCT: 40 % (ref 36.0–46.0)
Hemoglobin: 13.6 g/dL (ref 12.0–15.0)
O2 Saturation: 86 %
Potassium: 3.8 mmol/L (ref 3.5–5.1)
Sodium: 141 mmol/L (ref 135–145)
TCO2: 37 mmol/L — ABNORMAL HIGH (ref 22–32)
pCO2, Ven: 48.7 mmHg (ref 44–60)
pH, Ven: 7.468 — ABNORMAL HIGH (ref 7.25–7.43)
pO2, Ven: 49 mmHg — ABNORMAL HIGH (ref 32–45)

## 2022-01-01 LAB — MAGNESIUM: Magnesium: 2.1 mg/dL (ref 1.7–2.4)

## 2022-01-01 LAB — COMPREHENSIVE METABOLIC PANEL
ALT: 9 U/L (ref 0–44)
AST: 15 U/L (ref 15–41)
Albumin: 3.3 g/dL — ABNORMAL LOW (ref 3.5–5.0)
Alkaline Phosphatase: 47 U/L (ref 38–126)
Anion gap: 13 (ref 5–15)
BUN: 15 mg/dL (ref 8–23)
CO2: 29 mmol/L (ref 22–32)
Calcium: 8.9 mg/dL (ref 8.9–10.3)
Chloride: 98 mmol/L (ref 98–111)
Creatinine, Ser: 1.23 mg/dL — ABNORMAL HIGH (ref 0.44–1.00)
GFR, Estimated: 46 mL/min — ABNORMAL LOW (ref >60)
Glucose, Bld: 170 mg/dL — ABNORMAL HIGH (ref 70–99)
Potassium: 4 mmol/L (ref 3.5–5.1)
Sodium: 140 mmol/L (ref 135–145)
Total Bilirubin: 1 mg/dL (ref 0.3–1.2)
Total Protein: 6.5 g/dL (ref 6.5–8.1)

## 2022-01-01 LAB — TROPONIN I (HIGH SENSITIVITY)
Troponin I (High Sensitivity): 30 ng/L — ABNORMAL HIGH (ref <18)
Troponin I (High Sensitivity): 29 ng/L — ABNORMAL HIGH (ref <18)

## 2022-01-01 LAB — GLUCOSE, CAPILLARY: Glucose-Capillary: 204 mg/dL — ABNORMAL HIGH (ref 70–99)

## 2022-01-01 LAB — BRAIN NATRIURETIC PEPTIDE: B Natriuretic Peptide: 2425.8 pg/mL — ABNORMAL HIGH (ref 0.0–100.0)

## 2022-01-01 MED ORDER — ENOXAPARIN SODIUM 40 MG/0.4ML IJ SOSY
40.0000 mg | PREFILLED_SYRINGE | Freq: Every day | INTRAMUSCULAR | Status: DC
  Administered 2022-01-01 – 2022-01-05 (×5): 40 mg via SUBCUTANEOUS
  Filled 2022-01-01 (×5): qty 0.4

## 2022-01-01 MED ORDER — INSULIN ASPART 100 UNIT/ML IJ SOLN
0.0000 [IU] | Freq: Three times a day (TID) | INTRAMUSCULAR | Status: DC
  Administered 2022-01-02 (×2): 2 [IU] via SUBCUTANEOUS
  Administered 2022-01-03: 3 [IU] via SUBCUTANEOUS
  Administered 2022-01-04: 2 [IU] via SUBCUTANEOUS
  Administered 2022-01-04: 7 [IU] via SUBCUTANEOUS
  Administered 2022-01-05: 2 [IU] via SUBCUTANEOUS
  Administered 2022-01-05: 3 [IU] via SUBCUTANEOUS
  Administered 2022-01-06: 1 [IU] via SUBCUTANEOUS

## 2022-01-01 MED ORDER — FUROSEMIDE 10 MG/ML IJ SOLN
40.0000 mg | Freq: Every day | INTRAMUSCULAR | Status: DC
  Administered 2022-01-02: 40 mg via INTRAVENOUS
  Filled 2022-01-01: qty 4

## 2022-01-01 MED ORDER — FUROSEMIDE 10 MG/ML IJ SOLN
40.0000 mg | Freq: Once | INTRAMUSCULAR | Status: AC
  Administered 2022-01-01: 40 mg via INTRAVENOUS
  Filled 2022-01-01: qty 4

## 2022-01-01 MED ORDER — IOHEXOL 350 MG/ML SOLN
55.0000 mL | Freq: Once | INTRAVENOUS | Status: AC | PRN
  Administered 2022-01-01: 55 mL via INTRAVENOUS

## 2022-01-01 NOTE — Assessment & Plan Note (Addendum)
Presented with bilateral pleural effusion on chest x-ray, elevated BNP of greater than 2000 and generalized anasarca concerning for new onset CHF.  Possibly could be steroid induced and the fact that she discontinued her CPAP for several weeks. -Patient recently started on steroid course for concerns of pneumonitis on Keytruda.  Will discontinue for now. -Continue to wean BiPAP as tolerated with O2 goal of greater than 92%. Would keep her on 2L O2 at baseline as pt is already in the process of receiving O2 output. Patient normally wears CPAP at night. -Continue IV Lasix 40 mg daily.  Monitor intake and output. -Obtain echocardiogram

## 2022-01-01 NOTE — ED Notes (Signed)
ED TO INPATIENT HANDOFF REPORT  ED Nurse Name and Phone #: Shirlee Limerick [phone]  S Name/Age/Gender Charlene Houston 76 y.o. female Room/Bed: 022C/022C  Code Status   Code Status: Prior  Home/SNF/Other Home Patient oriented to: self, place, time, and situation Is this baseline? Yes   Triage Complete: Triage complete  Chief Complaint Acute respiratory failure with hypoxia ([place]) [J96.01]  Triage Note Pt bib gcems from home for respiratory distress starting approx 1 hour ago. Diminished lung sounds nad 86% on RA per ems. Pt arrives on CPAP. Hx of asthma and lung cancer. 1 neb, 1 duoneb, and 125 solumedrol given pta.   BP 145/86 HR 115, Spo2 97%    Allergies Allergies  Allergen Reactions   Prozac [Fluoxetine Hcl] Other (See Comments)    Gittery   Vit D-Vit E-Safflower Oil Other (See Comments)    Not specified    Level of Care/Admitting Diagnosis ED Disposition     ED Disposition  Admit   Condition  --   Standard City: [place] [[id number]]  Level of Care: Progressive [102]  Admit to Progressive based on following criteria: RESPIRATORY PROBLEMS hypoxemic/hypercapnic respiratory failure that is responsive to NIPPV (BiPAP) or High Flow Nasal Cannula (6-80 lpm). Frequent assessment/intervention, no > Q2 hrs < Q4 hrs, to maintain oxygenation and pulmonary hygiene.  May admit patient to [facility] or [facility] if equivalent level of care is available:: No  Covid Evaluation: Asymptomatic - no recent exposure (last 10 days) testing not required  Diagnosis: Acute respiratory failure with hypoxia [facility]) [[id number]]  Admitting Physician: Orene Desanctis [[id number]]  Attending Physician: Orene Desanctis [[id number]]  Certification:: I certify this patient will need inpatient services for at least 2 midnights  Estimated Length of Stay: 3          B Medical/Surgery History Past Medical History:  Diagnosis Date   Arthritis    Asthma    Breast cancer ([place])     Diabetes mellitus without complication ([place])    Hypertension    SBO (small bowel obstruction) ([place]) 06/2018   Past Surgical History:  Procedure Laterality Date   ABDOMINAL HYSTERECTOMY     THUMB AMPUTATION Left    PARTIAL   TONSILLECTOMY       A IV Location/Drains/Wounds Patient Lines/Drains/Airways Status     Active Line/Drains/Airways     Name Placement date Placement time Site Days   Peripheral IV 01/01/22 20 G Right Antecubital 01/01/22  1646  Antecubital  less than 1   External Urinary Catheter 06/09/21  2157  --  206   External Urinary Catheter 07/05/21  1250  --  180            Intake/Output Last 24 hours No intake or output data in the 24 hours ending 01/01/22 2226  Labs/Imaging Results for orders placed or performed during the hospital encounter of 01/01/22 (from the past 48 hour(s))  CBC with Differential     Status: Abnormal   Collection Time: 01/01/22  4:43 PM  Result Value Ref Range   WBC 7.9 4.0 - 10.5 K/uL   RBC 4.00 3.87 - 5.11 MIL/uL   Hemoglobin 12.7 12.0 - 15.0 g/dL   HCT 40.8 36.0 - 46.0 %   MCV 102.0 (H) 80.0 - 100.0 fL   MCH 31.8 26.0 - 34.0 pg   MCHC 31.1 30.0 - 36.0 g/dL   RDW 13.8 11.5 - 15.5 %   Platelets 168 150 - 400 K/uL  nRBC 0.0 0.0 - 0.2 %   Neutrophils Relative % 86 %   Neutro Abs 6.8 1.7 - 7.7 K/uL   Lymphocytes Relative 11 %   Lymphs Abs 0.8 0.7 - 4.0 K/uL   Monocytes Relative 2 %   Monocytes Absolute 0.1 0.1 - 1.0 K/uL   Eosinophils Relative 0 %   Eosinophils Absolute 0.0 0.0 - 0.5 K/uL   Basophils Relative 0 %   Basophils Absolute 0.0 0.0 - 0.1 K/uL   Immature Granulocytes 1 %   Abs Immature Granulocytes 0.04 0.00 - 0.07 K/uL    Comment: Performed at [facility] [address] [place], [place] [address]  Comprehensive metabolic panel     Status: Abnormal   Collection Time: 01/01/22  4:43 PM  Result Value Ref Range   Sodium 140 135 - 145 mmol/L   Potassium 4.0 3.5 - 5.1 mmol/L   Chloride 98 98 - 111 mmol/L    CO2 29 22 - 32 mmol/L   Glucose, Bld 170 (H) 70 - 99 mg/dL    Comment: Glucose reference range applies only to samples taken after fasting for at least 8 hours.   BUN 15 8 - 23 mg/dL   Creatinine, Ser 1.23 (H) 0.44 - 1.00 mg/dL   Calcium 8.9 8.9 - 10.3 mg/dL   Total Protein 6.5 6.5 - 8.1 g/dL   Albumin 3.3 (L) 3.5 - 5.0 g/dL   AST 15 15 - 41 U/L   ALT 9 0 - 44 U/L   Alkaline Phosphatase 47 38 - 126 U/L   Total Bilirubin 1.0 0.3 - 1.2 mg/dL   GFR, Estimated 46 (L) >60 mL/min    Comment: (NOTE) Calculated using the CKD-EPI Creatinine Equation (2021)    Anion gap 13 5 - 15    Comment: Performed at [facility] [address], [place], [place] [address]  Troponin I (High Sensitivity)     Status: Abnormal   Collection Time: 01/01/22  4:43 PM  Result Value Ref Range   Troponin I (High Sensitivity) 30 (H) <18 ng/L    Comment: (NOTE) Elevated high sensitivity troponin I (hsTnI) values and significant  changes across serial measurements may suggest ACS but many other  chronic and acute conditions are known to elevate hsTnI results.  Refer to the "Links" section for chest pain algorithms and additional  guidance. Performed at [facility], [place] [address], [place], [place] [address]   Brain natriuretic peptide     Status: Abnormal   Collection Time: 01/01/22  4:43 PM  Result Value Ref Range   B Natriuretic Peptide 2,425.8 (H) 0.0 - 100.0 pg/mL    Comment: Performed at [facility] [address], [place], [place] [address]  Magnesium     Status: None   Collection Time: 01/01/22  4:43 PM  Result Value Ref Range   Magnesium 2.1 1.7 - 2.4 mg/dL    Comment: Performed at [facility], [place] [address], [place], [place] [address]  I-Stat venous blood gas, [facility] ED only)     Status: Abnormal   Collection Time: 01/01/22  5:22 PM  Result Value Ref Range   pH, Ven 7.468 (H) 7.25 - 7.43   pCO2, Ven 48.7 44 - 60 mmHg   pO2, Ven 49 (H) 32 - 45 mmHg   Bicarbonate 35.3  (H) 20.0 - 28.0 mmol/L   TCO2 37 (H) 22 - 32 mmol/L   O2 Saturation 86 %  Acid-Base Excess 10.0 (H) 0.0 - 2.0 mmol/L   Sodium 141 135 - 145 mmol/L   Potassium 3.8 3.5 - 5.1 mmol/L   Calcium, Ion 1.06 (L) 1.15 - 1.40 mmol/L   HCT 40.0 36.0 - 46.0 %   Hemoglobin 13.6 12.0 - 15.0 g/dL   Sample type VENOUS   Troponin I (High Sensitivity)     Status: Abnormal   Collection Time: 01/01/22  8:16 PM  Result Value Ref Range   Troponin I (High Sensitivity) 29 (H) <18 ng/L    Comment: (NOTE) Elevated high sensitivity troponin I (hsTnI) values and significant  changes across serial measurements may suggest ACS but many other  chronic and acute conditions are known to elevate hsTnI results.  Refer to the "Links" section for chest pain algorithms and additional  guidance. Performed at [facility], [place] [address], [place], [place] [address]    CT Angio Chest PE W and/or Wo Contrast  Result Date: 01/01/2022 CLINICAL DATA:  Respiratory distress starting 1 hour ago diminished lung sounds; PE suspected high probability EXAM: CT ANGIOGRAPHY CHEST WITH CONTRAST TECHNIQUE: Multidetector CT imaging of the chest was performed using the standard protocol during bolus administration of intravenous contrast. Multiplanar CT image reconstructions and MIPs were obtained to evaluate the vascular anatomy. RADIATION DOSE REDUCTION: This exam was performed according to the departmental dose-optimization program which includes automated exposure control, adjustment of the mA and/or kV according to patient size and/or use of iterative reconstruction technique. CONTRAST:  1mL OMNIPAQUE IOHEXOL 350 MG/ML SOLN COMPARISON:  Radiographs earlier today and CT chest 06/10/2021 FINDINGS: Cardiovascular: Satisfactory opacification of the central pulmonary arteries. Evaluation of the segmental and subsegmental branches is limited due to respiratory motion artifact. No pulmonary embolism identified. Cardiomegaly. Coronary artery  calcification. Aortic calcification. No pericardial effusion. Right chest wall Port-A-Cath. Mediastinum/Nodes: No enlarged mediastinal, hilar, or axillary lymph nodes. Thyroid gland, trachea, and esophagus demonstrate no significant findings. Lungs/Pleura: New small bilateral pleural effusions and compressive atelectasis. Numerous pulmonary nodules are redemonstrated again measuring up to 1.3 cm in the medial right lower lobe (series 7/image 69). Similar patchy ground-glass opacities bilaterally which are nonspecific and may be due to edema, infection, or metastatic disease. Upper Abdomen: No acute abnormality. Musculoskeletal: No chest wall abnormality. No acute osseous findings. Surgical clips right breast. Review of the MIP images confirms the above findings. IMPRESSION: 1. No CT evidence of pulmonary embolus. 2. New small bilateral pleural effusions and compressive atelectasis. 3. Multiple bilateral pulmonary nodules concerning for metastatic disease grossly similar to 06/10/2021. 4. Cardiomegaly and coronary artery vascular calcification. Electronically Signed   By: Placido Sou M.D.   On: 01/01/2022 20:10   DG Chest Port 1 View  Result Date: 01/01/2022 CLINICAL DATA:  Shortness of breath EXAM: PORTABLE CHEST 1 VIEW COMPARISON:  Chest x-ray 07/16/2021 FINDINGS: Right chest port catheter tip projects over the distal SVC. The heart is enlarged, unchanged. There are minimal bibasilar opacities favored is atelectasis. No pleural effusion or pneumothorax. No acute fractures. IMPRESSION: 1. Minimal bibasilar atelectasis. 2. Stable cardiomegaly. Electronically Signed   By: Ronney Asters M.D.   On: 01/01/2022 17:44    Pending Labs Unresulted Labs (From admission, onward)    None       Vitals/Pain Today's Vitals   01/01/22 2100 01/01/22 2115 01/01/22 2130 01/01/22 2151  BP: 135/77 131/74 130/73   Pulse: 83 80 79   Resp: 19 (!) 24 (!) 27   Temp:    98.9 F (37.2 C)  TempSrc:      SpO2: 100% 99%  98%   Weight:      Height:        Isolation Precautions No active isolations  Medications Medications  iohexol (OMNIPAQUE) 350 MG/ML injection 55 mL (55 mLs Intravenous Contrast Given 01/01/22 1957)  furosemide (LASIX) injection 40 mg (40 mg Intravenous Given 01/01/22 2149)    Mobility non-ambulatory High fall risk   Focused Assessments Renal Assessment Handoff:  , Pulmonary Assessment Handoff:  Lung sounds: Bilateral Breath Sounds: Diminished L Breath Sounds: Diminished R Breath Sounds: Diminished O2 Device: CPAP      R Recommendations: See Admitting Provider Note  Report given to:   Additional Notes:

## 2022-01-01 NOTE — Assessment & Plan Note (Signed)
-   Continue home statin °

## 2022-01-01 NOTE — H&P (Signed)
History and Physical    Patient: Charlene Houston IRW:[id number] DOB: 03/26/1946 DOA: 01/01/2022 DOS: the patient was seen and examined on 01/01/2022 PCP: Lilian Coma., MD  Patient coming from: Home  Chief Complaint:  Chief Complaint  Patient presents with   Respiratory Distress   HPI: Charlene Houston is a 76 y.o. 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  She follows with [facility] hematology and was seen 12/03/21 with dyspnea and hypoxia to 70%. She was negative for COVID and had CTA negative for PE but showed edema vs pneumonitis. She was started on steroid taper and immunotherapy was dicontinued due to concerns of immunotherapy-induced pneumonitis. She followed up 12/24/21 and was hypoxia to 86% and advised to start O2 supplement at home and started on longer steroid taper.  Has still been working to get her oxygen at home. She felt improved with her first course of steroid although overall would say that her respiratory status was worse after discontinuation of Keytruda.  Reports productive cough and chest tightness.  No fever.  No nausea, vomiting or diarrhea.  Has had worsening bilateral lower extremity edema, feels tightness to her abdomen and has swelling in her hands. She is on CPAP at night but has stopped it for for several weeks since she was feeling ill and did not have time to clean her machine.  In the ED, she was afebrile, mildly tachycardic and tachypneic requiring BiPAP. VBG with metabolic alkalosis pH 7.61, CO2 48, bicarb 35.  No leukocytosis or anemia. Mild AKI of 1.33 up from 0.95 and upward trending anion gap at 13. BNP was elevated 2425, troponin of 30.  CTA chest was negative for PE although segmental and subsegmental branches limited due to  respiratory motion artifact.  New bilateral small pleural effusion and compressive atelectasis.  Multiple bilateral pulmonary nodules concerning for metastatic disease similar to prior. Review of Systems: As mentioned in the history of present illness. All other systems reviewed and are negative. Past Medical History:  Diagnosis Date   Arthritis    Asthma    Breast cancer ([place])    Diabetes mellitus without complication ([place])    Hypertension    SBO (small bowel obstruction) ([place]) 06/2018   Past Surgical History:  Procedure Laterality Date   ABDOMINAL HYSTERECTOMY     THUMB AMPUTATION Left    PARTIAL   TONSILLECTOMY     Social History:  reports that she quit smoking about 28 years ago. Her smoking use included cigarettes. She has never used smokeless tobacco. She reports that she does not currently use alcohol. She reports that she does not currently use drugs.  Allergies  Allergen Reactions   Prozac [Fluoxetine Hcl] Other (See Comments)    Gittery   Vit D-Vit E-Safflower Oil Other (See Comments)    Not specified    Family History  Problem Relation Age of Onset   Hypertension Mother    Diabetes Mother    Cancer Father     Prior to Admission medications   Medication Sig Start Date End Date Taking? Authorizing Provider  acetaminophen (TYLENOL) 325 MG tablet Take 650 mg by mouth every 6 (six) hours as needed for mild pain or headache.    [provider]  albuterol (ACCUNEB) 1.25 MG/3ML nebulizer solution Take 3 mLs by nebulization every 6 (six)  hours as needed for wheezing. 01/16/21   [provider]  albuterol (PROVENTIL HFA;VENTOLIN HFA) 108 (90 Base) MCG/ACT inhaler Inhale 1-2 puffs into the lungs every 6 (six) hours as needed for wheezing or shortness of breath.    [provider]  azithromycin (ZITHROMAX) 250 MG tablet Take first 2 tablets together, then 1 every day until finished. 07/16/21   LampteyMyrene Galas, MD  carboxymethylcellulose (REFRESH PLUS)  0.5 % SOLN Place 1 drop into both eyes daily as needed (dry eyes).    [provider]  cyanocobalamin (,VITAMIN B-12,) 1000 MCG/ML injection Inject 1,000 mcg into the muscle every 30 (thirty) days.    [provider]  desonide (DESOWEN) 0.05 % cream Apply topically 2 (two) times daily. 09/03/20   Raspet, Derry Skill, PA-C  Ergocalciferol 10 MCG (400 UNIT) TABS Take 800-1,200 Units by mouth See admin instructions. Taking 2 tabs ( 800 units) on Sunday and 3 tabs (1200) units daily except Sunday. 12/23/20   [provider]  fluticasone (FLONASE) 50 MCG/ACT nasal spray Place 1 spray into both nostrils as needed for allergies or rhinitis.     [provider]  furosemide (LASIX) 20 MG tablet Take 1 tablet (20 mg total) by mouth daily. 07/16/21 08/15/21  Chase Picket, MD  gabapentin (NEURONTIN) 300 MG capsule Take 300 mg by mouth 2 (two) times daily. Taking twice daily 10/26/17   [provider]  losartan (COZAAR) 25 MG tablet Take 1 tablet (25 mg total) by mouth daily. 07/16/21   Lamptey, Myrene Galas, MD  midodrine (PROAMATINE) 5 MG tablet Take 1 tablet (5 mg total) by mouth 3 (three) times daily with meals. 07/05/21   Isla Pence, MD  mirtazapine (REMERON) 15 MG tablet Take 1 tablet (15 mg total) by mouth at bedtime. 04/23/19   Robyn Haber, MD  montelukast (SINGULAIR) 10 MG tablet Take 10 mg by mouth daily. 04/28/21   [provider]  potassium chloride SA (KLOR-CON M) 20 MEQ tablet Take 20 mEq by mouth daily. 05/04/21   [provider]  simvastatin (ZOCOR) 40 MG tablet Take 40 mg by mouth at bedtime. 03/10/20   [provider]  triamcinolone cream (KENALOG) 0.5 % Apply 1 application topically 3 (three) times daily. 10/06/17   [provider]  escitalopram (LEXAPRO) 5 MG tablet Take 5 mg by mouth daily. 06/15/18 04/23/19  [provider]    Physical Exam: Vitals:   01/01/22 2215 01/01/22 2230 01/01/22 2310 01/01/22 2316   BP: 132/73 132/77  129/74  Pulse: 81 81 78 86  Resp: (!) 21 17 (!) 32 (!) 21  Temp:    99.2 F (37.3 C)  TempSrc:    Oral  SpO2: 99% 98% 99% 98%  Weight:    95.1 kg  Height:       Constitutional: NAD, calm, comfortable, obese female laying at approximately 40 degree incline in bed on BiPAP Eyes:  lids and conjunctivae normal ENMT: Mucous membranes are moist. Neck: normal, supple, Respiratory: clear to auscultation anteriorly on BiPAP no wheezing, no crackles. Normal respiratory effort. No accessory muscle use.  Able to speak in short sentences without labored respiration on BiPAP. Cardiovascular: Regular rate and rhythm, no murmurs / rubs / gallops.  +4 bilateral lower extremity pitting edema up to knee.  Nonpitting edema bilateral hands. Abdomen: Soft, nontender nondistended.. Bowel sounds positive.  Musculoskeletal: no clubbing / cyanosis. No joint deformity upper and lower extremities. Good ROM, no contractures. Normal muscle tone.  Skin:  no rashes, lesions, ulcers. No induration Neurologic: CN 2-12 grossly intact. Sensation intact, DTR normal. Strength 5/5 in all 4.  Psychiatric: Normal judgment and insight. Alert and oriented x 3. Normal mood. Data Reviewed:  See HPI  Assessment and Plan: * Acute respiratory failure with hypoxia ([place]) Presented with bilateral pleural effusion on chest x-ray, elevated BNP of greater than 2000 and generalized anasarca concerning for new onset CHF.  Possibly could be steroid induced and the fact that she discontinued her CPAP for several weeks. -Patient recently started on steroid course for concerns of pneumonitis on Keytruda.  Will discontinue for now. -Continue to wean BiPAP as tolerated with O2 goal of greater than 92%. Would keep her on 2L O2 at baseline as pt is already in the process of receiving O2 output. Patient normally wears CPAP at night. -Continue IV Lasix 40 mg daily.  Monitor intake and output. -Obtain echocardiogram  AKI (acute  kidney injury) ([place]) Creatinine elevated 1.23 from prior 0.95. -Could be due to cardiorenal syndrome.  Continue to monitor closely while on IV diuresis.  Non-small cell lung cancer ([place]) Follows with [facility] oncology on Keytruda.  However treatment has been discontinued for several weeks due to concerns for pneumonitis. -CTA chest today with stable metastatic disease  Sleep apnea Will need CPAP at night if she is able to wean off BiPAP  HLD (hyperlipidemia) Continue home statin  Essential hypertension Continue home antihypertensive      Advance Care Planning:   Code Status: Full Code   Consults: None  Family Communication: None at bedside  Severity of Illness: The appropriate patient status for this patient is INPATIENT. Inpatient status is judged to be reasonable and necessary in order to provide the required intensity of service to ensure the patient's safety. The patient's presenting symptoms, physical exam findings, and initial radiographic and laboratory data in the context of their chronic comorbidities is felt to place them at high risk for further clinical deterioration. Furthermore, it is not anticipated that the patient will be medically stable for discharge from the hospital within 2 midnights of admission.   * I certify that at the point of admission it is my clinical judgment that the patient will require inpatient hospital care spanning beyond 2 midnights from the point of admission due to high intensity of service, high risk for further deterioration and high frequency of surveillance required.*  Author: Orene Desanctis, DO 01/01/2022 11:29 PM  For on call review [email or web address].

## 2022-01-01 NOTE — Assessment & Plan Note (Signed)
Continue home antihypertensive

## 2022-01-01 NOTE — Assessment & Plan Note (Signed)
Creatinine elevated 1.23 from prior 0.95. -Could be due to cardiorenal syndrome.  Continue to monitor closely while on IV diuresis.

## 2022-01-01 NOTE — ED Notes (Signed)
Patient transported to CT with RN and RT

## 2022-01-01 NOTE — ED Triage Notes (Signed)
Pt bib gcems from home for respiratory distress starting approx 1 hour ago. Diminished lung sounds nad 86% on RA per ems. Pt arrives on CPAP. Hx of asthma and lung cancer. 1 neb, 1 duoneb, and 125 solumedrol given pta.   BP 145/86 HR 115, Spo2 97%

## 2022-01-01 NOTE — ED Provider Notes (Signed)
[facility] EMERGENCY DEPARTMENT Provider Note   CSN: [id number] Arrival date & time: 01/01/22  1620     History  Chief Complaint  Patient presents with   Respiratory Distress    Charlene Houston is a 76 y.o. female. Presenting with acute worsening of shortness of breath and chest tightness approximately 1 hour prior to arrival.  Patient lives alone.  She called EMS. She is not currently on home oxygen, but she states she is in the process of starting this. She received breathing treatments and was placed on CPAP with EMS.  On my evaluation she reports some improvement with these interventions.  She reports continued chest tightness.  She reports chronic productive cough in the setting of her known lung cancer.  She denies any change in her productive cough.  States her sputum is clear.  She reports that she is currently on immunotherapy and just completed a steroid taper.  She reports compliance with all of her medications, including her Lasix which was recently increased due to lower extremity edema.  She does not report any worsening lower extremity edema over the past few days. HPI     Home Medications Prior to Admission medications   Medication Sig Start Date End Date Taking? Authorizing Provider  acetaminophen (TYLENOL) 325 MG tablet Take 650 mg by mouth every 6 (six) hours as needed for mild pain or headache.    [provider]  albuterol (ACCUNEB) 1.25 MG/3ML nebulizer solution Take 3 mLs by nebulization every 6 (six) hours as needed for wheezing. 01/16/21   [provider]  albuterol (PROVENTIL HFA;VENTOLIN HFA) 108 (90 Base) MCG/ACT inhaler Inhale 1-2 puffs into the lungs every 6 (six) hours as needed for wheezing or shortness of breath.    [provider]  azithromycin (ZITHROMAX) 250 MG tablet Take first 2 tablets together, then 1 every day until finished. 07/16/21   LampteyMyrene Galas, MD  carboxymethylcellulose (REFRESH PLUS)  0.5 % SOLN Place 1 drop into both eyes daily as needed (dry eyes).    [provider]  cyanocobalamin (,VITAMIN B-12,) 1000 MCG/ML injection Inject 1,000 mcg into the muscle every 30 (thirty) days.    [provider]  desonide (DESOWEN) 0.05 % cream Apply topically 2 (two) times daily. 09/03/20   Raspet, Derry Skill, PA-C  Ergocalciferol 10 MCG (400 UNIT) TABS Take 800-1,200 Units by mouth See admin instructions. Taking 2 tabs ( 800 units) on Sunday and 3 tabs (1200) units daily except Sunday. 12/23/20   [provider]  fluticasone (FLONASE) 50 MCG/ACT nasal spray Place 1 spray into both nostrils as needed for allergies or rhinitis.     [provider]  furosemide (LASIX) 20 MG tablet Take 1 tablet (20 mg total) by mouth daily. 07/16/21 08/15/21  Chase Picket, MD  gabapentin (NEURONTIN) 300 MG capsule Take 300 mg by mouth 2 (two) times daily. Taking twice daily 10/26/17   [provider]  losartan (COZAAR) 25 MG tablet Take 1 tablet (25 mg total) by mouth daily. 07/16/21   Lamptey, Myrene Galas, MD  midodrine (PROAMATINE) 5 MG tablet Take 1 tablet (5 mg total) by mouth 3 (three) times daily with meals. 07/05/21   Isla Pence, MD  mirtazapine (REMERON) 15 MG tablet Take 1 tablet (15 mg total) by mouth at bedtime. 04/23/19   Robyn Haber, MD  montelukast (SINGULAIR) 10 MG tablet Take 10 mg by mouth daily. 04/28/21   [provider]  potassium chloride SA (KLOR-CON  M) 20 MEQ tablet Take 20 mEq by mouth daily. 05/04/21   [provider]  simvastatin (ZOCOR) 40 MG tablet Take 40 mg by mouth at bedtime. 03/10/20   [provider]  triamcinolone cream (KENALOG) 0.5 % Apply 1 application topically 3 (three) times daily. 10/06/17   [provider]  escitalopram (LEXAPRO) 5 MG tablet Take 5 mg by mouth daily. 06/15/18 04/23/19  [provider]      Allergies    Prozac [fluoxetine hcl] and Vit d-vit e-safflower oil    Review of  Systems   Review of Systems  Constitutional:  Negative for chills and fever.  HENT:  Negative for ear pain and sore throat.   Eyes:  Negative for pain and visual disturbance.  Respiratory:  Positive for cough, chest tightness and shortness of breath.   Cardiovascular:  Negative for chest pain and palpitations.  Gastrointestinal:  Negative for abdominal pain and vomiting.  Genitourinary:  Negative for dysuria and hematuria.  Musculoskeletal:  Negative for arthralgias and back pain.  Skin:  Negative for color change and rash.  Neurological:  Negative for seizures and syncope.  All other systems reviewed and are negative.   Physical Exam Updated Vital Signs BP 132/77   Pulse 81   Temp 98.9 F (37.2 C)   Resp 17   Ht 5\' 5"  (1.651 m)   Wt 90.7 kg   SpO2 98%   BMI 33.28 kg/m  Physical Exam Vitals and nursing note reviewed.  Constitutional:      General: She is not in acute distress.    Appearance: She is well-developed.  HENT:     Head: Normocephalic and atraumatic.  Eyes:     Conjunctiva/sclera: Conjunctivae normal.  Cardiovascular:     Rate and Rhythm: Normal rate and regular rhythm.     Heart sounds: No murmur heard. Pulmonary:     Effort: Pulmonary effort is normal. No respiratory distress.     Breath sounds: Examination of the right-upper field reveals rhonchi. Examination of the left-upper field reveals rhonchi. Examination of the right-middle field reveals rhonchi. Examination of the left-middle field reveals rhonchi. Examination of the right-lower field reveals decreased breath sounds. Examination of the left-lower field reveals decreased breath sounds. Decreased breath sounds and rhonchi present.  Abdominal:     Palpations: Abdomen is soft.     Tenderness: There is no abdominal tenderness.  Musculoskeletal:        General: No swelling.     Cervical back: Neck supple.  Skin:    General: Skin is warm and dry.     Capillary Refill: Capillary refill takes less than 2  seconds.  Neurological:     Mental Status: She is alert.  Psychiatric:        Mood and Affect: Mood normal.     ED Results / Procedures / Treatments   Labs (all labs ordered are listed, but only abnormal results are displayed) Labs Reviewed  CBC WITH DIFFERENTIAL/PLATELET - Abnormal; Notable for the following components:      Result Value   MCV 102.0 (*)    All other components within normal limits  COMPREHENSIVE METABOLIC PANEL - Abnormal; Notable for the following components:   Glucose, Bld 170 (*)    Creatinine, Ser 1.23 (*)    Albumin 3.3 (*)    GFR, Estimated 46 (*)    All other components within normal limits  BRAIN NATRIURETIC PEPTIDE - Abnormal; Notable for the following components:   B Natriuretic Peptide  2,425.8 (*)    All other components within normal limits  I-STAT VENOUS BLOOD GAS, ED - Abnormal; Notable for the following components:   pH, Ven 7.468 (*)    pO2, Ven 49 (*)    Bicarbonate 35.3 (*)    TCO2 37 (*)    Acid-Base Excess 10.0 (*)    Calcium, Ion 1.06 (*)    All other components within normal limits  TROPONIN I (HIGH SENSITIVITY) - Abnormal; Notable for the following components:   Troponin I (High Sensitivity) 30 (*)    All other components within normal limits  TROPONIN I (HIGH SENSITIVITY) - Abnormal; Notable for the following components:   Troponin I (High Sensitivity) 29 (*)    All other components within normal limits  MAGNESIUM    EKG EKG Interpretation  Date/Time:  Friday January 01 2022 16:31:09 EDT Ventricular Rate:  101 PR Interval:  138 QRS Duration: 153 QT Interval:  365 QTC Calculation: 474 R Axis:   8 Text Interpretation: Sinus tachycardia Ventricular premature complex Left bundle branch block No significant change since last tracing Confirmed by Fredia Sorrow [phone]) on 01/01/2022 4:33:54 PM  Radiology CT Angio Chest PE W and/or Wo Contrast  Result Date: 01/01/2022 CLINICAL DATA:  Respiratory distress starting 1 hour  ago diminished lung sounds; PE suspected high probability EXAM: CT ANGIOGRAPHY CHEST WITH CONTRAST TECHNIQUE: Multidetector CT imaging of the chest was performed using the standard protocol during bolus administration of intravenous contrast. Multiplanar CT image reconstructions and MIPs were obtained to evaluate the vascular anatomy. RADIATION DOSE REDUCTION: This exam was performed according to the departmental dose-optimization program which includes automated exposure control, adjustment of the mA and/or kV according to patient size and/or use of iterative reconstruction technique. CONTRAST:  50mL OMNIPAQUE IOHEXOL 350 MG/ML SOLN COMPARISON:  Radiographs earlier today and CT chest 06/10/2021 FINDINGS: Cardiovascular: Satisfactory opacification of the central pulmonary arteries. Evaluation of the segmental and subsegmental branches is limited due to respiratory motion artifact. No pulmonary embolism identified. Cardiomegaly. Coronary artery calcification. Aortic calcification. No pericardial effusion. Right chest wall Port-A-Cath. Mediastinum/Nodes: No enlarged mediastinal, hilar, or axillary lymph nodes. Thyroid gland, trachea, and esophagus demonstrate no significant findings. Lungs/Pleura: New small bilateral pleural effusions and compressive atelectasis. Numerous pulmonary nodules are redemonstrated again measuring up to 1.3 cm in the medial right lower lobe (series 7/image 69). Similar patchy ground-glass opacities bilaterally which are nonspecific and may be due to edema, infection, or metastatic disease. Upper Abdomen: No acute abnormality. Musculoskeletal: No chest wall abnormality. No acute osseous findings. Surgical clips right breast. Review of the MIP images confirms the above findings. IMPRESSION: 1. No CT evidence of pulmonary embolus. 2. New small bilateral pleural effusions and compressive atelectasis. 3. Multiple bilateral pulmonary nodules concerning for metastatic disease grossly similar to  06/10/2021. 4. Cardiomegaly and coronary artery vascular calcification. Electronically Signed   By: Placido Sou M.D.   On: 01/01/2022 20:10   DG Chest Port 1 View  Result Date: 01/01/2022 CLINICAL DATA:  Shortness of breath EXAM: PORTABLE CHEST 1 VIEW COMPARISON:  Chest x-ray 07/16/2021 FINDINGS: Right chest port catheter tip projects over the distal SVC. The heart is enlarged, unchanged. There are minimal bibasilar opacities favored is atelectasis. No pleural effusion or pneumothorax. No acute fractures. IMPRESSION: 1. Minimal bibasilar atelectasis. 2. Stable cardiomegaly. Electronically Signed   By: Ronney Asters M.D.   On: 01/01/2022 17:44    Procedures Procedures    Medications Ordered in ED Medications  iohexol (OMNIPAQUE) 350 MG/ML injection  55 mL (55 mLs Intravenous Contrast Given 01/01/22 1957)  furosemide (LASIX) injection 40 mg (40 mg Intravenous Given 01/01/22 2149)    ED Course/ Medical Decision Making/ A&P Clinical Course as of 01/01/22 2238  Fri Jan 01, 2022  1752 O2 Saturation: 43 [ML]    Clinical Course User Index [ML] Rosine Abe, MD                           Medical Decision Making Amount and/or Complexity of Data Reviewed Labs: ordered. Decision-making details documented in ED Course. Radiology: ordered.  Risk Prescription drug management. Decision regarding hospitalization.   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  Arrived via EMS.  EMS noted diminished breath sounds.  Patient was 86% on room air.  Patient was given 1 DuoNeb, and 125 Solu-Medrol prior to arrival.  Arrived on CPAP, then transition to BiPAP.  Reviewed patient's most recent office visit with hematology oncology on 12/24/2021.  At that time she was 86% on room air at rest and was started on O2 via  concentrator with improvement of her symptoms.  She was also started on a steroid taper.  At that time she also developed bilateral lower extremity edema, and had a prior CT scan reviewed that showed pulmonary edema.  That time her Lasix was increased from 20 mg to 40 mg daily.  Per documentation she was to be started on home O2 at this time.  However, when I spoke with the patient she states she has not yet received home oxygen therapy.  Differential diagnosis includes worsening pneumonitis, pulmonary edema, pneumonia, PE, reactive airway disease, pneumothorax, ACS, arrhythmia, electrolyte abnormalities, AKI.  EKG shows sinus tachycardia, rate 101.  Normal axis and intervals.  Left bundle branch block noted.  Appears similar to prior. Initial blood gas reassuring, pH 746 and PCO2 48.  Chest x-ray showed stable cardiomegaly, with small amount of bilateral atelectasis.  No large pulmonary edema. CT PE negative for pulm embolism, did show a bilateral small pleural effusions.  Labs reviewed.  Significant for BNP of 2400.  Initial troponin 30.  Mild elevation in creatinine to 1.23 from baseline of 0.9.  No leukocytosis or significant anemia.  Patient was trialed off of BiPAP, however she became more tachypneic and uncomfortable, so BiPAP was reinitiated.  Overall, patient requires admission for acute hypoxic respiratory failure in the setting of metastatic lung cancer, pneumonitis, and new pleural effusions.         Final Clinical Impression(s) / ED Diagnoses Final diagnoses:  Acute respiratory failure with hypoxia [facility])    Rx / DC Orders ED Discharge Orders     None         Rosine Abe, MD 01/01/22 6734    Fredia Sorrow, MD 01/08/22 [phone]

## 2022-01-01 NOTE — Progress Notes (Signed)
RT attempted to take patient off BIPAP and place on 4L [place]. Patient did not tolerate, patient stated she felt short of breath off of BIPAP and requested it be put back on. RT placed patient back on BIPAP.

## 2022-01-01 NOTE — Assessment & Plan Note (Signed)
Will need CPAP at night if she is able to wean off BiPAP

## 2022-01-01 NOTE — Assessment & Plan Note (Signed)
Follows with [facility] oncology on Keytruda.  However treatment has been discontinued for several weeks due to concerns for pneumonitis. -CTA chest today with stable metastatic disease

## 2022-01-02 ENCOUNTER — Inpatient Hospital Stay (HOSPITAL_COMMUNITY): Payer: Medicare HMO

## 2022-01-02 DIAGNOSIS — J9601 Acute respiratory failure with hypoxia: Secondary | ICD-10-CM | POA: Diagnosis not present

## 2022-01-02 DIAGNOSIS — C3490 Malignant neoplasm of unspecified part of unspecified bronchus or lung: Secondary | ICD-10-CM | POA: Diagnosis not present

## 2022-01-02 DIAGNOSIS — I10 Essential (primary) hypertension: Secondary | ICD-10-CM | POA: Diagnosis not present

## 2022-01-02 DIAGNOSIS — N179 Acute kidney failure, unspecified: Secondary | ICD-10-CM | POA: Diagnosis not present

## 2022-01-02 DIAGNOSIS — I5021 Acute systolic (congestive) heart failure: Secondary | ICD-10-CM | POA: Diagnosis not present

## 2022-01-02 LAB — BASIC METABOLIC PANEL
Anion gap: 10 (ref 5–15)
BUN: 14 mg/dL (ref 8–23)
CO2: 34 mmol/L — ABNORMAL HIGH (ref 22–32)
Calcium: 8.6 mg/dL — ABNORMAL LOW (ref 8.9–10.3)
Chloride: 99 mmol/L (ref 98–111)
Creatinine, Ser: 1.04 mg/dL — ABNORMAL HIGH (ref 0.44–1.00)
GFR, Estimated: 56 mL/min — ABNORMAL LOW (ref >60)
Glucose, Bld: 176 mg/dL — ABNORMAL HIGH (ref 70–99)
Potassium: 3.5 mmol/L (ref 3.5–5.1)
Sodium: 143 mmol/L (ref 135–145)

## 2022-01-02 LAB — PROCALCITONIN: Procalcitonin: 0.37 ng/mL

## 2022-01-02 LAB — GLUCOSE, CAPILLARY
Glucose-Capillary: 153 mg/dL — ABNORMAL HIGH (ref 70–99)
Glucose-Capillary: 155 mg/dL — ABNORMAL HIGH (ref 70–99)
Glucose-Capillary: 108 mg/dL — ABNORMAL HIGH (ref 70–99)
Glucose-Capillary: 180 mg/dL — ABNORMAL HIGH (ref 70–99)
Glucose-Capillary: 127 mg/dL — ABNORMAL HIGH (ref 70–99)

## 2022-01-02 LAB — HEMOGLOBIN A1C
Hgb A1c MFr Bld: 6 % — ABNORMAL HIGH (ref 4.8–5.6)
Mean Plasma Glucose: 125.5 mg/dL

## 2022-01-02 LAB — ECHOCARDIOGRAM COMPLETE
Height: 65 in
S' Lateral: 4.9 cm
Weight: 3333.36 oz

## 2022-01-02 LAB — SARS CORONAVIRUS 2 BY RT PCR: SARS Coronavirus 2 by RT PCR: NEGATIVE

## 2022-01-02 MED ORDER — PERFLUTREN LIPID MICROSPHERE
1.0000 mL | INTRAVENOUS | Status: AC | PRN
  Administered 2022-01-02: 3 mL via INTRAVENOUS

## 2022-01-02 MED ORDER — PREDNISONE 20 MG PO TABS
40.0000 mg | ORAL_TABLET | Freq: Every day | ORAL | Status: DC
  Administered 2022-01-03 – 2022-01-06 (×4): 40 mg via ORAL
  Filled 2022-01-02 (×4): qty 2

## 2022-01-02 MED ORDER — ESCITALOPRAM OXALATE 10 MG PO TABS
5.0000 mg | ORAL_TABLET | Freq: Every day | ORAL | Status: DC
  Administered 2022-01-02 – 2022-01-06 (×5): 5 mg via ORAL
  Filled 2022-01-02 (×5): qty 1

## 2022-01-02 MED ORDER — MIRTAZAPINE 15 MG PO TABS
15.0000 mg | ORAL_TABLET | Freq: Every day | ORAL | Status: DC
  Administered 2022-01-02 – 2022-01-05 (×4): 15 mg via ORAL
  Filled 2022-01-02 (×5): qty 1

## 2022-01-02 MED ORDER — DOXYCYCLINE HYCLATE 100 MG PO TABS
100.0000 mg | ORAL_TABLET | Freq: Two times a day (BID) | ORAL | Status: DC
  Administered 2022-01-02 – 2022-01-06 (×9): 100 mg via ORAL
  Filled 2022-01-02 (×9): qty 1

## 2022-01-02 NOTE — Plan of Care (Signed)
  Problem: Education: Goal: Knowledge of General Education information will improve Description: Including pain rating scale, medication(s)/side effects and non-pharmacologic comfort measures Outcome: Progressing   Problem: Health Behavior/Discharge Planning: Goal: Ability to manage health-related needs will improve Outcome: Progressing   Problem: Clinical Measurements: Goal: Respiratory complications will improve Outcome: Progressing   Problem: Clinical Measurements: Goal: Cardiovascular complication will be avoided Outcome: Progressing   Problem: Activity: Goal: Risk for activity intolerance will decrease Outcome: Progressing   Problem: Coping: Goal: Level of anxiety will decrease Outcome: Progressing   Problem: Safety: Goal: Ability to remain free from injury will improve Outcome: Progressing   Problem: Pain Managment: Goal: General experience of comfort will improve Outcome: Progressing   Problem: Cardiac: Goal: Ability to achieve and maintain adequate cardiopulmonary perfusion will improve Outcome: Progressing

## 2022-01-02 NOTE — Consult Note (Signed)
NAME:  Charlene Houston, MRN:  [id number], DOB:  1945/06/26, LOS: 1 ADMISSION DATE:  01/01/2022, CONSULTATION DATE:  01/02/22 REFERRING MD:  Dr Starla Link, CHIEF COMPLAINT:  SOB   History of Present Illness:  Patient came in with worsening shortness of breath  Recently treated with course of steroids for pneumonitis felt to be related to [facility] Shortness of breath, cough, sputum production-sputum is clear, may have had a mild fever Recently had lumpectomy, non-small cell lung cancer of the left lung s/p chemoradiation  Started on steroids, required BiPAP, elevated BNP at 2400 CT with atelectasis and bilateral pleural effusions  Pertinent  Medical History  Admitted 922 for shortness of breath  Significant Hospital Events: Including procedures, antibiotic start and stop dates in addition to other pertinent events   CT scan 9/22-atelectasis left base, bilateral pleural effusion  Interim History / Subjective:  She feels a little bit better since being in the hospital Blood pressure controlled, shortness of breath is better She still coughing with clear phlegm  Objective   Blood pressure (!) 103/52, pulse 85, temperature 99.4 F (37.4 C), temperature source Axillary, resp. rate (!) 21, height 5\' 5"  (1.651 m), weight 94.5 kg, SpO2 98 %.    FiO2 (%):  [2.5 %-30 %] 2.5 %   Intake/Output Summary (Last 24 hours) at 01/02/2022 1206 Last data filed at 01/02/2022 0200 Gross per 24 hour  Intake 0 ml  Output 900 ml  Net -900 ml   Filed Weights   01/01/22 1630 01/01/22 2316 01/02/22 0500  Weight: 90.7 kg 95.1 kg 94.5 kg    Examination: General: Elderly lady does not appear to be in distress, chronically ill-appearing HENT: Moist oral mucosa Lungs: Decreased air movement bilaterally with rales at the bases bilaterally Cardiovascular: S1-S2 appreciated Abdomen: Soft, bowel sounds appreciated Extremities: Mild peripheral edema Neuro: Awake alert interactive GU: Diuresing  well  Resolved Hospital Problem list     Assessment & Plan:  Due to drug-induced pneumonitis -Patient was recently on steroids  Cough, increased sputum production Did have a mild unmeasured fever Worsening findings at bases of the lungs -Empirically cover with antibiotics -Obtain procalcitonin  Symptoms not consistent with an overwhelming infection, due to immunocompromise, risk of resistant organisms does appear low  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 -Diuresis  Started on steroids Continue cautious diuresis  Acute kidney injury -Continue to monitor closely  History of sleep apnea -Continue CPAP use at night  Class I obesity  Hypertension -Continue to monitor  Resume prednisone Empiric coverage with Doxy Obtain procalcitonin  Best Practice (right click and "Reselect all SmartList Selections" daily)   Per primary  Labs   CBC: Recent Labs  Lab 01/01/22 1643 01/01/22 1722  WBC 7.9  --   NEUTROABS 6.8  --   HGB 12.7 13.6  HCT 40.8 40.0  MCV 102.0*  --   PLT 168  --     Basic Metabolic Panel: Recent Labs  Lab 01/01/22 1643 01/01/22 1722 01/02/22 0346  NA 140 141 143  K 4.0 3.8 3.5  CL 98  --  99  CO2 29  --  34*  GLUCOSE 170*  --  176*  BUN 15  --  14  CREATININE 1.23*  --  1.04*  CALCIUM 8.9  --  8.6*  MG 2.1  --   --    GFR: Estimated Creatinine Clearance: 53.1 mL/min (A) (by C-G formula based on SCr of 1.04 mg/dL (H)). Recent Labs  Lab 01/01/22  1643  WBC 7.9    Liver Function Tests: Recent Labs  Lab 01/01/22 1643  AST 15  ALT 9  ALKPHOS 47  BILITOT 1.0  PROT 6.5  ALBUMIN 3.3*   No results for input(s): "LIPASE", "AMYLASE" in the last 168 hours. No results for input(s): "AMMONIA" in the last 168 hours.  ABG    Component Value Date/Time   HCO3 35.3 (H) 01/01/2022 1722   TCO2 37 (H) 01/01/2022 1722   O2SAT 86 01/01/2022 1722     Coagulation Profile: No results for input(s): "INR",  "PROTIME" in the last 168 hours.  Cardiac Enzymes: No results for input(s): "CKTOTAL", "CKMB", "CKMBINDEX", "TROPONINI" in the last 168 hours.  HbA1C: Hgb A1c MFr Bld  Date/Time Value Ref Range Status  01/02/2022 03:46 AM 6.0 (H) 4.8 - 5.6 % Final    Comment:    (NOTE) Pre diabetes:          5.7%-6.4%  Diabetes:              >6.4%  Glycemic control for   <7.0% adults with diabetes   04/22/2020 11:41 PM 5.2 4.8 - 5.6 % Final    Comment:    (NOTE) Pre diabetes:          5.7%-6.4%  Diabetes:              >6.4%  Glycemic control for   <7.0% adults with diabetes     CBG: Recent Labs  Lab 01/01/22 2335 01/02/22 0617 01/02/22 0756 01/02/22 1155  GLUCAP 204* 153* 155* 108*    Review of Systems:   Better overall  Past Medical History:  She,  has a past medical history of Arthritis, Asthma, Breast cancer ([place]), Diabetes mellitus without complication ([place]), Hypertension, and SBO (small bowel obstruction) ([place]) (06/2018).   Surgical History:   Past Surgical History:  Procedure Laterality Date   ABDOMINAL HYSTERECTOMY     THUMB AMPUTATION Left    PARTIAL   TONSILLECTOMY       Social History:   reports that she quit smoking about 28 years ago. Her smoking use included cigarettes. She has never used smokeless tobacco. She reports that she does not currently use alcohol. She reports that she does not currently use drugs.   Family History:  Her family history includes Cancer in her father; Diabetes in her mother; Hypertension in her mother.   Allergies Allergies  Allergen Reactions   Prozac [Fluoxetine Hcl] Other (See Comments)    Gittery   Vit D-Vit E-Safflower Oil Other (See Comments)    Not specified    Sherrilyn Rist, MD [place] [facility] Pager: See Shea Evans

## 2022-01-02 NOTE — Progress Notes (Addendum)
PROGRESS NOTE    Charlene Houston  WUJ:[id number] DOB: 08/19/1945 DOA: 01/01/2022 PCP: Lilian Coma., MD   Brief Narrative:  76 y.o. female with medical history significant of breast cancer s/p right lumepctomy status post chemo/XRT, NSCLC of left lung with Beryle Flock currently on hold because of concerns for drug-induced pneumonitis currently being treated as an outpatient with course of steroid taper followed by higher dose of prednisone from 12/24/2021 onwards, type 2 diabetes, hypertension, hyperlipidemia, OSA on CPAP presented with worsening shortness of breath.  She is in the process of getting supplemental oxygen at home.  Her Lasix dose was also recently increased because of worsening leg swelling.  On presentation, she was tachycardic and tachypneic and required BiPAP on presentation.  BNP was 2425.8. CTA chest was negative for PE although segmental and subsegmental branches limited due to respiratory motion artifact.  New bilateral small pleural effusion and compressive atelectasis.  Multiple bilateral pulmonary nodules concerning for metastatic disease similar to prior.  She was given IV Lasix.  Assessment & Plan:   Acute respiratory failure with hypoxia -Presented with hypoxia requiring BiPAP on presentation.  Unclear if this is from combination of worsening of her pneumonitis caused by [facility] versus new onset CHF.  Patient has been on high doses of prednisone recently as an outpatient:?  Fluid retention secondary to prednisone as well.  Currently steroids on hold.  I have consulted pulmonary. -Wean off oxygen as able. -Continue 40 mg IV Lasix daily.  Check 2D echo.  Strict input output.  Daily weights.  Acute kidney injury -Creatinine 1.23 from prior 0.95.  Improving to 1.04.  Monitor  Non-small cell lung cancer -Follows with [facility] oncology.  Keytruda on hold recently for concerns for pneumonitis.  Sleep apnea -Wears CPAP at night at home  Obesity -Outpatient  follow-up  Diabetes mellitus type 2 with hyperglycemia -A1c 6.  Continue CBGs with SSI  Essential hypertension -Monitor pressure.  Intermittently on lower side.  Continue on IV Lasix  DVT prophylaxis: Lovenox Code Status: Full Family Communication: None at bedside Disposition Plan: Status is: Inpatient Remains inpatient appropriate because: Of severity of illness  Consultants: Pulmonary  Procedures: Echo pending  Antimicrobials: None   Subjective: Patient seen and examined at bedside.  Breathing slightly better but still extremely short of breath with exertion.  Denies any current chest pain, fever or vomiting.  Objective: Vitals:   01/01/22 2310 01/01/22 2316 01/02/22 0456 01/02/22 0500  BP:  129/74 (!) 103/52   Pulse: 78 86 85   Resp: (!) 32 (!) 21 (!) 21   Temp:  99.2 F (37.3 C) 99.4 F (37.4 C)   TempSrc:  Oral Axillary   SpO2: 99% 98% 98%   Weight:  95.1 kg  94.5 kg  Height:        Intake/Output Summary (Last 24 hours) at 01/02/2022 1013 Last data filed at 01/02/2022 0200 Gross per 24 hour  Intake 0 ml  Output 900 ml  Net -900 ml   Filed Weights   01/01/22 1630 01/01/22 2316 01/02/22 0500  Weight: 90.7 kg 95.1 kg 94.5 kg    Examination:  General exam: Appears calm and comfortable.  Elderly female lying in bed.  Required BiPAP overnight Respiratory system: Bilateral decreased breath sounds at bases with scattered crackles, intermittently tachypneic, Cardiovascular system: S1 & S2 heard, Rate controlled Gastrointestinal system: Abdomen is obese, nondistended, soft and nontender. Normal bowel sounds heard. Extremities: No cyanosis, clubbing; lower extremity edema present Central nervous system: Alert and  oriented. No focal neurological deficits. Moving extremities Skin: No rashes, lesions or ulcers Psychiatry: Flat affect.  Not agitated.   Data Reviewed: I have personally reviewed following labs and imaging studies  CBC: Recent Labs  Lab  01/01/22 1643 01/01/22 1722  WBC 7.9  --   NEUTROABS 6.8  --   HGB 12.7 13.6  HCT 40.8 40.0  MCV 102.0*  --   PLT 168  --    Basic Metabolic Panel: Recent Labs  Lab 01/01/22 1643 01/01/22 1722 01/02/22 0346  NA 140 141 143  K 4.0 3.8 3.5  CL 98  --  99  CO2 29  --  34*  GLUCOSE 170*  --  176*  BUN 15  --  14  CREATININE 1.23*  --  1.04*  CALCIUM 8.9  --  8.6*  MG 2.1  --   --    GFR: Estimated Creatinine Clearance: 53.1 mL/min (A) (by C-G formula based on SCr of 1.04 mg/dL (H)). Liver Function Tests: Recent Labs  Lab 01/01/22 1643  AST 15  ALT 9  ALKPHOS 47  BILITOT 1.0  PROT 6.5  ALBUMIN 3.3*   No results for input(s): "LIPASE", "AMYLASE" in the last 168 hours. No results for input(s): "AMMONIA" in the last 168 hours. Coagulation Profile: No results for input(s): "INR", "PROTIME" in the last 168 hours. Cardiac Enzymes: No results for input(s): "CKTOTAL", "CKMB", "CKMBINDEX", "TROPONINI" in the last 168 hours. BNP (last 3 results) No results for input(s): "PROBNP" in the last 8760 hours. HbA1C: Recent Labs    01/02/22 0346  HGBA1C 6.0*   CBG: Recent Labs  Lab 01/01/22 2335 01/02/22 0617 01/02/22 0756  GLUCAP 204* 153* 155*   Lipid Profile: No results for input(s): "CHOL", "HDL", "LDLCALC", "TRIG", "CHOLHDL", "LDLDIRECT" in the last 72 hours. Thyroid Function Tests: No results for input(s): "TSH", "T4TOTAL", "FREET4", "T3FREE", "THYROIDAB" in the last 72 hours. Anemia Panel: No results for input(s): "VITAMINB12", "FOLATE", "FERRITIN", "TIBC", "IRON", "RETICCTPCT" in the last 72 hours. Sepsis Labs: No results for input(s): "PROCALCITON", "LATICACIDVEN" in the last 168 hours.  Recent Results (from the past 240 hour(s))  SARS Coronavirus 2 by RT PCR (hospital order, performed in [facility] hospital lab) *cepheid single result test* Anterior Nasal Swab     Status: None   Collection Time: 01/01/22 11:50 PM   Specimen: Anterior Nasal Swab  Result  Value Ref Range Status   SARS Coronavirus 2 by RT PCR NEGATIVE NEGATIVE Final    Comment: (NOTE) SARS-CoV-2 target nucleic acids are NOT DETECTED.  The SARS-CoV-2 RNA is generally detectable in upper and lower respiratory specimens during the acute phase of infection. The lowest concentration of SARS-CoV-2 viral copies this assay can detect is 250 copies / mL. A negative result does not preclude SARS-CoV-2 infection and should not be used as the sole basis for treatment or other patient management decisions.  A negative result may occur with improper specimen collection / handling, submission of specimen other than nasopharyngeal swab, presence of viral mutation(s) within the areas targeted by this assay, and inadequate number of viral copies (<250 copies / mL). A negative result must be combined with clinical observations, patient history, and epidemiological information.  Fact Sheet for Patients:   https://www.patel.info/  Fact Sheet for Healthcare Providers: https://hall.com/  This test is not yet approved or  cleared by the [place] FDA and has been authorized for detection and/or diagnosis of SARS-CoV-2 by FDA under an Emergency Use Authorization (EUA).  This EUA  will remain in effect (meaning this test can be used) for the duration of the COVID-19 declaration under Section 564(b)(1) of the Act, 21 U.S.C. section 360bbb-3(b)(1), unless the authorization is terminated or revoked sooner.  Performed at [facility], [place] [address] [place], [place] [address]          Radiology Studies: CT Angio Chest PE W and/or Wo Contrast  Result Date: 01/01/2022 CLINICAL DATA:  Respiratory distress starting 1 hour ago diminished lung sounds; PE suspected high probability EXAM: CT ANGIOGRAPHY CHEST WITH CONTRAST TECHNIQUE: Multidetector CT imaging of the chest was performed using the standard protocol during bolus administration of  intravenous contrast. Multiplanar CT image reconstructions and MIPs were obtained to evaluate the vascular anatomy. RADIATION DOSE REDUCTION: This exam was performed according to the departmental dose-optimization program which includes automated exposure control, adjustment of the mA and/or kV according to patient size and/or use of iterative reconstruction technique. CONTRAST:  28mL OMNIPAQUE IOHEXOL 350 MG/ML SOLN COMPARISON:  Radiographs earlier today and CT chest 06/10/2021 FINDINGS: Cardiovascular: Satisfactory opacification of the central pulmonary arteries. Evaluation of the segmental and subsegmental branches is limited due to respiratory motion artifact. No pulmonary embolism identified. Cardiomegaly. Coronary artery calcification. Aortic calcification. No pericardial effusion. Right chest wall Port-A-Cath. Mediastinum/Nodes: No enlarged mediastinal, hilar, or axillary lymph nodes. Thyroid gland, trachea, and esophagus demonstrate no significant findings. Lungs/Pleura: New small bilateral pleural effusions and compressive atelectasis. Numerous pulmonary nodules are redemonstrated again measuring up to 1.3 cm in the medial right lower lobe (series 7/image 69). Similar patchy ground-glass opacities bilaterally which are nonspecific and may be due to edema, infection, or metastatic disease. Upper Abdomen: No acute abnormality. Musculoskeletal: No chest wall abnormality. No acute osseous findings. Surgical clips right breast. Review of the MIP images confirms the above findings. IMPRESSION: 1. No CT evidence of pulmonary embolus. 2. New small bilateral pleural effusions and compressive atelectasis. 3. Multiple bilateral pulmonary nodules concerning for metastatic disease grossly similar to 06/10/2021. 4. Cardiomegaly and coronary artery vascular calcification. Electronically Signed   By: Placido Sou M.D.   On: 01/01/2022 20:10   DG Chest Port 1 View  Result Date: 01/01/2022 CLINICAL DATA:  Shortness of  breath EXAM: PORTABLE CHEST 1 VIEW COMPARISON:  Chest x-ray 07/16/2021 FINDINGS: Right chest port catheter tip projects over the distal SVC. The heart is enlarged, unchanged. There are minimal bibasilar opacities favored is atelectasis. No pleural effusion or pneumothorax. No acute fractures. IMPRESSION: 1. Minimal bibasilar atelectasis. 2. Stable cardiomegaly. Electronically Signed   By: Ronney Asters M.D.   On: 01/01/2022 17:44        Scheduled Meds:  enoxaparin (LOVENOX) injection  40 mg Subcutaneous QHS   furosemide  40 mg Intravenous Daily   insulin aspart  0-9 Units Subcutaneous TID WC   Continuous Infusions:        Aline August, MD [facility] 01/02/2022, 10:13 AM

## 2022-01-02 NOTE — Plan of Care (Signed)
  Problem: Education: Goal: Knowledge of General Education information will improve Description: Including pain rating scale, medication(s)/side effects and non-pharmacologic comfort measures 01/02/2022 2153 by Kaylyn Lim, RN Outcome: Progressing 01/02/2022 2153 by Kaylyn Lim, RN Outcome: Progressing   Problem: Health Behavior/Discharge Planning: Goal: Ability to manage health-related needs will improve 01/02/2022 2153 by Kaylyn Lim, RN Outcome: Progressing 01/02/2022 2153 by Kaylyn Lim, RN Outcome: Progressing   Problem: Clinical Measurements: Goal: Respiratory complications will improve 01/02/2022 2153 by Kaylyn Lim, RN Outcome: Progressing 01/02/2022 2153 by Kaylyn Lim, RN Outcome: Progressing   Problem: Clinical Measurements: Goal: Cardiovascular complication will be avoided 01/02/2022 2153 by Kaylyn Lim, RN Outcome: Progressing 01/02/2022 2153 by Kaylyn Lim, RN Outcome: Progressing   Problem: Activity: Goal: Risk for activity intolerance will decrease 01/02/2022 2153 by Kaylyn Lim, RN Outcome: Progressing 01/02/2022 2153 by Kaylyn Lim, RN Outcome: Progressing   Problem: Safety: Goal: Ability to remain free from injury will improve 01/02/2022 2153 by Kaylyn Lim, RN Outcome: Progressing 01/02/2022 2153 by Kaylyn Lim, RN Outcome: Progressing   Problem: Cardiac: Goal: Ability to achieve and maintain adequate cardiopulmonary perfusion will improve 01/02/2022 2153 by Kaylyn Lim, RN Outcome: Progressing 01/02/2022 2153 by Kaylyn Lim, RN Outcome: Progressing

## 2022-01-02 NOTE — Progress Notes (Signed)
  Echocardiogram 2D Echocardiogram has been performed.  Johny Chess 01/02/2022, 10:58 AM

## 2022-01-03 DIAGNOSIS — I428 Other cardiomyopathies: Secondary | ICD-10-CM | POA: Diagnosis not present

## 2022-01-03 DIAGNOSIS — I5043 Acute on chronic combined systolic (congestive) and diastolic (congestive) heart failure: Secondary | ICD-10-CM

## 2022-01-03 DIAGNOSIS — J9601 Acute respiratory failure with hypoxia: Secondary | ICD-10-CM | POA: Diagnosis not present

## 2022-01-03 DIAGNOSIS — I10 Essential (primary) hypertension: Secondary | ICD-10-CM | POA: Diagnosis not present

## 2022-01-03 DIAGNOSIS — I5041 Acute combined systolic (congestive) and diastolic (congestive) heart failure: Secondary | ICD-10-CM

## 2022-01-03 DIAGNOSIS — N179 Acute kidney failure, unspecified: Secondary | ICD-10-CM | POA: Diagnosis not present

## 2022-01-03 DIAGNOSIS — C3490 Malignant neoplasm of unspecified part of unspecified bronchus or lung: Secondary | ICD-10-CM | POA: Diagnosis not present

## 2022-01-03 LAB — BASIC METABOLIC PANEL
Anion gap: 10 (ref 5–15)
Anion gap: 10 (ref 5–15)
BUN: 15 mg/dL (ref 8–23)
BUN: 18 mg/dL (ref 8–23)
CO2: 37 mmol/L — ABNORMAL HIGH (ref 22–32)
CO2: 34 mmol/L — ABNORMAL HIGH (ref 22–32)
Calcium: 8.6 mg/dL — ABNORMAL LOW (ref 8.9–10.3)
Calcium: 8.7 mg/dL — ABNORMAL LOW (ref 8.9–10.3)
Chloride: 96 mmol/L — ABNORMAL LOW (ref 98–111)
Chloride: 96 mmol/L — ABNORMAL LOW (ref 98–111)
Creatinine, Ser: 0.95 mg/dL (ref 0.44–1.00)
Creatinine, Ser: 0.88 mg/dL (ref 0.44–1.00)
GFR, Estimated: >60 mL/min (ref >60)
GFR, Estimated: >60 mL/min (ref >60)
Glucose, Bld: 101 mg/dL — ABNORMAL HIGH (ref 70–99)
Glucose, Bld: 223 mg/dL — ABNORMAL HIGH (ref 70–99)
Potassium: 2.7 mmol/L — CL (ref 3.5–5.1)
Potassium: 4.3 mmol/L (ref 3.5–5.1)
Sodium: 143 mmol/L (ref 135–145)
Sodium: 140 mmol/L (ref 135–145)

## 2022-01-03 LAB — CBC WITH DIFFERENTIAL/PLATELET
Abs Immature Granulocytes: 0.04 10*3/uL (ref 0.00–0.07)
Basophils Absolute: 0 10*3/uL (ref 0.0–0.1)
Basophils Relative: 0 %
Eosinophils Absolute: 0 10*3/uL (ref 0.0–0.5)
Eosinophils Relative: 0 %
HCT: 38.4 % (ref 36.0–46.0)
Hemoglobin: 11.9 g/dL — ABNORMAL LOW (ref 12.0–15.0)
Immature Granulocytes: 0 %
Lymphocytes Relative: 10 %
Lymphs Abs: 1 10*3/uL (ref 0.7–4.0)
MCH: 31.4 pg (ref 26.0–34.0)
MCHC: 31 g/dL (ref 30.0–36.0)
MCV: 101.3 fL — ABNORMAL HIGH (ref 80.0–100.0)
Monocytes Absolute: 0.5 10*3/uL (ref 0.1–1.0)
Monocytes Relative: 5 %
Neutro Abs: 8.2 10*3/uL — ABNORMAL HIGH (ref 1.7–7.7)
Neutrophils Relative %: 85 %
Platelets: 157 10*3/uL (ref 150–400)
RBC: 3.79 MIL/uL — ABNORMAL LOW (ref 3.87–5.11)
RDW: 13.3 % (ref 11.5–15.5)
WBC: 9.8 10*3/uL (ref 4.0–10.5)
nRBC: 0 % (ref 0.0–0.2)

## 2022-01-03 LAB — GLUCOSE, CAPILLARY
Glucose-Capillary: 95 mg/dL (ref 70–99)
Glucose-Capillary: 120 mg/dL — ABNORMAL HIGH (ref 70–99)
Glucose-Capillary: 207 mg/dL — ABNORMAL HIGH (ref 70–99)
Glucose-Capillary: 151 mg/dL — ABNORMAL HIGH (ref 70–99)

## 2022-01-03 LAB — MAGNESIUM: Magnesium: 1.9 mg/dL (ref 1.7–2.4)

## 2022-01-03 MED ORDER — POTASSIUM CHLORIDE CRYS ER 20 MEQ PO TBCR
40.0000 meq | EXTENDED_RELEASE_TABLET | Freq: Once | ORAL | Status: AC
  Administered 2022-01-03: 40 meq via ORAL
  Filled 2022-01-03: qty 2

## 2022-01-03 MED ORDER — DAPAGLIFLOZIN PROPANEDIOL 10 MG PO TABS
10.0000 mg | ORAL_TABLET | Freq: Every day | ORAL | Status: DC
  Administered 2022-01-03 – 2022-01-04 (×2): 10 mg via ORAL
  Filled 2022-01-03 (×2): qty 1

## 2022-01-03 MED ORDER — FUROSEMIDE 10 MG/ML IJ SOLN
40.0000 mg | Freq: Two times a day (BID) | INTRAMUSCULAR | Status: DC
  Administered 2022-01-03 – 2022-01-05 (×5): 40 mg via INTRAVENOUS
  Filled 2022-01-03 (×5): qty 4

## 2022-01-03 MED ORDER — POTASSIUM CHLORIDE 10 MEQ/100ML IV SOLN
10.0000 meq | INTRAVENOUS | Status: DC
  Administered 2022-01-03: 10 meq via INTRAVENOUS
  Filled 2022-01-03 (×2): qty 100

## 2022-01-03 MED ORDER — LOSARTAN POTASSIUM 25 MG PO TABS
25.0000 mg | ORAL_TABLET | Freq: Every day | ORAL | Status: DC
  Administered 2022-01-03 – 2022-01-06 (×4): 25 mg via ORAL
  Filled 2022-01-03 (×4): qty 1

## 2022-01-03 NOTE — Progress Notes (Signed)
PROGRESS NOTE    Charlene Houston  RCB:[id number] DOB: 02-Feb-1946 DOA: 01/01/2022 PCP: Lilian Coma., MD   Brief Narrative:  76 y.o. female with medical history significant of breast cancer s/p right lumepctomy status post chemo/XRT, NSCLC of left lung with Beryle Flock currently on hold because of concerns for drug-induced pneumonitis currently being treated as an outpatient with course of steroid taper followed by higher dose of prednisone from 12/24/2021 onwards, type 2 diabetes, hypertension, hyperlipidemia, OSA on CPAP presented with worsening shortness of breath.  She is in the process of getting supplemental oxygen at home.  Her Lasix dose was also recently increased because of worsening leg swelling.  On presentation, she was tachycardic and tachypneic and required BiPAP on presentation.  BNP was 2425.8. CTA chest was negative for PE although segmental and subsegmental branches limited due to respiratory motion artifact.  New bilateral small pleural effusion and compressive atelectasis.  Multiple bilateral pulmonary nodules concerning for metastatic disease similar to prior.  She was given IV Lasix.  Pulmonary was consulted.  Assessment & Plan:   Acute respiratory failure with hypoxia Possible acute systolic and diastolic heart failure Drug-induced pneumonitis -Presented with hypoxia requiring BiPAP on presentation.  Possibly from combination of worsening of her pneumonitis caused by [facility] along with new onset CHF.  -Pulmonary following and currently on prednisone 40 mg daily along with oral doxycycline. -Still required BiPAP overnight. -Continue 40 mg IV Lasix daily.  Echo showed EF of 35 to 40% with grade 1 diastolic dysfunction.  Consult cardiology.  Strict input output.  Daily weights.  Fluid restriction.  Acute kidney injury -Creatinine 1.23 from prior 0.95.  Improving to 0.95.  Monitor  Hypokalemia -Replace.  Repeat a.m. labs  Non-small cell lung cancer -Follows with  [facility] oncology.  Keytruda on hold recently for concerns for pneumonitis.  Sleep apnea -Wears CPAP at night at home  Obesity -Outpatient follow-up  Diabetes mellitus type 2 with hyperglycemia -A1c 6.  Continue CBGs with SSI  Essential hypertension -Monitor pressure.  Intermittently on lower side.  Continue on IV Lasix  DVT prophylaxis: Lovenox Code Status: Full Family Communication: None at bedside Disposition Plan: Status is: Inpatient Remains inpatient appropriate because: Of severity of illness  Consultants: Pulmonary/cardiology  Procedures: Echo  Antimicrobials: Doxycycline from 01/02/2022 onwards   Subjective: Patient seen and examined at bedside.  No fever, chest pain, worsening abdominal pain or vomiting reported.  Breathing is slightly better but still short of breath with exertion.   Objective: Vitals:   01/02/22 2033 01/03/22 0041 01/03/22 0500 01/03/22 0549  BP: 121/68 111/65  132/70  Pulse: 85 78  70  Resp: (!) 21 19  20   Temp: 98.1 F (36.7 C) 97.8 F (36.6 C)  97.8 F (36.6 C)  TempSrc: Oral Axillary  Axillary  SpO2: 95% 97%  97%  Weight:   94.7 kg   Height:        Intake/Output Summary (Last 24 hours) at 01/03/2022 0802 Last data filed at 01/03/2022 0552 Gross per 24 hour  Intake --  Output 1750 ml  Net -1750 ml    Filed Weights   01/01/22 2316 01/02/22 0500 01/03/22 0500  Weight: 95.1 kg 94.5 kg 94.7 kg    Examination:  General: Required BiPAP again last night.  No distress ENT/neck: No thyromegaly.  JVD is not elevated  respiratory: Decreased breath sounds at bases bilaterally with some crackles; no wheezing.  Intermittently tachypneic  CVS: S1-S2 heard, rate controlled currently Abdominal: Soft, obese, nontender,  slightly distended; no organomegaly, bowel sounds are heard Extremities: Lower extremity edema present bilaterally; no cyanosis  CNS: Awake and alert.  No focal neurologic deficit.  Moves extremities Lymph: No obvious  lymphadenopathy Skin: No obvious ecchymosis/lesions  psych: No agitation noted; affect is flat  musculoskeletal: No obvious joint swelling/deformity    Data Reviewed: I have personally reviewed following labs and imaging studies  CBC: Recent Labs  Lab 01/01/22 1643 01/01/22 1722 01/03/22 0448  WBC 7.9  --  9.8  NEUTROABS 6.8  --  8.2*  HGB 12.7 13.6 11.9*  HCT 40.8 40.0 38.4  MCV 102.0*  --  101.3*  PLT 168  --  786    Basic Metabolic Panel: Recent Labs  Lab 01/01/22 1643 01/01/22 1722 01/02/22 0346 01/03/22 0448  NA 140 141 143 143  K 4.0 3.8 3.5 2.7*  CL 98  --  99 96*  CO2 29  --  34* 37*  GLUCOSE 170*  --  176* 101*  BUN 15  --  14 15  CREATININE 1.23*  --  1.04* 0.95  CALCIUM 8.9  --  8.6* 8.6*  MG 2.1  --   --  1.9    GFR: Estimated Creatinine Clearance: 58.2 mL/min (by C-G formula based on SCr of 0.95 mg/dL). Liver Function Tests: Recent Labs  Lab 01/01/22 1643  AST 15  ALT 9  ALKPHOS 47  BILITOT 1.0  PROT 6.5  ALBUMIN 3.3*    No results for input(s): "LIPASE", "AMYLASE" in the last 168 hours. No results for input(s): "AMMONIA" in the last 168 hours. Coagulation Profile: No results for input(s): "INR", "PROTIME" in the last 168 hours. Cardiac Enzymes: No results for input(s): "CKTOTAL", "CKMB", "CKMBINDEX", "TROPONINI" in the last 168 hours. BNP (last 3 results) No results for input(s): "PROBNP" in the last 8760 hours. HbA1C: Recent Labs    01/02/22 0346  HGBA1C 6.0*    CBG: Recent Labs  Lab 01/02/22 0617 01/02/22 0756 01/02/22 1155 01/02/22 1623 01/02/22 2035  GLUCAP 153* 155* 108* 180* 127*    Lipid Profile: No results for input(s): "CHOL", "HDL", "LDLCALC", "TRIG", "CHOLHDL", "LDLDIRECT" in the last 72 hours. Thyroid Function Tests: No results for input(s): "TSH", "T4TOTAL", "FREET4", "T3FREE", "THYROIDAB" in the last 72 hours. Anemia Panel: No results for input(s): "VITAMINB12", "FOLATE", "FERRITIN", "TIBC", "IRON",  "RETICCTPCT" in the last 72 hours. Sepsis Labs: Recent Labs  Lab 01/02/22 0346  PROCALCITON 0.37    Recent Results (from the past 240 hour(s))  SARS Coronavirus 2 by RT PCR (hospital order, performed in [facility] hospital lab) *cepheid single result test* Anterior Nasal Swab     Status: None   Collection Time: 01/01/22 11:50 PM   Specimen: Anterior Nasal Swab  Result Value Ref Range Status   SARS Coronavirus 2 by RT PCR NEGATIVE NEGATIVE Final    Comment: (NOTE) SARS-CoV-2 target nucleic acids are NOT DETECTED.  The SARS-CoV-2 RNA is generally detectable in upper and lower respiratory specimens during the acute phase of infection. The lowest concentration of SARS-CoV-2 viral copies this assay can detect is 250 copies / mL. A negative result does not preclude SARS-CoV-2 infection and should not be used as the sole basis for treatment or other patient management decisions.  A negative result may occur with improper specimen collection / handling, submission of specimen other than nasopharyngeal swab, presence of viral mutation(s) within the areas targeted by this assay, and inadequate number of viral copies (<250 copies / mL). A negative result must  be combined with clinical observations, patient history, and epidemiological information.  Fact Sheet for Patients:   https://www.patel.info/  Fact Sheet for Healthcare Providers: https://hall.com/  This test is not yet approved or  cleared by the [place] FDA and has been authorized for detection and/or diagnosis of SARS-CoV-2 by FDA under an Emergency Use Authorization (EUA).  This EUA will remain in effect (meaning this test can be used) for the duration of the COVID-19 declaration under Section 564(b)(1) of the Act, 21 U.S.C. section 360bbb-3(b)(1), unless the authorization is terminated or revoked sooner.  Performed at [facility], [place] [address], [place],  [place] [address]          Radiology Studies: ECHOCARDIOGRAM COMPLETE  Result Date: 01/02/2022    ECHOCARDIOGRAM REPORT   Patient Name:   ANNEMARIE SEBREE Date of Exam: 01/02/2022 Medical Rec #:  [id number]               Height:       65.0 in Accession #:    [id number]              Weight:       208.3 lb Date of Birth:  January 14, 1946               BSA:          2.013 m Patient Age:    36 years                BP:           103/52 mmHg Patient Gender: F                       HR:           89 bpm. Exam Location:  Inpatient Procedure: 2D Echo Indications:    acute systolic chf.  History:        Patient has no prior history of Echocardiogram examinations.                 Risk Factors:Diabetes, Hypertension, Dyslipidemia and Sleep                 Apnea.  Sonographer:    Johny Chess RDCS Referring Phys: [id number] [place] T T[facility]  Sonographer Comments: Patient is obese. Image acquisition challenging due to patient body habitus. IMPRESSIONS  1. Left ventricular ejection fraction, by estimation, is 35 to 40%. The left ventricle has moderately decreased function. The left ventricle demonstrates global hypokinesis. The left ventricular internal cavity size was moderately dilated. Left ventricular diastolic parameters are consistent with Grade I diastolic dysfunction (impaired relaxation).  2. Right ventricular systolic function is normal. The right ventricular size is normal.  3. The mitral valve is abnormal. Trivial mitral valve regurgitation. No evidence of mitral stenosis.  4. Nodular calcifiecation of non coronary cusp. The aortic valve is tricuspid. There is moderate calcification of the aortic valve. Aortic valve regurgitation is not visualized. Aortic valve sclerosis is present, with no evidence of aortic valve stenosis.  5. The inferior vena cava is normal in size with greater than 50% respiratory variability, suggesting right atrial pressure of 3 mmHg. FINDINGS  Left Ventricle: Left ventricular ejection fraction,  by estimation, is 35 to 40%. The left ventricle has moderately decreased function. The left ventricle demonstrates global hypokinesis. Definity contrast agent was given IV to delineate the left ventricular endocardial borders. The left ventricular internal cavity size was moderately dilated. There is no left ventricular hypertrophy.  Left ventricular diastolic parameters are consistent with Grade I diastolic dysfunction (impaired relaxation). Right Ventricle: The right ventricular size is normal. No increase in right ventricular wall thickness. Right ventricular systolic function is normal. Left Atrium: Left atrial size was normal in size. Right Atrium: Right atrial size was normal in size. Pericardium: There is no evidence of pericardial effusion. Mitral Valve: The mitral valve is abnormal. There is mild thickening of the mitral valve leaflet(s). Trivial mitral valve regurgitation. No evidence of mitral valve stenosis. Tricuspid Valve: The tricuspid valve is normal in structure. Tricuspid valve regurgitation is mild . No evidence of tricuspid stenosis. Aortic Valve: Nodular calcifiecation of non coronary cusp. The aortic valve is tricuspid. There is moderate calcification of the aortic valve. Aortic valve regurgitation is not visualized. Aortic valve sclerosis is present, with no evidence of aortic valve stenosis. Pulmonic Valve: The pulmonic valve was normal in structure. Pulmonic valve regurgitation is not visualized. No evidence of pulmonic stenosis. Aorta: The aortic root is normal in size and structure. Venous: The inferior vena cava is normal in size with greater than 50% respiratory variability, suggesting right atrial pressure of 3 mmHg. IAS/Shunts: No atrial level shunt detected by color flow Doppler.  LEFT VENTRICLE PLAX 2D LVIDd:         5.50 cm   Diastology LVIDs:         4.90 cm   LV e' medial:    5.55 cm/s LV PW:         1.10 cm   LV E/e' medial:  13.2 LV IVS:        1.10 cm   LV e' lateral:   7.83  cm/s LVOT diam:     2.00 cm   LV E/e' lateral: 9.4 LV SV:         59 LV SV Index:   29 LVOT Area:     3.14 cm  RIGHT VENTRICLE             IVC RV S prime:     12.50 cm/s  IVC diam: 1.70 cm TAPSE (M-mode): 3.2 cm LEFT ATRIUM             Index        RIGHT ATRIUM           Index LA diam:        3.40 cm 1.69 cm/m   RA Area:     18.00 cm LA Vol (A2C):   62.1 ml 30.85 ml/m  RA Volume:   54.40 ml  27.03 ml/m LA Vol (A4C):   52.5 ml 26.08 ml/m LA Biplane Vol: 58.0 ml 28.82 ml/m  AORTIC VALVE LVOT Vmax:   101.00 cm/s LVOT Vmean:  67.600 cm/s LVOT VTI:    0.187 m  AORTA Ao Root diam: 2.80 cm Ao Asc diam:  3.20 cm MV E velocity: 73.30 cm/s MV A velocity: 117.00 cm/s  SHUNTS MV E/A ratio:  0.63         Systemic VTI:  0.19 m                             Systemic Diam: 2.00 cm Jenkins Rouge MD Electronically signed by Jenkins Rouge MD Signature Date/Time: 01/02/2022/11:51:18 AM    Final    CT Angio Chest PE W and/or Wo Contrast  Result Date: 01/01/2022 CLINICAL DATA:  Respiratory distress starting 1 hour ago diminished lung sounds; PE suspected high probability EXAM: CT ANGIOGRAPHY  CHEST WITH CONTRAST TECHNIQUE: Multidetector CT imaging of the chest was performed using the standard protocol during bolus administration of intravenous contrast. Multiplanar CT image reconstructions and MIPs were obtained to evaluate the vascular anatomy. RADIATION DOSE REDUCTION: This exam was performed according to the departmental dose-optimization program which includes automated exposure control, adjustment of the mA and/or kV according to patient size and/or use of iterative reconstruction technique. CONTRAST:  33mL OMNIPAQUE IOHEXOL 350 MG/ML SOLN COMPARISON:  Radiographs earlier today and CT chest 06/10/2021 FINDINGS: Cardiovascular: Satisfactory opacification of the central pulmonary arteries. Evaluation of the segmental and subsegmental branches is limited due to respiratory motion artifact. No pulmonary embolism identified.  Cardiomegaly. Coronary artery calcification. Aortic calcification. No pericardial effusion. Right chest wall Port-A-Cath. Mediastinum/Nodes: No enlarged mediastinal, hilar, or axillary lymph nodes. Thyroid gland, trachea, and esophagus demonstrate no significant findings. Lungs/Pleura: New small bilateral pleural effusions and compressive atelectasis. Numerous pulmonary nodules are redemonstrated again measuring up to 1.3 cm in the medial right lower lobe (series 7/image 69). Similar patchy ground-glass opacities bilaterally which are nonspecific and may be due to edema, infection, or metastatic disease. Upper Abdomen: No acute abnormality. Musculoskeletal: No chest wall abnormality. No acute osseous findings. Surgical clips right breast. Review of the MIP images confirms the above findings. IMPRESSION: 1. No CT evidence of pulmonary embolus. 2. New small bilateral pleural effusions and compressive atelectasis. 3. Multiple bilateral pulmonary nodules concerning for metastatic disease grossly similar to 06/10/2021. 4. Cardiomegaly and coronary artery vascular calcification. Electronically Signed   By: Placido Sou M.D.   On: 01/01/2022 20:10   DG Chest Port 1 View  Result Date: 01/01/2022 CLINICAL DATA:  Shortness of breath EXAM: PORTABLE CHEST 1 VIEW COMPARISON:  Chest x-ray 07/16/2021 FINDINGS: Right chest port catheter tip projects over the distal SVC. The heart is enlarged, unchanged. There are minimal bibasilar opacities favored is atelectasis. No pleural effusion or pneumothorax. No acute fractures. IMPRESSION: 1. Minimal bibasilar atelectasis. 2. Stable cardiomegaly. Electronically Signed   By: Ronney Asters M.D.   On: 01/01/2022 17:44        Scheduled Meds:  doxycycline  100 mg Oral Q12H   enoxaparin (LOVENOX) injection  40 mg Subcutaneous QHS   escitalopram  5 mg Oral Daily   furosemide  40 mg Intravenous Daily   insulin aspart  0-9 Units Subcutaneous TID WC   mirtazapine  15 mg Oral QHS    predniSONE  40 mg Oral Q breakfast   Continuous Infusions:  potassium chloride 10 mEq (01/03/22 5809)          Aline August, MD [facility] 01/03/2022, 8:02 AM

## 2022-01-03 NOTE — Progress Notes (Signed)
   NAME:  Charlene Houston, MRN:  [id number], DOB:  Oct 28, 1945, LOS: 2 ADMISSION DATE:  01/01/2022, CONSULTATION DATE:  01/02/22 REFERRING MD:  Dr Starla Link, CHIEF COMPLAINT:  SOB   History of Present Illness:  Patient came in with worsening shortness of breath   Recently treated with course of steroids for pneumonitis felt to be related to [facility] Shortness of breath, cough, sputum production-sputum is clear, may have had a mild fever Recently had lumpectomy, non-small cell lung cancer of the left lung s/p chemoradiation   Started on steroids, required BiPAP, elevated BNP at 2400 CT with atelectasis and bilateral pleural effusions  Pertinent  Medical History   Past Medical History:  Diagnosis Date   Arthritis    Asthma    Breast cancer ([place])    Diabetes mellitus without complication ([place])    Hypertension    SBO (small bowel obstruction) ([place]) 06/2018   Significant Hospital Events: Including procedures, antibiotic start and stop dates in addition to other pertinent events   9/22-CT scan of the chest shows atelectasis left base, bilateral pleural effusions  Interim History / Subjective:  Feels a little better Diuresing Still with clear phlegm, no chest pains or chest discomfort  Objective   Blood pressure (!) 117/59, pulse 77, temperature 98.1 F (36.7 C), temperature source Oral, resp. rate 20, height 5\' 5"  (1.651 m), weight 94.7 kg, SpO2 97 %.        Intake/Output Summary (Last 24 hours) at 01/03/2022 1013 Last data filed at 01/03/2022 1314 Gross per 24 hour  Intake --  Output 1750 ml  Net -1750 ml   Filed Weights   01/01/22 2316 01/02/22 0500 01/03/22 0500  Weight: 95.1 kg 94.5 kg 94.7 kg    Examination: General: Elderly lady, does not appear to be in distress, chronically ill-appearing HENT: Moist oral mucosa Lungs: Decreased air movement bilaterally with rales at the bases Cardiovascular: S1-S2 appreciated with no murmur Abdomen: Soft, bowel sounds  appreciated Extremities: Mild peripheral edema Neuro: Awake and interactive, no focal findings GU: Diuresing well  Resolved Hospital Problem list     Assessment & Plan:  Drug-induced pneumonitis -Restarted on steroids  Cough, increased sputum production Mild fever at home -Empirically on doxycycline -Procalcitonin of 0.37  Symptoms not consistent with an overwhelming infection, risk of resistant organisms level  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

## 2022-01-03 NOTE — Consult Note (Addendum)
Cardiology Consultation   Patient ID: Charlene Houston MRN: [id number]; DOB: [birth year]  Admit date: 01/01/2022 Date of Consult: 01/03/2022  PCP:  Lilian Coma., MD   [place] Providers Cardiologist:  New to [facility] heart care     Patient Profile:   Charlene Houston is a 76 y.o. 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 Starla Link.  History of Present Illness:   Charlene Houston with above past medical history presented to the ER yesterday with chief complaints of shortness of breath.   She has no known cardiac disease in the past per record review.  She is currently suffering non-small cell lung cancer of left lung with metastatic disease to right lung, that was newly diagnosed on 08/05/2020 biopsy, started immunotherapy with Keytruda 10/30/2020 by oncologist Dr. 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   She was last hospitalized here 06/09/2021 to 06/10/2021 due to acute onset of dizziness and unsteady gait.  She was noted with low blood pressure with hypoxia as well as electrolytes imbalance and hypoalbuminemia and hypoglycemia.  She was started on midodrine and meclizine and weaned off oxygen at the time of discharge.  Hypoxia was felt due to underlying lung cancer.  Patient presented to the ER 01/01/2022 reports persistent shortness of breath with exertion.  Reportedly she was seen by [facility] oncology on 12/03/2021 with dyspnea and hypoxia 70%, CTA chest was negative for PE but concerning for pulmonary edema versus pneumonitis.  She was started on steroid tapering with her immunotherapy on hold.  She was last seen on 12/24/2021 at the  oncology office, there has been concern of poorly controlled type 2 diabetes due to concurrent steroid therapy.  She was also noted having some bilateral lower extremity edema and upper extremity edema with recent CTA of chest suggesting pulmonary edema.  Her home Lasix was increased from 20 to 40 mg daily for 1 week.  She reports productive cough and worsened bilateral lower extremity edema that appears progressing to her abdomen and hands.  She had stopped using her CPAP at night due to feeling unwell. She has been urinating a lot with lasix but noted her SOB and leg edema are not better at home. She denied hx of CHF or MI. She denied any family hx of cardiac disease. She denied any chest pain, syncope. She does not weight herself at home. She felt her legs edema is improving but still feels very SOB.   Admission diagnostic care showed elevated creatinine 1.23 with GFR 46, albumin 3.3, glucose 170.  BNP 2425.  High sensitive troponin 30 >29.  CBC differential grossly unremarkable.  COVID-19 negative.  Chest x-ray revealed minimal bibasilar atelectasis. CTA chest showed no evidence of PE, new small bilateral pleural effusion and compressive atelectasis, multiple bilateral pulmonary nodules concerning for metastatic disease grossly similar to 06/10/2021, cardiomegaly with coronary artery vascular calcification.  A1c 6%.  Echocardiogram from 01/02/2022 revealed LVEF 35 to 40%, global hypokinesis, moderately dilated LV, grade 1 DD, normal RV, trivial MR, moderate aortic sclerosis.  She was admitted to hospital medicine service due to acute hypoxic respiratory failure and mild AKI,  concerned for new onset CHF and possible cardiorenal syndrome, was started on IV Lasix 40  mg daily, cardiology consult is requested today for further input.   Past Medical History:  Diagnosis Date   Arthritis    Asthma    Breast cancer ([place])    Diabetes mellitus without complication ([place])    Hypertension    SBO (small bowel  obstruction) ([place]) 06/2018    Past Surgical History:  Procedure Laterality Date   ABDOMINAL HYSTERECTOMY     THUMB AMPUTATION Left    PARTIAL   TONSILLECTOMY       Home Medications:  Prior to Admission medications   Medication Sig Start Date End Date Taking? Authorizing Provider  acetaminophen (TYLENOL) 325 MG tablet Take 650 mg by mouth every 6 (six) hours as needed for mild pain or headache.   Yes [provider]  albuterol (ACCUNEB) 1.25 MG/3ML nebulizer solution Take 3 mLs by nebulization every 6 (six) hours as needed for wheezing. 01/16/21  Yes [provider]  albuterol (PROVENTIL HFA;VENTOLIN HFA) 108 (90 Base) MCG/ACT inhaler Inhale 1-2 puffs into the lungs every 6 (six) hours as needed for wheezing or shortness of breath.   Yes [provider]  carboxymethylcellulose (REFRESH PLUS) 0.5 % SOLN Place 1 drop into both eyes daily as needed (dry eyes).   Yes [provider]  cyanocobalamin (,VITAMIN B-12,) 1000 MCG/ML injection Inject 1,000 mcg into the muscle every 30 (thirty) days.   Yes [provider]  desonide (DESOWEN) 0.05 % cream Apply topically 2 (two) times daily. Patient taking differently: Apply 1 Application topically daily as needed (rash). 09/03/20  Yes Raspet, Derry Skill, PA-C  Ergocalciferol 10 MCG (400 UNIT) TABS Take 800-1,200 Units by mouth See admin instructions. Taking 2 tabs ( 800 units) on Sunday and 3 tabs (1200) units daily except Sunday. 12/23/20  Yes [provider]  escitalopram (LEXAPRO) 5 MG tablet Take 5 mg by mouth daily.   Yes [provider]  fluticasone (FLONASE) 50 MCG/ACT nasal spray Place 1 spray into both nostrils as needed for allergies or rhinitis.    Yes [provider]  folic acid (FOLVITE) 1 MG tablet Take 1 mg by mouth daily. 11/02/21  Yes [provider]  furosemide (LASIX) 20 MG tablet Take 1 tablet (20 mg total) by mouth daily. 07/16/21 01/02/22 Yes Lamptey, Myrene Galas, MD   gabapentin (NEURONTIN) 300 MG capsule Take 300 mg by mouth 2 (two) times daily. Taking twice daily 10/26/17  Yes [provider]  losartan (COZAAR) 25 MG tablet Take 1 tablet (25 mg total) by mouth daily. 07/16/21  Yes Lamptey, Myrene Galas, MD  midodrine (PROAMATINE) 5 MG tablet Take 1 tablet (5 mg total) by mouth 3 (three) times daily with meals. 07/05/21  Yes Isla Pence, MD  mirtazapine (REMERON) 15 MG tablet Take 1 tablet (15 mg total) by mouth at bedtime. 04/23/19  Yes Robyn Haber, MD  montelukast (SINGULAIR) 10 MG tablet Take 50 mg by mouth daily. 04/28/21  Yes [provider]  potassium chloride SA (KLOR-CON M) 20 MEQ tablet Take 20 mEq by mouth daily. 05/04/21  Yes [provider]  predniSONE (DELTASONE) 20 MG tablet Take 100 mg by mouth daily. 12/25/21  Yes [provider]  simvastatin (ZOCOR) 40 MG tablet Take 40 mg by mouth at bedtime. 03/10/20  Yes [provider]  triamcinolone cream (KENALOG) 0.5 % Apply 1 application topically 3 (three) times daily. 10/06/17  Yes [provider]  azithromycin (ZITHROMAX) 250 MG tablet Take first 2 tablets together, then 1 every day until finished.  Patient not taking: Reported on 01/02/2022 07/16/21   Chase Picket, MD  glimepiride (AMARYL) 2 MG tablet Take 2 mg by mouth daily. Patient not taking: Reported on 01/02/2022 09/05/21   [provider]    Inpatient Medications: Scheduled Meds:  doxycycline  100 mg Oral Q12H   enoxaparin (LOVENOX) injection  40 mg Subcutaneous QHS   escitalopram  5 mg Oral Daily   furosemide  40 mg Intravenous Daily   insulin aspart  0-9 Units Subcutaneous TID WC   mirtazapine  15 mg Oral QHS   predniSONE  40 mg Oral Q breakfast   Continuous Infusions:  potassium chloride 10 mEq (01/03/22 0642)   PRN Meds:   Allergies:    Allergies  Allergen Reactions   Prozac [Fluoxetine Hcl] Other (See Comments)    Gittery   Vit D-Vit E-Safflower Oil Other (See  Comments)    Not specified    Social History:   Social History   Socioeconomic History   Marital status: Divorced    Spouse name: Not on file   Number of children: Not on file   Years of education: Not on file   Highest education level: Not on file  Occupational History   Not on file  Tobacco Use   Smoking status: Former    Types: Cigarettes    Quit date: 1995    Years since quitting: 28.7   Smokeless tobacco: Never  Vaping Use   Vaping Use: Never used  Substance and Sexual Activity   Alcohol use: Not Currently   Drug use: Not Currently   Sexual activity: Not Currently  Other Topics Concern   Not on file  Social History Narrative   Not on file   Social Determinants of Health   Financial Resource Strain: Not on file  Food Insecurity: Not on file  Transportation Needs: Not on file  Physical Activity: Not on file  Stress: Not on file  Social Connections: Not on file  Intimate Partner Violence: Not on file    Family History:    Family History  Problem Relation Age of Onset   Hypertension Mother    Diabetes Mother    Cancer Father      ROS:  Constitutional:Fatigue  Eyes: Denied vision change or loss Ears/Nose/Mouth/Throat: Denied ear ache, sore throat, sinus pain Cardiovascular: see HPI  Respiratory: see HPI  Gastrointestinal: Denied nausea, vomiting, abdominal pain, diarrhea Genital/Urinary: Denied dysuria, hematuria, urinary frequency/urgency Musculoskeletal: Denied muscle ache, joint pain, weakness Skin: Denied rash, wound Neuro: Denied headache, dizziness, syncope Psych: Denied history of depression/anxiety  Endocrine: history of diabetes   Physical Exam/Data:   Vitals:   01/02/22 2033 01/03/22 0041 01/03/22 0500 01/03/22 0549  BP: 121/68 111/65  132/70  Pulse: 85 78  70  Resp: (!) 21 19  20   Temp: 98.1 F (36.7 C) 97.8 F (36.6 C)  97.8 F (36.6 C)  TempSrc: Oral Axillary  Axillary  SpO2: 95% 97%  97%  Weight:   94.7 kg   Height:         Intake/Output Summary (Last 24 hours) at 01/03/2022 0813 Last data filed at 01/03/2022 0552 Gross per 24 hour  Intake --  Output 1750 ml  Net -1750 ml      01/03/2022    5:00 AM 01/02/2022    5:00 AM 01/01/2022   11:16 PM  Last 3 Weights  Weight (lbs) 208 lb 12.4 oz 208 lb 5.4 oz 209 lb 10.5 oz  Weight (kg) 94.7 kg  94.5 kg 95.1 kg     Body mass index is 34.74 kg/m.   Vitals:  Vitals:   01/03/22 0549 01/03/22 0815  BP: 132/70 (!) 117/59  Pulse: 70 77  Resp: 20 20  Temp: 97.8 F (36.6 C) 98.1 F (36.7 C)  SpO2: 97% 97%   General Appearance: In no apparent distress, laying in bed HEENT: Normocephalic, atraumatic.  Neck: Supple, trachea midline, no JVDs Cardiovascular: Regular rate and rhythm, normal S1-S2,  no murmur Respiratory: Resting breathing unlabored, lungs sounds diminished, scattered wheezing noted, on 2LNC, speaks full sentence  Gastrointestinal: Bowel sounds positive, abdomen soft Extremities: Able to move all extremities in bed without difficulty, BLE 1-2 + edema  Musculoskeletal: Normal muscle bulk and tone Skin: Intact, warm, dry. No rashes or petechiae noted in exposed areas.  Neurologic: Alert, oriented to person, place and time. Fluent speech, no facial droop, no cognitive deficit Psychiatric: Normal affect. Mood is appropriate.    EKG:  The EKG was personally reviewed and demonstrates:  EKG from 01/01/22 with sinus tachycardia 101BPM, old LBBB  Telemetry:  Telemetry was personally reviewed and demonstrates:  Sinus rhythm    Relevant CV Studies:  Echo 01/02/22:    1. Left ventricular ejection fraction, by estimation, is 35 to 40%. The  left ventricle has moderately decreased function. The left ventricle  demonstrates global hypokinesis. The left ventricular internal cavity size  was moderately dilated. Left  ventricular diastolic parameters are consistent with Grade I diastolic  dysfunction (impaired relaxation).   2. Right ventricular systolic  function is normal. The right ventricular  size is normal.   3. The mitral valve is abnormal. Trivial mitral valve regurgitation. No  evidence of mitral stenosis.   4. Nodular calcifiecation of non coronary cusp. The aortic valve is  tricuspid. There is moderate calcification of the aortic valve. Aortic  valve regurgitation is not visualized. Aortic valve sclerosis is present,  with no evidence of aortic valve  stenosis.   5. The inferior vena cava is normal in size with greater than 50%  respiratory variability, suggesting right atrial pressure of 3 mmHg.   Laboratory Data:  High Sensitivity Troponin:   Recent Labs  Lab 01/01/22 1643 01/01/22 2016  TROPONINIHS 30* 29*     Chemistry Recent Labs  Lab 01/01/22 1643 01/01/22 1722 01/02/22 0346 01/03/22 0448  NA 140 141 143 143  K 4.0 3.8 3.5 2.7*  CL 98  --  99 96*  CO2 29  --  34* 37*  GLUCOSE 170*  --  176* 101*  BUN 15  --  14 15  CREATININE 1.23*  --  1.04* 0.95  CALCIUM 8.9  --  8.6* 8.6*  MG 2.1  --   --  1.9  GFRNONAA 46*  --  56* >60  ANIONGAP 13  --  10 10    Recent Labs  Lab 01/01/22 1643  PROT 6.5  ALBUMIN 3.3*  AST 15  ALT 9  ALKPHOS 47  BILITOT 1.0   Lipids No results for input(s): "CHOL", "TRIG", "HDL", "LABVLDL", "LDLCALC", "CHOLHDL" in the last 168 hours.  Hematology Recent Labs  Lab 01/01/22 1643 01/01/22 1722 01/03/22 0448  WBC 7.9  --  9.8  RBC 4.00  --  3.79*  HGB 12.7 13.6 11.9*  HCT 40.8 40.0 38.4  MCV 102.0*  --  101.3*  MCH 31.8  --  31.4  MCHC 31.1  --  31.0  RDW 13.8  --  13.3  PLT 168  --  157   Thyroid No results for input(s): "TSH", "FREET4" in the last 168 hours.  BNP Recent Labs  Lab 01/01/22 1643  BNP 2,425.8*    DDimer No results for input(s): "DDIMER" in the last 168 hours.   Radiology/Studies:  ECHOCARDIOGRAM COMPLETE  Result Date: 01/02/2022    ECHOCARDIOGRAM REPORT   Patient Name:   Charlene Houston Date of Exam: 01/02/2022 Medical Rec #:   [id number]               Height:       65.0 in Accession #:    [id number]              Weight:       208.3 lb Date of Birth:  12/17/1945               BSA:          2.013 m Patient Age:    38 years                BP:           103/52 mmHg Patient Gender: F                       HR:           89 bpm. Exam Location:  Inpatient Procedure: 2D Echo Indications:    acute systolic chf.  History:        Patient has no prior history of Echocardiogram examinations.                 Risk Factors:Diabetes, Hypertension, Dyslipidemia and Sleep                 Apnea.  Sonographer:    Johny Chess RDCS Referring Phys: [id number] [place] T TU  Sonographer Comments: Patient is obese. Image acquisition challenging due to patient body habitus. IMPRESSIONS  1. Left ventricular ejection fraction, by estimation, is 35 to 40%. The left ventricle has moderately decreased function. The left ventricle demonstrates global hypokinesis. The left ventricular internal cavity size was moderately dilated. Left ventricular diastolic parameters are consistent with Grade I diastolic dysfunction (impaired relaxation).  2. Right ventricular systolic function is normal. The right ventricular size is normal.  3. The mitral valve is abnormal. Trivial mitral valve regurgitation. No evidence of mitral stenosis.  4. Nodular calcifiecation of non coronary cusp. The aortic valve is tricuspid. There is moderate calcification of the aortic valve. Aortic valve regurgitation is not visualized. Aortic valve sclerosis is present, with no evidence of aortic valve stenosis.  5. The inferior vena cava is normal in size with greater than 50% respiratory variability, suggesting right atrial pressure of 3 mmHg. FINDINGS  Left Ventricle: Left ventricular ejection fraction, by estimation, is 35 to 40%. The left ventricle has moderately decreased function. The left ventricle demonstrates global hypokinesis. Definity contrast agent was given IV to delineate the left ventricular  endocardial borders. The left ventricular internal cavity size was moderately dilated. There is no left ventricular hypertrophy. Left ventricular diastolic parameters are consistent with Grade I diastolic dysfunction (impaired relaxation). Right Ventricle: The right ventricular size is normal. No increase in right ventricular wall thickness. Right ventricular systolic function is normal. Left Atrium: Left atrial size was normal in size. Right Atrium: Right atrial size was normal in size. Pericardium: There is no evidence of pericardial effusion. Mitral Valve: The mitral valve is abnormal. There is mild thickening of the mitral valve leaflet(s). Trivial  mitral valve regurgitation. No evidence of mitral valve stenosis. Tricuspid Valve: The tricuspid valve is normal in structure. Tricuspid valve regurgitation is mild . No evidence of tricuspid stenosis. Aortic Valve: Nodular calcifiecation of non coronary cusp. The aortic valve is tricuspid. There is moderate calcification of the aortic valve. Aortic valve regurgitation is not visualized. Aortic valve sclerosis is present, with no evidence of aortic valve stenosis. Pulmonic Valve: The pulmonic valve was normal in structure. Pulmonic valve regurgitation is not visualized. No evidence of pulmonic stenosis. Aorta: The aortic root is normal in size and structure. Venous: The inferior vena cava is normal in size with greater than 50% respiratory variability, suggesting right atrial pressure of 3 mmHg. IAS/Shunts: No atrial level shunt detected by color flow Doppler.  LEFT VENTRICLE PLAX 2D LVIDd:         5.50 cm   Diastology LVIDs:         4.90 cm   LV e' medial:    5.55 cm/s LV PW:         1.10 cm   LV E/e' medial:  13.2 LV IVS:        1.10 cm   LV e' lateral:   7.83 cm/s LVOT diam:     2.00 cm   LV E/e' lateral: 9.4 LV SV:         59 LV SV Index:   29 LVOT Area:     3.14 cm  RIGHT VENTRICLE             IVC RV S prime:     12.50 cm/s  IVC diam: 1.70 cm TAPSE (M-mode): 3.2  cm LEFT ATRIUM             Index        RIGHT ATRIUM           Index LA diam:        3.40 cm 1.69 cm/m   RA Area:     18.00 cm LA Vol (A2C):   62.1 ml 30.85 ml/m  RA Volume:   54.40 ml  27.03 ml/m LA Vol (A4C):   52.5 ml 26.08 ml/m LA Biplane Vol: 58.0 ml 28.82 ml/m  AORTIC VALVE LVOT Vmax:   101.00 cm/s LVOT Vmean:  67.600 cm/s LVOT VTI:    0.187 m  AORTA Ao Root diam: 2.80 cm Ao Asc diam:  3.20 cm MV E velocity: 73.30 cm/s MV A velocity: 117.00 cm/s  SHUNTS MV E/A ratio:  0.63         Systemic VTI:  0.19 m                             Systemic Diam: 2.00 cm Jenkins Rouge MD Electronically signed by Jenkins Rouge MD Signature Date/Time: 01/02/2022/11:51:18 AM    Final    CT Angio Chest PE W and/or Wo Contrast  Result Date: 01/01/2022 CLINICAL DATA:  Respiratory distress starting 1 hour ago diminished lung sounds; PE suspected high probability EXAM: CT ANGIOGRAPHY CHEST WITH CONTRAST TECHNIQUE: Multidetector CT imaging of the chest was performed using the standard protocol during bolus administration of intravenous contrast. Multiplanar CT image reconstructions and MIPs were obtained to evaluate the vascular anatomy. RADIATION DOSE REDUCTION: This exam was performed according to the departmental dose-optimization program which includes automated exposure control, adjustment of the mA and/or kV according to patient size and/or use of iterative reconstruction technique. CONTRAST:  31mL OMNIPAQUE IOHEXOL 350 MG/ML SOLN  COMPARISON:  Radiographs earlier today and CT chest 06/10/2021 FINDINGS: Cardiovascular: Satisfactory opacification of the central pulmonary arteries. Evaluation of the segmental and subsegmental branches is limited due to respiratory motion artifact. No pulmonary embolism identified. Cardiomegaly. Coronary artery calcification. Aortic calcification. No pericardial effusion. Right chest wall Port-A-Cath. Mediastinum/Nodes: No enlarged mediastinal, hilar, or axillary lymph nodes. Thyroid gland,  trachea, and esophagus demonstrate no significant findings. Lungs/Pleura: New small bilateral pleural effusions and compressive atelectasis. Numerous pulmonary nodules are redemonstrated again measuring up to 1.3 cm in the medial right lower lobe (series 7/image 69). Similar patchy ground-glass opacities bilaterally which are nonspecific and may be due to edema, infection, or metastatic disease. Upper Abdomen: No acute abnormality. Musculoskeletal: No chest wall abnormality. No acute osseous findings. Surgical clips right breast. Review of the MIP images confirms the above findings. IMPRESSION: 1. No CT evidence of pulmonary embolus. 2. New small bilateral pleural effusions and compressive atelectasis. 3. Multiple bilateral pulmonary nodules concerning for metastatic disease grossly similar to 06/10/2021. 4. Cardiomegaly and coronary artery vascular calcification. Electronically Signed   By: Placido Sou M.D.   On: 01/01/2022 20:10   DG Chest Port 1 View  Result Date: 01/01/2022 CLINICAL DATA:  Shortness of breath EXAM: PORTABLE CHEST 1 VIEW COMPARISON:  Chest x-ray 07/16/2021 FINDINGS: Right chest port catheter tip projects over the distal SVC. The heart is enlarged, unchanged. There are minimal bibasilar opacities favored is atelectasis. No pleural effusion or pneumothorax. No acute fractures. IMPRESSION: 1. Minimal bibasilar atelectasis. 2. Stable cardiomegaly. Electronically Signed   By: Ronney Asters M.D.   On: 01/01/2022 17:44     Assessment and Plan:   Acute systolic and diastolic heart failure, new diagnosis - Presented with worsening of DOE, BLE edema for 2 month  - BNP 2425, Hs trop 30>29  demand  - Echocardiogram with LVEF 35-40%, grade I DD, no significant valvular disease (no prior echo to compare) - query immunotherapy associated etiology  - Net -2.6 L, weight records indicates 200 >209>208 ibs since admission - Has been treated with IV Lasix 40 mg daily by internal medicine over the  past 48 hours, clinically remains hypervolemic today, recommend increase IV lasix to 40mg  BID  - Please monitor strict intake and output, daily weight, daily electrolytes, keep K >4 and Mag >2 - GDMT: Blood pressure low normal and renal index WNL, would resume home med losartan 25mg  daily, start [place] 10mg  daily, monitor for tolerance  Acute hypoxic respiratory failure -Likely multifactorial from decompensated heart failure, untreated OSA, as well as underlying metastatic lung cancer with immunotherapy induced pneumonitis and obesity  -Wean off oxygen as tolerated  Hypertension History of hypotension -Blood pressure is low normal currently, on  Lasix, losartan, and also midodrine for hypotension at home, trend BP, see above changes   Metastatic non-small cell lung cancer Immunotherapy induced pneumonitis Obstructive sleep apnea Type 2 diabetes with hyperglycemia Hyperlipidemia -Per primary team     Risk Assessment/Risk Scores:   New York Heart Association (NYHA) Functional Class NYHA Class III     For questions or updates, please contact [place] Please consult [email or web address] for contact info under    Signed, Margie Billet, NP  01/03/2022 8:13 AM  Patient examined chart reviewed Exam with cushingoid black female no murmur basilar atelectasis JVP hard to evaluate Chemo port under right clavicle abdomen benign Plus one bilateral LE edema. She has had FTT Over last month with pneumonitis from [place]. No clear alternative planned for active lung cancer with  worsening CT. She gets her care at [facility] and denies have had TTE before. TTE here with global hypokinesis EF 35-40% and elevated BNP. Not clear if cardiomyopathy due to prior chemo from breast cancer Continue ARB as BP likely too low for Entresto. Add Farxiga as BS high on steroids. Continue diuresis Plan for metastatic lung cancer per oncology and pulmonary   Jenkins Rouge MD [facility]

## 2022-01-04 ENCOUNTER — Other Ambulatory Visit (HOSPITAL_COMMUNITY): Payer: Self-pay

## 2022-01-04 ENCOUNTER — Encounter (HOSPITAL_COMMUNITY): Payer: Self-pay | Admitting: Family Medicine

## 2022-01-04 DIAGNOSIS — J984 Other disorders of lung: Secondary | ICD-10-CM

## 2022-01-04 DIAGNOSIS — J9601 Acute respiratory failure with hypoxia: Secondary | ICD-10-CM | POA: Diagnosis not present

## 2022-01-04 DIAGNOSIS — J189 Pneumonia, unspecified organism: Secondary | ICD-10-CM | POA: Diagnosis not present

## 2022-01-04 DIAGNOSIS — I5041 Acute combined systolic (congestive) and diastolic (congestive) heart failure: Secondary | ICD-10-CM

## 2022-01-04 DIAGNOSIS — N179 Acute kidney failure, unspecified: Secondary | ICD-10-CM | POA: Diagnosis not present

## 2022-01-04 DIAGNOSIS — T50905A Adverse effect of unspecified drugs, medicaments and biological substances, initial encounter: Secondary | ICD-10-CM

## 2022-01-04 LAB — BASIC METABOLIC PANEL
Anion gap: 11 (ref 5–15)
BUN: 15 mg/dL (ref 8–23)
CO2: 38 mmol/L — ABNORMAL HIGH (ref 22–32)
Calcium: 8.6 mg/dL — ABNORMAL LOW (ref 8.9–10.3)
Chloride: 93 mmol/L — ABNORMAL LOW (ref 98–111)
Creatinine, Ser: 0.86 mg/dL (ref 0.44–1.00)
GFR, Estimated: >60 mL/min (ref >60)
Glucose, Bld: 127 mg/dL — ABNORMAL HIGH (ref 70–99)
Potassium: 3.3 mmol/L — ABNORMAL LOW (ref 3.5–5.1)
Sodium: 142 mmol/L (ref 135–145)

## 2022-01-04 LAB — MAGNESIUM: Magnesium: 1.8 mg/dL (ref 1.7–2.4)

## 2022-01-04 LAB — GLUCOSE, CAPILLARY
Glucose-Capillary: 108 mg/dL — ABNORMAL HIGH (ref 70–99)
Glucose-Capillary: 186 mg/dL — ABNORMAL HIGH (ref 70–99)
Glucose-Capillary: 321 mg/dL — ABNORMAL HIGH (ref 70–99)
Glucose-Capillary: 159 mg/dL — ABNORMAL HIGH (ref 70–99)

## 2022-01-04 MED ORDER — POTASSIUM CHLORIDE CRYS ER 20 MEQ PO TBCR
40.0000 meq | EXTENDED_RELEASE_TABLET | ORAL | Status: AC
  Administered 2022-01-04 (×2): 40 meq via ORAL
  Filled 2022-01-04 (×2): qty 2

## 2022-01-04 MED ORDER — EMPAGLIFLOZIN 10 MG PO TABS
10.0000 mg | ORAL_TABLET | Freq: Every day | ORAL | Status: DC
  Administered 2022-01-05 – 2022-01-06 (×2): 10 mg via ORAL
  Filled 2022-01-04 (×2): qty 1

## 2022-01-04 NOTE — Progress Notes (Signed)
Subjective:  No chest pain dyspnea better on bipap   Objective:  Vitals:   01/03/22 0815 01/03/22 2016 01/03/22 2348 01/04/22 0433  BP: (!) 117/59 133/78 122/65 (!) 101/57  Pulse: 77 87 76 71  Resp: 20 18 14 12   Temp: 98.1 F (36.7 C) 98.2 F (36.8 C) 98 F (36.7 C) 98.3 F (36.8 C)  TempSrc: Oral Oral Axillary Axillary  SpO2: 97% 94% 95% 96%  Weight:      Height:        Intake/Output from previous day:  Intake/Output Summary (Last 24 hours) at 01/04/2022 0809 Last data filed at 01/03/2022 2000 Gross per 24 hour  Intake --  Output 1600 ml  Net -1600 ml    Physical Exam: Obese black female Port under right clavicle CPAP basilar crackles  No murmur distant heart sounds Plus one edema   Lab Results: Basic Metabolic Panel: Recent Labs    01/03/22 0448 01/03/22 1511 01/04/22 0239  NA 143 140 142  K 2.7* 4.3 3.3*  CL 96* 96* 93*  CO2 37* 34* 38*  GLUCOSE 101* 223* 127*  BUN 15 18 15   CREATININE 0.95 0.88 0.86  CALCIUM 8.6* 8.7* 8.6*  MG 1.9  --  1.8   Liver Function Tests: Recent Labs    01/01/22 1643  AST 15  ALT 9  ALKPHOS 47  BILITOT 1.0  PROT 6.5  ALBUMIN 3.3*   No results for input(s): "LIPASE", "AMYLASE" in the last 72 hours. CBC: Recent Labs    01/01/22 1643 01/01/22 1722 01/03/22 0448  WBC 7.9  --  9.8  NEUTROABS 6.8  --  8.2*  HGB 12.7 13.6 11.9*  HCT 40.8 40.0 38.4  MCV 102.0*  --  101.3*  PLT 168  --  157    Hemoglobin A1C: Recent Labs    01/02/22 0346  HGBA1C 6.0*     Imaging: ECHOCARDIOGRAM COMPLETE  Result Date: 01/02/2022    ECHOCARDIOGRAM REPORT   Patient Name:   Charlene Houston Date of Exam: 01/02/2022 Medical Rec #:  [id number]               Height:       65.0 in Accession #:    [id number]              Weight:       208.3 lb Date of Birth:  12/26/1945               BSA:          2.013 m Patient Age:    76 years                BP:           103/52 mmHg Patient Gender: F                       HR:            89 bpm. Exam Location:  Inpatient Procedure: 2D Echo Indications:    acute systolic chf.  History:        Patient has no prior history of Echocardiogram examinations.                 Risk Factors:Diabetes, Hypertension, Dyslipidemia and Sleep                 Apnea.  Sonographer:    Johny Chess RDCS Referring Phys: [id number] [place] T TU  Sonographer Comments: Patient  is obese. Image acquisition challenging due to patient body habitus. IMPRESSIONS  1. Left ventricular ejection fraction, by estimation, is 35 to 40%. The left ventricle has moderately decreased function. The left ventricle demonstrates global hypokinesis. The left ventricular internal cavity size was moderately dilated. Left ventricular diastolic parameters are consistent with Grade I diastolic dysfunction (impaired relaxation).  2. Right ventricular systolic function is normal. The right ventricular size is normal.  3. The mitral valve is abnormal. Trivial mitral valve regurgitation. No evidence of mitral stenosis.  4. Nodular calcifiecation of non coronary cusp. The aortic valve is tricuspid. There is moderate calcification of the aortic valve. Aortic valve regurgitation is not visualized. Aortic valve sclerosis is present, with no evidence of aortic valve stenosis.  5. The inferior vena cava is normal in size with greater than 50% respiratory variability, suggesting right atrial pressure of 3 mmHg. FINDINGS  Left Ventricle: Left ventricular ejection fraction, by estimation, is 35 to 40%. The left ventricle has moderately decreased function. The left ventricle demonstrates global hypokinesis. Definity contrast agent was given IV to delineate the left ventricular endocardial borders. The left ventricular internal cavity size was moderately dilated. There is no left ventricular hypertrophy. Left ventricular diastolic parameters are consistent with Grade I diastolic dysfunction (impaired relaxation). Right Ventricle: The right ventricular size is  normal. No increase in right ventricular wall thickness. Right ventricular systolic function is normal. Left Atrium: Left atrial size was normal in size. Right Atrium: Right atrial size was normal in size. Pericardium: There is no evidence of pericardial effusion. Mitral Valve: The mitral valve is abnormal. There is mild thickening of the mitral valve leaflet(s). Trivial mitral valve regurgitation. No evidence of mitral valve stenosis. Tricuspid Valve: The tricuspid valve is normal in structure. Tricuspid valve regurgitation is mild . No evidence of tricuspid stenosis. Aortic Valve: Nodular calcifiecation of non coronary cusp. The aortic valve is tricuspid. There is moderate calcification of the aortic valve. Aortic valve regurgitation is not visualized. Aortic valve sclerosis is present, with no evidence of aortic valve stenosis. Pulmonic Valve: The pulmonic valve was normal in structure. Pulmonic valve regurgitation is not visualized. No evidence of pulmonic stenosis. Aorta: The aortic root is normal in size and structure. Venous: The inferior vena cava is normal in size with greater than 50% respiratory variability, suggesting right atrial pressure of 3 mmHg. IAS/Shunts: No atrial level shunt detected by color flow Doppler.  LEFT VENTRICLE PLAX 2D LVIDd:         5.50 cm   Diastology LVIDs:         4.90 cm   LV e' medial:    5.55 cm/s LV PW:         1.10 cm   LV E/e' medial:  13.2 LV IVS:        1.10 cm   LV e' lateral:   7.83 cm/s LVOT diam:     2.00 cm   LV E/e' lateral: 9.4 LV SV:         59 LV SV Index:   29 LVOT Area:     3.14 cm  RIGHT VENTRICLE             IVC RV S prime:     12.50 cm/s  IVC diam: 1.70 cm TAPSE (M-mode): 3.2 cm LEFT ATRIUM             Index        RIGHT ATRIUM           Index  LA diam:        3.40 cm 1.69 cm/m   RA Area:     18.00 cm LA Vol (A2C):   62.1 ml 30.85 ml/m  RA Volume:   54.40 ml  27.03 ml/m LA Vol (A4C):   52.5 ml 26.08 ml/m LA Biplane Vol: 58.0 ml 28.82 ml/m  AORTIC VALVE  LVOT Vmax:   101.00 cm/s LVOT Vmean:  67.600 cm/s LVOT VTI:    0.187 m  AORTA Ao Root diam: 2.80 cm Ao Asc diam:  3.20 cm MV E velocity: 73.30 cm/s MV A velocity: 117.00 cm/s  SHUNTS MV E/A ratio:  0.63         Systemic VTI:  0.19 m                             Systemic Diam: 2.00 cm Jenkins Rouge MD Electronically signed by Jenkins Rouge MD Signature Date/Time: 01/02/2022/11:51:18 AM    Final     Cardiac Studies:  ECG: ST rate 101 LBBB   Telemetry:NSR 01/04/2022   Echo: EF 35-40%   Medications:    dapagliflozin propanediol  10 mg Oral Daily   doxycycline  100 mg Oral Q12H   enoxaparin (LOVENOX) injection  40 mg Subcutaneous QHS   escitalopram  5 mg Oral Daily   furosemide  40 mg Intravenous BID   insulin aspart  0-9 Units Subcutaneous TID WC   losartan  25 mg Oral Daily   mirtazapine  15 mg Oral QHS   potassium chloride  40 mEq Oral Q4H   predniSONE  40 mg Oral Q breakfast      Assessment/Plan:  Charlene Houston is a 76 y.o. 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 Starla Link.  CHF:  acute systolic CHF not clear if related to previous chemo from breast cancer Added Farxiga, losartan and lasix Good diuresis will need f/u echo in 3 months BP too soft for entresto Supplement K Cancer:  breast and now non small cell lung with METS and ? Pneumonitis from [facility] on steroids now CTA negative for PE back on CPAP Needs alternative Rx to [facility] CT with multiple pulmonary nodules similar to 06/10/21   Jenkins Rouge 01/04/2022, 8:09 AM

## 2022-01-04 NOTE — Progress Notes (Signed)
Heart Failure Nurse Navigator Progress Note  PCP: Lilian Coma., MD PCP-Cardiologist: None Admission Diagnosis: Acute respiratory failure with hypoxia.  Admitted from: Home via EMS  Presentation:   Charlene Houston presented with shortness of breath and chest tightness. Sats in the 80's, EMS placed on CPAP, and received breathing treatments. She reports productive cough in the setting of her lung cancer, and a recent increase to her lasix due to her lower extremity edema. She recently completed a steroid taper and is currently on immunotherapy. BNP 2,425, IV lasix 40mg  given, CXR showed stable cardiomegaly with small amounts of bilateral atelectasis.   Patient was educated on the sign and symptoms of heart failure, daily weights, when to call her doctor or go to the ER, Diet/ fluid restrictions ( Patient spoke of eating out a lot , especially chinese food, and likes to drink Dr. Malachi Bonds. ) Patient stated she takes all her medications as prescribed and attends her doctor appointments. Patient verbalized her understanding of the education and is scheduled for a HF TOC appointment on 10/4/0=23 @ 11 am.  ECHO/ LVEF: 35-40% G1DD  Clinical Course:  Past Medical History:  Diagnosis Date   Arthritis    Asthma    Breast cancer ([place])    Diabetes mellitus without complication ([place])    Hypertension    SBO (small bowel obstruction) ([place]) 06/2018     Social History   Socioeconomic History   Marital status: Divorced    Spouse name: Not on file   Number of children: 2   Years of education: Not on file   Highest education level: High school graduate  Occupational History   Occupation: Retired  Tobacco Use   Smoking status: Former    Types: Cigarettes    Quit date: 1995    Years since quitting: 28.7   Smokeless tobacco: Never  Vaping Use   Vaping Use: Never used  Substance and Sexual Activity   Alcohol use: Not Currently   Drug use: Not Currently   Sexual activity: Not Currently   Other Topics Concern   Not on file  Social History Narrative   Not on file   Social Determinants of Health   Financial Resource Strain: Low Risk  (01/04/2022)   Overall Financial Resource Strain (CARDIA)    Difficulty of Paying Living Expenses: Not very hard  Food Insecurity: No Food Insecurity (01/04/2022)   Hunger Vital Sign    Worried About Running Out of Food in the Last Year: Never true    Ran Out of Food in the Last Year: Never true  Transportation Needs: No Transportation Needs (01/04/2022)   PRAPARE - Hydrologist (Medical): No    Lack of Transportation (Non-Medical): No  Physical Activity: Not on file  Stress: Not on file  Social Connections: Not on file   Education Assessment and Provision:  Detailed education and instructions provided on heart failure disease management including the following:  Signs and symptoms of Heart Failure When to call the physician Importance of daily weights Low sodium diet Fluid restriction Medication management Anticipated future follow-up appointments  Patient education given on each of the above topics.  Patient acknowledges understanding via teach back method and acceptance of all instructions.  Education Materials:  "Living Better With Heart Failure" Booklet, HF zone tool, & Daily Weight Tracker Tool.  Patient has scale at home: yes Patient has pill box at home: NA    High Risk Criteria for Readmission and/or Poor Patient  Outcomes: Heart failure hospital admissions (last 6 months): 0  No Show rate: 0 Difficult social situation: No Demonstrates medication adherence: yes Primary Language: English Literacy level: Reading, writing, and comprehension.   Barriers of Care:   Diet/ fluid restrictions ( Chinese food/ Dr. Malachi Bonds) Daily weights  Considerations/Referrals:   Referral made to Heart Failure Pharmacist Stewardship: Yes Referral made to Heart Failure CSW/NCM TOC: No Referral made to Heart &  Vascular TOC clinic: Yes, 01/13/2022 @ 11 am.   Items for Follow-up on DC/TOC: Diet/ fluid restrictions ( Chinese foods, fast food, and Dr. Malachi Bonds) Daily weights Needs Cardiologist   Earnestine Leys, BSN, RN Heart Failure Nurse Navigator Secure Chat Only

## 2022-01-04 NOTE — Progress Notes (Signed)
Heart Failure Stewardship Pharmacist Progress Note   PCP: Lilian Coma., MD PCP-Cardiologist: None    HPI:  76 yo F with PMH of breast cancer, metastatic NSCLC, T2DM, HTN, HLD, and OSA.  Recently, her lasix was increased as outpatient because of LE edema. She was also taken off Keytruda due to concerns of drug-induced pneumonitis.   She presented to the ED on 9/22 with shortness of breath. BNP elevated at 2425.8. CXR with stable cardiomegaly. CTA negative for PE, new bilateral pleural effusions and concern for metastatic pulmonary nodules. ECHO 9/23 showed LVEF 35-40%, G1DD, RV normal, and trivial MR.   Current HF Medications: Diuretic: furosemide 40 mg IV BID ACE/ARB/ARNI: losartan 25 mg daily SGLT2i: Farxiga 10 mg daily  Prior to admission HF Medications: Diuretic: furosemide 20 mg daily ACE/ARB/ARNI: losartan 25 mg daily *also on midodrine 5 mg TID  Pertinent Lab Values: Serum creatinine 0.86, BUN 15, Potassium 3.3, Sodium 142, BNP 2425.8, Magnesium 1.8, A1c 6.0   Vital Signs: Weight: 208 lbs (admission weight: 208 lbs) Blood pressure: 90/50-120/70s  Heart rate: 70s  I/O: -2.2L yesterday; net incomplete  Medication Assistance / Insurance Benefits Check: Does the patient have prescription insurance?  Yes Type of insurance plan: [employer] Medicare  Does the patient qualify for medication assistance through manufacturers or grants?   Pending Eligible grants and/or patient assistance programs: pending Medication assistance applications in progress: none  Medication assistance applications approved: none Approved medication assistance renewals will be completed by: pending  Outpatient Pharmacy:  Prior to admission outpatient pharmacy: [facility] Is the patient willing to use [place] pharmacy at discharge? Yes Is the patient willing to transition their outpatient pharmacy to utilize a [facility] outpatient pharmacy?   Pending    Assessment: 1. Acute systolic CHF (LVEF  11-03%), due to presumed NICM. NYHA class III symptoms. - Continue furosemide 40 mg IV BID. Strict I/Os and daily weights. KCl 40 mEq x 2 ordered for replacement. Keep K>4 and Mag>2. - Consider adding metoprolol XL once euvolemic - Continue losartan 25 mg daily. Unclear if she would tolerate escalation to [facility]. - Consider adding spironolactone 12.5 mg daily prior to discharge - On Farxiga 10 mg daily. Vania Rea is cheaper on her insurance. Consider switching to Jardiance 10 mg daily.  - On midodrine at home for hypotension. Currently on hold now. Monitor closely.    Plan: 1) Medication changes recommended at this time: - Switch Farxiga to Jardiance 10 mg daily   2) Patient assistance: - Farxiga copay $95 - Jardiance copay $45  3)  Education  - To be completed prior to discharge  Kerby Nora, PharmD, BCPS Heart Failure Stewardship Pharmacist Phone [phone]

## 2022-01-04 NOTE — Progress Notes (Signed)
PROGRESS NOTE    Charlene Houston  GTX:[id number] DOB: September 11, 1945 DOA: 01/01/2022 PCP: Lilian Coma., MD   Brief Narrative:  76 y.o. female with medical history significant of breast cancer s/p right lumepctomy status post chemo/XRT, NSCLC of left lung with Beryle Flock currently on hold because of concerns for drug-induced pneumonitis currently being treated as an outpatient with course of steroid taper followed by higher dose of prednisone from 12/24/2021 onwards, type 2 diabetes, hypertension, hyperlipidemia, OSA on CPAP presented with worsening shortness of breath.  She is in the process of getting supplemental oxygen at home.  Her Lasix dose was also recently increased because of worsening leg swelling.  On presentation, she was tachycardic and tachypneic and required BiPAP on presentation.  BNP was 2425.8. CTA chest was negative for PE although segmental and subsegmental branches limited due to respiratory motion artifact.  New bilateral small pleural effusion and compressive atelectasis.  Multiple bilateral pulmonary nodules concerning for metastatic disease similar to prior.  She was given IV Lasix.  Pulmonary was consulted.  Echo showed EF of 35 to 40% with grade 1 diastolic dysfunction.  Cardiology was consulted.  Assessment & Plan:   Acute respiratory failure with hypoxia Possible acute systolic and diastolic heart failure Drug-induced pneumonitis -Presented with hypoxia requiring BiPAP on presentation.  Possibly from combination of worsening of her pneumonitis caused by [facility] along with new onset CHF.  -Pulmonary signed off on 01/03/2022: Recommended prednisone for 7 to 10 days and 7 days of mg oral doxycycline.  Outpatient follow-up with pulmonary. -Cardiology following.  Currently on Lasix 40 mg IV every 12 hours.  Echo showed EF of 35 to 40% with grade 1 diastolic dysfunction.  Strict input output.  Daily weights.  Fluid restriction.  Patient has had good diuresis.  Acute  kidney injury -Creatinine 1.23 from prior 0.95.  Improved.  Monitor  Hypokalemia -Replace.  Repeat a.m. labs  Non-small cell lung cancer -Follows with [facility] oncology.  Keytruda on hold recently for concerns for pneumonitis.  Sleep apnea -Wears CPAP at night at home  Obesity -Outpatient follow-up  Diabetes mellitus type 2 with hyperglycemia -A1c 6.  Continue CBGs with SSI  Essential hypertension -Monitor pressure.  Intermittently on lower side.  Continue on IV Lasix  DVT prophylaxis: Lovenox Code Status: Full Family Communication: None at bedside Disposition Plan: Status is: Inpatient Remains inpatient appropriate because: Of severity of illness  Consultants: Pulmonary/cardiology  Procedures: Echo  Antimicrobials: Doxycycline from 01/02/2022 onwards   Subjective: Patient seen and examined at bedside.  Denies chest pain, worsening abdominal pain, fever or vomiting.  Breathing is slightly improving.   Objective: Vitals:   01/03/22 0815 01/03/22 2016 01/03/22 2348 01/04/22 0433  BP: (!) 117/59 133/78 122/65 (!) 101/57  Pulse: 77 87 76 71  Resp: 20 18 14 12   Temp: 98.1 F (36.7 C) 98.2 F (36.8 C) 98 F (36.7 C) 98.3 F (36.8 C)  TempSrc: Oral Oral Axillary Axillary  SpO2: 97% 94% 95% 96%  Weight:      Height:        Intake/Output Summary (Last 24 hours) at 01/04/2022 0739 Last data filed at 01/03/2022 2000 Gross per 24 hour  Intake --  Output 1600 ml  Net -1600 ml    Filed Weights   01/01/22 2316 01/02/22 0500 01/03/22 0500  Weight: 95.1 kg 94.5 kg 94.7 kg    Examination:  General: No acute distress.  Looks chronically ill and deconditioned.  ENT/neck: No obvious JVD elevation or palpable  thyromegaly respiratory: Bilateral decreased breath sounds at bases with scattered crackles  CVS: Mostly rate controlled; S1 and S2 are heard  abdominal: Soft, obese, nontender, distended mildly; no organomegaly, bowel sounds are heard normally  extremities: No  clubbing; bilateral lower extremity edema present CNS: Awake and oriented.  Slow to respond.  No focal neurologic deficit.  Moving extremities Lymph: No cervical lymphadenopathy noted Skin: No obvious petechiae/rashes  psych: Mostly flat affect.  Currently not agitated musculoskeletal: No obvious joint swelling/deformity    Data Reviewed: I have personally reviewed following labs and imaging studies  CBC: Recent Labs  Lab 01/01/22 1643 01/01/22 1722 01/03/22 0448  WBC 7.9  --  9.8  NEUTROABS 6.8  --  8.2*  HGB 12.7 13.6 11.9*  HCT 40.8 40.0 38.4  MCV 102.0*  --  101.3*  PLT 168  --  295    Basic Metabolic Panel: Recent Labs  Lab 01/01/22 1643 01/01/22 1722 01/02/22 0346 01/03/22 0448 01/03/22 1511 01/04/22 0239  NA 140 141 143 143 140 142  K 4.0 3.8 3.5 2.7* 4.3 3.3*  CL 98  --  99 96* 96* 93*  CO2 29  --  34* 37* 34* 38*  GLUCOSE 170*  --  176* 101* 223* 127*  BUN 15  --  14 15 18 15   CREATININE 1.23*  --  1.04* 0.95 0.88 0.86  CALCIUM 8.9  --  8.6* 8.6* 8.7* 8.6*  MG 2.1  --   --  1.9  --  1.8    GFR: Estimated Creatinine Clearance: 64.3 mL/min (by C-G formula based on SCr of 0.86 mg/dL). Liver Function Tests: Recent Labs  Lab 01/01/22 1643  AST 15  ALT 9  ALKPHOS 47  BILITOT 1.0  PROT 6.5  ALBUMIN 3.3*    No results for input(s): "LIPASE", "AMYLASE" in the last 168 hours. No results for input(s): "AMMONIA" in the last 168 hours. Coagulation Profile: No results for input(s): "INR", "PROTIME" in the last 168 hours. Cardiac Enzymes: No results for input(s): "CKTOTAL", "CKMB", "CKMBINDEX", "TROPONINI" in the last 168 hours. BNP (last 3 results) No results for input(s): "PROBNP" in the last 8760 hours. HbA1C: Recent Labs    01/02/22 0346  HGBA1C 6.0*    CBG: Recent Labs  Lab 01/02/22 2035 01/03/22 0820 01/03/22 1220 01/03/22 1724 01/03/22 2208  GLUCAP 127* 95 120* 207* 151*    Lipid Profile: No results for input(s): "CHOL", "HDL",  "LDLCALC", "TRIG", "CHOLHDL", "LDLDIRECT" in the last 72 hours. Thyroid Function Tests: No results for input(s): "TSH", "T4TOTAL", "FREET4", "T3FREE", "THYROIDAB" in the last 72 hours. Anemia Panel: No results for input(s): "VITAMINB12", "FOLATE", "FERRITIN", "TIBC", "IRON", "RETICCTPCT" in the last 72 hours. Sepsis Labs: Recent Labs  Lab 01/02/22 0346  PROCALCITON 0.37     Recent Results (from the past 240 hour(s))  SARS Coronavirus 2 by RT PCR (hospital order, performed in [facility] hospital lab) *cepheid single result test* Anterior Nasal Swab     Status: None   Collection Time: 01/01/22 11:50 PM   Specimen: Anterior Nasal Swab  Result Value Ref Range Status   SARS Coronavirus 2 by RT PCR NEGATIVE NEGATIVE Final    Comment: (NOTE) SARS-CoV-2 target nucleic acids are NOT DETECTED.  The SARS-CoV-2 RNA is generally detectable in upper and lower respiratory specimens during the acute phase of infection. The lowest concentration of SARS-CoV-2 viral copies this assay can detect is 250 copies / mL. A negative result does not preclude SARS-CoV-2 infection and should not  be used as the sole basis for treatment or other patient management decisions.  A negative result may occur with improper specimen collection / handling, submission of specimen other than nasopharyngeal swab, presence of viral mutation(s) within the areas targeted by this assay, and inadequate number of viral copies (<250 copies / mL). A negative result must be combined with clinical observations, patient history, and epidemiological information.  Fact Sheet for Patients:   https://www.patel.info/  Fact Sheet for Healthcare Providers: https://hall.com/  This test is not yet approved or  cleared by the [place] FDA and has been authorized for detection and/or diagnosis of SARS-CoV-2 by FDA under an Emergency Use Authorization (EUA).  This EUA will remain in effect  (meaning this test can be used) for the duration of the COVID-19 declaration under Section 564(b)(1) of the Act, 21 U.S.C. section 360bbb-3(b)(1), unless the authorization is terminated or revoked sooner.  Performed at [facility], [place] [address], [place],  [address]          Radiology Studies: ECHOCARDIOGRAM COMPLETE  Result Date: 01/02/2022    ECHOCARDIOGRAM REPORT   Patient Name:   Charlene Houston Date of Exam: 01/02/2022 Medical Rec #:  [id number]               Height:       65.0 in Accession #:    [id number]              Weight:       208.3 lb Date of Birth:  Sep 13, 1945               BSA:          2.013 m Patient Age:    80 years                BP:           103/52 mmHg Patient Gender: F                       HR:           89 bpm. Exam Location:  Inpatient Procedure: 2D Echo Indications:    acute systolic chf.  History:        Patient has no prior history of Echocardiogram examinations.                 Risk Factors:Diabetes, Hypertension, Dyslipidemia and Sleep                 Apnea.  Sonographer:    Johny Chess RDCS Referring Phys: [id number] Concow T TU  Sonographer Comments: Patient is obese. Image acquisition challenging due to patient body habitus. IMPRESSIONS  1. Left ventricular ejection fraction, by estimation, is 35 to 40%. The left ventricle has moderately decreased function. The left ventricle demonstrates global hypokinesis. The left ventricular internal cavity size was moderately dilated. Left ventricular diastolic parameters are consistent with Grade I diastolic dysfunction (impaired relaxation).  2. Right ventricular systolic function is normal. The right ventricular size is normal.  3. The mitral valve is abnormal. Trivial mitral valve regurgitation. No evidence of mitral stenosis.  4. Nodular calcifiecation of non coronary cusp. The aortic valve is tricuspid. There is moderate calcification of the aortic valve. Aortic valve regurgitation is not visualized.  Aortic valve sclerosis is present, with no evidence of aortic valve stenosis.  5. The inferior vena cava is normal in size with greater than 50% respiratory variability, suggesting right atrial pressure of  3 mmHg. FINDINGS  Left Ventricle: Left ventricular ejection fraction, by estimation, is 35 to 40%. The left ventricle has moderately decreased function. The left ventricle demonstrates global hypokinesis. Definity contrast agent was given IV to delineate the left ventricular endocardial borders. The left ventricular internal cavity size was moderately dilated. There is no left ventricular hypertrophy. Left ventricular diastolic parameters are consistent with Grade I diastolic dysfunction (impaired relaxation). Right Ventricle: The right ventricular size is normal. No increase in right ventricular wall thickness. Right ventricular systolic function is normal. Left Atrium: Left atrial size was normal in size. Right Atrium: Right atrial size was normal in size. Pericardium: There is no evidence of pericardial effusion. Mitral Valve: The mitral valve is abnormal. There is mild thickening of the mitral valve leaflet(s). Trivial mitral valve regurgitation. No evidence of mitral valve stenosis. Tricuspid Valve: The tricuspid valve is normal in structure. Tricuspid valve regurgitation is mild . No evidence of tricuspid stenosis. Aortic Valve: Nodular calcifiecation of non coronary cusp. The aortic valve is tricuspid. There is moderate calcification of the aortic valve. Aortic valve regurgitation is not visualized. Aortic valve sclerosis is present, with no evidence of aortic valve stenosis. Pulmonic Valve: The pulmonic valve was normal in structure. Pulmonic valve regurgitation is not visualized. No evidence of pulmonic stenosis. Aorta: The aortic root is normal in size and structure. Venous: The inferior vena cava is normal in size with greater than 50% respiratory variability, suggesting right atrial pressure of 3 mmHg.  IAS/Shunts: No atrial level shunt detected by color flow Doppler.  LEFT VENTRICLE PLAX 2D LVIDd:         5.50 cm   Diastology LVIDs:         4.90 cm   LV e' medial:    5.55 cm/s LV PW:         1.10 cm   LV E/e' medial:  13.2 LV IVS:        1.10 cm   LV e' lateral:   7.83 cm/s LVOT diam:     2.00 cm   LV E/e' lateral: 9.4 LV SV:         59 LV SV Index:   29 LVOT Area:     3.14 cm  RIGHT VENTRICLE             IVC RV S prime:     12.50 cm/s  IVC diam: 1.70 cm TAPSE (M-mode): 3.2 cm LEFT ATRIUM             Index        RIGHT ATRIUM           Index LA diam:        3.40 cm 1.69 cm/m   RA Area:     18.00 cm LA Vol (A2C):   62.1 ml 30.85 ml/m  RA Volume:   54.40 ml  27.03 ml/m LA Vol (A4C):   52.5 ml 26.08 ml/m LA Biplane Vol: 58.0 ml 28.82 ml/m  AORTIC VALVE LVOT Vmax:   101.00 cm/s LVOT Vmean:  67.600 cm/s LVOT VTI:    0.187 m  AORTA Ao Root diam: 2.80 cm Ao Asc diam:  3.20 cm MV E velocity: 73.30 cm/s MV A velocity: 117.00 cm/s  SHUNTS MV E/A ratio:  0.63         Systemic VTI:  0.19 m  Systemic Diam: 2.00 cm Jenkins Rouge MD Electronically signed by Jenkins Rouge MD Signature Date/Time: 01/02/2022/11:51:18 AM    Final         Scheduled Meds:  dapagliflozin propanediol  10 mg Oral Daily   doxycycline  100 mg Oral Q12H   enoxaparin (LOVENOX) injection  40 mg Subcutaneous QHS   escitalopram  5 mg Oral Daily   furosemide  40 mg Intravenous BID   insulin aspart  0-9 Units Subcutaneous TID WC   losartan  25 mg Oral Daily   mirtazapine  15 mg Oral QHS   predniSONE  40 mg Oral Q breakfast   Continuous Infusions:         Aline August, MD [facility] 01/04/2022, 7:39 AM

## 2022-01-04 NOTE — Evaluation (Signed)
Physical Therapy Evaluation Patient Details Name: Charlene Houston MRN: [id number] DOB: [birth year] Today's Date: 01/04/2022  History of Present Illness  Pt is 76 year old presented to [facility] on  01/01/22 with SOB. Pt with acute respiratory failure due to drug induced pneumonitis and new onset CHF.  PMH - lung CA with current immunotherapy treatment, breast CA, HTN, OSA, DM, arthritis.  Clinical Impression  Pt admitted with above diagnosis and presents to PT with functional limitations due to deficits listed below (See PT problem list). Pt needs skilled PT to maximize independence and safety to allow discharge to home with assistance of daughter and home health. Pt with general debility/weakness. Expect she will make steady progress back toward baseline. Typically she lives with son who is a Administrator and is gone a lot. She says she will likely go stay with her daughter at dc.         Recommendations for follow up therapy are one component of a multi-disciplinary discharge planning process, led by the attending physician.  Recommendations may be updated based on patient status, additional functional criteria and insurance authorization.  Follow Up Recommendations Home health PT      Assistance Recommended at Discharge Frequent or constant Supervision/Assistance  Patient can return home with the following  A little help with walking and/or transfers;Help with stairs or ramp for entrance;Assistance with cooking/housework;A little help with bathing/dressing/bathroom    Equipment Recommendations None recommended by PT  Recommendations for Other Services       Functional Status Assessment Patient has had a recent decline in their functional status and demonstrates the ability to make significant improvements in function in a reasonable and predictable amount of time.     Precautions / Restrictions Precautions Precautions: Fall Restrictions Weight Bearing Restrictions: No       Mobility  Bed Mobility Overal bed mobility: Needs Assistance Bed Mobility: Rolling, Sidelying to Sit Rolling: Min assist Sidelying to sit: Min assist, HOB elevated       General bed mobility comments: Assist to elevate trunk into sitting    Transfers Overall transfer level: Needs assistance Equipment used: Rollator (4 wheels) Transfers: Sit to/from Stand Sit to Stand: Min guard           General transfer comment: Assist for safety    Ambulation/Gait Ambulation/Gait assistance: Min guard Gait Distance (Feet): 5 Feet Assistive device: Rollator (4 wheels) Gait Pattern/deviations: Step-through pattern, Decreased step length - right, Decreased step length - left, Shuffle Gait velocity: decr Gait velocity interpretation: <1.8 ft/sec, indicate of risk for recurrent falls   General Gait Details: Assist for safety and lines  Stairs            Wheelchair Mobility    Modified Rankin (Stroke Patients Only)       Balance Overall balance assessment: Needs assistance, History of Falls Sitting-balance support: No upper extremity supported, Feet supported Sitting balance-Leahy Scale: Good     Standing balance support: Bilateral upper extremity supported, During functional activity Standing balance-Leahy Scale: Poor Standing balance comment: rollator and min guard for static standing                             Pertinent Vitals/Pain Pain Assessment Pain Assessment: Faces Faces Pain Scale: Hurts even more Pain Location: back Pain Descriptors / Indicators: Guarding, Grimacing Pain Intervention(s): Limited activity within patient's tolerance, Repositioned, Monitored during session    Home Living Family/patient expects to be discharged to:: Private  residence Living Arrangements: Children Available Help at Discharge: Family;Available PRN/intermittently Type of Home: House Home Access: Stairs to enter Entrance Stairs-Rails: None Entrance  Stairs-Number of Steps: 1 Alternate Level Stairs-Number of Steps: flight Home Layout: Two level;Bed/bath upstairs Home Equipment: Rollator (4 wheels) Additional Comments: usually lives with son who is a Administrator and is gone from home frequently. Likely will go to daughter's home at dc. Above info for daughter's home    Prior Function Prior Level of Function : Independent/Modified Independent             Mobility Comments: Uses rollator. History of falls       Hand Dominance   Dominant Hand: Right    Extremity/Trunk Assessment   Upper Extremity Assessment Upper Extremity Assessment: Defer to OT evaluation    Lower Extremity Assessment Lower Extremity Assessment: Generalized weakness       Communication   Communication: No difficulties  Cognition Arousal/Alertness: Awake/alert Behavior During Therapy: WFL for tasks assessed/performed Overall Cognitive Status: Within Functional Limits for tasks assessed                                          General Comments General comments (skin integrity, edema, etc.): Pt on 1L O2 at rest with SpO2 97%. SpO2 90% on RA at rest. SpO2 85% on RA taking a few steps to chair. Replaced O2 at 1L    Exercises     Assessment/Plan    PT Assessment Patient needs continued PT services  PT Problem List Decreased strength;Decreased activity tolerance;Decreased balance;Decreased mobility;Pain       PT Treatment Interventions DME instruction;Gait training;Stair training;Therapeutic activities;Functional mobility training;Therapeutic exercise;Balance training;Patient/family education    PT Goals (Current goals can be found in the Care Plan section)  Acute Rehab PT Goals Patient Stated Goal: return home PT Goal Formulation: With patient Time For Goal Achievement: 01/18/22 Potential to Achieve Goals: Good    Frequency Min 3X/week     Co-evaluation               AM-PAC PT "6 Clicks" Mobility  Outcome  Measure Help needed turning from your back to your side while in a flat bed without using bedrails?: A Little Help needed moving from lying on your back to sitting on the side of a flat bed without using bedrails?: A Little Help needed moving to and from a bed to a chair (including a wheelchair)?: A Little Help needed standing up from a chair using your arms (e.g., wheelchair or bedside chair)?: A Little Help needed to walk in hospital room?: A Little (feel she could amb further) Help needed climbing 3-5 steps with a railing? : Total 6 Click Score: 16    End of Session Equipment Utilized During Treatment: Gait belt;Oxygen Activity Tolerance: Patient limited by fatigue Patient left: in chair;with call bell/phone within reach Nurse Communication: Mobility status PT Visit Diagnosis: Other abnormalities of gait and mobility (R26.89);History of falling (Z91.81);Muscle weakness (generalized) (M62.81)    Time: 8563-1497 PT Time Calculation (min) (ACUTE ONLY): 17 min   Charges:   PT Evaluation $PT Eval Moderate Complexity: 1 [place] [facility] [place] 01/04/2022, 12:15 PM

## 2022-01-04 NOTE — Progress Notes (Signed)
   NAME:  Charlene Houston, MRN:  [id number], DOB:  [birth year], LOS: 3 ADMISSION DATE:  01/01/2022, CONSULTATION DATE:  01/02/22 REFERRING MD:  Dr Starla Link, CHIEF COMPLAINT:  SOB   History of Present Illness:  Patient came in with worsening shortness of breath pedal edema Started on steroids, required BiPAP, elevated BNP at 2400 CT with atelectasis and bilateral pleural effusions.     She follows with [facility] oncology, admitted 8/24 with dyspnea and hypoxia to 70%, CTA negative for PE, suggested pneumonitis.  She was treated with steroid taper and Beryle Flock was stopped On follow-up visit on 9/14 she was noted to be hypoxic again to 86%, started on home oxygen and longer steroid taper was given       Pertinent  Medical History   breast cancer s/p right lumepctomy, chemo, XRT,  NSCLC of left lung on immunotherapy,  type 2 diabetes, hypertension, hyperlipidemia,  OSA on CPAP   Significant Hospital Events: Including procedures, antibiotic start and stop dates in addition to other pertinent events   9/22-CT scan of the chest shows atelectasis left base, bilateral pleural effusions.Multiple small pulmonary nodules unchanged from CT 06/2021 9/24 cardiology consult  Interim History / Subjective:   Breathing better. Used BiPAP overnight.  Down to 1 L nasal cannula Diuresed 4.2 L  Objective   Blood pressure 116/71, pulse 83, temperature (!) 97.5 F (36.4 C), temperature source Oral, resp. rate 12, height 5\' 5"  (1.651 m), weight 94.7 kg, SpO2 97 %.    FiO2 (%):  [24 %] 24 %   Intake/Output Summary (Last 24 hours) at 01/04/2022 4827 Last data filed at 01/03/2022 2000 Gross per 24 hour  Intake --  Output 1600 ml  Net -1600 ml    Filed Weights   01/01/22 2316 01/02/22 0500 01/03/22 0500  Weight: 95.1 kg 94.5 kg 94.7 kg    Examination: General: Elderly lady, no distress, chronically ill-appearing HENT: Moist oral mucosa Lungs: No accesory muscle use, rales right  base Cardiovascular: S1-S2 appreciated with no murmur Abdomen: Soft, bowel sounds appreciated Extremities: Mild peripheral edema 1+ Neuro: Awake and interactive, no focal findings   Labs show hypokalemia CT  angiogram 9/22 reviewed  Resolved Hospital Problem list     Assessment & Plan:  Drug-induced pneumonitis -Restarted on steroids -Doubt this is the acute issue , can maintain 40 mg to the point of discharge and then slow taper over 2 weeks  Cough, increased sputum production Mild fever at home -Complete course of doxycycline, low procalcitonin reassuring   Acute systolic heart failure with decompensation -Cardiology following, GDMT    PCCM available as needed   Kara Mead MD. [facility]. [place] Pager : [phone]  If no response to pager , please call [phone] until 7 pm After 7:00 pm call Elink  [phone]   01/04/2022

## 2022-01-04 NOTE — Progress Notes (Signed)
RT NOTE: RT taken off bipap and placed on 1L [place]. Patient tolerating well at this time with no increased WOB and sats of 99%. VSS. RT will continue to monitor.

## 2022-01-05 DIAGNOSIS — N179 Acute kidney failure, unspecified: Secondary | ICD-10-CM | POA: Diagnosis not present

## 2022-01-05 DIAGNOSIS — J189 Pneumonia, unspecified organism: Secondary | ICD-10-CM | POA: Diagnosis not present

## 2022-01-05 DIAGNOSIS — G4730 Sleep apnea, unspecified: Secondary | ICD-10-CM

## 2022-01-05 DIAGNOSIS — I5041 Acute combined systolic (congestive) and diastolic (congestive) heart failure: Secondary | ICD-10-CM | POA: Diagnosis not present

## 2022-01-05 DIAGNOSIS — J9601 Acute respiratory failure with hypoxia: Secondary | ICD-10-CM | POA: Diagnosis not present

## 2022-01-05 LAB — BASIC METABOLIC PANEL
Anion gap: 6 (ref 5–15)
BUN: 16 mg/dL (ref 8–23)
CO2: 41 mmol/L — ABNORMAL HIGH (ref 22–32)
Calcium: 8.4 mg/dL — ABNORMAL LOW (ref 8.9–10.3)
Chloride: 92 mmol/L — ABNORMAL LOW (ref 98–111)
Creatinine, Ser: 0.91 mg/dL (ref 0.44–1.00)
GFR, Estimated: >60 mL/min (ref >60)
Glucose, Bld: 132 mg/dL — ABNORMAL HIGH (ref 70–99)
Potassium: 3.6 mmol/L (ref 3.5–5.1)
Sodium: 139 mmol/L (ref 135–145)

## 2022-01-05 LAB — MAGNESIUM: Magnesium: 1.9 mg/dL (ref 1.7–2.4)

## 2022-01-05 LAB — GLUCOSE, CAPILLARY
Glucose-Capillary: 119 mg/dL — ABNORMAL HIGH (ref 70–99)
Glucose-Capillary: 171 mg/dL — ABNORMAL HIGH (ref 70–99)
Glucose-Capillary: 201 mg/dL — ABNORMAL HIGH (ref 70–99)
Glucose-Capillary: 261 mg/dL — ABNORMAL HIGH (ref 70–99)

## 2022-01-05 MED ORDER — SPIRONOLACTONE 12.5 MG HALF TABLET
12.5000 mg | ORAL_TABLET | Freq: Every day | ORAL | Status: DC
  Administered 2022-01-05 – 2022-01-06 (×2): 12.5 mg via ORAL
  Filled 2022-01-05 (×2): qty 1

## 2022-01-05 MED ORDER — POTASSIUM CHLORIDE CRYS ER 20 MEQ PO TBCR
40.0000 meq | EXTENDED_RELEASE_TABLET | Freq: Once | ORAL | Status: AC
  Administered 2022-01-05: 40 meq via ORAL
  Filled 2022-01-05: qty 2

## 2022-01-05 NOTE — Care Management Important Message (Signed)
Important Message  Patient Details  Name: Charlene Houston MRN: [id number] Date of Birth: 07-12-1945   Medicare Important Message Given:  Yes     Shelda Altes 01/05/2022, 11:11 AM

## 2022-01-05 NOTE — Evaluation (Signed)
Occupational Therapy Evaluation Patient Details Name: Charlene Houston MRN: [id number] DOB: 06-03-1945 Today's Date: 01/05/2022   History of Present Illness Pt is 76 year old presented to [facility] on  01/01/22 with SOB. Pt with acute respiratory failure due to drug induced pneumonitis and new onset CHF.  PMH - lung CA with current immunotherapy treatment, breast CA, HTN, OSA, DM, arthritis.   Clinical Impression   Pt reports independence at baseline with ADLs and use of rollator for mobility. Pt lives with son who works as a Administrator and is gone for days at a time, planning for daughter's home at d/c. Pt currently needing supervision-mod A for ADLs, min A for bed mobility, and min guard for transfers and short distance ambulation using RW. Pt fatigued after walking short distance around bed, SpO2 98% on 2L O2. Pt presenting with impairments listed below, will follow acutely. Recommend HHOT at d/c.     Recommendations for follow up therapy are one component of a multi-disciplinary discharge planning process, led by the attending physician.  Recommendations may be updated based on patient status, additional functional criteria and insurance authorization.   Follow Up Recommendations  Home health OT    Assistance Recommended at Discharge Intermittent Supervision/Assistance  Patient can return home with the following A little help with walking and/or transfers;A little help with bathing/dressing/bathroom;Assistance with cooking/housework;Direct supervision/assist for financial management;Direct supervision/assist for medications management;Assist for transportation;Help with stairs or ramp for entrance    Functional Status Assessment  Patient has had a recent decline in their functional status and demonstrates the ability to make significant improvements in function in a reasonable and predictable amount of time.  Equipment Recommendations  Tub/shower seat    Recommendations for Other  Services PT consult     Precautions / Restrictions Precautions Precautions: Fall Restrictions Weight Bearing Restrictions: No      Mobility Bed Mobility Overal bed mobility: Needs Assistance Bed Mobility: Sidelying to Sit   Sidelying to sit: Min assist, HOB elevated       General bed mobility comments: assist for LLE and trunk elevation    Transfers Overall transfer level: Needs assistance Equipment used: Rolling walker (2 wheels) Transfers: Sit to/from Stand, Bed to chair/wheelchair/BSC Sit to Stand: Min guard     Step pivot transfers: Min guard            Balance Overall balance assessment: Needs assistance, History of Falls Sitting-balance support: No upper extremity supported, Feet supported Sitting balance-Leahy Scale: Good     Standing balance support: Bilateral upper extremity supported, During functional activity Standing balance-Leahy Scale: Poor Standing balance comment: rollator and min guard for static standing                           ADL either performed or assessed with clinical judgement   ADL Overall ADL's : Needs assistance/impaired Eating/Feeding: Supervision/ safety   Grooming: Supervision/safety   Upper Body Bathing: Minimal assistance   Lower Body Bathing: Moderate assistance   Upper Body Dressing : Minimal assistance   Lower Body Dressing: Minimal assistance   Toilet Transfer: Min guard   Toileting- Clothing Manipulation and Hygiene: Maximal assistance Toileting - Clothing Manipulation Details (indicate cue type and reason): pericare     Functional mobility during ADLs: Min guard       Vision   Vision Assessment?: No apparent visual deficits     Perception     Praxis      Pertinent Vitals/Pain  Pain Assessment Pain Assessment: Faces Pain Score: 6  Faces Pain Scale: Hurts even more Pain Location: back Pain Descriptors / Indicators: Guarding, Grimacing Pain Intervention(s): Limited activity within  patient's tolerance, Monitored during session     Hand Dominance Right   Extremity/Trunk Assessment Upper Extremity Assessment Upper Extremity Assessment: Generalized weakness   Lower Extremity Assessment Lower Extremity Assessment: Defer to PT evaluation   Cervical / Trunk Assessment Cervical / Trunk Assessment: Normal   Communication Communication Communication: No difficulties   Cognition Arousal/Alertness: Awake/alert Behavior During Therapy: WFL for tasks assessed/performed Overall Cognitive Status: Within Functional Limits for tasks assessed                                       General Comments  VSS on 2L O2    Exercises     Shoulder Instructions      Home Living Family/patient expects to be discharged to:: Private residence Living Arrangements: Children Available Help at Discharge: Family;Available PRN/intermittently Type of Home: House Home Access: Stairs to enter Entrance Stairs-Number of Steps: 1 Entrance Stairs-Rails: None Home Layout: Two level;Bed/bath upstairs Alternate Level Stairs-Number of Steps: flight Alternate Level Stairs-Rails: Right Bathroom Shower/Tub: Teacher, early years/pre: Standard     Home Equipment: Rollator (4 wheels)   Additional Comments: usually lives with son who is a Administrator and is gone from home frequently. Likely will go to daughter's home at dc. Above info for daughter's home      Prior Functioning/Environment Prior Level of Function : Independent/Modified Independent;Driving             Mobility Comments: Uses rollator. History of falls ADLs Comments: does IADLs, med mgmt, grocery shopping        OT Problem List: Decreased strength;Decreased range of motion;Decreased activity tolerance;Impaired balance (sitting and/or standing)      OT Treatment/Interventions: Self-care/ADL training;Therapeutic exercise;DME and/or AE instruction;Energy conservation;Therapeutic  activities;Balance training;Patient/family education    OT Goals(Current goals can be found in the care plan section) Acute Rehab OT Goals Patient Stated Goal: none stated OT Goal Formulation: With patient Time For Goal Achievement: 01/19/22 Potential to Achieve Goals: Good ADL Goals Pt Will Perform Upper Body Dressing: with supervision;sitting Pt Will Perform Lower Body Dressing: with supervision;sit to/from stand Pt Will Perform Tub/Shower Transfer: Shower transfer;Tub transfer;ambulating;rolling walker;with supervision Additional ADL Goal #1: pt will be able to stand x5 min for functional task in order to improve activity tolerance for ADLs  OT Frequency: Min 2X/week    Co-evaluation              AM-PAC OT "6 Clicks" Daily Activity     Outcome Measure Help from another person eating meals?: None Help from another person taking care of personal grooming?: None Help from another person toileting, which includes using toliet, bedpan, or urinal?: A Little Help from another person bathing (including washing, rinsing, drying)?: A Lot Help from another person to put on and taking off regular upper body clothing?: A Little Help from another person to put on and taking off regular lower body clothing?: A Little 6 Click Score: 19   End of Session Equipment Utilized During Treatment: Gait belt;Rolling walker (2 wheels);Oxygen Nurse Communication: Mobility status  Activity Tolerance: Patient tolerated treatment well Patient left: in chair;with call bell/phone within reach  OT Visit Diagnosis: Unsteadiness on feet (R26.81);Other abnormalities of gait and mobility (R26.89);Muscle weakness (generalized) (M62.81)  Time: 4356-8616 OT Time Calculation (min): 33 min Charges:  OT General Charges $OT Visit: 1 Visit OT Evaluation $OT Eval Low Complexity: 1 Low OT Treatments $Self Care/Home Management : 8-22 mins  Lynnda Child, OTD, OTR/L Acute Rehab [phone]) [phone]  [place] 01/05/2022, 11:09 AM

## 2022-01-05 NOTE — Progress Notes (Signed)
    Subjective:  No chest pain dyspnea better    Objective:  Vitals:   01/05/22 0019 01/05/22 0358 01/05/22 0657 01/05/22 0757  BP: 105/74 120/74  126/75  Pulse: 75 75 77 81  Resp: 20 18  18   Temp: 97.6 F (36.4 C) 97.7 F (36.5 C)  97.7 F (36.5 C)  TempSrc: Axillary Axillary  Oral  SpO2: 96% 96% 98% 100%  Weight:   94.1 kg   Height:        Intake/Output from previous day:  Intake/Output Summary (Last 24 hours) at 01/05/2022 0826 Last data filed at 01/05/2022 1962 Gross per 24 hour  Intake --  Output 2120 ml  Net -2120 ml    Physical Exam: Obese black female Port under right clavicle CPAP basilar crackles  No murmur distant heart sounds Plus one edema   Lab Results: Basic Metabolic Panel: Recent Labs    01/04/22 0239 01/05/22 0208  NA 142 139  K 3.3* 3.6  CL 93* 92*  CO2 38* 41*  GLUCOSE 127* 132*  BUN 15 16  CREATININE 0.86 0.91  CALCIUM 8.6* 8.4*  MG 1.8 1.9   Liver Function Tests: No results for input(s): "AST", "ALT", "ALKPHOS", "BILITOT", "PROT", "ALBUMIN" in the last 72 hours.  No results for input(s): "LIPASE", "AMYLASE" in the last 72 hours. CBC: Recent Labs    01/03/22 0448  WBC 9.8  NEUTROABS 8.2*  HGB 11.9*  HCT 38.4  MCV 101.3*  PLT 157    Hemoglobin A1C: No results for input(s): "HGBA1C" in the last 72 hours.    Imaging: No results found.  Cardiac Studies:  ECG: ST rate 101 LBBB   Telemetry:NSR 01/05/2022   Echo: EF 35-40%   Medications:    doxycycline  100 mg Oral Q12H   empagliflozin  10 mg Oral Daily   enoxaparin (LOVENOX) injection  40 mg Subcutaneous QHS   escitalopram  5 mg Oral Daily   furosemide  40 mg Intravenous BID   insulin aspart  0-9 Units Subcutaneous TID WC   losartan  25 mg Oral Daily   mirtazapine  15 mg Oral QHS   potassium chloride  40 mEq Oral Once   predniSONE  40 mg Oral Q breakfast      Assessment/Plan:  Charlene Houston is a 76 y.o. female with a hx of breast cancer s/p right  lumpectomy/chemoradiation 2007, metastatic NSCLC on current immunotherapy with Keytruda, type 2 diabetes, hypertension, hyperlipidemia, OSA on CPAP, PVD, GERD, who is being seen 01/03/2022 for the evaluation of possible CHF at the request of Dr. Starla Link.  CHF:  acute systolic CHF not clear if related to previous chemo from breast cancer Added Jardiance losartan and lasix Good diuresis will need f/u echo in 3 months BP too soft for entresto Supplement K Cancer:  breast and now non small cell lung with METS and ? Pneumonitis from [facility] on steroids now CTA negative for PE back on CPAP Needs alternative Rx to [facility] with multiple pulmonary nodules similar to 06/10/21   Increase losartan to 50 mg today change to PO diuretics in am   Charlene Houston 01/05/2022, 8:26 AM

## 2022-01-05 NOTE — Progress Notes (Addendum)
PROGRESS NOTE    Charlene Houston  YIA:[id number] DOB: Feb 26, 1946 DOA: 01/01/2022 PCP: Lilian Coma., MD   Brief Narrative:  76 y.o. female with medical history significant of breast cancer s/p right lumepctomy status post chemo/XRT, NSCLC of left lung with Beryle Flock currently on hold because of concerns for drug-induced pneumonitis currently being treated as an outpatient with course of steroid taper followed by higher dose of prednisone from 12/24/2021 onwards, type 2 diabetes, hypertension, hyperlipidemia, OSA on CPAP presented with worsening shortness of breath.  She is in the process of getting supplemental oxygen at home.  Her Lasix dose was also recently increased because of worsening leg swelling.  On presentation, she was tachycardic and tachypneic and required BiPAP on presentation.  BNP was 2425.8. CTA chest was negative for PE although segmental and subsegmental branches limited due to respiratory motion artifact.  New bilateral small pleural effusion and compressive atelectasis.  Multiple bilateral pulmonary nodules concerning for metastatic disease similar to prior.  She was given IV Lasix.  Pulmonary was consulted.  Echo showed EF of 35 to 40% with grade 1 diastolic dysfunction.  Cardiology was consulted.  Assessment & Plan:   Acute respiratory failure with hypoxia Possible acute systolic and diastolic heart failure Drug-induced pneumonitis -Presented with hypoxia requiring BiPAP on presentation.  Possibly from combination of worsening of her pneumonitis caused by [facility] along with new onset CHF.  -Pulmonary signed off on 01/03/2022: Recommended prednisone for 7 to 10 days and 7 days of mg oral doxycycline.  Outpatient follow-up with pulmonary. -Cardiology following.  Currently on Lasix 40 mg IV every 12 hours.  Echo showed EF of 35 to 40% with grade 1 diastolic dysfunction.  Strict input output.  Daily weights.  Fluid restriction.  Patient has had good diuresis with a negative  balance of 6370 cc since admission.  Cardiology has also started the patient on Farxiga and losartan. -Currently on 1 L oxygen via nasal cannula.  Acute kidney injury -Creatinine 1.23 from prior 0.95.  Improved.  Monitor  Hypokalemia -Improving.  Continue replacement as patient is still on IV Lasix.  Repeat a.m. labs  Non-small cell lung cancer -Follows with [facility] oncology.  Keytruda on hold recently for concerns for pneumonitis.  Sleep apnea -Wears CPAP at night at home  Obesity -Outpatient follow-up  Diabetes mellitus type 2 with hyperglycemia -A1c 6.  Continue CBGs with SSI  Essential hypertension -Monitor pressure.  Currently stable.  Continue  IV Lasix and oral losartan  Physical deconditioning -Will need home health PT  DVT prophylaxis: Lovenox Code Status: Full Family Communication: None at bedside Disposition Plan: Status is: Inpatient Remains inpatient appropriate because: Of severity of illness.  Possible discharge in 1 to 2 days if clinically improves and cleared by cardiology.  Consultants: Pulmonary/cardiology  Procedures: Echo  Antimicrobials: Doxycycline from 01/02/2022 onwards   Subjective: Patient seen and examined at bedside.  Breathing is improving; still slightly short of breath with some exertion.  No overnight chest pain, vomiting or fever reported.    Objective: Vitals:   01/05/22 0019 01/05/22 0358 01/05/22 0657 01/05/22 0757  BP: 105/74 120/74  126/75  Pulse: 75 75 77 81  Resp: 20 18  18   Temp: 97.6 F (36.4 C) 97.7 F (36.5 C)  97.7 F (36.5 C)  TempSrc: Axillary Axillary  Oral  SpO2: 96% 96% 98% 100%  Weight:   94.1 kg   Height:        Intake/Output Summary (Last 24 hours) at 01/05/2022 [phone]  Last data filed at 01/05/2022 0709 Gross per 24 hour  Intake --  Output 2120 ml  Net -2120 ml    Filed Weights   01/02/22 0500 01/03/22 0500 01/05/22 0657  Weight: 94.5 kg 94.7 kg 94.1 kg    Examination:  General: On 1 L oxygen  via nasal cannula.  No distress.  Looks chronically ill and deconditioned.   ENT/neck: No obvious palpable neck masses or JVD elevation noted  respiratory: Decreased breath sounds at bases bilaterally with basilar crackles CVS: S1-S2 heard; rate controlled  abdominal: Soft, obese, nontender, still has some distention; no organomegaly, normal bowel sounds heard  extremities: lower extremity edema present bilaterally; no clubbing.  CNS: Still to respond.  Alert and awake.  No focal neurologic deficit.  Moves extremities Lymph: No palpable lymphadenopathy noted Skin: No no obvious lesions/petechiae  psych: Showing no signs of agitation currently.  Affect is very flat.  Musculoskeletal: No obvious joint swelling/deformity    Data Reviewed: I have personally reviewed following labs and imaging studies  CBC: Recent Labs  Lab 01/01/22 1643 01/01/22 1722 01/03/22 0448  WBC 7.9  --  9.8  NEUTROABS 6.8  --  8.2*  HGB 12.7 13.6 11.9*  HCT 40.8 40.0 38.4  MCV 102.0*  --  101.3*  PLT 168  --  517    Basic Metabolic Panel: Recent Labs  Lab 01/01/22 1643 01/01/22 1722 01/02/22 0346 01/03/22 0448 01/03/22 1511 01/04/22 0239 01/05/22 0208  NA 140   < > 143 143 140 142 139  K 4.0   < > 3.5 2.7* 4.3 3.3* 3.6  CL 98  --  99 96* 96* 93* 92*  CO2 29  --  34* 37* 34* 38* 41*  GLUCOSE 170*  --  176* 101* 223* 127* 132*  BUN 15  --  14 15 18 15 16   CREATININE 1.23*  --  1.04* 0.95 0.88 0.86 0.91  CALCIUM 8.9  --  8.6* 8.6* 8.7* 8.6* 8.4*  MG 2.1  --   --  1.9  --  1.8 1.9   < > = values in this interval not displayed.    GFR: Estimated Creatinine Clearance: 60.5 mL/min (by C-G formula based on SCr of 0.91 mg/dL). Liver Function Tests: Recent Labs  Lab 01/01/22 1643  AST 15  ALT 9  ALKPHOS 47  BILITOT 1.0  PROT 6.5  ALBUMIN 3.3*    No results for input(s): "LIPASE", "AMYLASE" in the last 168 hours. No results for input(s): "AMMONIA" in the last 168 hours. Coagulation  Profile: No results for input(s): "INR", "PROTIME" in the last 168 hours. Cardiac Enzymes: No results for input(s): "CKTOTAL", "CKMB", "CKMBINDEX", "TROPONINI" in the last 168 hours. BNP (last 3 results) No results for input(s): "PROBNP" in the last 8760 hours. HbA1C: No results for input(s): "HGBA1C" in the last 72 hours.  CBG: Recent Labs  Lab 01/03/22 2208 01/04/22 0828 01/04/22 1156 01/04/22 1615 01/04/22 2059  GLUCAP 151* 108* 186* 321* 159*    Lipid Profile: No results for input(s): "CHOL", "HDL", "LDLCALC", "TRIG", "CHOLHDL", "LDLDIRECT" in the last 72 hours. Thyroid Function Tests: No results for input(s): "TSH", "T4TOTAL", "FREET4", "T3FREE", "THYROIDAB" in the last 72 hours. Anemia Panel: No results for input(s): "VITAMINB12", "FOLATE", "FERRITIN", "TIBC", "IRON", "RETICCTPCT" in the last 72 hours. Sepsis Labs: Recent Labs  Lab 01/02/22 0346  PROCALCITON 0.37     Recent Results (from the past 240 hour(s))  SARS Coronavirus 2 by RT PCR (hospital order, performed in  [facility] hospital lab) *cepheid single result test* Anterior Nasal Swab     Status: None   Collection Time: 01/01/22 11:50 PM   Specimen: Anterior Nasal Swab  Result Value Ref Range Status   SARS Coronavirus 2 by RT PCR NEGATIVE NEGATIVE Final    Comment: (NOTE) SARS-CoV-2 target nucleic acids are NOT DETECTED.  The SARS-CoV-2 RNA is generally detectable in upper and lower respiratory specimens during the acute phase of infection. The lowest concentration of SARS-CoV-2 viral copies this assay can detect is 250 copies / mL. A negative result does not preclude SARS-CoV-2 infection and should not be used as the sole basis for treatment or other patient management decisions.  A negative result may occur with improper specimen collection / handling, submission of specimen other than nasopharyngeal swab, presence of viral mutation(s) within the areas targeted by this assay, and inadequate number of  viral copies (<250 copies / mL). A negative result must be combined with clinical observations, patient history, and epidemiological information.  Fact Sheet for Patients:   https://www.patel.info/  Fact Sheet for Healthcare Providers: https://hall.com/  This test is not yet approved or  cleared by the [place] FDA and has been authorized for detection and/or diagnosis of SARS-CoV-2 by FDA under an Emergency Use Authorization (EUA).  This EUA will remain in effect (meaning this test can be used) for the duration of the COVID-19 declaration under Section 564(b)(1) of the Act, 21 U.S.C. section 360bbb-3(b)(1), unless the authorization is terminated or revoked sooner.  Performed at [facility], [place] [address], [place], [place] [address]          Radiology Studies: No results found.      Scheduled Meds:  doxycycline  100 mg Oral Q12H   empagliflozin  10 mg Oral Daily   enoxaparin (LOVENOX) injection  40 mg Subcutaneous QHS   escitalopram  5 mg Oral Daily   furosemide  40 mg Intravenous BID   insulin aspart  0-9 Units Subcutaneous TID WC   losartan  25 mg Oral Daily   mirtazapine  15 mg Oral QHS   predniSONE  40 mg Oral Q breakfast   Continuous Infusions:         Aline August, MD [facility] 01/05/2022, 8:06 AM

## 2022-01-05 NOTE — Progress Notes (Signed)
Physical Therapy Treatment Patient Details Name: Charlene Houston MRN: [id number] DOB: 11-20-1945 Today's Date: 01/05/2022   History of Present Illness Pt is 76 year old presented to [facility] on  01/01/22 with SOB. Pt with acute respiratory failure due to drug induced pneumonitis and new onset CHF.  PMH - lung CA with current immunotherapy treatment, breast CA, HTN, OSA, DM, arthritis.    PT Comments    Pt making steady progress with mobility. Fatigues quickly and requiring O2 at rest (87%) and with activity (85%). Pt plans to go to daughter's home at [place].    Recommendations for follow up therapy are one component of a multi-disciplinary discharge planning process, led by the attending physician.  Recommendations may be updated based on patient status, additional functional criteria and insurance authorization.  Follow Up Recommendations  Home health PT     Assistance Recommended at Discharge Intermittent Supervision/Assistance  Patient can return home with the following A little help with walking and/or transfers;Help with stairs or ramp for entrance;Assistance with cooking/housework;A little help with bathing/dressing/bathroom   Equipment Recommendations  None recommended by PT    Recommendations for Other Services       Precautions / Restrictions Precautions Precautions: Fall     Mobility  Bed Mobility Overal bed mobility: Needs Assistance Bed Mobility: Sidelying to Sit, Sit to Supine   Sidelying to sit: Min assist, HOB elevated   Sit to supine: Min assist   General bed mobility comments: Assist to elevate trunk into sitting and bring legs back up into bed returning to supine    Transfers Overall transfer level: Needs assistance Equipment used: Rollator (4 wheels) Transfers: Sit to/from Stand Sit to Stand: Min guard           General transfer comment: Assist for safety    Ambulation/Gait Ambulation/Gait assistance: Min guard, Min assist Gait Distance  (Feet): 25 Feet Assistive device: Rollator (4 wheels) Gait Pattern/deviations: Step-through pattern, Decreased step length - right, Decreased step length - left, Shuffle Gait velocity: decr Gait velocity interpretation: <1.8 ft/sec, indicate of risk for recurrent falls   General Gait Details: Assist for safety with one posterior loss of balance requiring min assist   Stairs             Wheelchair Mobility    Modified Rankin (Stroke Patients Only)       Balance Overall balance assessment: Needs assistance, History of Falls Sitting-balance support: No upper extremity supported, Feet supported Sitting balance-Leahy Scale: Good     Standing balance support: Bilateral upper extremity supported, During functional activity Standing balance-Leahy Scale: Poor Standing balance comment: rollator and min guard for static standing                            Cognition Arousal/Alertness: Awake/alert Behavior During Therapy: WFL for tasks assessed/performed Overall Cognitive Status: Within Functional Limits for tasks assessed                                          Exercises      General Comments General comments (skin integrity, edema, etc.): See separate note for SpO2      Pertinent Vitals/Pain Pain Assessment Pain Assessment: Faces Faces Pain Scale: Hurts little more Pain Location: back Pain Descriptors / Indicators: Guarding, Grimacing Pain Intervention(s): Limited activity within patient's tolerance, Repositioned    Home Living  Prior Function            PT Goals (current goals can now be found in the care plan section) Acute Rehab PT Goals Patient Stated Goal: return home Progress towards PT goals: Progressing toward goals    Frequency    Min 3X/week      PT Plan Current plan remains appropriate    Co-evaluation              AM-PAC PT "6 Clicks" Mobility   Outcome Measure   Help needed turning from your back to your side while in a flat bed without using bedrails?: A Little Help needed moving from lying on your back to sitting on the side of a flat bed without using bedrails?: A Little Help needed moving to and from a bed to a chair (including a wheelchair)?: A Little Help needed standing up from a chair using your arms (e.g., wheelchair or bedside chair)?: A Little Help needed to walk in hospital room?: A Little (feel she could amb further) Help needed climbing 3-5 steps with a railing? : Total 6 Click Score: 16    End of Session Equipment Utilized During Treatment: Gait belt;Oxygen Activity Tolerance: Patient limited by fatigue Patient left: with call bell/phone within reach;in bed;with bed alarm set;with family/visitor present Nurse Communication: Mobility status PT Visit Diagnosis: Other abnormalities of gait and mobility (R26.89);History of falling (Z91.81);Muscle weakness (generalized) (M62.81)     Time: 0569-7948 PT Time Calculation (min) (ACUTE ONLY): 21 min  Charges:  $Gait Training: 8-22 mins                     [place] Office [place] 01/05/2022, 5:07 PM

## 2022-01-05 NOTE — TOC Initial Note (Signed)
Transition of Care [facility]) - Initial/Assessment Note    Patient Details  Name: Charlene Houston MRN: [id number] Date of Birth: 08/24/1945  Transition [facility] CM/SW Contact:    Bethena Roys, RN Phone Number: 01/05/2022, 5:14 PM  Clinical Narrative: Patient presented for respiratory distress. PTA patient was from home alone. Daughter lives next door. Patient has DME rolling walker, hospital bed, and bedside commode. Patient is agreeable to oxygen via [employer]. Order submitted via [employer] and will be delivered to the room on 01-06-22. Case Manager discussed home health services and patient is agreeable to home health PT services. Patient did not have an agency preference and states okay to call [facility] for services. Referral submitted to [facility] and they can service the patient. Start of care to begin within 24-48 hours post transition home. Case Manager will continue to follow for additional transition of care needs as the patient progresses.                  Expected Discharge Plan: [place] Barriers to Discharge: Continued Medical Work up   Patient Goals and CMS Choice Patient states their goals for this hospitalization and ongoing recovery are:: to return home CMS Medicare.gov Compare Post Acute Care list provided to:: Patient Choice offered to / list presented to : Patient, Adult Children  Expected Discharge Plan and Services Expected Discharge Plan: [place] In-house Referral: NA Discharge Planning Services: CM Consult Post Acute Care Choice: [place] arrangements for the past 2 months: Single Family Home (townhome)                 DME Arranged: Oxygen DME Agency: [employer] Date DME Agency Contacted: 01/05/22 Time DME Agency Contacted: [phone] Representative spoke with at DME Agency: [place]: PT [place]: [place] Date [place]: 01/05/22 Time HH Agency Contacted:  34 Representative spoke with at [place]: [place] Arrangements/Services Living arrangements for the past 2 months: [place] (townhome) Lives with:: Self (Daughter lives next door for support.) Patient language and need for interpreter reviewed:: Yes Do you feel safe going back to the place where you live?: Yes      Need for Family Participation in Patient Care: Yes (Comment) Care giver support system in place?: Yes (comment) Current home services: DME (Patient has hospital bed, rolling walker and bedside commode in the home.) Criminal Activity/Legal Involvement Pertinent to Current Situation/Hospitalization: No - Comment as needed  Activities of Daily Living      Permission Sought/Granted Permission sought to share information with : Family Supports, Customer service manager, Case Optician, dispensing granted to share information with : Yes, Verbal Permission Granted     Permission granted to share info w AGENCY: [facility]        Emotional Assessment Appearance:: Appears stated age Attitude/Demeanor/Rapport: Engaged Affect (typically observed): Appropriate Orientation: : Oriented to Situation, Oriented to  Time, Oriented to Place, Oriented to Self Alcohol / Substance Use: Not Applicable Psych Involvement: No (comment)  Admission diagnosis:  Acute respiratory failure with hypoxia ([place]) [J96.01] Patient Active Problem List   Diagnosis Date Noted   Drug-induced pneumonitis    Acute combined systolic and diastolic congestive heart failure ([place]) 01/03/2022   Type 2 diabetes mellitus ([place]) 01/01/2022   AKI (acute kidney injury) ([place]) 01/01/2022   Dizziness 06/10/2021   Acute respiratory failure with hypoxia ([place]) 06/10/2021   Hypokalemia 06/10/2021   Hypomagnesemia 06/10/2021  GERD (gastroesophageal reflux disease) 06/10/2021   Non-small cell lung cancer ([place]) 06/10/2021   COVID-19 virus infection 04/23/2020   Hypoglycemia 04/22/2020    Rhabdomyolysis 04/22/2020   Essential hypertension 04/22/2020   HLD (hyperlipidemia) 04/22/2020   Sleep apnea 04/22/2020   Diabetes mellitus type 2 in nonobese ([place]) 04/22/2020   SBO (small bowel obstruction) ([place]) 07/06/2018   PCP:  Lilian Coma., MD Pharmacy:   [place], [address] [facility] [place] [address] Phone: [phone] Fax: [phone]  [facility] [address] [place] [address] Phone: [phone] Fax: [phone]   Social Determinants of Health (SDOH) Interventions Food Insecurity Interventions: Intervention Not Indicated Housing Interventions: Intervention Not Indicated Transportation Interventions: Intervention Not Indicated Utilities Interventions: Intervention Not Indicated Alcohol Usage Interventions: Intervention Not Indicated (Score <7) Financial Strain Interventions: Intervention Not Indicated  Readmission Risk Interventions     No data to display

## 2022-01-05 NOTE — Progress Notes (Signed)
SATURATION QUALIFICATIONS: (This note is used to comply with regulatory documentation for home oxygen)  Patient Saturations on Room Air at Rest = 87%  Patient Saturations on Room Air while Ambulating = 85%  Patient Saturations on 2 Liters of oxygen while Ambulating = 94%  Please briefly explain why patient needs home oxygen:Unable to maintain adequate level oxygenation at rest and with activity without supplemental O2.  [place] Office [phone]

## 2022-01-05 NOTE — Progress Notes (Signed)
Heart Failure Stewardship Pharmacist Progress Note   PCP: Lilian Coma., MD PCP-Cardiologist: None    HPI:  76 yo F with PMH of breast cancer, metastatic NSCLC, T2DM, HTN, HLD, and OSA.  Recently, her lasix was increased as outpatient because of LE edema. She was also taken off Keytruda due to concerns of drug-induced pneumonitis.   She presented to the ED on 9/22 with shortness of breath. BNP elevated at 2425.8. CXR with stable cardiomegaly. CTA negative for PE, new bilateral pleural effusions and concern for metastatic pulmonary nodules. ECHO 9/23 showed LVEF 35-40%, G1DD, RV normal, and trivial MR.   Current HF Medications: Diuretic: furosemide 40 mg IV BID ACE/ARB/ARNI: losartan 25 mg daily SGLT2i: Jardiance 10 mg daily  Prior to admission HF Medications: Diuretic: furosemide 20 mg daily ACE/ARB/ARNI: losartan 25 mg daily *also on midodrine 5 mg TID  Pertinent Lab Values: Serum creatinine 0.91, BUN 16, Potassium 3.6, Sodium 139, BNP 2425.8, Magnesium 1.9, A1c 6.0   Vital Signs: Weight: 207 lbs (admission weight: 208 lbs) Blood pressure: 110-120/70s  Heart rate: 70-80s  I/O: -1.8L yesterday; net incomplete  Medication Assistance / Insurance Benefits Check: Does the patient have prescription insurance?  Yes Type of insurance plan: [employer] Medicare  Does the patient qualify for medication assistance through manufacturers or grants?   Pending Eligible grants and/or patient assistance programs: pending Medication assistance applications in progress: none  Medication assistance applications approved: none Approved medication assistance renewals will be completed by: pending  Outpatient Pharmacy:  Prior to admission outpatient pharmacy: [facility] Is the patient willing to use [place] pharmacy at discharge? Yes Is the patient willing to transition their outpatient pharmacy to utilize a [facility] outpatient pharmacy?   Pending    Assessment: 1. Acute systolic CHF (LVEF  87-68%), due to presumed NICM. NYHA class III symptoms. - Continue furosemide 40 mg IV BID. Strict I/Os and daily weights. KCl 40 mEq x 1 ordered for replacement. Keep K>4 and Mag>2. - Consider adding metoprolol XL once euvolemic - Continue losartan 25 mg daily. Unclear if she would tolerate escalation to [facility]. - Consider adding spironolactone 12.5 mg daily. BP soft but should be able to tolerate low dose.  - Continue Jardiance 10 mg daily - On midodrine at home for hypotension. Currently on hold now. Monitor closely.    Plan: 1) Medication changes recommended at this time: - Add spironolactone 12.5 mg daily  2) Patient assistance: - Farxiga copay $95 - Jardiance copay $45  3)  Education  - To be completed prior to discharge  Kerby Nora, PharmD, BCPS Heart Failure Stewardship Pharmacist Phone [phone]

## 2022-01-06 ENCOUNTER — Other Ambulatory Visit (HOSPITAL_COMMUNITY): Payer: Self-pay

## 2022-01-06 DIAGNOSIS — J9601 Acute respiratory failure with hypoxia: Secondary | ICD-10-CM | POA: Diagnosis not present

## 2022-01-06 DIAGNOSIS — I5041 Acute combined systolic (congestive) and diastolic (congestive) heart failure: Secondary | ICD-10-CM | POA: Diagnosis not present

## 2022-01-06 LAB — MAGNESIUM: Magnesium: 1.9 mg/dL (ref 1.7–2.4)

## 2022-01-06 LAB — BASIC METABOLIC PANEL
Anion gap: 12 (ref 5–15)
BUN: 16 mg/dL (ref 8–23)
CO2: 39 mmol/L — ABNORMAL HIGH (ref 22–32)
Calcium: 8.8 mg/dL — ABNORMAL LOW (ref 8.9–10.3)
Chloride: 90 mmol/L — ABNORMAL LOW (ref 98–111)
Creatinine, Ser: 0.76 mg/dL (ref 0.44–1.00)
GFR, Estimated: >60 mL/min (ref >60)
Glucose, Bld: 147 mg/dL — ABNORMAL HIGH (ref 70–99)
Potassium: 3.6 mmol/L (ref 3.5–5.1)
Sodium: 141 mmol/L (ref 135–145)

## 2022-01-06 LAB — GLUCOSE, CAPILLARY: Glucose-Capillary: 133 mg/dL — ABNORMAL HIGH (ref 70–99)

## 2022-01-06 MED ORDER — SPIRONOLACTONE 25 MG PO TABS
12.5000 mg | ORAL_TABLET | Freq: Every day | ORAL | 0 refills | Status: DC
  Filled 2022-01-06: qty 15, 30d supply, fill #0

## 2022-01-06 MED ORDER — PREDNISONE 20 MG PO TABS
40.0000 mg | ORAL_TABLET | Freq: Every day | ORAL | 0 refills | Status: DC
  Filled 2022-01-06: qty 6, 3d supply, fill #0

## 2022-01-06 MED ORDER — FUROSEMIDE 20 MG PO TABS
20.0000 mg | ORAL_TABLET | Freq: Every day | ORAL | Status: DC
  Administered 2022-01-06: 20 mg via ORAL
  Filled 2022-01-06: qty 1

## 2022-01-06 MED ORDER — EMPAGLIFLOZIN 10 MG PO TABS
10.0000 mg | ORAL_TABLET | Freq: Every day | ORAL | 0 refills | Status: DC
  Filled 2022-01-06: qty 30, 30d supply, fill #0

## 2022-01-06 MED ORDER — DOXYCYCLINE HYCLATE 100 MG PO TABS
100.0000 mg | ORAL_TABLET | Freq: Two times a day (BID) | ORAL | 0 refills | Status: AC
  Filled 2022-01-06: qty 8, 4d supply, fill #0

## 2022-01-06 NOTE — Discharge Summary (Signed)
Physician Discharge Summary  Charlene Houston DXI:[id number] DOB: 08/08/1945 DOA: 01/01/2022  PCP: Lilian Coma., MD  Admit date: 01/01/2022 Discharge date: 01/06/2022  Admitted From: home Disposition:  home  Recommendations for Outpatient Follow-up:  Follow up with PCP in 1-2 weeks Please obtain BMP/CBC in one week Follow-up with oncology as an outpatient  Home Health: PT Equipment/Devices: home O2, 2L  Discharge Condition: stable CODE STATUS: Full code  HPI: Per admitting MD, Charlene Houston is a 76 y.o. 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 She follows with [facility] hematology and was seen 12/03/21 with dyspnea and hypoxia to 70%. She was negative for COVID and had CTA negative for PE but showed edema vs pneumonitis. She was started on steroid taper and immunotherapy was dicontinued due to concerns of immunotherapy-induced pneumonitis. She followed up 12/24/21 and was hypoxia to 86% and advised to start O2 supplement at home and started on longer steroid taper.  Has still been working to get her oxygen at home. She felt improved with her first course of steroid although overall would say that her respiratory status was worse after discontinuation of Keytruda.  Reports productive cough and chest tightness.  No fever.  No nausea, vomiting or diarrhea.  Has had worsening bilateral lower extremity edema, feels tightness to her abdomen and has swelling in her hands. She is on CPAP at night but has stopped it for for several weeks since she was feeling ill and did not have time to clean her machine. In the ED, she was afebrile, mildly tachycardic and tachypneic requiring BiPAP. VBG with metabolic alkalosis pH 7.67, CO2 48, bicarb 35.  No leukocytosis or anemia. Mild AKI of  1.33 up from 0.95 and upward trending anion gap at 13. BNP was elevated 2425, troponin of 30. CTA chest was negative for PE although segmental and subsegmental branches limited due to respiratory motion artifact.  New bilateral small pleural effusion and compressive atelectasis.  Multiple bilateral pulmonary nodules concerning for metastatic disease similar to prior.  Hospital Course / Discharge diagnoses: Principal Problem:   Acute respiratory failure with hypoxia (HCC) Active Problems:   Essential hypertension   HLD (hyperlipidemia)   Sleep apnea   Non-small cell lung cancer (HCC)   Type 2 diabetes mellitus ([place])   AKI (acute kidney injury) ([place])   Acute combined systolic and diastolic congestive heart failure (HCC)   Drug-induced pneumonitis  Principal problem Acute respiratory failure with hypoxia, Possible acute systolic and diastolic heart failure, Drug-induced pneumonitis - Presented with hypoxia requiring BiPAP on presentation.  Possibly from combination of worsening of her pneumonitis caused by [facility] along with new onset CHF.  Pulmonary consulted and evaluated patient, recommending prednisone for 7 to 10 days and 7 days of oral doxycycline.  Cardiology also consulted, she has been diuresed with Lasix, now converted to p.o.  Clinically stable, appears euvolemic, will be discharged home with outpatient follow-up.  Cardiology also started patient on farxiga and losartan.  She does remain hypoxic at times with ambulation requiring 2 L on discharge. Echo showed EF of 35 to 40% with grade 1 diastolic dysfunction.   Active problems Acute kidney injury -Creatinine 1.23 from prior 0.95.  Return to baseline, 0.7 on discharge Hypokalemia -Improving.  Non-small cell lung cancer -Follows with [facility] oncology.  Keytruda on hold recently  for concerns for pneumonitis. Sleep apnea -Wears CPAP at night at home Obesity, class 1 -Outpatient follow-up Diabetes mellitus type 2 with hyperglycemia -A1c  6.  Essential hypertension -Monitor pressure.  Currently stable.  Physical deconditioning -Will need home health PT  Sepsis ruled out   Discharge Instructions   Allergies as of 01/06/2022       Reactions   Prozac [fluoxetine Hcl] Other (See Comments)   Gittery   Vit D-vit E-safflower Oil Other (See Comments)   Not specified        Medication List     STOP taking these medications    azithromycin 250 MG tablet Commonly known as: ZITHROMAX   midodrine 5 MG tablet Commonly known as: PROAMATINE       TAKE these medications    acetaminophen 325 MG tablet Commonly known as: TYLENOL Take 650 mg by mouth every 6 (six) hours as needed for mild pain or headache.   albuterol 108 (90 Base) MCG/ACT inhaler Commonly known as: VENTOLIN HFA Inhale 1-2 puffs into the lungs every 6 (six) hours as needed for wheezing or shortness of breath.   albuterol 1.25 MG/3ML nebulizer solution Commonly known as: ACCUNEB Take 3 mLs by nebulization every 6 (six) hours as needed for wheezing.   carboxymethylcellulose 0.5 % Soln Commonly known as: REFRESH PLUS Place 1 drop into both eyes daily as needed (dry eyes).   cyanocobalamin 1000 MCG/ML injection Commonly known as: VITAMIN B12 Inject 1,000 mcg into the muscle every 30 (thirty) days.   desonide 0.05 % cream Commonly known as: DesOwen Apply topically 2 (two) times daily. What changed:  how much to take when to take this reasons to take this   doxycycline 100 MG tablet Commonly known as: VIBRA-TABS Take 1 tablet (100 mg total) by mouth every 12 (twelve) hours for 4 days.   empagliflozin 10 MG Tabs tablet Commonly known as: JARDIANCE Take 1 tablet (10 mg total) by mouth daily. Start taking on: January 07, 2022   Ergocalciferol 10 MCG (400 UNIT) Tabs Take 800-1,200 Units by mouth See admin instructions. Taking 2 tabs ( 800 units) on Sunday and 3 tabs (1200) units daily except Sunday.   escitalopram 5 MG tablet Commonly  known as: LEXAPRO Take 5 mg by mouth daily.   fluticasone 50 MCG/ACT nasal spray Commonly known as: FLONASE Place 1 spray into both nostrils as needed for allergies or rhinitis.   folic acid 1 MG tablet Commonly known as: FOLVITE Take 1 mg by mouth daily.   furosemide 20 MG tablet Commonly known as: LASIX Take 1 tablet (20 mg total) by mouth daily.   gabapentin 300 MG capsule Commonly known as: NEURONTIN Take 300 mg by mouth 2 (two) times daily. Taking twice daily   glimepiride 2 MG tablet Commonly known as: AMARYL Take 2 mg by mouth daily.   losartan 25 MG tablet Commonly known as: COZAAR Take 1 tablet (25 mg total) by mouth daily.   mirtazapine 15 MG tablet Commonly known as: REMERON Take 1 tablet (15 mg total) by mouth at bedtime.   montelukast 10 MG tablet Commonly known as: SINGULAIR Take 50 mg by mouth daily.   potassium chloride SA 20 MEQ tablet Commonly known as: KLOR-CON M Take 20 mEq by mouth daily.   predniSONE 20 MG tablet Commonly known as: DELTASONE Take 2 tablets (40 mg total) by mouth daily with breakfast. Start taking on: January 07, 2022 What changed:  how much to take when to take this  simvastatin 40 MG tablet Commonly known as: ZOCOR Take 40 mg by mouth at bedtime.   spironolactone 25 MG tablet Commonly known as: ALDACTONE Take 0.5 tablets (12.5 mg total) by mouth daily. Start taking on: January 07, 2022   triamcinolone cream 0.5 % Commonly known as: KENALOG Apply 1 application topically 3 (three) times daily.               Durable Medical Equipment  (From admission, onward)           Start     Ordered   01/06/22 1141  For home use only DME oxygen  Once       Question Answer Comment  Length of Need Lifetime   Mode or (Route) Nasal cannula   Liters per Minute 2   Oxygen delivery system Gas      01/06/22 1141            Follow-up Information     [facility].  Go in 7 day(s).   Specialty: Cardiology Why: Hospital follow up PLEASE bring a current medication list to appointment FREE valet parking, Entrance C, Off [employer] information: [address] [phone] [place], [facility] Follow up.   Specialty: Home Health Services Why: Physical Tehrapy-office to call with visit times. Contact information: [address] [place] [address] [phone]         [facility] Follow up.   Why: Oxygen to be delivered to the room Contact information: [place] [address] [phone]                 Consultations: Cardiology   Procedures/Studies:  ECHOCARDIOGRAM COMPLETE  Result Date: 01/02/2022    ECHOCARDIOGRAM REPORT   Patient Name:   Charlene Houston Date of Exam: 01/02/2022 Medical Rec #:  [id number]               Height:       65.0 in Accession #:    [id number]              Weight:       208.3 lb Date of Birth:  [birth year]               BSA:          2.013 m Patient Age:    76 years                BP:           103/52 mmHg Patient Gender: F                       HR:           89 bpm. Exam Location:  Inpatient Procedure: 2D Echo Indications:    acute systolic chf.  History:        Patient has no prior history of Echocardiogram examinations.                 Risk Factors:Diabetes, Hypertension, Dyslipidemia and Sleep                 Apnea.  Sonographer:    Johny Chess RDCS Referring Phys: [id number] [place] T TU  Sonographer Comments: Patient is obese. Image acquisition challenging due to patient body habitus. IMPRESSIONS  1. Left ventricular ejection fraction, by estimation, is 35  to 40%. The left ventricle has moderately decreased function. The left ventricle demonstrates global hypokinesis. The left ventricular internal cavity size was moderately dilated. Left ventricular diastolic parameters are  consistent with Grade I diastolic dysfunction (impaired relaxation).  2. Right ventricular systolic function is normal. The right ventricular size is normal.  3. The mitral valve is abnormal. Trivial mitral valve regurgitation. No evidence of mitral stenosis.  4. Nodular calcifiecation of non coronary cusp. The aortic valve is tricuspid. There is moderate calcification of the aortic valve. Aortic valve regurgitation is not visualized. Aortic valve sclerosis is present, with no evidence of aortic valve stenosis.  5. The inferior vena cava is normal in size with greater than 50% respiratory variability, suggesting right atrial pressure of 3 mmHg. FINDINGS  Left Ventricle: Left ventricular ejection fraction, by estimation, is 35 to 40%. The left ventricle has moderately decreased function. The left ventricle demonstrates global hypokinesis. Definity contrast agent was given IV to delineate the left ventricular endocardial borders. The left ventricular internal cavity size was moderately dilated. There is no left ventricular hypertrophy. Left ventricular diastolic parameters are consistent with Grade I diastolic dysfunction (impaired relaxation). Right Ventricle: The right ventricular size is normal. No increase in right ventricular wall thickness. Right ventricular systolic function is normal. Left Atrium: Left atrial size was normal in size. Right Atrium: Right atrial size was normal in size. Pericardium: There is no evidence of pericardial effusion. Mitral Valve: The mitral valve is abnormal. There is mild thickening of the mitral valve leaflet(s). Trivial mitral valve regurgitation. No evidence of mitral valve stenosis. Tricuspid Valve: The tricuspid valve is normal in structure. Tricuspid valve regurgitation is mild . No evidence of tricuspid stenosis. Aortic Valve: Nodular calcifiecation of non coronary cusp. The aortic valve is tricuspid. There is moderate calcification of the aortic valve. Aortic valve  regurgitation is not visualized. Aortic valve sclerosis is present, with no evidence of aortic valve stenosis. Pulmonic Valve: The pulmonic valve was normal in structure. Pulmonic valve regurgitation is not visualized. No evidence of pulmonic stenosis. Aorta: The aortic root is normal in size and structure. Venous: The inferior vena cava is normal in size with greater than 50% respiratory variability, suggesting right atrial pressure of 3 mmHg. IAS/Shunts: No atrial level shunt detected by color flow Doppler.  LEFT VENTRICLE PLAX 2D LVIDd:         5.50 cm   Diastology LVIDs:         4.90 cm   LV e' medial:    5.55 cm/s LV PW:         1.10 cm   LV E/e' medial:  13.2 LV IVS:        1.10 cm   LV e' lateral:   7.83 cm/s LVOT diam:     2.00 cm   LV E/e' lateral: 9.4 LV SV:         59 LV SV Index:   29 LVOT Area:     3.14 cm  RIGHT VENTRICLE             IVC RV S prime:     12.50 cm/s  IVC diam: 1.70 cm TAPSE (M-mode): 3.2 cm LEFT ATRIUM             Index        RIGHT ATRIUM           Index LA diam:        3.40 cm 1.69 cm/m   RA Area:  18.00 cm LA Vol (A2C):   62.1 ml 30.85 ml/m  RA Volume:   54.40 ml  27.03 ml/m LA Vol (A4C):   52.5 ml 26.08 ml/m LA Biplane Vol: 58.0 ml 28.82 ml/m  AORTIC VALVE LVOT Vmax:   101.00 cm/s LVOT Vmean:  67.600 cm/s LVOT VTI:    0.187 m  AORTA Ao Root diam: 2.80 cm Ao Asc diam:  3.20 cm MV E velocity: 73.30 cm/s MV A velocity: 117.00 cm/s  SHUNTS MV E/A ratio:  0.63         Systemic VTI:  0.19 m                             Systemic Diam: 2.00 cm Jenkins Rouge MD Electronically signed by Jenkins Rouge MD Signature Date/Time: 01/02/2022/11:51:18 AM    Final    CT Angio Chest PE W and/or Wo Contrast  Result Date: 01/01/2022 CLINICAL DATA:  Respiratory distress starting 1 hour ago diminished lung sounds; PE suspected high probability EXAM: CT ANGIOGRAPHY CHEST WITH CONTRAST TECHNIQUE: Multidetector CT imaging of the chest was performed using the standard protocol during bolus  administration of intravenous contrast. Multiplanar CT image reconstructions and MIPs were obtained to evaluate the vascular anatomy. RADIATION DOSE REDUCTION: This exam was performed according to the departmental dose-optimization program which includes automated exposure control, adjustment of the mA and/or kV according to patient size and/or use of iterative reconstruction technique. CONTRAST:  79mL OMNIPAQUE IOHEXOL 350 MG/ML SOLN COMPARISON:  Radiographs earlier today and CT chest 06/10/2021 FINDINGS: Cardiovascular: Satisfactory opacification of the central pulmonary arteries. Evaluation of the segmental and subsegmental branches is limited due to respiratory motion artifact. No pulmonary embolism identified. Cardiomegaly. Coronary artery calcification. Aortic calcification. No pericardial effusion. Right chest wall Port-A-Cath. Mediastinum/Nodes: No enlarged mediastinal, hilar, or axillary lymph nodes. Thyroid gland, trachea, and esophagus demonstrate no significant findings. Lungs/Pleura: New small bilateral pleural effusions and compressive atelectasis. Numerous pulmonary nodules are redemonstrated again measuring up to 1.3 cm in the medial right lower lobe (series 7/image 69). Similar patchy ground-glass opacities bilaterally which are nonspecific and may be due to edema, infection, or metastatic disease. Upper Abdomen: No acute abnormality. Musculoskeletal: No chest wall abnormality. No acute osseous findings. Surgical clips right breast. Review of the MIP images confirms the above findings. IMPRESSION: 1. No CT evidence of pulmonary embolus. 2. New small bilateral pleural effusions and compressive atelectasis. 3. Multiple bilateral pulmonary nodules concerning for metastatic disease grossly similar to 06/10/2021. 4. Cardiomegaly and coronary artery vascular calcification. Electronically Signed   By: Placido Sou M.D.   On: 01/01/2022 20:10   DG Chest Port 1 View  Result Date: 01/01/2022 CLINICAL  DATA:  Shortness of breath EXAM: PORTABLE CHEST 1 VIEW COMPARISON:  Chest x-ray 07/16/2021 FINDINGS: Right chest port catheter tip projects over the distal SVC. The heart is enlarged, unchanged. There are minimal bibasilar opacities favored is atelectasis. No pleural effusion or pneumothorax. No acute fractures. IMPRESSION: 1. Minimal bibasilar atelectasis. 2. Stable cardiomegaly. Electronically Signed   By: Ronney Asters M.D.   On: 01/01/2022 17:44     Subjective: - no chest pain, shortness of breath, no abdominal pain, nausea or vomiting.   Discharge Exam: BP 99/60 (BP Location: Right Wrist)   Pulse 84   Temp 98.1 F (36.7 C) (Oral)   Resp 20   Ht 5\' 5"  (1.651 m)   Wt 95.2 kg   SpO2 94%   BMI 34.93  kg/m   General: Pt is alert, awake, not in acute distress Cardiovascular: RRR, S1/S2 +, no rubs, no gallops Respiratory: CTA bilaterally, no wheezing, no rhonchi Abdominal: Soft, NT, ND, bowel sounds + Extremities: no edema, no cyanosis   The results of significant diagnostics from this hospitalization (including imaging, microbiology, ancillary and laboratory) are listed below for reference.     Microbiology: Recent Results (from the past 240 hour(s))  SARS Coronavirus 2 by RT PCR (hospital order, performed in [facility] hospital lab) *cepheid single result test* Anterior Nasal Swab     Status: None   Collection Time: 01/01/22 11:50 PM   Specimen: Anterior Nasal Swab  Result Value Ref Range Status   SARS Coronavirus 2 by RT PCR NEGATIVE NEGATIVE Final    Comment: (NOTE) SARS-CoV-2 target nucleic acids are NOT DETECTED.  The SARS-CoV-2 RNA is generally detectable in upper and lower respiratory specimens during the acute phase of infection. The lowest concentration of SARS-CoV-2 viral copies this assay can detect is 250 copies / mL. A negative result does not preclude SARS-CoV-2 infection and should not be used as the sole basis for treatment or other patient management  decisions.  A negative result may occur with improper specimen collection / handling, submission of specimen other than nasopharyngeal swab, presence of viral mutation(s) within the areas targeted by this assay, and inadequate number of viral copies (<250 copies / mL). A negative result must be combined with clinical observations, patient history, and epidemiological information.  Fact Sheet for Patients:   https://www.patel.info/  Fact Sheet for Healthcare Providers: https://hall.com/  This test is not yet approved or  cleared by the [place] FDA and has been authorized for detection and/or diagnosis of SARS-CoV-2 by FDA under an Emergency Use Authorization (EUA).  This EUA will remain in effect (meaning this test can be used) for the duration of the COVID-19 declaration under Section 564(b)(1) of the Act, 21 U.S.C. section 360bbb-3(b)(1), unless the authorization is terminated or revoked sooner.  Performed at [facility], [place] [address], [place], [place] [address]      Labs: Basic Metabolic Panel: Recent Labs  Lab 01/01/22 1643 01/01/22 1722 01/03/22 0448 01/03/22 1511 01/04/22 0239 01/05/22 0208 01/06/22 0316  NA 140   < > 143 140 142 139 141  K 4.0   < > 2.7* 4.3 3.3* 3.6 3.6  CL 98   < > 96* 96* 93* 92* 90*  CO2 29   < > 37* 34* 38* 41* 39*  GLUCOSE 170*   < > 101* 223* 127* 132* 147*  BUN 15   < > 15 18 15 16 16   CREATININE 1.23*   < > 0.95 0.88 0.86 0.91 0.76  CALCIUM 8.9   < > 8.6* 8.7* 8.6* 8.4* 8.8*  MG 2.1  --  1.9  --  1.8 1.9 1.9   < > = values in this interval not displayed.   Liver Function Tests: Recent Labs  Lab 01/01/22 1643  AST 15  ALT 9  ALKPHOS 47  BILITOT 1.0  PROT 6.5  ALBUMIN 3.3*   CBC: Recent Labs  Lab 01/01/22 1643 01/01/22 1722 01/03/22 0448  WBC 7.9  --  9.8  NEUTROABS 6.8  --  8.2*  HGB 12.7 13.6 11.9*  HCT 40.8 40.0 38.4  MCV 102.0*  --  101.3*  PLT 168  --  157    CBG: Recent Labs  Lab 01/05/22 0801 01/05/22 1209 01/05/22 1615 01/05/22 2111 01/06/22  0739  GLUCAP 119* 171* 201* 261* 133*   Hgb A1c No results for input(s): "HGBA1C" in the last 72 hours. Lipid Profile No results for input(s): "CHOL", "HDL", "LDLCALC", "TRIG", "CHOLHDL", "LDLDIRECT" in the last 72 hours. Thyroid function studies No results for input(s): "TSH", "T4TOTAL", "T3FREE", "THYROIDAB" in the last 72 hours.  Invalid input(s): "FREET3" Urinalysis    Component Value Date/Time   COLORURINE STRAW (A) 07/05/2021 1325   APPEARANCEUR CLEAR 07/05/2021 1325   LABSPEC 1.003 (L) 07/05/2021 1325   PHURINE 5.0 07/05/2021 1325   GLUCOSEU NEGATIVE 07/05/2021 1325   HGBUR NEGATIVE 07/05/2021 1325   BILIRUBINUR NEGATIVE 07/05/2021 1325   KETONESUR NEGATIVE 07/05/2021 1325   PROTEINUR NEGATIVE 07/05/2021 1325   NITRITE NEGATIVE 07/05/2021 1325   LEUKOCYTESUR NEGATIVE 07/05/2021 1325    FURTHER DISCHARGE INSTRUCTIONS:   Get Medicines reviewed and adjusted: Please take all your medications with you for your next visit with your Primary MD   Laboratory/radiological data: Please request your Primary MD to go over all hospital tests and procedure/radiological results at the follow up, please ask your Primary MD to get all Hospital records sent to his/her office.   In some cases, they will be blood work, cultures and biopsy results pending at the time of your discharge. Please request that your primary care M.D. goes through all the records of your hospital data and follows up on these results.   Also Note the following: If you experience worsening of your admission symptoms, develop shortness of breath, life threatening emergency, suicidal or homicidal thoughts you must seek medical attention immediately by calling 911 or calling your MD immediately  if symptoms less severe.   You must read complete instructions/literature along with all the possible adverse reactions/side  effects for all the Medicines you take and that have been prescribed to you. Take any new Medicines after you have completely understood and accpet all the possible adverse reactions/side effects.    Do not drive when taking Pain medications or sleeping medications (Benzodaizepines)   Do not take more than prescribed Pain, Sleep and Anxiety Medications. It is not advisable to combine anxiety,sleep and pain medications without talking with your primary care practitioner   Special Instructions: If you have smoked or chewed Tobacco  in the last 2 yrs please stop smoking, stop any regular Alcohol  and or any Recreational drug use.   Wear Seat belts while driving.   Please note: You were cared for by a hospitalist during your hospital stay. Once you are discharged, your primary care physician will handle any further medical issues. Please note that NO REFILLS for any discharge medications will be authorized once you are discharged, as it is imperative that you return to your primary care physician (or establish a relationship with a primary care physician if you do not have one) for your post hospital discharge needs so that they can reassess your need for medications and monitor your lab values.  Time coordinating discharge: 40 minutes  SIGNED:  Marzetta Board, MD, PhD 01/06/2022, 11:50 AM

## 2022-01-06 NOTE — Progress Notes (Signed)
Heart Failure Stewardship Pharmacist Progress Note   PCP: Lilian Coma., MD PCP-Cardiologist: None    HPI:  76 yo F with PMH of breast cancer, metastatic NSCLC, T2DM, HTN, HLD, and OSA.  Recently, her lasix was increased as outpatient because of LE edema. She was also taken off Keytruda due to concerns of drug-induced pneumonitis.   She presented to the ED on 9/22 with shortness of breath. BNP elevated at 2425.8. CXR with stable cardiomegaly. CTA negative for PE, new bilateral pleural effusions and concern for metastatic pulmonary nodules. ECHO 9/23 showed LVEF 35-40%, G1DD, RV normal, and trivial MR.   Current HF Medications: Diuretic: furosemide 20 mg daily ACE/ARB/ARNI: losartan 25 mg daily MRA: spironolactone 12.5 mg daily SGLT2i: Jardiance 10 mg daily  Prior to admission HF Medications: Diuretic: furosemide 20 mg daily ACE/ARB/ARNI: losartan 25 mg daily *also on midodrine 5 mg TID  Pertinent Lab Values: Serum creatinine 0.76, BUN 16, Potassium 3.6, Sodium 141, BNP 2425.8, Magnesium 1.9, A1c 6.0   Vital Signs: Weight: 209 lbs (admission weight: 208 lbs) Blood pressure: 100-120/70s  Heart rate: 70-80s  I/O: -2L yesterday; net incomplete  Medication Assistance / Insurance Benefits Check: Does the patient have prescription insurance?  Yes Type of insurance plan: [employer] Medicare  Does the patient qualify for medication assistance through manufacturers or grants?   Pending Eligible grants and/or patient assistance programs: pending Medication assistance applications in progress: none  Medication assistance applications approved: none Approved medication assistance renewals will be completed by: pending  Outpatient Pharmacy:  Prior to admission outpatient pharmacy: [facility] Is the patient willing to use [facility] at discharge? Yes Is the patient willing to transition their outpatient pharmacy to utilize a [facility] outpatient pharmacy?   Pending     Assessment: 1. Acute systolic CHF (LVEF 81-10%), due to presumed NICM. NYHA class III symptoms. - Agree with transitioning to furosemide 20 mg PO daily. Strict I/Os and daily weights. KCl 40 mEq x 1 ordered for replacement. Keep K>4 and Mag>2. - Consider adding low dose metoprolol XL before discharge - Continue losartan 25 mg daily. BP soft to optimize to [facility]. - Continue spironolactone 12.5 mg daily - Continue Jardiance 10 mg daily - On midodrine at home for hypotension. Currently on hold now. Monitor closely.    Plan: 1) Medication changes recommended at this time: - Add low dose metoprolol XL prior to discharge  2) Patient assistance: - Farxiga copay $95 - Jardiance copay $45  3)  Education  - To be completed prior to discharge  Kerby Nora, PharmD, BCPS Heart Failure Stewardship Pharmacist Phone [phone]

## 2022-01-06 NOTE — Plan of Care (Signed)
  Problem: Education: Goal: Knowledge of General Education information will improve Description: Including pain rating scale, medication(s)/side effects and non-pharmacologic comfort measures Outcome: Adequate for Discharge   Problem: Health Behavior/Discharge Planning: Goal: Ability to manage health-related needs will improve Outcome: Adequate for Discharge   Problem: Clinical Measurements: Goal: Ability to maintain clinical measurements within normal limits will improve Outcome: Adequate for Discharge Goal: Will remain free from infection Outcome: Adequate for Discharge Goal: Diagnostic test results will improve Outcome: Adequate for Discharge Goal: Respiratory complications will improve Outcome: Adequate for Discharge Goal: Cardiovascular complication will be avoided Outcome: Adequate for Discharge   Problem: Activity: Goal: Risk for activity intolerance will decrease Outcome: Adequate for Discharge   Problem: Nutrition: Goal: Adequate nutrition will be maintained Outcome: Adequate for Discharge   Problem: Coping: Goal: Level of anxiety will decrease Outcome: Adequate for Discharge   Problem: Elimination: Goal: Will not experience complications related to bowel motility Outcome: Adequate for Discharge Goal: Will not experience complications related to urinary retention Outcome: Adequate for Discharge   Problem: Pain Managment: Goal: General experience of comfort will improve Outcome: Adequate for Discharge   Problem: Safety: Goal: Ability to remain free from injury will improve Outcome: Adequate for Discharge   Problem: Skin Integrity: Goal: Risk for impaired skin integrity will decrease Outcome: Adequate for Discharge   Problem: Education: Goal: Ability to demonstrate management of disease process will improve Outcome: Adequate for Discharge Goal: Ability to verbalize understanding of medication therapies will improve Outcome: Adequate for Discharge Goal:  Individualized Educational Video(s) Outcome: Adequate for Discharge   Problem: Activity: Goal: Capacity to carry out activities will improve Outcome: Adequate for Discharge   Problem: Cardiac: Goal: Ability to achieve and maintain adequate cardiopulmonary perfusion will improve Outcome: Adequate for Discharge   Problem: Education: Goal: Ability to describe self-care measures that may prevent or decrease complications (Diabetes Survival Skills Education) will improve Outcome: Adequate for Discharge Goal: Individualized Educational Video(s) Outcome: Adequate for Discharge   Problem: Coping: Goal: Ability to adjust to condition or change in health will improve Outcome: Adequate for Discharge   Problem: Fluid Volume: Goal: Ability to maintain a balanced intake and output will improve Outcome: Adequate for Discharge   Problem: Health Behavior/Discharge Planning: Goal: Ability to identify and utilize available resources and services will improve Outcome: Adequate for Discharge Goal: Ability to manage health-related needs will improve Outcome: Adequate for Discharge   Problem: Metabolic: Goal: Ability to maintain appropriate glucose levels will improve Outcome: Adequate for Discharge   Problem: Nutritional: Goal: Maintenance of adequate nutrition will improve Outcome: Adequate for Discharge Goal: Progress toward achieving an optimal weight will improve Outcome: Adequate for Discharge   Problem: Skin Integrity: Goal: Risk for impaired skin integrity will decrease Outcome: Adequate for Discharge   Problem: Tissue Perfusion: Goal: Adequacy of tissue perfusion will improve Outcome: Adequate for Discharge   

## 2022-01-06 NOTE — Progress Notes (Signed)
Occupational Therapy Treatment Patient Details Name: Charlene Houston MRN: [id number] DOB: 06/30/1945 Today's Date: 01/06/2022   History of present illness Pt is 76 year old presented to [facility] on  01/01/22 with SOB. Pt with acute respiratory failure due to drug induced pneumonitis and new onset CHF.  PMH - lung CA with current immunotherapy treatment, breast CA, HTN, OSA, DM, arthritis.   OT comments  Pt progressing towards goals, able to complete seated grooming task and short distance ambulation with min guard A and RW. Pt reports she plans to return home, but her daughter will come over daily to assist with IADLs. VSS on supplemental O2 throughout session, pt with impairments listed below, will follow acutely. Continue to recommend HHOT at d/c.   Recommendations for follow up therapy are one component of a multi-disciplinary discharge planning process, led by the attending physician.  Recommendations may be updated based on patient status, additional functional criteria and insurance authorization.    Follow Up Recommendations  Home health OT    Assistance Recommended at Discharge Intermittent Supervision/Assistance  Patient can return home with the following  A little help with walking and/or transfers;A little help with bathing/dressing/bathroom;Assistance with cooking/housework;Direct supervision/assist for financial management;Direct supervision/assist for medications management;Assist for transportation;Help with stairs or ramp for entrance   Equipment Recommendations  Tub/shower seat    Recommendations for Other Services PT consult    Precautions / Restrictions Precautions Precautions: Fall Restrictions Weight Bearing Restrictions: No       Mobility Bed Mobility Overal bed mobility: Needs Assistance Bed Mobility: Sidelying to Sit   Sidelying to sit: Min guard       General bed mobility comments: incr time    Transfers Overall transfer level: Needs  assistance Equipment used: Rolling walker (2 wheels) Transfers: Sit to/from Stand Sit to Stand: Min guard           General transfer comment: Assist for safety     Balance Overall balance assessment: Needs assistance, History of Falls Sitting-balance support: No upper extremity supported, Feet supported Sitting balance-Leahy Scale: Good     Standing balance support: Bilateral upper extremity supported, During functional activity Standing balance-Leahy Scale: Poor                             ADL either performed or assessed with clinical judgement   ADL Overall ADL's : Needs assistance/impaired     Grooming: Wash/dry face;Sitting;Min guard                   Toilet Transfer: Min guard;Rolling walker (2 wheels);Ambulation;Regular Glass blower/designer Details (indicate cue type and reason): simulated via functional mobility         Functional mobility during ADLs: Min guard      Extremity/Trunk Assessment Upper Extremity Assessment Upper Extremity Assessment: Generalized weakness   Lower Extremity Assessment Lower Extremity Assessment: Defer to PT evaluation        Vision   Vision Assessment?: No apparent visual deficits   Perception Perception Perception: Not tested   Praxis Praxis Praxis: Not tested    Cognition Arousal/Alertness: Awake/alert Behavior During Therapy: WFL for tasks assessed/performed Overall Cognitive Status: Within Functional Limits for tasks assessed                                          Exercises  Shoulder Instructions       General Comments VSS on supplemental O2    Pertinent Vitals/ Pain       Pain Assessment Pain Assessment: No/denies pain Pain Location: back Pain Descriptors / Indicators: Guarding, Grimacing Pain Intervention(s): Limited activity within patient's tolerance  Home Living                                          Prior  Functioning/Environment              Frequency  Min 2X/week        Progress Toward Goals  OT Goals(current goals can now be found in the care plan section)  Progress towards OT goals: Progressing toward goals  Acute Rehab OT Goals Patient Stated Goal: none stated OT Goal Formulation: With patient Time For Goal Achievement: 01/19/22 Potential to Achieve Goals: Good ADL Goals Pt Will Perform Upper Body Dressing: with supervision;sitting Pt Will Perform Lower Body Dressing: with supervision;sit to/from stand Pt Will Perform Tub/Shower Transfer: Shower transfer;Tub transfer;ambulating;rolling walker;with supervision Additional ADL Goal #1: pt will be able to stand x5 min for functional task in order to improve activity tolerance for ADLs  Plan Discharge plan remains appropriate;Frequency remains appropriate    Co-evaluation                 AM-PAC OT "6 Clicks" Daily Activity     Outcome Measure   Help from another person eating meals?: None Help from another person taking care of personal grooming?: None Help from another person toileting, which includes using toliet, bedpan, or urinal?: A Little Help from another person bathing (including washing, rinsing, drying)?: A Lot Help from another person to put on and taking off regular upper body clothing?: A Little Help from another person to put on and taking off regular lower body clothing?: A Little 6 Click Score: 19    End of Session Equipment Utilized During Treatment: Gait belt;Rolling walker (2 wheels);Oxygen  OT Visit Diagnosis: Unsteadiness on feet (R26.81);Other abnormalities of gait and mobility (R26.89);Muscle weakness (generalized) (M62.81)   Activity Tolerance Patient tolerated treatment well   Patient Left in chair;with call bell/phone within reach;with family/visitor present   Nurse Communication Mobility status        Time: 3159-4585 OT Time Calculation (min): 29 min  Charges: OT General  Charges $OT Visit: 1 Visit OT Treatments $Self Care/Home Management : 8-22 mins $Therapeutic Activity: 8-22 mins  Lynnda Child, OTD, OTR/L Acute Rehab [phone]) [phone] [place] 01/06/2022, 12:19 PM

## 2022-01-06 NOTE — Progress Notes (Signed)
    Subjective:  No chest pain dyspnea better  Ambulated in hall yesterday  Objective:  Vitals:   01/05/22 1948 01/05/22 2248 01/06/22 0415 01/06/22 0800  BP: 123/71  109/63 99/60  Pulse: 88 80 76 84  Resp:  16  20  Temp: 98.6 F (37 C)  (!) 97.4 F (36.3 C) 98.1 F (36.7 C)  TempSrc: Oral  Oral Oral  SpO2: 96% 96% 96% 94%  Weight:   95.2 kg   Height:        Intake/Output from previous day:  Intake/Output Summary (Last 24 hours) at 01/06/2022 0841 Last data filed at 01/06/2022 0412 Gross per 24 hour  Intake 260 ml  Output 2150 ml  Net -1890 ml    Physical Exam: Obese black female Port under right clavicle CPAP basilar crackles  No murmur distant heart sounds Plus one edema   Lab Results: Basic Metabolic Panel: Recent Labs    01/05/22 0208 01/06/22 0316  NA 139 141  K 3.6 3.6  CL 92* 90*  CO2 41* 39*  GLUCOSE 132* 147*  BUN 16 16  CREATININE 0.91 0.76  CALCIUM 8.4* 8.8*  MG 1.9 1.9       Imaging: No results found.  Cardiac Studies:  ECG: ST rate 101 LBBB   Telemetry:NSR 01/06/2022   Echo: EF 35-40%   Medications:    doxycycline  100 mg Oral Q12H   empagliflozin  10 mg Oral Daily   enoxaparin (LOVENOX) injection  40 mg Subcutaneous QHS   escitalopram  5 mg Oral Daily   furosemide  40 mg Intravenous BID   insulin aspart  0-9 Units Subcutaneous TID WC   losartan  25 mg Oral Daily   mirtazapine  15 mg Oral QHS   predniSONE  40 mg Oral Q breakfast   spironolactone  12.5 mg Oral Daily      Assessment/Plan:  Charlene Houston is a 76 y.o. 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 Starla Link.  CHF:  acute systolic CHF not clear if related to previous chemo from breast cancer Added Jardiance losartan,  aldactone and lasix Good diuresis will need f/u echo in 3 months BP too soft for entresto Supplement K Change to lower dose oral lasix as BP soft  Cancer:  breast and now non small cell lung with METS and ? Pneumonitis from [facility] on steroids now CTA negative for PE back on CPAP Needs alternative Rx to [facility] CT with multiple pulmonary nodules similar to 06/10/21    Jenkins Rouge 01/06/2022, 8:41 AM

## 2022-01-13 ENCOUNTER — Ambulatory Visit (HOSPITAL_COMMUNITY)
Admit: 2022-01-13 | Discharge: 2022-01-13 | Disposition: A | Discharge status: Home / Self Care | Payer: Medicare HMO | Attending: Cardiology | Admitting: Cardiology

## 2022-01-13 ENCOUNTER — Telehealth (HOSPITAL_COMMUNITY): Payer: Self-pay

## 2022-01-13 ENCOUNTER — Encounter (HOSPITAL_COMMUNITY): Payer: Self-pay

## 2022-01-13 VITALS — BP 150/100 | HR 90 | Wt 220.4 lb

## 2022-01-13 DIAGNOSIS — I447 Left bundle-branch block, unspecified: Secondary | ICD-10-CM | POA: Diagnosis not present

## 2022-01-13 DIAGNOSIS — J9611 Chronic respiratory failure with hypoxia: Secondary | ICD-10-CM | POA: Diagnosis not present

## 2022-01-13 DIAGNOSIS — Z9221 Personal history of antineoplastic chemotherapy: Secondary | ICD-10-CM | POA: Diagnosis not present

## 2022-01-13 DIAGNOSIS — Z7984 Long term (current) use of oral hypoglycemic drugs: Secondary | ICD-10-CM | POA: Insufficient documentation

## 2022-01-13 DIAGNOSIS — Z7952 Long term (current) use of systemic steroids: Secondary | ICD-10-CM | POA: Diagnosis not present

## 2022-01-13 DIAGNOSIS — Z923 Personal history of irradiation: Secondary | ICD-10-CM | POA: Diagnosis not present

## 2022-01-13 DIAGNOSIS — I2510 Atherosclerotic heart disease of native coronary artery without angina pectoris: Secondary | ICD-10-CM | POA: Diagnosis not present

## 2022-01-13 DIAGNOSIS — Z79899 Other long term (current) drug therapy: Secondary | ICD-10-CM | POA: Insufficient documentation

## 2022-01-13 DIAGNOSIS — Z853 Personal history of malignant neoplasm of breast: Secondary | ICD-10-CM | POA: Diagnosis not present

## 2022-01-13 DIAGNOSIS — I110 Hypertensive heart disease with heart failure: Secondary | ICD-10-CM | POA: Diagnosis present

## 2022-01-13 DIAGNOSIS — C3490 Malignant neoplasm of unspecified part of unspecified bronchus or lung: Secondary | ICD-10-CM | POA: Insufficient documentation

## 2022-01-13 DIAGNOSIS — I5042 Chronic combined systolic (congestive) and diastolic (congestive) heart failure: Secondary | ICD-10-CM | POA: Diagnosis not present

## 2022-01-13 DIAGNOSIS — E1151 Type 2 diabetes mellitus with diabetic peripheral angiopathy without gangrene: Secondary | ICD-10-CM | POA: Insufficient documentation

## 2022-01-13 DIAGNOSIS — G4733 Obstructive sleep apnea (adult) (pediatric): Secondary | ICD-10-CM | POA: Insufficient documentation

## 2022-01-13 DIAGNOSIS — I5043 Acute on chronic combined systolic (congestive) and diastolic (congestive) heart failure: Secondary | ICD-10-CM | POA: Diagnosis present

## 2022-01-13 LAB — BASIC METABOLIC PANEL
Anion gap: 6 (ref 5–15)
BUN: 8 mg/dL (ref 8–23)
CO2: 40 mmol/L — ABNORMAL HIGH (ref 22–32)
Calcium: 9.1 mg/dL (ref 8.9–10.3)
Chloride: 94 mmol/L — ABNORMAL LOW (ref 98–111)
Creatinine, Ser: 0.86 mg/dL (ref 0.44–1.00)
GFR, Estimated: >60 mL/min (ref >60)
Glucose, Bld: 60 mg/dL — ABNORMAL LOW (ref 70–99)
Potassium: 3.8 mmol/L (ref 3.5–5.1)
Sodium: 140 mmol/L (ref 135–145)

## 2022-01-13 MED ORDER — FUROSCIX 80 MG/10ML ~~LOC~~ CTKT: 80.0000 mg | CARTRIDGE | SUBCUTANEOUS | 0 refills | Status: DC

## 2022-01-13 MED ORDER — EMPAGLIFLOZIN 10 MG PO TABS: 10.0000 mg | ORAL_TABLET | Freq: Every day | ORAL | 11 refills | Status: DC

## 2022-01-13 MED ORDER — SPIRONOLACTONE 25 MG PO TABS: 25.0000 mg | ORAL_TABLET | Freq: Every day | ORAL | 2 refills | Status: DC

## 2022-01-13 MED ORDER — POTASSIUM CHLORIDE CRYS ER 20 MEQ PO TBCR: 40.0000 meq | EXTENDED_RELEASE_TABLET | Freq: Every day | ORAL | 2 refills | Status: DC

## 2022-01-13 NOTE — Addendum Note (Signed)
Encounter addended by: Shonna Chock, CMA on: 81/11/4035 2:04 PM  Actions taken: Pharmacy for encounter modified, Order list changed

## 2022-01-13 NOTE — Patient Instructions (Signed)
Labs done today. We will contact you only if your labs are abnormal.  INCREASE Potassium to 39meq (2 tablets) by mouth daily.   INCREASE Spironolactone 25mg  (1 tablet) by mouth daily.   USE 1 FUROSCIX INJECTION FOR 6 DAYS, DO NOT TAKE LASIX DURING THIS TIME. RESTART LASIX WHEN YOU ARE DONE WITH FUROSCIX.   No other medication changes were made. Please continue all current medications as prescribed.  Your physician recommends that you schedule a follow-up appointment on Monday October 9th.   If you have any questions or concerns before your next appointment please send [place] a message through [place] or call our office at [phone].    TO LEAVE A MESSAGE FOR THE NURSE SELECT OPTION 2, PLEASE LEAVE A MESSAGE INCLUDING: YOUR NAME DATE OF BIRTH CALL BACK NUMBER REASON FOR CALL**this is important as we prioritize the call backs  YOU WILL RECEIVE A CALL BACK THE SAME DAY AS LONG AS YOU CALL BEFORE 4:00 PM   Do the following things EVERYDAY: Weigh yourself in the morning before breakfast. Write it down and keep it in a log. Take your medicines as prescribed Eat low salt foods--Limit salt (sodium) to 2000 mg per day.  Stay as active as you can everyday Limit all fluids for the day to less than 2 liters   At the [facility], you and your health needs are our priority. As part of our continuing mission to provide you with exceptional heart care, we have created designated Provider Care Teams. These Care Teams include your primary Cardiologist (physician) and Advanced Practice Providers (APPs- Physician Assistants and Nurse Practitioners) who all work together to provide you with the care you need, when you need it.   You may see any of the following providers on your designated Care Team at your next follow up: Dr Glori Bickers Dr Haynes Kerns, NP Lyda Jester, [place] Audry Riles, PharmD   Please be sure to bring in all your medications bottles to every  appointment.

## 2022-01-13 NOTE — Telephone Encounter (Signed)
Call attempted to confirm [facility] TOC appt 11AM today. HIPPA appropriate VM left with callback number.    Pricilla Holm, MSN, RN Heart Failure Nurse Navigator

## 2022-01-13 NOTE — Progress Notes (Signed)
HEART & VASCULAR TRANSITION OF CARE CONSULT NOTE     Referring Physician: Dr. Cruzita Lederer, Internal Medicine  Primary Care:Jobe, Alexis Goodell., MD Primary Cardiologist: Dr. Johnsie Cancel   HPI: Referred to clinic by Dr. Cruzita Lederer, internal medicine, for heart failure consultation.   Charlene Houston is a 76 y.o. female with a hx of breast cancer s/p right lumpectomy/chemoradiation 2007, metastatic NSCLC on current immunotherapy with Keytruda, type 2 diabetes, hypertension, hyperlipidemia, OSA on CPAP, PVD, GERD.  Recently admitted to [facility] 9/23 after presenting to ED w/ complaints of dyspnea. Found to be hypoxic w/ O2 sats in the 70s, required BiPAP. COVID and Flu negative. ABG with metabolic alkalosis pH 4.25, CO2 48, bicarb 35.  No leukocytosis or anemia. PCT 0.37. Mild AKI of 1.33 up from 0.95 and upward trending anion gap at 13. BNP was elevated 2425, troponin of 30>>29. CTA chest was negative for PE although segmental and subsegmental branches limited due to respiratory motion artifact.  Study did show new bilateral small pleural effusion and compressive atelectasis, multiple bilateral pulmonary nodules concerning for metastatic disease, similar to prior.  Acute hypoxic respiratory failure felt to be multifactorial due to worsening of her pneumonitis caused by [facility] along with new onset CHF. 2D Echo showed moderately reduced LVEF, 35-40%, GIDD, global HK, RV normal. Pulmonology was consulted and recommended 10 day course of prednisone and 7 day course of abx. She was treated w/ Doxycyline.  Keytruda discontinued.  Cardiology was consulted for CHF. She was diuresed w/ IV Lasix w/ improvement in volume status and resolution of AKI. Placed on GDMT, Jardiance, losartan and spironolactone. Transitioned to PO lasix. Etiology of CM uncertain. Unclear if possibly related to previous chemo. Also of note, EKG showed LBBB, QRS 153 ms. She was referred to [facility] clinic at discharge. D/c wt 209 lb. Discharged home  on 2L [place].   She presents to clinic today for f/u. In WC, on [place] 2L/min. Edematous on exam w/ 2-3+ b/l LEE pitting edema mid way up to thighs. She is wearing LE compression hoses. Wt up to 220 lb. She reports compliance w/ medications and good UOP w/ PO Lasix. BP 150/100 this morning, prior to AM meds.   She reports NYHA Class III symptoms. Denies chest pain. Reports compliance w/ home CPAP. Last set of labs 9/27 Scr 0.76, K 3.6.    Cardiac Testing   2D Echo 01/02/22 Left ventricular ejection fraction, by estimation, is 35 to 40%. The left ventricle has moderately decreased function. The left ventricle demonstrates global hypokinesis. The left ventricular internal cavity size was moderately dilated. Left ventricular diastolic parameters are consistent with Grade I diastolic dysfunction (impaired relaxation). 1. 2. Right ventricular systolic function is normal. The right ventricular size is normal. The mitral valve is abnormal. Trivial mitral valve regurgitation. No evidence of mitral stenosis. 3. Nodular calcifiecation of non coronary cusp. The aortic valve is tricuspid. There is moderate calcification of the aortic valve. Aortic valve regurgitation is not visualized. Aortic valve sclerosis is present, with no evidence of aortic valve stenosis. 4. The inferior vena cava is normal in size with greater than 50% respiratory variability, suggesting right atrial pressure of 3 mmHg.   Review of Systems: [y] = yes, [ ]  = no   General: Weight gain [Y ]; Weight loss [ ] ; Anorexia [ ] ; Fatigue [ ] ; Fever [ ] ; Chills [ ] ; Weakness [ ]   Cardiac: Chest pain/pressure [ ] ; Resting SOB [ ] ; Exertional SOB [ Y]; Orthopnea [ ] ;  Pedal Edema [Y ]; Palpitations [ ] ; Syncope [ ] ; Presyncope [ ] ; Paroxysmal nocturnal dyspnea[ ]   Pulmonary: Cough [ ] ; Wheezing[ ] ; Hemoptysis[ ] ; Sputum [ ] ; Snoring [ ]   GI: Vomiting[ ] ; Dysphagia[ ] ; Melena[ ] ; Hematochezia [ ] ; Heartburn[ ] ; Abdominal pain [ ] ; Constipation [  ]; Diarrhea [ ] ; BRBPR [ ]   GU: Hematuria[ ] ; Dysuria [ ] ; Nocturia[ ]   Vascular: Pain in legs with walking [ ] ; Pain in feet with lying flat [ ] ; Non-healing sores [ ] ; Stroke [ ] ; TIA [ ] ; Slurred speech [ ] ;  Neuro: Headaches[ ] ; Vertigo[ ] ; Seizures[ ] ; Paresthesias[ ] ;Blurred vision [ ] ; Diplopia [ ] ; Vision changes [ ]   Ortho/Skin: Arthritis [ ] ; Joint pain [ ] ; Muscle pain [ ] ; Joint swelling [ ] ; Back Pain [ ] ; Rash [ ]   Psych: Depression[ ] ; Anxiety[ ]   Heme: Bleeding problems [ ] ; Clotting disorders [ ] ; Anemia [ ]   Endocrine: Diabetes [ Y]; Thyroid dysfunction[ ]    Past Medical History:  Diagnosis Date   Arthritis    Asthma    Breast cancer ([place])    Diabetes mellitus without complication ([place])    Hypertension    SBO (small bowel obstruction) ([place]) 06/2018    Current Outpatient Medications  Medication Sig Dispense Refill   acetaminophen (TYLENOL) 325 MG tablet Take 650 mg by mouth every 6 (six) hours as needed for mild pain or headache.     albuterol (ACCUNEB) 1.25 MG/3ML nebulizer solution Take 3 mLs by nebulization every 6 (six) hours as needed for wheezing.     carboxymethylcellulose (REFRESH PLUS) 0.5 % SOLN Place 1 drop into both eyes daily as needed (dry eyes).     cyanocobalamin (,VITAMIN B-12,) 1000 MCG/ML injection Inject 1,000 mcg into the muscle every 30 (thirty) days.     desonide (DESOWEN) 0.05 % cream Apply topically 2 (two) times daily. (Patient taking differently: Apply 1 Application topically daily as needed (rash).) 30 g 1   empagliflozin (JARDIANCE) 10 MG TABS tablet Take 1 tablet (10 mg total) by mouth daily. 30 tablet 0   Ergocalciferol 10 MCG (400 UNIT) TABS Take 800-1,200 Units by mouth See admin instructions. Taking 2 tabs ( 800 units) on Sunday and 3 tabs (1200) units daily except Sunday.     escitalopram (LEXAPRO) 5 MG tablet Take 5 mg by mouth daily.     fluticasone (FLONASE) 50 MCG/ACT nasal spray Place 1 spray into both nostrils as needed for  allergies or rhinitis.      folic acid (FOLVITE) 1 MG tablet Take 1 mg by mouth daily.     Furosemide (FUROSCIX) 80 MG/10ML CTKT Inject 80 mg into the skin as directed. 6 each 0   furosemide (LASIX) 20 MG tablet Take 1 tablet (20 mg total) by mouth daily. 30 tablet 0   gabapentin (NEURONTIN) 300 MG capsule Take 300 mg by mouth 2 (two) times daily. Taking twice daily     glimepiride (AMARYL) 2 MG tablet Take 2 mg by mouth daily.     losartan (COZAAR) 25 MG tablet Take 1 tablet (25 mg total) by mouth daily. 30 tablet 0   mirtazapine (REMERON) 15 MG tablet Take 1 tablet (15 mg total) by mouth at bedtime. 90 tablet 3   montelukast (SINGULAIR) 10 MG tablet Take 50 mg by mouth daily.     predniSONE (DELTASONE) 20 MG tablet Take 2 tablets (40 mg total) by mouth daily with breakfast. 6 tablet 0   simvastatin (ZOCOR)  40 MG tablet Take 40 mg by mouth at bedtime.     triamcinolone cream (KENALOG) 0.5 % Apply 1 application topically 3 (three) times daily.     potassium chloride SA (KLOR-CON M) 20 MEQ tablet Take 2 tablets (40 mEq total) by mouth daily. 60 tablet 2   spironolactone (ALDACTONE) 25 MG tablet Take 1 tablet (25 mg total) by mouth daily. 30 tablet 2   No current facility-administered medications for this encounter.    Allergies  Allergen Reactions   Prozac [Fluoxetine Hcl] Other (See Comments)    Gittery   Vit D-Vit E-Safflower Oil Other (See Comments)    Not specified      Social History   Socioeconomic History   Marital status: Divorced    Spouse name: Not on file   Number of children: 2   Years of education: Not on file   Highest education level: High school graduate  Occupational History   Occupation: Retired  Tobacco Use   Smoking status: Former    Types: Cigarettes    Quit date: 1995    Years since quitting: 28.7   Smokeless tobacco: Never  Vaping Use   Vaping Use: Never used  Substance and Sexual Activity   Alcohol use: Not Currently   Drug use: Not Currently    Sexual activity: Not Currently  Other Topics Concern   Not on file  Social History Narrative   Not on file   Social Determinants of Health   Financial Resource Strain: Low Risk  (01/04/2022)   Overall Financial Resource Strain (CARDIA)    Difficulty of Paying Living Expenses: Not very hard  Food Insecurity: No Food Insecurity (01/04/2022)   Hunger Vital Sign    Worried About Running Out of Food in the Last Year: Never true    Ran Out of Food in the Last Year: Never true  Transportation Needs: No Transportation Needs (01/04/2022)   PRAPARE - Hydrologist (Medical): No    Lack of Transportation (Non-Medical): No  Physical Activity: Not on file  Stress: Not on file  Social Connections: Not on file  Intimate Partner Violence: Not on file      Family History  Problem Relation Age of Onset   Hypertension Mother    Diabetes Mother    Cancer Father     Vitals:   01/13/22 1106  BP: (!) 150/100  Pulse: 90  SpO2: 99%  Weight: 100 kg (220 lb 6.4 oz)    PHYSICAL EXAM: General:  elderly AAF in [place]. No respiratory difficulty HEENT: normal Neck: supple. Thick neck, JVD not well visualized. Carotids 2+ bilat; no bruits. No lymphadenopathy or thryomegaly appreciated. Cor: PMI nondisplaced. Regular rate & rhythm. No rubs, gallops or murmurs. Lungs: faint bibasilar crackles, no wheezing  Abdomen: soft, nontender, nondistended. No hepatosplenomegaly. No bruits or masses. Good bowel sounds. Extremities: no cyanosis, clubbing, rash, 2-3+ b/l LE edema mid way up thighs  Neuro: alert & oriented x 3, cranial nerves grossly intact. moves all 4 extremities w/o difficulty. Affect pleasant.  ECG: Not performed. Hospital EKG reviewed, LBBB QRS 153 ms    ASSESSMENT & PLAN:  1. 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  No baseline study for comparison - Etiology  uncertain. ? Chemo induced after breast cancer tx. Also ? LBBB CM. EKG w/ LBBB and QRSD 153 ms. Cannot r/o possibility of IHD,  recent chest CT + for coronary calcifications + multiple CRFs though no h/o ischemic like CP  - Will continue to titrate GDMT, plan repeat 2D echo in 3 months. If EF not improving, may consider LHC to r/o CAD and referral to EP for CRT device, depending on overall prognosis of lung CA. Will refer to Cardio-oncology clinic for further management - in the meantime, she needs more aggressive diuresis. NYHA class III - Furoscix 80 mg once daily x 5 days + 40 mEq of KCl daily  - Continue Jardiance 10 mg daily  - Increase Spironolactone to 25 mg daily - Continue Losartan 25 mg mg daily. Will try transition to [facility] next visit - no ? blocker yet w/ acute volume overload - f/u early next week in [facility] clinic to reassess volume status - f/u in Cardio-oncology clinic in 3 wks (assign to Dr. Cdebaca Nones)   2. Chronic Hypoxic Respiratory Failure - new home O2 requirements, 2L [place]  *(stable)  - multifactorial, ? Medication induced pneumonitis from Keytruda + lung CA + CHF - remains on prednisone taper per pulmonology  - diuresis per above   3. CAD - coronary calcifications on chest CT - denies anginal symptoms but may need LHC if EF dose not improve - continue statin   4. Metastatic NSCLC  - followed by oncology  - currently off Keytruda given concern for medication induced pneumonitis   5. H/o Breast Cancer - s/p right lumpectomy/chemoradiation 2007  6. Hypertension  - mildly elevated today, in setting of volume overload - increase spironolactone to 25 mg daily  - diuretic adjustment per above - check BMP today   7. Type 2 DM - controlled, Hgb A1c 6.0 - recently started Jardiance for HF, continue   8. OSA - reports compliance w/ CPAP   NYHA III GDMT  Diuretic- Furoscix 80 mg daily x 5 days  BB- not yet (volume overloaded)  Ace/ARB/ARNI Losartan 25 mg daily   MRA Spironolactone 25 mg daily  SGLT2 Jardiance 10 mg daily     Referred to HFSW (PCP, Medications, Transportation, ETOH Abuse, Drug Abuse, Insurance, Museum/gallery curator ): No  Refer to Pharmacy: No Refer to Home Health: No Refer to Advanced Heart Failure Clinic: Yes (assign to Dr. Lubrano Nones)  Refer to General Cardiology:  No  Follow up  next week in TOC to reassess volume status.

## 2022-01-18 ENCOUNTER — Telehealth (HOSPITAL_COMMUNITY): Payer: Self-pay

## 2022-01-18 ENCOUNTER — Ambulatory Visit (HOSPITAL_COMMUNITY): Payer: Medicare HMO

## 2022-01-18 NOTE — Telephone Encounter (Signed)
Call attempted to confirm HV TOC appt today at 3pm. HIPPA appropriate VM left with callback number.   Pricilla Holm, MSN, RN Heart Failure Nurse Navigator

## 2022-01-21 ENCOUNTER — Ambulatory Visit (HOSPITAL_COMMUNITY)
Admission: EM | Admit: 2022-01-21 | Discharge: 2022-01-21 | Disposition: A | Discharge status: Home / Self Care | Payer: Medicare HMO | Attending: Emergency Medicine | Admitting: Emergency Medicine

## 2022-01-21 ENCOUNTER — Ambulatory Visit (INDEPENDENT_AMBULATORY_CARE_PROVIDER_SITE_OTHER): Payer: Medicare HMO

## 2022-01-21 ENCOUNTER — Encounter (HOSPITAL_COMMUNITY): Payer: Self-pay | Admitting: Emergency Medicine

## 2022-01-21 DIAGNOSIS — M25531 Pain in right wrist: Secondary | ICD-10-CM | POA: Diagnosis not present

## 2022-01-21 DIAGNOSIS — S52591A Other fractures of lower end of right radius, initial encounter for closed fracture: Secondary | ICD-10-CM

## 2022-01-21 DIAGNOSIS — W19XXXA Unspecified fall, initial encounter: Secondary | ICD-10-CM

## 2022-01-21 DIAGNOSIS — M79641 Pain in right hand: Secondary | ICD-10-CM

## 2022-01-21 MED ORDER — KETOROLAC TROMETHAMINE 30 MG/ML IJ SOLN
30.0000 mg | Freq: Once | INTRAMUSCULAR | Status: AC
  Administered 2022-01-21: 30 mg via INTRAMUSCULAR

## 2022-01-21 MED ORDER — TRAMADOL HCL 50 MG PO TABS: 50.0000 mg | ORAL_TABLET | Freq: Four times a day (QID) | ORAL | 0 refills | Status: AC | PRN

## 2022-01-21 MED ORDER — KETOROLAC TROMETHAMINE 30 MG/ML IJ SOLN
INTRAMUSCULAR | Status: AC
  Filled 2022-01-21: qty 1

## 2022-01-21 NOTE — Progress Notes (Signed)
Orthopedic Tech Progress Note Patient Details:  Charlene Houston 12-28-1945 [id number]  Ortho Devices Type of Ortho Device: Sugartong splint, Arm sling Ortho Device/Splint Location: rUE Ortho Device/Splint Interventions: Ordered, Adjustment, Application   Post Interventions Patient Tolerated: Well Instructions Provided: Poper ambulation with device, Care of device  Charlene Houston Charlene Houston 01/21/2022, 4:09 PM

## 2022-01-21 NOTE — Discharge Instructions (Addendum)
I recommend taking tylenol or ibuprofen (with food) for the next few days to control pain.  If you have severe pain, you can take one of the Tramadol pills. This medication is a narcotic. It will make you drowsy - please use caution if you choose to take it.  Please call the orthopedic clinic to schedule an appointment for follow up.

## 2022-01-21 NOTE — ED Triage Notes (Signed)
Pt reports fell over when went to stand up yesterday landing on right side of body. C/o right hand pain that radiates to right wrist. Denies LOC or taking blood thinners.

## 2022-01-21 NOTE — ED Notes (Signed)
Ortho made aware of splint ordered.

## 2022-01-21 NOTE — ED Provider Notes (Signed)
[facility]    CSN: [id number] Arrival date & time: 01/21/22  1323      History   Chief Complaint Chief Complaint  Patient presents with   Fall    HPI Charlene Houston is a 76 y.o. female.  Presents with right hand and wrist injury [place] yesterday when trying to stand, landing on right hand Did not hit head. Not anticoagulated 10/10 pain that radiates from hand into wrist  Denies previous injury to this area  Tried tylenol this morning without relief. Applied some ice  History of breast cancer s/p chemo and radiation  Past Medical History:  Diagnosis Date   Arthritis    Asthma    Breast cancer ([place])    Diabetes mellitus without complication ([place])    Hypertension    SBO (small bowel obstruction) ([place]) 06/2018    Patient Active Problem List   Diagnosis Date Noted   Drug-induced pneumonitis    Acute combined systolic and diastolic congestive heart failure ([place]) 01/03/2022   Type 2 diabetes mellitus ([place]) 01/01/2022   AKI (acute kidney injury) ([place]) 01/01/2022   Dizziness 06/10/2021   Acute respiratory failure with hypoxia ([place]) 06/10/2021   Hypokalemia 06/10/2021   Hypomagnesemia 06/10/2021   GERD (gastroesophageal reflux disease) 06/10/2021   Non-small cell lung cancer ([place]) 06/10/2021   COVID-19 virus infection 04/23/2020   Hypoglycemia 04/22/2020   Rhabdomyolysis 04/22/2020   Essential hypertension 04/22/2020   HLD (hyperlipidemia) 04/22/2020   Sleep apnea 04/22/2020   Diabetes mellitus type 2 in nonobese ([place]) 04/22/2020   SBO (small bowel obstruction) ([place]) 07/06/2018    Past Surgical History:  Procedure Laterality Date   ABDOMINAL HYSTERECTOMY     THUMB AMPUTATION Left    PARTIAL   TONSILLECTOMY      OB History   No obstetric history on file.      Home Medications    Prior to Admission medications   Medication Sig Start Date End Date Taking? Authorizing Provider  traMADol (ULTRAM) 50 MG tablet Take 1 tablet (50 mg  total) by mouth every 6 (six) hours as needed for up to 3 days for severe pain. 01/21/22 01/24/22 Yes Shi Grose, Wells Guiles, PA-C  acetaminophen (TYLENOL) 325 MG tablet Take 650 mg by mouth every 6 (six) hours as needed for mild pain or headache.    [provider]  albuterol (ACCUNEB) 1.25 MG/3ML nebulizer solution Take 3 mLs by nebulization every 6 (six) hours as needed for wheezing. 01/16/21   [provider]  carboxymethylcellulose (REFRESH PLUS) 0.5 % SOLN Place 1 drop into both eyes daily as needed (dry eyes).    [provider]  cyanocobalamin (,VITAMIN B-12,) 1000 MCG/ML injection Inject 1,000 mcg into the muscle every 30 (thirty) days.    [provider]  desonide (DESOWEN) 0.05 % cream Apply topically 2 (two) times daily. Patient taking differently: Apply 1 Application topically daily as needed (rash). 09/03/20   Raspet, Derry Skill, PA-C  empagliflozin (JARDIANCE) 10 MG TABS tablet Take 1 tablet (10 mg total) by mouth daily. 01/13/22   Consuelo Pandy, PA-C  Ergocalciferol 10 MCG (400 UNIT) TABS Take 800-1,200 Units by mouth See admin instructions. Taking 2 tabs ( 800 units) on Sunday and 3 tabs (1200) units daily except Sunday. 12/23/20   [provider]  escitalopram (LEXAPRO) 5 MG tablet Take 5 mg by mouth daily.    [provider]  fluticasone (FLONASE) 50 MCG/ACT nasal spray Place 1 spray into both nostrils as needed for  allergies or rhinitis.     [provider]  folic acid (FOLVITE) 1 MG tablet Take 1 mg by mouth daily. 11/02/21   [provider]  Furosemide (FUROSCIX) 80 MG/10ML CTKT Inject 80 mg into the skin as directed. 01/13/22   Lyda Jester M, PA-C  furosemide (LASIX) 20 MG tablet Take 1 tablet (20 mg total) by mouth daily. 07/16/21 01/13/22  Chase Picket, MD  gabapentin (NEURONTIN) 300 MG capsule Take 300 mg by mouth 2 (two) times daily. Taking twice daily 10/26/17   [provider]  glimepiride  (AMARYL) 2 MG tablet Take 2 mg by mouth daily. 09/05/21   [provider]  losartan (COZAAR) 25 MG tablet Take 1 tablet (25 mg total) by mouth daily. 07/16/21   Lamptey, Myrene Galas, MD  montelukast (SINGULAIR) 10 MG tablet Take 50 mg by mouth daily. 04/28/21   [provider]  potassium chloride SA (KLOR-CON M) 20 MEQ tablet Take 2 tablets (40 mEq total) by mouth daily. 01/13/22   Lyda Jester M, PA-C  predniSONE (DELTASONE) 20 MG tablet Take 2 tablets (40 mg total) by mouth daily with breakfast. 01/07/22   Caren Griffins, MD  spironolactone (ALDACTONE) 25 MG tablet Take 1 tablet (25 mg total) by mouth daily. 01/13/22   Consuelo Pandy, PA-C    Family History Family History  Problem Relation Age of Onset   Hypertension Mother    Diabetes Mother    Cancer Father     Social History Social History   Tobacco Use   Smoking status: Former    Types: Cigarettes    Quit date: 1995    Years since quitting: 28.7   Smokeless tobacco: Never  Vaping Use   Vaping Use: Never used  Substance Use Topics   Alcohol use: Not Currently   Drug use: Not Currently     Allergies   Prozac [fluoxetine hcl] and Vit d-vit e-safflower oil   Review of Systems Review of Systems Per HPI  Physical Exam Triage Vital Signs ED Triage Vitals  Enc Vitals Group     BP 01/21/22 1431 106/63     Pulse Rate 01/21/22 1431 79     Resp 01/21/22 1431 20     Temp 01/21/22 1431 98.9 F (37.2 C)     Temp Source 01/21/22 1431 Oral     SpO2 01/21/22 1431 99 %     Weight --      Height --      Head Circumference --      Peak Flow --      Pain Score 01/21/22 1430 10     Pain Loc --      Pain Edu? --      Excl. in [place]? --    No data found.  Updated Vital Signs BP 106/63 (BP Location: Left Arm)   Pulse 79   Temp 98.9 F (37.2 C) (Oral)   Resp 20   SpO2 99%    Physical Exam Vitals and nursing note reviewed.  Cardiovascular:     Rate and Rhythm: Normal rate and regular rhythm.      Pulses: Normal pulses.     Heart sounds: Normal heart sounds.  Pulmonary:     Effort: Pulmonary effort is normal.     Breath sounds: Normal breath sounds.     Comments: On 2L O2 at baseline Musculoskeletal:        General: Tenderness present.     Comments: Right dorsal wrist tender  to palpation. Radiates into wrist. No snuff box tenderness. No obvious deformity, mild swelling. Cap refill < 2 seconds. ROM limited at wrist due to pain  Skin:    General: Skin is warm and dry.     Capillary Refill: Capillary refill takes less than 2 seconds.  Neurological:     Mental Status: She is alert and oriented to person, place, and time.     Comments: Radial pulse is strong. Distal sensation intact.       UC Treatments / Results  Labs (all labs ordered are listed, but only abnormal results are displayed) Labs Reviewed - No data to display  EKG  Radiology DG Hand Complete Right  Result Date: 01/21/2022 CLINICAL DATA:  fall, wrist and hand pain EXAM: RIGHT HAND - COMPLETE 3+ VIEW COMPARISON:  None Available. FINDINGS: Acute transverse fracture of the distal radial metadiaphysis with slight apex volar angulation. No joint malalignment. Surrounding soft tissue swelling. IMPRESSION: Acute transverse fracture of the distal radial metadiaphysis with slight apex volar angulation Electronically Signed   By: Margaretha Sheffield M.D.   On: 01/21/2022 14:47    Procedures Procedures (including critical care time)  Medications Ordered in UC Medications  ketorolac (TORADOL) 30 MG/ML injection 30 mg (30 mg Intramuscular Given 01/21/22 1508)    Initial Impression / Assessment and Plan / UC Course  I have reviewed the triage vital signs and the nursing notes.  Pertinent labs & imaging results that were available during my care of the patient were reviewed by me and considered in my medical decision making (see chart for details).  Tylenol this morning didn't help pain, ibuprofen causes upset  stomach. Toradol 30 mg IM given for pain in clinic.  Right hand xray with acute fracture distal radius, slight angulation. Sugar tong splint applied.  Discussed use of ibuprofen after eating, alternated with tylenol for pain. Sent tramadol to use for breakthrough pain, 3 day supply. PDMP reviewed. 50 mg q6 hours PRN, discussed side effects and misuse. Patient daughter lives close to her and can monitor if needed.  Provided orthopedic information, patient will call to schedule follow up. Return precautions discussed. Patient agrees to plan  Final Clinical Impressions(s) / UC Diagnoses   Final diagnoses:  Other closed fracture of distal end of right radius, initial encounter  Right wrist pain     Discharge Instructions      I recommend taking tylenol or ibuprofen (with food) for the next few days to control pain.  If you have severe pain, you can take one of the Tramadol pills. This medication is a narcotic. It will make you drowsy - please use caution if you choose to take it.  Please call the orthopedic clinic to schedule an appointment for follow up.     ED Prescriptions     Medication Sig Dispense Auth. Provider   traMADol (ULTRAM) 50 MG tablet Take 1 tablet (50 mg total) by mouth every 6 (six) hours as needed for up to 3 days for severe pain. 12 tablet Anay Walter, Wells Guiles, PA-C      I have reviewed the PDMP during this encounter.   Clemence Stillings, Wells Guiles, [place] 01/21/22 1606

## 2022-02-03 NOTE — Progress Notes (Signed)
ADVANCED HEART FAILURE CLINIC NOTE  Referring Physician: Lilian Coma., MD  Primary Care: Charlene Coma., MD Primary Cardiologist:  HPI: Charlene Houston is a 76 y.o. female with breast cancer hx of breast cancer s/p right lumpectomy/chemoradiation 2007, metastatic NSCLC on immunotherapy with Keytruda, type 2 diabetes, hypertension, hyperlipidemia, OSA on CPAP, PVD, GERD. She was admitted to [facility] in 9/23 with hypoxic respiratory failure that was felt to be secondary to pneumonitis from [facility] and new onset systolic heart failure (LVEF 35%-40%). Charlene Houston was discontinued; Charlene Houston was diuresed, started on GDMT and discharged. She has since been to [facility] clinic where she was noted to have significant pitting edema. She was started on Furoscix for 5 days.    Activity level/exercise tolerance:  *** Orthopnea:  Sleeps on *** pillows Paroxysmal noctural dyspnea:  *** Chest pain/pressure:  *** Orthostatic lightheadedness:  *** Palpitations:  *** Lower extremity edema:  *** Presyncope/syncope:  *** Cough:  ***  Past Medical History:  Diagnosis Date   Arthritis    Asthma    Breast cancer ([place])    Diabetes mellitus without complication ([place])    Hypertension    SBO (small bowel obstruction) ([place]) 06/2018    Current Outpatient Medications  Medication Sig Dispense Refill   acetaminophen (TYLENOL) 325 MG tablet Take 650 mg by mouth every 6 (six) hours as needed for mild pain or headache.     albuterol (ACCUNEB) 1.25 MG/3ML nebulizer solution Take 3 mLs by nebulization every 6 (six) hours as needed for wheezing.     carboxymethylcellulose (REFRESH PLUS) 0.5 % SOLN Place 1 drop into both eyes daily as needed (dry eyes).     cyanocobalamin (,VITAMIN B-12,) 1000 MCG/ML injection Inject 1,000 mcg into the muscle every 30 (thirty) days.     desonide (DESOWEN) 0.05 % cream Apply topically 2 (two) times daily. (Patient taking differently: Apply 1 Application topically daily as needed  (rash).) 30 g 1   empagliflozin (JARDIANCE) 10 MG TABS tablet Take 1 tablet (10 mg total) by mouth daily. 30 tablet 11   Ergocalciferol 10 MCG (400 UNIT) TABS Take 800-1,200 Units by mouth See admin instructions. Taking 2 tabs ( 800 units) on Sunday and 3 tabs (1200) units daily except Sunday.     escitalopram (LEXAPRO) 5 MG tablet Take 5 mg by mouth daily.     fluticasone (FLONASE) 50 MCG/ACT nasal spray Place 1 spray into both nostrils as needed for allergies or rhinitis.      folic acid (FOLVITE) 1 MG tablet Take 1 mg by mouth daily.     Furosemide (FUROSCIX) 80 MG/10ML CTKT Inject 80 mg into the skin as directed. 6 each 0   furosemide (LASIX) 20 MG tablet Take 1 tablet (20 mg total) by mouth daily. 30 tablet 0   gabapentin (NEURONTIN) 300 MG capsule Take 300 mg by mouth 2 (two) times daily. Taking twice daily     glimepiride (AMARYL) 2 MG tablet Take 2 mg by mouth daily.     losartan (COZAAR) 25 MG tablet Take 1 tablet (25 mg total) by mouth daily. 30 tablet 0   montelukast (SINGULAIR) 10 MG tablet Take 50 mg by mouth daily.     potassium chloride SA (KLOR-CON M) 20 MEQ tablet Take 2 tablets (40 mEq total) by mouth daily. 60 tablet 2   predniSONE (DELTASONE) 20 MG tablet Take 2 tablets (40 mg total) by mouth daily with breakfast. 6 tablet 0   spironolactone (ALDACTONE) 25 MG tablet Take  1 tablet (25 mg total) by mouth daily. 30 tablet 2   No current facility-administered medications for this visit.    Allergies  Allergen Reactions   Prozac [Fluoxetine Hcl] Other (See Comments)    Gittery   Vit D-Vit E-Safflower Oil Other (See Comments)    Not specified      Social History   Socioeconomic History   Marital status: Divorced    Spouse name: Not on file   Number of children: 2   Years of education: Not on file   Highest education level: High school graduate  Occupational History   Occupation: Retired  Tobacco Use   Smoking status: Former    Types: Cigarettes    Quit date:  1995    Years since quitting: 28.8   Smokeless tobacco: Never  Vaping Use   Vaping Use: Never used  Substance and Sexual Activity   Alcohol use: Not Currently   Drug use: Not Currently   Sexual activity: Not Currently  Other Topics Concern   Not on file  Social History Narrative   Not on file   Social Determinants of Health   Financial Resource Strain: Low Risk  (01/04/2022)   Overall Financial Resource Strain (CARDIA)    Difficulty of Paying Living Expenses: Not very hard  Food Insecurity: No Food Insecurity (01/04/2022)   Hunger Vital Sign    Worried About Running Out of Food in the Last Year: Never true    Ran Out of Food in the Last Year: Never true  Transportation Needs: No Transportation Needs (01/04/2022)   PRAPARE - Hydrologist (Medical): No    Lack of Transportation (Non-Medical): No  Physical Activity: Not on file  Stress: Not on file  Social Connections: Not on file  Intimate Partner Violence: Not on file      Family History  Problem Relation Age of Onset   Hypertension Mother    Diabetes Mother    Cancer Father     PHYSICAL EXAM: There were no vitals filed for this visit. GENERAL: Well nourished, well developed, and in no apparent distress at rest.  HEENT: Negative for arcus senilis or xanthelasma. There is no scleral icterus.  The mucous membranes are pink and moist.   NECK: Supple, No masses. Normal carotid upstrokes without bruits. No masses or thyromegaly.    CHEST: There are no chest wall deformities. There is no chest wall tenderness. Respirations are unlabored.  Lungs- *** CARDIAC:  JVP: *** cm H2O         {HEART SOUNDS:22645}  Normal rate with regular rhythm. No murmurs, rubs or gallops.  Pulses are 2+ and symmetrical in upper and lower extremities. *** edema.  ABDOMEN: Soft, non-tender, non-distended. There are no masses or hepatomegaly. There are normal bowel sounds.  EXTREMITIES: Warm and well perfused with no  cyanosis, clubbing.  LYMPHATIC: No axillary or supraclavicular lymphadenopathy.  NEUROLOGIC: Patient is oriented x3 with no focal or lateralizing neurologic deficits.  PSYCH: Patients affect is appropriate, there is no evidence of anxiety or depression.  SKIN: Warm and dry; no lesions or wounds.   DATA REVIEW  ECG:***  ECHO:***  CATH:***   ASSESSMENT & PLAN:  *** Etiology of HF:*** NYHA class / AHA Stage:*** Volume status & Diuretics: *** Vasodilators:*** Beta-Blocker:*** MRA:*** Cardiometabolic:*** Devices therapies & Valvulopathies:*** Advanced therapies:***  Follow-up:  No follow-ups on file.   Charlene Houston Advanced Heart Failure Mechanical Circulatory Support

## 2022-02-04 ENCOUNTER — Encounter (HOSPITAL_COMMUNITY): Payer: Self-pay | Admitting: Cardiology

## 2022-02-04 ENCOUNTER — Ambulatory Visit (HOSPITAL_COMMUNITY)
Admission: RE | Admit: 2022-02-04 | Discharge: 2022-02-04 | Disposition: A | Discharge status: Home / Self Care | Payer: Medicare HMO | Source: Ambulatory Visit | Attending: Cardiology | Admitting: Cardiology

## 2022-02-04 VITALS — BP 100/60 | HR 83 | Wt 208.0 lb

## 2022-02-04 DIAGNOSIS — C3432 Malignant neoplasm of lower lobe, left bronchus or lung: Secondary | ICD-10-CM | POA: Diagnosis not present

## 2022-02-04 DIAGNOSIS — I5042 Chronic combined systolic (congestive) and diastolic (congestive) heart failure: Secondary | ICD-10-CM | POA: Diagnosis not present

## 2022-02-04 DIAGNOSIS — E1151 Type 2 diabetes mellitus with diabetic peripheral angiopathy without gangrene: Secondary | ICD-10-CM | POA: Insufficient documentation

## 2022-02-04 DIAGNOSIS — Z79899 Other long term (current) drug therapy: Secondary | ICD-10-CM | POA: Insufficient documentation

## 2022-02-04 DIAGNOSIS — C3490 Malignant neoplasm of unspecified part of unspecified bronchus or lung: Secondary | ICD-10-CM | POA: Diagnosis not present

## 2022-02-04 DIAGNOSIS — Z7952 Long term (current) use of systemic steroids: Secondary | ICD-10-CM | POA: Insufficient documentation

## 2022-02-04 DIAGNOSIS — J984 Other disorders of lung: Secondary | ICD-10-CM | POA: Insufficient documentation

## 2022-02-04 DIAGNOSIS — E785 Hyperlipidemia, unspecified: Secondary | ICD-10-CM | POA: Insufficient documentation

## 2022-02-04 DIAGNOSIS — K219 Gastro-esophageal reflux disease without esophagitis: Secondary | ICD-10-CM | POA: Insufficient documentation

## 2022-02-04 DIAGNOSIS — I5020 Unspecified systolic (congestive) heart failure: Secondary | ICD-10-CM | POA: Insufficient documentation

## 2022-02-04 DIAGNOSIS — Z923 Personal history of irradiation: Secondary | ICD-10-CM | POA: Diagnosis not present

## 2022-02-04 DIAGNOSIS — Z853 Personal history of malignant neoplasm of breast: Secondary | ICD-10-CM | POA: Diagnosis not present

## 2022-02-04 DIAGNOSIS — Z170 Estrogen receptor positive status [ER+]: Secondary | ICD-10-CM | POA: Diagnosis not present

## 2022-02-04 DIAGNOSIS — I959 Hypotension, unspecified: Secondary | ICD-10-CM | POA: Insufficient documentation

## 2022-02-04 DIAGNOSIS — C50911 Malignant neoplasm of unspecified site of right female breast: Secondary | ICD-10-CM | POA: Diagnosis not present

## 2022-02-04 DIAGNOSIS — E669 Obesity, unspecified: Secondary | ICD-10-CM | POA: Diagnosis not present

## 2022-02-04 DIAGNOSIS — I110 Hypertensive heart disease with heart failure: Secondary | ICD-10-CM | POA: Diagnosis present

## 2022-02-04 DIAGNOSIS — G4733 Obstructive sleep apnea (adult) (pediatric): Secondary | ICD-10-CM | POA: Insufficient documentation

## 2022-02-04 DIAGNOSIS — I408 Other acute myocarditis: Secondary | ICD-10-CM

## 2022-02-04 LAB — COMPREHENSIVE METABOLIC PANEL
ALT: 29 U/L (ref 0–44)
AST: 23 U/L (ref 15–41)
Albumin: 3.2 g/dL — ABNORMAL LOW (ref 3.5–5.0)
Alkaline Phosphatase: 58 U/L (ref 38–126)
Anion gap: 10 (ref 5–15)
BUN: 10 mg/dL (ref 8–23)
CO2: 36 mmol/L — ABNORMAL HIGH (ref 22–32)
Calcium: 9.4 mg/dL (ref 8.9–10.3)
Chloride: 92 mmol/L — ABNORMAL LOW (ref 98–111)
Creatinine, Ser: 0.92 mg/dL (ref 0.44–1.00)
GFR, Estimated: >60 mL/min (ref >60)
Glucose, Bld: 114 mg/dL — ABNORMAL HIGH (ref 70–99)
Potassium: 4.5 mmol/L (ref 3.5–5.1)
Sodium: 138 mmol/L (ref 135–145)
Total Bilirubin: 0.8 mg/dL (ref 0.3–1.2)
Total Protein: 6.2 g/dL — ABNORMAL LOW (ref 6.5–8.1)

## 2022-02-04 LAB — BRAIN NATRIURETIC PEPTIDE: B Natriuretic Peptide: 958 pg/mL — ABNORMAL HIGH (ref 0.0–100.0)

## 2022-02-04 MED ORDER — FUROSEMIDE 40 MG PO TABS: 40.0000 mg | ORAL_TABLET | Freq: Two times a day (BID) | ORAL | 6 refills | Status: DC

## 2022-02-04 NOTE — Patient Instructions (Signed)
Medication Changes:  Increase Furosemide to 40 mg Twice daily   Lab Work:  Labs done today, we will call you for abnormal results  Testing/Procedures:  Your physician has requested that you have a cardiac MRI. Cardiac MRI uses a computer to create images of your heart as its beating, producing both still and moving pictures of your heart and major blood vessels. For further information please visit [email or web address] Please follow the instruction sheet given to you today for more information. YOU WILL BE CALLED TO SCHEDULE THIS  Referrals:  none  Special Instructions // Education:  Do the following things EVERYDAY: Weigh yourself in the morning before breakfast. Write it down and keep it in a log. Take your medicines as prescribed Eat low salt foods--Limit salt (sodium) to 2000 mg per day.  Stay as active as you can everyday Limit all fluids for the day to less than 2 liters   Follow-Up:  Please follow up with our heart failure pharmacist in 3-4 weeks  Your physician recommends that you schedule a follow-up appointment in: 6-8 weeks    At the [facility], you and your health needs are our priority. We have a designated team specialized in the treatment of Heart Failure. This Care Team includes your primary Heart Failure Specialized Cardiologist (physician), Advanced Practice Providers (APPs- Physician Assistants and Nurse Practitioners), and Pharmacist who all work together to provide you with the care you need, when you need it.   You may see any of the following providers on your designated Care Team at your next follow up:  Dr. Glori Bickers Dr. Loralie Champagne Dr. Roxana Hires, NP Lyda Jester, [place] [facility] [place], [place] Forestine Na, NP Audry Riles, PharmD   Please be sure to bring in all your medications bottles to every appointment.   Need to Contact us:  If you have any questions or concerns before your next  appointment please send [place] a message through [place] or call our office at [phone].    TO LEAVE A MESSAGE FOR THE NURSE SELECT OPTION 2, PLEASE LEAVE A MESSAGE INCLUDING: YOUR NAME DATE OF BIRTH CALL BACK NUMBER REASON FOR CALL**this is important as we prioritize the call backs  YOU WILL RECEIVE A CALL BACK THE SAME DAY AS LONG AS YOU CALL BEFORE 4:00 PM

## 2022-02-20 ENCOUNTER — Emergency Department (HOSPITAL_COMMUNITY): Payer: Medicare HMO

## 2022-02-20 ENCOUNTER — Other Ambulatory Visit (HOSPITAL_COMMUNITY): Payer: Medicare HMO

## 2022-02-20 ENCOUNTER — Other Ambulatory Visit: Payer: Self-pay

## 2022-02-20 ENCOUNTER — Emergency Department (HOSPITAL_COMMUNITY)
Admission: EM | Admit: 2022-02-20 | Discharge: 2022-02-21 | Disposition: A | Discharge status: Home / Self Care | Payer: Medicare HMO | Attending: Emergency Medicine | Admitting: Emergency Medicine

## 2022-02-20 DIAGNOSIS — I509 Heart failure, unspecified: Secondary | ICD-10-CM | POA: Diagnosis not present

## 2022-02-20 DIAGNOSIS — Z79899 Other long term (current) drug therapy: Secondary | ICD-10-CM | POA: Diagnosis not present

## 2022-02-20 DIAGNOSIS — Z7984 Long term (current) use of oral hypoglycemic drugs: Secondary | ICD-10-CM | POA: Diagnosis not present

## 2022-02-20 DIAGNOSIS — R5383 Other fatigue: Secondary | ICD-10-CM | POA: Diagnosis present

## 2022-02-20 DIAGNOSIS — R2243 Localized swelling, mass and lump, lower limb, bilateral: Secondary | ICD-10-CM | POA: Insufficient documentation

## 2022-02-20 DIAGNOSIS — Z85118 Personal history of other malignant neoplasm of bronchus and lung: Secondary | ICD-10-CM | POA: Diagnosis not present

## 2022-02-20 DIAGNOSIS — R739 Hyperglycemia, unspecified: Secondary | ICD-10-CM | POA: Insufficient documentation

## 2022-02-20 LAB — I-STAT CHEM 8, ED
BUN: 21 mg/dL (ref 8–23)
BUN: 9 mg/dL (ref 8–23)
Calcium, Ion: 0.7 mmol/L — CL (ref 1.15–1.40)
Calcium, Ion: 1.05 mmol/L — ABNORMAL LOW (ref 1.15–1.40)
Chloride: 108 mmol/L (ref 98–111)
Chloride: 88 mmol/L — ABNORMAL LOW (ref 98–111)
Creatinine, Ser: 0.5 mg/dL (ref 0.44–1.00)
Creatinine, Ser: 1.2 mg/dL — ABNORMAL HIGH (ref 0.44–1.00)
Glucose, Bld: 182 mg/dL — ABNORMAL HIGH (ref 70–99)
Glucose, Bld: 482 mg/dL — ABNORMAL HIGH (ref 70–99)
HCT: 25 % — ABNORMAL LOW (ref 36.0–46.0)
HCT: 35 % — ABNORMAL LOW (ref 36.0–46.0)
Hemoglobin: 11.9 g/dL — ABNORMAL LOW (ref 12.0–15.0)
Hemoglobin: 8.5 g/dL — ABNORMAL LOW (ref 12.0–15.0)
Potassium: 2.7 mmol/L — CL (ref 3.5–5.1)
Potassium: 5.8 mmol/L — ABNORMAL HIGH (ref 3.5–5.1)
Sodium: 127 mmol/L — ABNORMAL LOW (ref 135–145)
Sodium: 141 mmol/L (ref 135–145)
TCO2: 21 mmol/L — ABNORMAL LOW (ref 22–32)
TCO2: 37 mmol/L — ABNORMAL HIGH (ref 22–32)

## 2022-02-20 LAB — CBG MONITORING, ED
Glucose-Capillary: 443 mg/dL — ABNORMAL HIGH (ref 70–99)
Glucose-Capillary: 266 mg/dL — ABNORMAL HIGH (ref 70–99)

## 2022-02-20 LAB — CBC WITH DIFFERENTIAL/PLATELET
Abs Immature Granulocytes: 0.17 10*3/uL — ABNORMAL HIGH (ref 0.00–0.07)
Basophils Absolute: 0 10*3/uL (ref 0.0–0.1)
Basophils Relative: 0 %
Eosinophils Absolute: 0 10*3/uL (ref 0.0–0.5)
Eosinophils Relative: 0 %
HCT: 34.4 % — ABNORMAL LOW (ref 36.0–46.0)
Hemoglobin: 11.1 g/dL — ABNORMAL LOW (ref 12.0–15.0)
Immature Granulocytes: 1 %
Lymphocytes Relative: 5 %
Lymphs Abs: 0.7 10*3/uL (ref 0.7–4.0)
MCH: 32.1 pg (ref 26.0–34.0)
MCHC: 32.3 g/dL (ref 30.0–36.0)
MCV: 99.4 fL (ref 80.0–100.0)
Monocytes Absolute: 0.5 10*3/uL (ref 0.1–1.0)
Monocytes Relative: 3 %
Neutro Abs: 12.9 10*3/uL — ABNORMAL HIGH (ref 1.7–7.7)
Neutrophils Relative %: 91 %
Platelets: 205 10*3/uL (ref 150–400)
RBC: 3.46 MIL/uL — ABNORMAL LOW (ref 3.87–5.11)
RDW: 12.8 % (ref 11.5–15.5)
WBC: 14.3 10*3/uL — ABNORMAL HIGH (ref 4.0–10.5)
nRBC: 0 % (ref 0.0–0.2)

## 2022-02-20 LAB — URINALYSIS, ROUTINE W REFLEX MICROSCOPIC
Bilirubin Urine: NEGATIVE
Glucose, UA: >=500 mg/dL — AB
Hgb urine dipstick: NEGATIVE
Ketones, ur: NEGATIVE mg/dL
Nitrite: NEGATIVE
Protein, ur: NEGATIVE mg/dL
Specific Gravity, Urine: 1.021 (ref 1.005–1.030)
pH: 7 (ref 5.0–8.0)

## 2022-02-20 LAB — COMPREHENSIVE METABOLIC PANEL
ALT: 31 U/L (ref 0–44)
AST: 30 U/L (ref 15–41)
Albumin: 2.9 g/dL — ABNORMAL LOW (ref 3.5–5.0)
Alkaline Phosphatase: 72 U/L (ref 38–126)
Anion gap: 13 (ref 5–15)
BUN: 17 mg/dL (ref 8–23)
CO2: 31 mmol/L (ref 22–32)
Calcium: 8.7 mg/dL — ABNORMAL LOW (ref 8.9–10.3)
Chloride: 86 mmol/L — ABNORMAL LOW (ref 98–111)
Creatinine, Ser: 1.22 mg/dL — ABNORMAL HIGH (ref 0.44–1.00)
GFR, Estimated: 46 mL/min — ABNORMAL LOW (ref >60)
Glucose, Bld: 459 mg/dL — ABNORMAL HIGH (ref 70–99)
Potassium: 5.4 mmol/L — ABNORMAL HIGH (ref 3.5–5.1)
Sodium: 130 mmol/L — ABNORMAL LOW (ref 135–145)
Total Bilirubin: 0.9 mg/dL (ref 0.3–1.2)
Total Protein: 5.9 g/dL — ABNORMAL LOW (ref 6.5–8.1)

## 2022-02-20 MED ORDER — SODIUM CHLORIDE 0.9 % IV BOLUS
1000.0000 mL | Freq: Once | INTRAVENOUS | Status: AC
  Administered 2022-02-20: 1000 mL via INTRAVENOUS

## 2022-02-20 MED ORDER — INSULIN ASPART 100 UNIT/ML IJ SOLN
10.0000 [IU] | Freq: Once | INTRAMUSCULAR | Status: AC
  Administered 2022-02-20: 10 [IU] via SUBCUTANEOUS

## 2022-02-20 NOTE — ED Triage Notes (Signed)
Patient BIB by EMS from home. Patient was facetiming family and was told face was swollen. CBG 525, c/o vision changes, feeling different. 2 liters via [place] at home, 4 liters while ambulating at home. VSS. 20g LFA

## 2022-02-20 NOTE — Discharge Instructions (Addendum)
1) see your doctor to have you potassium rechecked this week  Get help right away if: Your blood glucose monitor reads "high" even when you are taking insulin. You have trouble breathing. You have a change in how you think, feel, or act (mental status). You have nausea or vomiting that does not go away.

## 2022-02-20 NOTE — ED Provider Notes (Signed)
[facility] EMERGENCY DEPARTMENT Provider Note   CSN: [id number] Arrival date & time: 02/20/22  1908     History {Add pertinent medical, surgical, social history, OB history to HPI:1} Chief Complaint  Patient presents with   Hyperglycemia    Charlene Houston is a 76 y.o. female who presents emergency department for hyperglycemia.  Patient reports that she was face timing family members when they felt like her face appeared swollen.  Patient states that she looked in her face and did not necessarily feel like it was swollen however her daughter called 911.  Upon arrival EMS found that her blood sugar was 525.  She states normally it runs between 102 100 with her oral medications.  She states she has been more fatigued lately and has had increased urinary output but denies burning, urgency, foul odor or hematuria.  She denies abdominal pain or flank pain.  She does not particularly feel that her face is swollen.  She has a past medical history of CHF and lung cancer and is no longer on Keytruda due to her heart failure.  She denies cough, fevers or chills.  He has chronic hypoxic respiratory failure and is on her baseline oxygen.   Hyperglycemia      Home Medications Prior to Admission medications   Medication Sig Start Date End Date Taking? Authorizing Provider  acetaminophen (TYLENOL) 325 MG tablet Take 650 mg by mouth every 6 (six) hours as needed for mild pain or headache.    [provider]  albuterol (ACCUNEB) 1.25 MG/3ML nebulizer solution Take 3 mLs by nebulization every 6 (six) hours as needed for wheezing. 01/16/21   [provider]  carboxymethylcellulose (REFRESH PLUS) 0.5 % SOLN Place 1 drop into both eyes daily as needed (dry eyes).    [provider]  cyanocobalamin (,VITAMIN B-12,) 1000 MCG/ML injection Inject 1,000 mcg into the muscle every 30 (thirty) days.    [provider]  desonide (DESOWEN) 0.05 % cream  Apply 1 Application topically as needed.    [provider]  empagliflozin (JARDIANCE) 10 MG TABS tablet Take 1 tablet (10 mg total) by mouth daily. 01/13/22   Consuelo Pandy, PA-C  Ergocalciferol 10 MCG (400 UNIT) TABS Take 800-1,200 Units by mouth See admin instructions. Taking 2 tabs ( 800 units) on Sunday and 3 tabs (1200) units daily except Sunday. 12/23/20   [provider]  escitalopram (LEXAPRO) 5 MG tablet Take 5 mg by mouth daily.    [provider]  fluticasone (FLONASE) 50 MCG/ACT nasal spray Place 1 spray into both nostrils as needed for allergies or rhinitis.     [provider]  folic acid (FOLVITE) 1 MG tablet Take 1 mg by mouth daily. 11/02/21   [provider]  Furosemide (FUROSCIX) 80 MG/10ML CTKT Inject 80 mg into the skin as directed. Patient not taking: Reported on 02/04/2022 01/13/22   Lyda Jester M, PA-C  furosemide (LASIX) 40 MG tablet Take 1 tablet (40 mg total) by mouth 2 (two) times daily. 02/04/22   Sabharwal, Aditya, DO  gabapentin (NEURONTIN) 300 MG capsule Take 300 mg by mouth 2 (two) times daily. Taking twice daily 10/26/17   [provider]  glimepiride (AMARYL) 2 MG tablet Take 2 mg by mouth daily. 09/05/21   [provider]  losartan (COZAAR) 25 MG tablet Take 1 tablet (25 mg total) by mouth daily. 07/16/21   Chase Picket, MD  montelukast (SINGULAIR) 10 MG tablet Take  10 mg by mouth daily in the afternoon.    [provider]  potassium chloride SA (KLOR-CON M) 20 MEQ tablet Take 2 tablets (40 mEq total) by mouth daily. 01/13/22   Lyda Jester M, PA-C  predniSONE (DELTASONE) 20 MG tablet Take 30 mg by mouth daily with breakfast. 1 1/2 tablet    [provider]  spironolactone (ALDACTONE) 25 MG tablet Take 1 tablet (25 mg total) by mouth daily. 01/13/22   Consuelo Pandy, PA-C      Allergies    Prozac [fluoxetine hcl] and Vit d-vit e-safflower oil    Review of  Systems   Review of Systems  Physical Exam Updated Vital Signs BP 109/62   Pulse 92   Temp 98.4 F (36.9 C) (Oral)   Resp 20   SpO2 100%  Physical Exam Vitals and nursing note reviewed.  Constitutional:      General: She is not in acute distress.    Appearance: She is well-developed. She is not diaphoretic.     Interventions: Nasal cannula in place.  HENT:     Head: Normocephalic and atraumatic.     Right Ear: External ear normal.     Left Ear: External ear normal.     Nose: Nose normal.     Mouth/Throat:     Mouth: Mucous membranes are moist.  Eyes:     General: No scleral icterus.    Conjunctiva/sclera: Conjunctivae normal.  Cardiovascular:     Rate and Rhythm: Normal rate and regular rhythm.     Heart sounds: Normal heart sounds. No murmur heard.    No friction rub. No gallop.  Pulmonary:     Effort: Pulmonary effort is normal. No respiratory distress.     Breath sounds: Normal breath sounds.  Abdominal:     General: Bowel sounds are normal. There is no distension.     Palpations: Abdomen is soft. There is no mass.     Tenderness: There is no abdominal tenderness. There is no guarding.  Musculoskeletal:     Cervical back: Normal range of motion.     Right lower leg: Edema present.     Left lower leg: Edema present.  Skin:    General: Skin is warm and dry.  Neurological:     Mental Status: She is alert and oriented to person, place, and time.  Psychiatric:        Behavior: Behavior normal.     ED Results / Procedures / Treatments   Labs (all labs ordered are listed, but only abnormal results are displayed) Labs Reviewed  COMPREHENSIVE METABOLIC PANEL  CBC WITH DIFFERENTIAL/PLATELET  URINALYSIS, ROUTINE W REFLEX MICROSCOPIC  CBG MONITORING, ED  I-STAT CHEM 8, ED    EKG None  Radiology No results found.  Procedures Procedures  {Document cardiac monitor, telemetry assessment procedure when appropriate:1}  Medications Ordered in ED Medications -  No data to display  ED Course/ Medical Decision Making/ A&P Clinical Course as of 02/20/22 2102  Sat Feb 20, 2022  2058 Sodium(!): 130 [AH]  2059 Potassium(!): 5.4 [AH]  2101 Potassium(!): 5.4 [AH]  2101 Sodium(!): 130 [AH]  2101 Glucose(!): 459 [AH]  2101 Creatinine(!): 1.22 [AH]    Clinical Course User Index [AH] Margarita Mail, PA-C                           Medical Decision Making Amount and/or Complexity of Data Reviewed Labs: ordered. Decision-making details documented in  ED Course. Radiology: ordered.   ***  {Document critical care time when appropriate:1} {Document review of labs and clinical decision tools ie heart score, Chads2Vasc2 etc:1}  {Document your independent review of radiology images, and any outside records:1} {Document your discussion with family members, caretakers, and with consultants:1} {Document social determinants of health affecting pt's care:1} {Document your decision making why or why not admission, treatments were needed:1} Final Clinical Impression(s) / ED Diagnoses Final diagnoses:  None    Rx / DC Orders ED Discharge Orders     None

## 2022-02-21 NOTE — ED Notes (Signed)
Ptar called 

## 2022-02-21 NOTE — ED Notes (Signed)
PTAR cancelled due to family providing transportation.

## 2022-02-28 NOTE — Progress Notes (Incomplete)
***In Progress***    Advanced Heart Failure Clinic Note   HPI:  Charlene Houston is a 76 y.o. 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 Charlene Houston history dates back to 2007 when she was diagnosed with metastatic breast cancer. At that time she reports having a lumpectomy with removal of >12 lymph nodes followed by 6-7 months of chemotherapy and radiation ending in 2008. During this time she was noted to have small possibly metastatic lung nodules that were too small for biopsy (as per patient). In April 2022, she underwent biopsy of a left lower lobe nodule; subsequently diagnosed with stage IV ([id number]) NSCLC, adenocarcinoma. CT CAP and bone scan following that revealed multiple bilateral nodules. She was started on Keytruda on 10/30/20.    Presented to AHF clinic on 02/04/2022 to see Dr. Milner Nones. According to Charlene Houston, she tolerated Beryle Flock very well for the first several months of therapy and reported good appetite; improvement in mobility and exercise capacity. Unfortunately, for several weeks prior to admission at [facility], she started to feel increasingly short of breath with worsening lower extremity edema, orthopnea and PND. She was admitted to [facility] in 9/23 with hypoxic respiratory failure that was felt to be secondary to pneumonitis from [facility] and new onset systolic heart failure (LVEF 35%-40%). Beryle Flock was discontinued; Charlene Houston was diuresed, started on GDMT and discharged. She has since been to [facility] clinic where she was noted to have significant pitting edema. She was started on Furoscix for 5 days.   She reported significant diuresis with Furoscix, however, continues to have fairly poor exercise capacity with intermittent orthopnea and persistent LE edema. Her cough has improved  significantly with continued prednisone. She is still on 2L [place].   Today she returns to HF clinic for pharmacist medication titration. At last visit with Dr. Fines Nones empagliflozin 10 mg daily was initiated.   Overall feeling ***. Dizziness, lightheadedness, fatigue:  Chest pain or palpitations:  How is your breathing?: *** SOB: Able to complete all ADLs. Activity level ***  Weight at home pounds. Takes furosemide/torsemide/bumex *** mg *** daily.  LEE PND/Orthopnea  Appetite *** Low-salt diet:   Physical Exam Cost/affordability of meds   Shortness of breath/dyspnea on exertion? {YES NO:22349}  Orthopnea/PND? {YES NO:22349} Edema? {YES NO:22349} Lightheadedness/dizziness? {YES NO:22349} Daily weights at home? {YES NO:22349} Blood pressure/heart rate monitoring at home? {YES [id number] Following low-sodium/fluid-restricted diet? {YES NO:22349}  HF Medications: Losartan 25 mg daily  Spironolactone 25 mg daily  Empagliflozin 10 mg daily  Furosemide 40 mg BID  Has the patient been experiencing any side effects to the medications prescribed?  {YES NO:22349}  Does the patient have any problems obtaining medications due to transportation or finances?   {YES NO:22349}  Understanding of regimen: {excellent/good/fair/poor:19665} Understanding of indications: {excellent/good/fair/poor:19665} Potential of compliance: {excellent/good/fair/poor:19665} Patient understands to avoid NSAIDs. Patient understands to avoid decongestants.    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 Will obtain CMR to assess  degree of LGE with plan for q3-94mechocardiograms to closely monitor function  while she transitions to a different chemotherapy regimen. At this time would continue prednisone until cardiac MRI for management of myocarditis and as we slowly start GDMT. NYHA class / AHA Stage:III Volume status & Diuretics: hypervolemic, increase lasix to 7m twice daily for the next week. Repeat labs in 1 week.  Vasodilators:losartan 252mdaily; low BP today; [facility].  Beta-Blocker:Significantly volume overloaded today; start toprol 12.78m73m78mg once daily v coreg 3.278m66mnding BP at follow up.  MRA:TKP:TWSFKCLEronolactone 278mg76mly Cardiometabolic: started on jardiance 10mg 81mices therapies & Valvulopathies:not indicated currently Advanced therapies:not indicated currently; plan for repeat cardiac MR as noted above.    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 hypoxic respiratory failure during recent admit; on prednisone 30mg d61m managed by pulmonology and oncology.  - on [place] 2L  Follow up with Dr. SabharwDaniel Nones15/2023.  Lauren Audry RilesD, BCPS, BCCP, CPP Heart Failure Clinic Pharmacist [phone]

## 2022-03-01 ENCOUNTER — Telehealth (HOSPITAL_COMMUNITY): Payer: Self-pay | Admitting: Cardiology

## 2022-03-01 ENCOUNTER — Ambulatory Visit (HOSPITAL_COMMUNITY)
Admission: RE | Admit: 2022-03-01 | Discharge: 2022-03-01 | Disposition: A | Discharge status: Home / Self Care | Payer: Medicare HMO | Source: Ambulatory Visit | Attending: Internal Medicine | Admitting: Internal Medicine

## 2022-03-01 VITALS — BP 114/72 | HR 91 | Wt 201.4 lb

## 2022-03-01 DIAGNOSIS — E1165 Type 2 diabetes mellitus with hyperglycemia: Secondary | ICD-10-CM | POA: Insufficient documentation

## 2022-03-01 DIAGNOSIS — I5020 Unspecified systolic (congestive) heart failure: Secondary | ICD-10-CM | POA: Diagnosis present

## 2022-03-01 DIAGNOSIS — J984 Other disorders of lung: Secondary | ICD-10-CM | POA: Insufficient documentation

## 2022-03-01 DIAGNOSIS — I10 Essential (primary) hypertension: Secondary | ICD-10-CM | POA: Diagnosis not present

## 2022-03-01 DIAGNOSIS — J9691 Respiratory failure, unspecified with hypoxia: Secondary | ICD-10-CM | POA: Insufficient documentation

## 2022-03-01 DIAGNOSIS — I5042 Chronic combined systolic (congestive) and diastolic (congestive) heart failure: Secondary | ICD-10-CM

## 2022-03-01 DIAGNOSIS — C50919 Malignant neoplasm of unspecified site of unspecified female breast: Secondary | ICD-10-CM | POA: Insufficient documentation

## 2022-03-01 DIAGNOSIS — I514 Myocarditis, unspecified: Secondary | ICD-10-CM | POA: Diagnosis not present

## 2022-03-01 DIAGNOSIS — G4733 Obstructive sleep apnea (adult) (pediatric): Secondary | ICD-10-CM | POA: Diagnosis not present

## 2022-03-01 MED ORDER — CARVEDILOL 3.125 MG PO TABS: 3.1250 mg | ORAL_TABLET | Freq: Two times a day (BID) | ORAL | 3 refills | Status: DC

## 2022-03-01 MED ORDER — POTASSIUM CHLORIDE CRYS ER 20 MEQ PO TBCR: 20.0000 meq | EXTENDED_RELEASE_TABLET | Freq: Every day | ORAL | 3 refills | Status: DC

## 2022-03-01 NOTE — Telephone Encounter (Signed)
CALLED PATIENT TO CONFIRM FUROSCIX RECIVED AFTER PA APPROVED   PA VALID THRU 04/12/2023 HUB ID [id number]  [facility]

## 2022-03-01 NOTE — Progress Notes (Signed)
Advanced Heart Failure Clinic Note   Referring Physician: Lilian Coma., MD  Primary Care: Lilian Coma., MD Primary Cardiologist: Dr. Shankland Nones  HPI:  Charlene [place] is a 76 y.o. 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 Charlene Houston history dates back to 2007 when she was diagnosed with metastatic breast cancer. At that time she reports having a lumpectomy with removal of >12 lymph nodes followed by 6-7 months of chemotherapy and radiation ending in 2008. During this time she was noted to have small possibly metastatic lung nodules that were too small for biopsy (as per patient). In 07/2020, she underwent biopsy of a left lower lobe nodule; subsequently diagnosed with stage IV ([id number]) NSCLC, adenocarcinoma. CT CAP and bone scan following that revealed multiple bilateral nodules. She was started on Keytruda on 10/30/2020.    Presented to AHF clinic on 02/04/2022 to see Dr. Gayden Nones. According to Charlene Houston, she tolerated Beryle Flock very well for the first several months of therapy and reported good appetite and improvement in mobility/exercise capacity. Unfortunately, for several weeks prior to eventual admission at [facility], she started to feel increasingly short of breath with worsening lower extremity edema, orthopnea and PND. She was admitted to [facility] in 12/2021 with hypoxic respiratory failure that was felt to be secondary to pneumonitis from [facility] and new onset systolic heart failure (LVEF 35%-40%). Beryle Flock was discontinued. Charlene [place] was diuresed, started on GDMT and discharged. She has since been to [facility] clinic where she was noted to have significant pitting edema. She was started on Furoscix for 5 days. She reported significant diuresis with Furoscix, however, continued to have  fairly poor exercise capacity with intermittent orthopnea and persistent LE edema. Her cough had improved significantly with continued prednisone. She is on 2L [place].   Presented to ED on 02/20/2022 for hyperglycemia (BG 525 on arrival) likely attributed to prednisone use. Potassium was elevated (up to 5.8). She was given insulin and IV fluids and was discharged the next day.  Today she returns to HF clinic for pharmacist medication titration. At last visit with Dr. Lipkin Nones, furosemide was increased to 40 mg BID. According to PCP note from [facility] in [place], losartan was discontinued due to low BP. However, she states she is still taking losartan 25 mg daily. Overall, Charlene Houston is feeling "OK" and improved from her last visit. Specifically, she feels less fatigued and has more energy. She has been able to make her own meals, get her own drinks, and take care of her own hygiene. No dizziness or lightheadedness. No chest pain or palpitations. She says her breathing has improved since her last visit (still on 2L [place]). She has noticed she is coughing much less than usual. While she is generally sedentary, she does not feel limited by SOB in the activities that she does perform day-to-day. Her weight at home is typically stable at ~200 lbs. She takes furosemide 40 mg BID. Endorses significant improvement of LEE. Still has trace bilateral LEE, R>L on exam today. She had been scheduled for a Doppler ultrasound of her RLE to rule out DVT, but missed this appointment and says she will call to reschedule. No orthopnea/PND. She describes her appetite as "very good" and is on prednisone and mirtazapine. She says she craves salt occasionally, but her daughter has been sending her low-sodium pre-packaged meals  which she enjoys. She does not have trouble accessing her medications.  HF Medications: Losartan 25 mg daily  Spironolactone 25 mg daily  Empagliflozin 10 mg daily  Furosemide 40 mg BID  Understanding of  regimen: good Understanding of indications: good Potential of compliance: good Patient understands to avoid NSAIDs. Patient understands to avoid decongestants.    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 Will obtain CMR to assess degree of LGE with plan for q3-63mechocardiograms to closely monitor function while she transitions to a different chemotherapy regimen. At this time would continue prednisone until cardiac MRI for management of myocarditis and as we slowly start GDMT. NYHA class / AHA Stage: III Volume status & Diuretics: Volume status much improved today. Weight down 7 lbs from last visit. Continue furosemide 40 mg BID. K was 4.9 on labs from 02/26/22, decrease potassium chloride to 20 mEq daily. Vasodilators: Continue losartan 25 mg daily; [facility]  Beta-Blocker: Discussed case with Dr. SDaniel Nones volume status has improved significantly since last visit. Start carvedilol 3.125 mg BID.  MRA: Continue spironolactone 25 mg daily Cardiometabolic: Continue empagliflozin 10 mg daily. Recent ED visit for hyperglycemia (BG >500) and A1C 9.4% 02/2022, likely due to prednisone. BG managed by PCP. Will continue empagliflozin for now with low threshold to discontinue if BG remains uncontrolled.   Devices therapies & Valvulopathies: Not indicated currently Advanced therapies: Not indicated currently; plan for repeat cardiac MR as noted above.    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 [place]   Pneumonitis - Hypoxic respiratory failure during recent admit; on prednisone 20 mg daily managed by pulmonology and oncology.  - on [place] 2L  Follow up with Dr. SDaniel Noneson 03/26/2022.  LAudry Riles PharmD, BCPS, BCCP, CPP Heart Failure Clinic Pharmacist [phone]

## 2022-03-01 NOTE — Patient Instructions (Addendum)
It was a pleasure seeing you today!  MEDICATIONS: -We are changing your medications today -Decrease potassium supplement to 20 mEq (1 tablet) daily -Start carvedilol 3.125 mg (1 tablet) twice daily -Call if you have questions about your medications.   NEXT APPOINTMENT: Return to clinic in 3 week with Dr. Kuehl Nones.  In general, to take care of your heart failure: -Limit your fluid intake to 2 Liters (half-gallon) per day.   -Limit your salt intake to ideally 2-3 grams (2000-3000 mg) per day. -Weigh yourself daily and record, and bring that "weight diary" to your next appointment.  (Weight gain of 2-3 pounds in 1 day typically means fluid weight.) -The medications for your heart are to help your heart and help you live longer.   -Please contact us before stopping any of your heart medications.  Call the clinic at [phone] with questions or to reschedule future appointments.

## 2022-03-10 ENCOUNTER — Emergency Department (HOSPITAL_COMMUNITY)
Admission: EM | Admit: 2022-03-10 | Discharge: 2022-03-10 | Disposition: A | Discharge status: Home / Self Care | Payer: Medicare HMO | Attending: Emergency Medicine | Admitting: Emergency Medicine

## 2022-03-10 ENCOUNTER — Other Ambulatory Visit: Payer: Self-pay

## 2022-03-10 ENCOUNTER — Encounter (HOSPITAL_COMMUNITY): Payer: Self-pay

## 2022-03-10 DIAGNOSIS — Z7951 Long term (current) use of inhaled steroids: Secondary | ICD-10-CM | POA: Diagnosis not present

## 2022-03-10 DIAGNOSIS — K625 Hemorrhage of anus and rectum: Secondary | ICD-10-CM | POA: Insufficient documentation

## 2022-03-10 DIAGNOSIS — I10 Essential (primary) hypertension: Secondary | ICD-10-CM | POA: Insufficient documentation

## 2022-03-10 DIAGNOSIS — K649 Unspecified hemorrhoids: Secondary | ICD-10-CM | POA: Diagnosis present

## 2022-03-10 DIAGNOSIS — K644 Residual hemorrhoidal skin tags: Secondary | ICD-10-CM | POA: Diagnosis not present

## 2022-03-10 DIAGNOSIS — Z79899 Other long term (current) drug therapy: Secondary | ICD-10-CM | POA: Insufficient documentation

## 2022-03-10 DIAGNOSIS — J45909 Unspecified asthma, uncomplicated: Secondary | ICD-10-CM | POA: Insufficient documentation

## 2022-03-10 DIAGNOSIS — E119 Type 2 diabetes mellitus without complications: Secondary | ICD-10-CM | POA: Insufficient documentation

## 2022-03-10 DIAGNOSIS — Z7984 Long term (current) use of oral hypoglycemic drugs: Secondary | ICD-10-CM | POA: Insufficient documentation

## 2022-03-10 DIAGNOSIS — Z853 Personal history of malignant neoplasm of breast: Secondary | ICD-10-CM | POA: Diagnosis not present

## 2022-03-10 LAB — CBC WITH DIFFERENTIAL/PLATELET
Abs Immature Granulocytes: 0.19 10*3/uL — ABNORMAL HIGH (ref 0.00–0.07)
Basophils Absolute: 0 10*3/uL (ref 0.0–0.1)
Basophils Relative: 0 %
Eosinophils Absolute: 0.1 10*3/uL (ref 0.0–0.5)
Eosinophils Relative: 0 %
HCT: 34.2 % — ABNORMAL LOW (ref 36.0–46.0)
Hemoglobin: 10.9 g/dL — ABNORMAL LOW (ref 12.0–15.0)
Immature Granulocytes: 1 %
Lymphocytes Relative: 20 %
Lymphs Abs: 2.8 10*3/uL (ref 0.7–4.0)
MCH: 32.7 pg (ref 26.0–34.0)
MCHC: 31.9 g/dL (ref 30.0–36.0)
MCV: 102.7 fL — ABNORMAL HIGH (ref 80.0–100.0)
Monocytes Absolute: 0.7 10*3/uL (ref 0.1–1.0)
Monocytes Relative: 5 %
Neutro Abs: 10.4 10*3/uL — ABNORMAL HIGH (ref 1.7–7.7)
Neutrophils Relative %: 74 %
Platelets: 219 10*3/uL (ref 150–400)
RBC: 3.33 MIL/uL — ABNORMAL LOW (ref 3.87–5.11)
RDW: 13.1 % (ref 11.5–15.5)
WBC: 14.2 10*3/uL — ABNORMAL HIGH (ref 4.0–10.5)
nRBC: 0.3 % — ABNORMAL HIGH (ref 0.0–0.2)

## 2022-03-10 LAB — COMPREHENSIVE METABOLIC PANEL
ALT: 26 U/L (ref 0–44)
AST: 22 U/L (ref 15–41)
Albumin: 2.8 g/dL — ABNORMAL LOW (ref 3.5–5.0)
Alkaline Phosphatase: 60 U/L (ref 38–126)
Anion gap: 15 (ref 5–15)
BUN: 13 mg/dL (ref 8–23)
CO2: 38 mmol/L — ABNORMAL HIGH (ref 22–32)
Calcium: 9.1 mg/dL (ref 8.9–10.3)
Chloride: 87 mmol/L — ABNORMAL LOW (ref 98–111)
Creatinine, Ser: 0.97 mg/dL (ref 0.44–1.00)
GFR, Estimated: >60 mL/min (ref >60)
Glucose, Bld: 196 mg/dL — ABNORMAL HIGH (ref 70–99)
Potassium: 4.2 mmol/L (ref 3.5–5.1)
Sodium: 140 mmol/L (ref 135–145)
Total Bilirubin: 0.7 mg/dL (ref 0.3–1.2)
Total Protein: 5.6 g/dL — ABNORMAL LOW (ref 6.5–8.1)

## 2022-03-10 LAB — TYPE AND SCREEN
ABO/RH(D): O POS
Antibody Screen: NEGATIVE

## 2022-03-10 LAB — ABO/RH: ABO/RH(D): O POS

## 2022-03-10 LAB — POC OCCULT BLOOD, ED: Fecal Occult Bld: NEGATIVE

## 2022-03-10 MED ORDER — WITCH HAZEL-GLYCERIN EX PADS: 1.0000 | MEDICATED_PAD | CUTANEOUS | 0 refills | Status: DC | PRN

## 2022-03-10 NOTE — ED Provider Notes (Signed)
[facility] EMERGENCY DEPARTMENT Provider Note   CSN: [id number] Arrival date & time: 03/10/22  1945     History  Chief Complaint  Patient presents with   Hemorrhoids   [place] is a 76 y.o. female.  HPI Patient presents with some rectal pain and GI bleeding.  History hemorrhoids.  Reportedly has been having blood in stool from the last week.  With wiping and some red in the vault also.  Pain in this area.  No nausea or vomiting and no real abdominal pain.  States she does feel lightheaded at times.   Past Medical History:  Diagnosis Date   Arthritis    Asthma    Breast cancer ([place])    Diabetes mellitus without complication ([place])    Hypertension    SBO (small bowel obstruction) ([place]) 06/2018    Home Medications Prior to Admission medications   Medication Sig Start Date End Date Taking? Authorizing Provider  witch hazel-glycerin (TUCKS) pad Apply 1 Application topically as needed for itching. 03/10/22  Yes Davonna Belling, MD  acetaminophen (TYLENOL) 325 MG tablet Take 650 mg by mouth every 6 (six) hours as needed for mild pain or headache.    [provider]  albuterol (ACCUNEB) 1.25 MG/3ML nebulizer solution Take 3 mLs by nebulization every 6 (six) hours as needed for wheezing. 01/16/21   [provider]  carboxymethylcellulose (REFRESH PLUS) 0.5 % SOLN Place 1 drop into both eyes daily as needed (dry eyes).    [provider]  carvedilol (COREG) 3.125 MG tablet Take 1 tablet (3.125 mg total) by mouth 2 (two) times daily. 03/01/22   Sabharwal, Aditya, DO  cyanocobalamin (,VITAMIN B-12,) 1000 MCG/ML injection Inject 1,000 mcg into the muscle every 30 (thirty) days.    [provider]  desonide (DESOWEN) 0.05 % cream Apply 1 Application topically as needed.    [provider]  empagliflozin (JARDIANCE) 10 MG TABS tablet Take 1 tablet (10 mg total) by mouth daily. 01/13/22    Consuelo Pandy, PA-C  Ergocalciferol 10 MCG (400 UNIT) TABS Take 800-1,200 Units by mouth See admin instructions. Taking 2 tabs ( 800 units) on Sunday and 3 tabs (1200) units daily except Sunday. 12/23/20   [provider]  escitalopram (LEXAPRO) 5 MG tablet Take 5 mg by mouth daily.    [provider]  fluticasone (FLONASE) 50 MCG/ACT nasal spray Place 1 spray into both nostrils as needed for allergies or rhinitis.     [provider]  folic acid (FOLVITE) 1 MG tablet Take 1 mg by mouth daily. 11/02/21   [provider]  Furosemide (FUROSCIX) 80 MG/10ML CTKT Inject 80 mg into the skin as directed. Patient not taking: Reported on 02/04/2022 01/13/22   Lyda Jester M, PA-C  furosemide (LASIX) 40 MG tablet Take 1 tablet (40 mg total) by mouth 2 (two) times daily. 02/04/22   Sabharwal, Aditya, DO  gabapentin (NEURONTIN) 300 MG capsule Take 300 mg by mouth 2 (two) times daily. Taking twice daily 10/26/17   [provider]  glimepiride (AMARYL) 2 MG tablet Take 2 mg by mouth daily. 09/05/21   [provider]  linagliptin (TRADJENTA) 5 MG TABS tablet Take 5 mg by mouth daily. 02/26/22   [provider]  losartan (COZAAR) 25 MG tablet Take 1 tablet (25 mg total) by mouth daily. 07/16/21   Chase Picket, MD  mirtazapine (REMERON) 15 MG tablet Take 15 mg  by mouth at bedtime.    [provider]  montelukast (SINGULAIR) 10 MG tablet Take 10 mg by mouth daily in the afternoon.    [provider]  potassium chloride SA (KLOR-CON M) 20 MEQ tablet Take 1 tablet (20 mEq total) by mouth daily. 03/01/22   Sabharwal, Aditya, DO  predniSONE (DELTASONE) 20 MG tablet Take 20 mg by mouth daily with breakfast.    [provider]  simvastatin (ZOCOR) 40 MG tablet Take 40 mg by mouth daily.    [provider]  spironolactone (ALDACTONE) 25 MG tablet Take 1 tablet (25 mg total) by mouth daily. 01/13/22   Consuelo Pandy, PA-C      Allergies    Prozac [fluoxetine hcl] and Vit d-vit e-safflower oil    Review of Systems   Review of Systems  Physical Exam Updated Vital Signs BP 112/65   Pulse 82   Temp 98.2 F (36.8 C) (Oral)   Resp 18   Ht 5\' 5"  (1.651 m)   Wt 91.6 kg   SpO2 100%   BMI 33.61 kg/m  Physical Exam Vitals and nursing note reviewed.  Cardiovascular:     Rate and Rhythm: Regular rhythm.  Pulmonary:     Breath sounds: No wheezing or rhonchi.  Abdominal:     Tenderness: There is no abdominal tenderness.  Genitourinary:    Rectum: Guaiac result negative.     Comments: Large hemorrhoid.  Some blood on the hemorrhoid.  Tenderness over the hemorrhoid. Skin:    General: Skin is warm.     Capillary Refill: Capillary refill takes less than 2 seconds.  Neurological:     Mental Status: She is alert and oriented to person, place, and time.     ED Results / Procedures / Treatments   Labs (all labs ordered are listed, but only abnormal results are displayed) Labs Reviewed  COMPREHENSIVE METABOLIC PANEL - Abnormal; Notable for the following components:      Result Value   Chloride 87 (*)    CO2 38 (*)    Glucose, Bld 196 (*)    Total Protein 5.6 (*)    Albumin 2.8 (*)    All other components within normal limits  CBC WITH DIFFERENTIAL/PLATELET - Abnormal; Notable for the following components:   WBC 14.2 (*)    RBC 3.33 (*)    Hemoglobin 10.9 (*)    HCT 34.2 (*)    MCV 102.7 (*)    nRBC 0.3 (*)    Neutro Abs 10.4 (*)    Abs Immature Granulocytes 0.19 (*)    All other components within normal limits  POC OCCULT BLOOD, ED  TYPE AND SCREEN  ABO/RH    EKG EKG Interpretation  Date/Time:  Wednesday March 10 2022 20:01:24 EST Ventricular Rate:  94 PR Interval:  132 QRS Duration: 162 QT Interval:  390 QTC Calculation: 488 R Axis:   -43 Text Interpretation: Sinus rhythm Left bundle branch block No significant change since last tracing Confirmed by Davonna Belling [phone]) on 03/10/2022 9:12:15 PM  Radiology No results found.  Procedures Procedures    Medications Ordered in ED Medications - No data to display  ED Course/ Medical Decision Making/ A&P                           Medical Decision Making Amount and/or Complexity of Data Reviewed Labs: ordered.  Risk OTC drugs.   Patient presented for rectal  bleeding.  Also rectal pain.  Pain with bowel movements.  Exam does show a thrombosed hemorrhoid that is tender and has had some bleeding.  I think this is likely the source of blood.  Rectal exam done and really had no pain besides the hemorrhoid.  No blood on the finger and guaiac negative.  Doubt lower GI bleed besides a hemorrhoid.  Hemoglobin reassuring.  Appears stable follow-up with gastroenterology.        Final Clinical Impression(s) / ED Diagnoses Final diagnoses:  External hemorrhoid  Bleeding hemorrhoid    Rx / DC Orders ED Discharge Orders          Ordered    witch hazel-glycerin (TUCKS) pad  As needed        03/10/22 2236              Davonna Belling, MD 03/10/22 2336

## 2022-03-10 NOTE — ED Notes (Signed)
Pt's son went home to get pt's home O2 tank. Pt waiting in [place] on hospital O2 tank until son returns. Pt's son will drive pt home after.

## 2022-03-10 NOTE — ED Triage Notes (Signed)
Pt BIB EMS from home. EMS called out for GI bleed. Per EMS pt has hx of hemorrhoids - pt reports seeing blood in stool for the last week.  VSS with EMS  BP 118/62 HR 96 CBG 170 Pt AO and ambulatory - pain worse with mvmt

## 2022-03-26 ENCOUNTER — Ambulatory Visit (HOSPITAL_COMMUNITY)
Admission: RE | Admit: 2022-03-26 | Discharge: 2022-03-26 | Disposition: A | Discharge status: Home / Self Care | Payer: Medicare HMO | Source: Ambulatory Visit | Attending: Cardiology | Admitting: Cardiology

## 2022-03-26 ENCOUNTER — Encounter (HOSPITAL_COMMUNITY): Payer: Self-pay | Admitting: Cardiology

## 2022-03-26 ENCOUNTER — Telehealth (HOSPITAL_COMMUNITY): Payer: Self-pay

## 2022-03-26 VITALS — BP 100/60 | HR 97 | Wt 202.0 lb

## 2022-03-26 DIAGNOSIS — J984 Other disorders of lung: Secondary | ICD-10-CM | POA: Diagnosis not present

## 2022-03-26 DIAGNOSIS — C50911 Malignant neoplasm of unspecified site of right female breast: Secondary | ICD-10-CM | POA: Diagnosis not present

## 2022-03-26 DIAGNOSIS — I428 Other cardiomyopathies: Secondary | ICD-10-CM | POA: Diagnosis not present

## 2022-03-26 DIAGNOSIS — I5022 Chronic systolic (congestive) heart failure: Secondary | ICD-10-CM | POA: Insufficient documentation

## 2022-03-26 DIAGNOSIS — E1151 Type 2 diabetes mellitus with diabetic peripheral angiopathy without gangrene: Secondary | ICD-10-CM | POA: Insufficient documentation

## 2022-03-26 DIAGNOSIS — Z7984 Long term (current) use of oral hypoglycemic drugs: Secondary | ICD-10-CM | POA: Diagnosis not present

## 2022-03-26 DIAGNOSIS — Z853 Personal history of malignant neoplasm of breast: Secondary | ICD-10-CM | POA: Diagnosis not present

## 2022-03-26 DIAGNOSIS — Z7952 Long term (current) use of systemic steroids: Secondary | ICD-10-CM | POA: Diagnosis not present

## 2022-03-26 DIAGNOSIS — E785 Hyperlipidemia, unspecified: Secondary | ICD-10-CM | POA: Insufficient documentation

## 2022-03-26 DIAGNOSIS — G4733 Obstructive sleep apnea (adult) (pediatric): Secondary | ICD-10-CM | POA: Insufficient documentation

## 2022-03-26 DIAGNOSIS — J9691 Respiratory failure, unspecified with hypoxia: Secondary | ICD-10-CM | POA: Diagnosis not present

## 2022-03-26 DIAGNOSIS — C3490 Malignant neoplasm of unspecified part of unspecified bronchus or lung: Secondary | ICD-10-CM | POA: Diagnosis not present

## 2022-03-26 DIAGNOSIS — Z923 Personal history of irradiation: Secondary | ICD-10-CM | POA: Insufficient documentation

## 2022-03-26 DIAGNOSIS — Z79899 Other long term (current) drug therapy: Secondary | ICD-10-CM | POA: Insufficient documentation

## 2022-03-26 DIAGNOSIS — I5042 Chronic combined systolic (congestive) and diastolic (congestive) heart failure: Secondary | ICD-10-CM

## 2022-03-26 DIAGNOSIS — K219 Gastro-esophageal reflux disease without esophagitis: Secondary | ICD-10-CM | POA: Diagnosis not present

## 2022-03-26 DIAGNOSIS — C3432 Malignant neoplasm of lower lobe, left bronchus or lung: Secondary | ICD-10-CM | POA: Diagnosis not present

## 2022-03-26 DIAGNOSIS — I110 Hypertensive heart disease with heart failure: Secondary | ICD-10-CM | POA: Diagnosis not present

## 2022-03-26 LAB — BASIC METABOLIC PANEL
Anion gap: 9 (ref 5–15)
BUN: 16 mg/dL (ref 8–23)
CO2: 42 mmol/L — ABNORMAL HIGH (ref 22–32)
Calcium: 8.3 mg/dL — ABNORMAL LOW (ref 8.9–10.3)
Chloride: 87 mmol/L — ABNORMAL LOW (ref 98–111)
Creatinine, Ser: 0.75 mg/dL (ref 0.44–1.00)
GFR, Estimated: >60 mL/min (ref >60)
Glucose, Bld: 134 mg/dL — ABNORMAL HIGH (ref 70–99)
Potassium: 2.7 mmol/L — CL (ref 3.5–5.1)
Sodium: 138 mmol/L (ref 135–145)

## 2022-03-26 LAB — BRAIN NATRIURETIC PEPTIDE: B Natriuretic Peptide: 574.5 pg/mL — ABNORMAL HIGH (ref 0.0–100.0)

## 2022-03-26 MED ORDER — POTASSIUM CHLORIDE CRYS ER 20 MEQ PO TBCR: 20.0000 meq | EXTENDED_RELEASE_TABLET | Freq: Every day | ORAL | 3 refills | Status: DC

## 2022-03-26 MED ORDER — SPIRONOLACTONE 25 MG PO TABS: 25.0000 mg | ORAL_TABLET | Freq: Every day | ORAL | 3 refills | Status: DC

## 2022-03-26 NOTE — Patient Instructions (Signed)
There has been no changes to your medications.  Labs done today, your results will be available in MyChart, we will contact you for abnormal readings.  Your physician recommends that you schedule a follow-up appointment in: February   If you have any questions or concerns before your next appointment please send [place] a message through [place] or call our office at [phone].    TO LEAVE A MESSAGE FOR THE NURSE SELECT OPTION 2, PLEASE LEAVE A MESSAGE INCLUDING: YOUR NAME DATE OF BIRTH CALL BACK NUMBER REASON FOR CALL**this is important as we prioritize the call backs  YOU WILL RECEIVE A CALL BACK THE SAME DAY AS LONG AS YOU CALL BEFORE 4:00 PM  At the [facility], you and your health needs are our priority. As part of our continuing mission to provide you with exceptional heart care, we have created designated Provider Care Teams. These Care Teams include your primary Cardiologist (physician) and Advanced Practice Providers (APPs- Physician Assistants and Nurse Practitioners) who all work together to provide you with the care you need, when you need it.   You may see any of the following providers on your designated Care Team at your next follow up: Dr Glori Bickers Dr Loralie Champagne Dr. Roxana Hires, NP Lyda Jester, [place] [facility], [place] Forestine Na, NP Audry Riles, PharmD   Please be sure to bring in all your medications bottles to every appointment.

## 2022-03-26 NOTE — Addendum Note (Signed)
Encounter addended by: Stanford Scotland, RN on: 03/26/2022 10:41 AM  Actions taken: Flowsheet accepted, Clinical Note Signed

## 2022-03-26 NOTE — Progress Notes (Signed)
ADVANCED HEART FAILURE CLINIC NOTE  Referring Physician: Lilian Coma., MD  Primary Care: Lilian Coma., MD Primary Cardiologist: N/A  HPI: Charlene Houston is a 76 y.o. 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 Charlene Houston history dates back to 2007 when she was diagnosed with metastatic breast cancer. At that time she reports having a lumpectomy with removal of >12 lymph nodes followed by 6-7 months of chemotherapy and radiation ending in 2008. During this time she was noted to have small possibly metastatic lung nodules that were too small for biopsy (as per patient). In April 2022, she underwent biopsy of a left lower lobe nodule; subsequently diagnosed with stage IV ([id number]) NSCLC, adenocarcinoma. CT CAP and bone scan following that revealed multiple bilateral nodules. She was started on Keytruda on 10/30/20.   According to Charlene Houston, she tolerated Beryle Flock very well for the first several months of therapy reporting good appetite; improvement in mobility and exercise capacity. Unfortunately, for several weeks prior to admission at [facility], she started to feel increasingly short of breath with worsening lower extremity edema, orthopnea and PND. She was admitted to [facility] in 9/23 with hypoxic respiratory failure that was felt to be secondary to pneumonitis from [facility] and new onset systolic heart failure (LVEF 35%-40%). Beryle Flock was discontinued; Charlene Houston was diuresed, started on GDMT and discharged. She has since been to [facility] clinic where she was noted to have significant pitting edema. She was started on Furoscix for 5 days.   Interval hx:  Continued improvement in dyspnea, appetite and LE edema after increasing lasix dose and starting GDMT. However, still remains very deconditioned.    Activity level/exercise tolerance:  Poor, limited due to deconditioning and recently diagnosed HFrEF / pneumonitis.  Orthopnea:  Sleeps on 2-3 Paroxysmal noctural dyspnea:  Improving Chest pain/pressure:  no Orthostatic lightheadedness:  no Palpitations:  no Lower extremity edema:  1+ Presyncope/syncope:  no Cough:  No  Past Medical History:  Diagnosis Date   Arthritis    Asthma    Breast cancer ([place])    Diabetes mellitus without complication ([place])    Hypertension    SBO (small bowel obstruction) ([place]) 06/2018    Current Outpatient Medications  Medication Sig Dispense Refill   acetaminophen (TYLENOL) 325 MG tablet Take 650 mg by mouth every 6 (six) hours as needed for mild pain or headache.     albuterol (ACCUNEB) 1.25 MG/3ML nebulizer solution Take 3 mLs by nebulization every 6 (six) hours as needed for wheezing.     carboxymethylcellulose (REFRESH PLUS) 0.5 % SOLN Place 1 drop into both eyes daily as needed (dry eyes).     carvedilol (COREG) 3.125 MG tablet Take 1 tablet (3.125 mg total) by mouth 2 (two) times daily. 180 tablet 3   cyanocobalamin (,VITAMIN B-12,) 1000 MCG/ML injection Inject 1,000 mcg into the muscle every 30 (thirty) days.     desonide (DESOWEN) 0.05 % cream Apply 1 Application topically as needed.     empagliflozin (JARDIANCE) 10 MG TABS tablet Take 1 tablet (10 mg total) by mouth daily. 30 tablet 11   Ergocalciferol 10 MCG (400 UNIT) TABS Take 800-1,200 Units by mouth See admin instructions. Taking 2 tabs ( 800 units) on Sunday and 3 tabs (1200) units daily except Sunday.     escitalopram (LEXAPRO) 5 MG tablet Take 5 mg  by mouth daily.     fluticasone (FLONASE) 50 MCG/ACT nasal spray Place 1 spray into both nostrils as needed for allergies or rhinitis.      folic acid (FOLVITE) 1 MG tablet Take 1 mg by mouth daily.     furosemide (LASIX) 40 MG tablet Take 1 tablet (40 mg total) by mouth 2 (two) times daily. 60 tablet 6   gabapentin (NEURONTIN) 300 MG  capsule Take 300 mg by mouth 2 (two) times daily. Taking twice daily     glimepiride (AMARYL) 2 MG tablet Take 2 mg by mouth daily.     losartan (COZAAR) 25 MG tablet Take 1 tablet (25 mg total) by mouth daily. 30 tablet 0   mirtazapine (REMERON) 15 MG tablet Take 15 mg by mouth at bedtime.     montelukast (SINGULAIR) 10 MG tablet Take 10 mg by mouth daily in the afternoon.     potassium chloride SA (KLOR-CON M) 20 MEQ tablet Take 1 tablet (20 mEq total) by mouth daily. 90 tablet 3   predniSONE (DELTASONE) 20 MG tablet Take 20 mg by mouth daily with breakfast.     simvastatin (ZOCOR) 40 MG tablet Take 40 mg by mouth daily.     witch hazel-glycerin (TUCKS) pad Apply 1 Application topically as needed for itching. 40 each 0   Furosemide (FUROSCIX) 80 MG/10ML CTKT Inject 80 mg into the skin as directed. (Patient not taking: Reported on 02/04/2022) 6 each 0   spironolactone (ALDACTONE) 25 MG tablet Take 1 tablet (25 mg total) by mouth daily. (Patient not taking: Reported on 03/26/2022) 30 tablet 2   No current facility-administered medications for this encounter.    Allergies  Allergen Reactions   Prozac [Fluoxetine Hcl] Other (See Comments)    Gittery   Vit D-Vit E-Safflower Oil Other (See Comments)    Not specified      Social History   Socioeconomic History   Marital status: Divorced    Spouse name: Not on file   Number of children: 2   Years of education: Not on file   Highest education level: High school graduate  Occupational History   Occupation: Retired  Tobacco Use   Smoking status: Former    Types: Cigarettes    Quit date: 1995    Years since quitting: 28.9   Smokeless tobacco: Never  Vaping Use   Vaping Use: Never used  Substance and Sexual Activity   Alcohol use: Not Currently   Drug use: Not Currently   Sexual activity: Not Currently  Other Topics Concern   Not on file  Social History Narrative   Not on file   Social Determinants of Health   Financial  Resource Strain: Low Risk  (01/04/2022)   Overall Financial Resource Strain (CARDIA)    Difficulty of Paying Living Expenses: Not very hard  Food Insecurity: No Food Insecurity (01/04/2022)   Hunger Vital Sign    Worried About Running Out of Food in the Last Year: Never true    Ran Out of Food in the Last Year: Never true  Transportation Needs: No Transportation Needs (01/04/2022)   PRAPARE - Hydrologist (Medical): No    Lack of Transportation (Non-Medical): No  Physical Activity: Not on file  Stress: Not on file  Social Connections: Not on file  Intimate Partner Violence: Not on file      Family History  Problem Relation Age of Onset   Hypertension Mother    Diabetes Mother  Cancer Father     PHYSICAL EXAM: Vitals:   03/26/22 1001  BP: 100/60  Pulse: 97  SpO2: 99%   GENERAL: elderly Charlene Houston; on 2L [place]; comfortable HEENT: Negative for arcus senilis or xanthelasma. There is no scleral icterus.  The mucous membranes are pink and moist.   NECK: Supple, No masses. Normal carotid upstrokes without bruits. No masses or thyromegaly.    CHEST: There are no chest wall deformities. There is no chest wall tenderness. Respirations are unlabored.  Lungs- coarse lung sounds; b/l inspiratory crackles CARDIAC:  JVP:8cm         Normal S1, S2  RRR. No murmurs, rubs or gallops.  Pulses are 2+ and symmetrical in upper and lower extremities. 1-2+ ABDOMEN: Soft, non-tender, non-distended. There are no masses or hepatomegaly. There are normal bowel sounds.  EXTREMITIES: Warm and well perfused with no cyanosis, clubbing.  LYMPHATIC: No axillary or supraclavicular lymphadenopathy.  NEUROLOGIC: Patient is oriented x3 with no focal or lateralizing neurologic deficits.  PSYCH: Patients affect is appropriate, there is no evidence of anxiety or depression.  SKIN: Warm and dry; no lesions or wounds.   DATA REVIEW  ECG: 01/04/22: NSR w/ LBBB  ECHO: 01/02/22: LVEF 35%-40%,  normal RV function.   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 CMR at the end of January. Will plan to wean off prednisone pending results. I suspect her LV function will improve.  NYHA class / AHA Stage:IIB; Volume status & Diuretics: significant improvement in volume status since our last visit. On lasix 71m BID currently. Will obtain REDs & BNP today. May down titrate to 452mdaily.  Vasodilators:losartan 2548maily; low BP today; [facility].  Beta-Blocker:Started on coreg 3.125m63mD. Will continue. MRA:PZW:CHENIDPOronolactone 25mg44mly; unsure if she is taking it. BMP pending. Cardiometabolic: started on jardiance 10mg 4mices therapies & Valvulopathies:not indicated currently Advanced therapies:not indicated currently; plan for repeat cardiac MR as noted above.   NSCLC of the left lung - Stage IV, adenocarcinoma of the left lower lung lobe by biopsy on 4/22. PET scan w/ multiple hypermetabolic b/l pulmonary nodules.  - Started on Keytruda in July 2022 with pneumonitis and cardiomyopathy in 11/2021 now on prolonged prednisone taper and HF GDMT.   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 - hypoxic respiratory failure during recent admit; on prednisone 30mg d9m managed by pulmonology and oncology.  - on [place] 2L  Charlene Houston Advanced Heart Failure Mechanical Circulatory Support

## 2022-03-26 NOTE — Telephone Encounter (Signed)
K 2.7. per Dr. Mauceri Nones Take an extra 60 meq of potassium today and then restart spironolactone 25 mg daily   Pt aware, agreeable, and verbalized understanding

## 2022-03-26 NOTE — Progress Notes (Signed)
ReDS Vest / Clip - 03/26/22 1000       ReDS Vest / Clip   Station Marker B    Ruler Value 38    ReDS Value Range Low volume    ReDS Actual Value 18

## 2022-04-22 ENCOUNTER — Emergency Department (HOSPITAL_COMMUNITY): Payer: Medicare HMO

## 2022-04-22 ENCOUNTER — Inpatient Hospital Stay (HOSPITAL_COMMUNITY)
Admission: EM | Admit: 2022-04-22 | Discharge: 2022-04-29 | DRG: 291 | Disposition: A | Discharge status: Skilled Nursing Facility | Payer: Medicare HMO | Attending: Internal Medicine | Admitting: Internal Medicine

## 2022-04-22 DIAGNOSIS — Z7984 Long term (current) use of oral hypoglycemic drugs: Secondary | ICD-10-CM

## 2022-04-22 DIAGNOSIS — Z79899 Other long term (current) drug therapy: Secondary | ICD-10-CM

## 2022-04-22 DIAGNOSIS — C3490 Malignant neoplasm of unspecified part of unspecified bronchus or lung: Secondary | ICD-10-CM | POA: Diagnosis present

## 2022-04-22 DIAGNOSIS — E1165 Type 2 diabetes mellitus with hyperglycemia: Secondary | ICD-10-CM | POA: Diagnosis present

## 2022-04-22 DIAGNOSIS — J9622 Acute and chronic respiratory failure with hypercapnia: Secondary | ICD-10-CM | POA: Diagnosis present

## 2022-04-22 DIAGNOSIS — G9340 Encephalopathy, unspecified: Secondary | ICD-10-CM | POA: Insufficient documentation

## 2022-04-22 DIAGNOSIS — Z833 Family history of diabetes mellitus: Secondary | ICD-10-CM

## 2022-04-22 DIAGNOSIS — Z90710 Acquired absence of both cervix and uterus: Secondary | ICD-10-CM

## 2022-04-22 DIAGNOSIS — I110 Hypertensive heart disease with heart failure: Secondary | ICD-10-CM | POA: Diagnosis present

## 2022-04-22 DIAGNOSIS — E871 Hypo-osmolality and hyponatremia: Secondary | ICD-10-CM | POA: Diagnosis present

## 2022-04-22 DIAGNOSIS — E875 Hyperkalemia: Secondary | ICD-10-CM | POA: Diagnosis present

## 2022-04-22 DIAGNOSIS — C7801 Secondary malignant neoplasm of right lung: Secondary | ICD-10-CM | POA: Diagnosis present

## 2022-04-22 DIAGNOSIS — G4730 Sleep apnea, unspecified: Secondary | ICD-10-CM | POA: Diagnosis present

## 2022-04-22 DIAGNOSIS — E874 Mixed disorder of acid-base balance: Secondary | ICD-10-CM | POA: Diagnosis present

## 2022-04-22 DIAGNOSIS — Z853 Personal history of malignant neoplasm of breast: Secondary | ICD-10-CM

## 2022-04-22 DIAGNOSIS — I509 Heart failure, unspecified: Secondary | ICD-10-CM

## 2022-04-22 DIAGNOSIS — C3492 Malignant neoplasm of unspecified part of left bronchus or lung: Secondary | ICD-10-CM | POA: Diagnosis present

## 2022-04-22 DIAGNOSIS — I5023 Acute on chronic systolic (congestive) heart failure: Secondary | ICD-10-CM | POA: Diagnosis present

## 2022-04-22 DIAGNOSIS — G9341 Metabolic encephalopathy: Secondary | ICD-10-CM | POA: Diagnosis present

## 2022-04-22 DIAGNOSIS — Z1152 Encounter for screening for COVID-19: Secondary | ICD-10-CM | POA: Diagnosis not present

## 2022-04-22 DIAGNOSIS — Z9981 Dependence on supplemental oxygen: Secondary | ICD-10-CM

## 2022-04-22 DIAGNOSIS — R739 Hyperglycemia, unspecified: Secondary | ICD-10-CM | POA: Diagnosis not present

## 2022-04-22 DIAGNOSIS — I447 Left bundle-branch block, unspecified: Secondary | ICD-10-CM | POA: Diagnosis present

## 2022-04-22 DIAGNOSIS — J9601 Acute respiratory failure with hypoxia: Secondary | ICD-10-CM | POA: Diagnosis present

## 2022-04-22 DIAGNOSIS — Z809 Family history of malignant neoplasm, unspecified: Secondary | ICD-10-CM

## 2022-04-22 DIAGNOSIS — J441 Chronic obstructive pulmonary disease with (acute) exacerbation: Secondary | ICD-10-CM | POA: Diagnosis present

## 2022-04-22 DIAGNOSIS — E876 Hypokalemia: Secondary | ICD-10-CM | POA: Diagnosis not present

## 2022-04-22 DIAGNOSIS — L89322 Pressure ulcer of left buttock, stage 2: Secondary | ICD-10-CM | POA: Diagnosis present

## 2022-04-22 DIAGNOSIS — E119 Type 2 diabetes mellitus without complications: Secondary | ICD-10-CM | POA: Diagnosis not present

## 2022-04-22 DIAGNOSIS — Z7952 Long term (current) use of systemic steroids: Secondary | ICD-10-CM

## 2022-04-22 DIAGNOSIS — R531 Weakness: Secondary | ICD-10-CM

## 2022-04-22 DIAGNOSIS — I10 Essential (primary) hypertension: Secondary | ICD-10-CM | POA: Diagnosis not present

## 2022-04-22 DIAGNOSIS — E662 Morbid (severe) obesity with alveolar hypoventilation: Secondary | ICD-10-CM | POA: Diagnosis present

## 2022-04-22 DIAGNOSIS — R0602 Shortness of breath: Secondary | ICD-10-CM | POA: Diagnosis present

## 2022-04-22 DIAGNOSIS — I5043 Acute on chronic combined systolic (congestive) and diastolic (congestive) heart failure: Principal | ICD-10-CM

## 2022-04-22 DIAGNOSIS — L8990 Pressure ulcer of unspecified site, unspecified stage: Secondary | ICD-10-CM | POA: Diagnosis present

## 2022-04-22 DIAGNOSIS — J9621 Acute and chronic respiratory failure with hypoxia: Secondary | ICD-10-CM | POA: Diagnosis present

## 2022-04-22 DIAGNOSIS — Z7401 Bed confinement status: Secondary | ICD-10-CM

## 2022-04-22 DIAGNOSIS — Z6833 Body mass index (BMI) 33.0-33.9, adult: Secondary | ICD-10-CM | POA: Diagnosis not present

## 2022-04-22 DIAGNOSIS — J9691 Respiratory failure, unspecified with hypoxia: Secondary | ICD-10-CM | POA: Diagnosis present

## 2022-04-22 DIAGNOSIS — Z87891 Personal history of nicotine dependence: Secondary | ICD-10-CM | POA: Diagnosis not present

## 2022-04-22 DIAGNOSIS — I5041 Acute combined systolic (congestive) and diastolic (congestive) heart failure: Secondary | ICD-10-CM | POA: Diagnosis present

## 2022-04-22 DIAGNOSIS — I9589 Other hypotension: Secondary | ICD-10-CM | POA: Diagnosis present

## 2022-04-22 DIAGNOSIS — Z8249 Family history of ischemic heart disease and other diseases of the circulatory system: Secondary | ICD-10-CM

## 2022-04-22 DIAGNOSIS — E669 Obesity, unspecified: Secondary | ICD-10-CM | POA: Diagnosis present

## 2022-04-22 DIAGNOSIS — J9692 Respiratory failure, unspecified with hypercapnia: Secondary | ICD-10-CM | POA: Diagnosis present

## 2022-04-22 DIAGNOSIS — I5021 Acute systolic (congestive) heart failure: Secondary | ICD-10-CM | POA: Diagnosis not present

## 2022-04-22 LAB — COMPREHENSIVE METABOLIC PANEL
ALT: 36 U/L (ref 0–44)
AST: 56 U/L — ABNORMAL HIGH (ref 15–41)
Albumin: 2.6 g/dL — ABNORMAL LOW (ref 3.5–5.0)
Alkaline Phosphatase: 63 U/L (ref 38–126)
Anion gap: 10 (ref 5–15)
BUN: 10 mg/dL (ref 8–23)
CO2: 41 mmol/L — ABNORMAL HIGH (ref 22–32)
Calcium: 9.1 mg/dL (ref 8.9–10.3)
Chloride: 87 mmol/L — ABNORMAL LOW (ref 98–111)
Creatinine, Ser: 1.06 mg/dL — ABNORMAL HIGH (ref 0.44–1.00)
GFR, Estimated: 55 mL/min — ABNORMAL LOW (ref >60)
Glucose, Bld: 532 mg/dL (ref 70–99)
Potassium: 6.3 mmol/L (ref 3.5–5.1)
Sodium: 138 mmol/L (ref 135–145)
Total Bilirubin: 1 mg/dL (ref 0.3–1.2)
Total Protein: 5.9 g/dL — ABNORMAL LOW (ref 6.5–8.1)

## 2022-04-22 LAB — GLUCOSE, CAPILLARY: Glucose-Capillary: 418 mg/dL — ABNORMAL HIGH (ref 70–99)

## 2022-04-22 LAB — CBC
HCT: 32.2 % — ABNORMAL LOW (ref 36.0–46.0)
Hemoglobin: 9.7 g/dL — ABNORMAL LOW (ref 12.0–15.0)
MCH: 31.6 pg (ref 26.0–34.0)
MCHC: 30.1 g/dL (ref 30.0–36.0)
MCV: 104.9 fL — ABNORMAL HIGH (ref 80.0–100.0)
Platelets: 159 10*3/uL (ref 150–400)
RBC: 3.07 MIL/uL — ABNORMAL LOW (ref 3.87–5.11)
RDW: 12.2 % (ref 11.5–15.5)
WBC: 12.8 10*3/uL — ABNORMAL HIGH (ref 4.0–10.5)
nRBC: 0.2 % (ref 0.0–0.2)

## 2022-04-22 LAB — I-STAT VENOUS BLOOD GAS, ED
Acid-Base Excess: 24 mmol/L — ABNORMAL HIGH (ref 0.0–2.0)
Bicarbonate: 52.3 mmol/L — ABNORMAL HIGH (ref 20.0–28.0)
Calcium, Ion: 1.09 mmol/L — ABNORMAL LOW (ref 1.15–1.40)
HCT: 33 % — ABNORMAL LOW (ref 36.0–46.0)
Hemoglobin: 11.2 g/dL — ABNORMAL LOW (ref 12.0–15.0)
O2 Saturation: 77 %
Potassium: 5.8 mmol/L — ABNORMAL HIGH (ref 3.5–5.1)
Sodium: 137 mmol/L (ref 135–145)
TCO2: >50 mmol/L — ABNORMAL HIGH (ref 22–32)
pCO2, Ven: 75 mmHg (ref 44–60)
pH, Ven: 7.451 — ABNORMAL HIGH (ref 7.25–7.43)
pO2, Ven: 43 mmHg (ref 32–45)

## 2022-04-22 LAB — RESP PANEL BY RT-PCR (RSV, FLU A&B, COVID)  RVPGX2
Influenza A by PCR: NEGATIVE
Influenza B by PCR: NEGATIVE
Resp Syncytial Virus by PCR: NEGATIVE
SARS Coronavirus 2 by RT PCR: NEGATIVE

## 2022-04-22 LAB — CBG MONITORING, ED
Glucose-Capillary: 475 mg/dL — ABNORMAL HIGH (ref 70–99)
Glucose-Capillary: 441 mg/dL — ABNORMAL HIGH (ref 70–99)

## 2022-04-22 LAB — POTASSIUM: Potassium: 5.7 mmol/L — ABNORMAL HIGH (ref 3.5–5.1)

## 2022-04-22 LAB — MAGNESIUM: Magnesium: 1.8 mg/dL (ref 1.7–2.4)

## 2022-04-22 LAB — TROPONIN I (HIGH SENSITIVITY): Troponin I (High Sensitivity): 42 ng/L — ABNORMAL HIGH (ref <18)

## 2022-04-22 LAB — AMMONIA: Ammonia: 43 umol/L — ABNORMAL HIGH (ref 9–35)

## 2022-04-22 LAB — BRAIN NATRIURETIC PEPTIDE: B Natriuretic Peptide: 1996 pg/mL — ABNORMAL HIGH (ref 0.0–100.0)

## 2022-04-22 MED ORDER — FUROSEMIDE 10 MG/ML IJ SOLN
40.0000 mg | Freq: Every day | INTRAMUSCULAR | Status: DC
  Administered 2022-04-23: 40 mg via INTRAVENOUS
  Filled 2022-04-22: qty 4

## 2022-04-22 MED ORDER — HYDRALAZINE HCL 20 MG/ML IJ SOLN: 5.0000 mg | Freq: Four times a day (QID) | INTRAMUSCULAR | Status: DC | PRN

## 2022-04-22 MED ORDER — ESCITALOPRAM OXALATE 10 MG PO TABS
5.0000 mg | ORAL_TABLET | Freq: Every day | ORAL | Status: DC
  Filled 2022-04-22: qty 1

## 2022-04-22 MED ORDER — HYDROCORTISONE 1 % EX OINT
TOPICAL_OINTMENT | Freq: Two times a day (BID) | CUTANEOUS | Status: DC
  Filled 2022-04-22: qty 28

## 2022-04-22 MED ORDER — INSULIN ASPART 100 UNIT/ML IJ SOLN
0.0000 [IU] | Freq: Three times a day (TID) | INTRAMUSCULAR | Status: DC
  Administered 2022-04-23 – 2022-04-24 (×2): 3 [IU] via SUBCUTANEOUS
  Administered 2022-04-24: 7 [IU] via SUBCUTANEOUS
  Administered 2022-04-24: 11 [IU] via SUBCUTANEOUS
  Administered 2022-04-25: 20 [IU] via SUBCUTANEOUS
  Administered 2022-04-25: 15 [IU] via SUBCUTANEOUS
  Administered 2022-04-25: 7 [IU] via SUBCUTANEOUS
  Administered 2022-04-26: 4 [IU] via SUBCUTANEOUS
  Administered 2022-04-26 – 2022-04-27 (×5): 7 [IU] via SUBCUTANEOUS
  Administered 2022-04-28: 4 [IU] via SUBCUTANEOUS
  Administered 2022-04-28: 11 [IU] via SUBCUTANEOUS
  Administered 2022-04-28: 4 [IU] via SUBCUTANEOUS
  Administered 2022-04-29: 3 [IU] via SUBCUTANEOUS
  Administered 2022-04-29: 11 [IU] via SUBCUTANEOUS
  Administered 2022-04-29: 4 [IU] via SUBCUTANEOUS

## 2022-04-22 MED ORDER — BUDESONIDE 0.25 MG/2ML IN SUSP
0.2500 mg | Freq: Two times a day (BID) | RESPIRATORY_TRACT | Status: DC
  Administered 2022-04-22 – 2022-04-23 (×2): 0.25 mg via RESPIRATORY_TRACT
  Filled 2022-04-22 (×2): qty 2

## 2022-04-22 MED ORDER — CARVEDILOL 3.125 MG PO TABS
3.1250 mg | ORAL_TABLET | Freq: Two times a day (BID) | ORAL | Status: DC
  Administered 2022-04-24 – 2022-04-29 (×11): 3.125 mg via ORAL
  Filled 2022-04-22 (×12): qty 1

## 2022-04-22 MED ORDER — IPRATROPIUM-ALBUTEROL 0.5-2.5 (3) MG/3ML IN SOLN
3.0000 mL | Freq: Four times a day (QID) | RESPIRATORY_TRACT | Status: DC
  Administered 2022-04-22 – 2022-04-24 (×6): 3 mL via RESPIRATORY_TRACT
  Filled 2022-04-22 (×6): qty 3

## 2022-04-22 MED ORDER — CYANOCOBALAMIN 1000 MCG/ML IJ SOLN: 1000.0000 ug | INTRAMUSCULAR | Status: DC

## 2022-04-22 MED ORDER — SODIUM CHLORIDE 0.9% FLUSH
3.0000 mL | Freq: Two times a day (BID) | INTRAVENOUS | Status: DC
  Administered 2022-04-22 – 2022-04-29 (×14): 3 mL via INTRAVENOUS

## 2022-04-22 MED ORDER — SODIUM BICARBONATE 8.4 % IV SOLN
50.0000 meq | Freq: Once | INTRAVENOUS | Status: AC
  Administered 2022-04-22: 50 meq via INTRAVENOUS
  Filled 2022-04-22: qty 50

## 2022-04-22 MED ORDER — SODIUM CHLORIDE 0.9% FLUSH: 3.0000 mL | INTRAVENOUS | Status: DC | PRN

## 2022-04-22 MED ORDER — ACETAMINOPHEN 325 MG PO TABS: 650.0000 mg | ORAL_TABLET | ORAL | Status: DC | PRN

## 2022-04-22 MED ORDER — SODIUM CHLORIDE 0.9 % IV SOLN: 250.0000 mL | INTRAVENOUS | Status: DC | PRN

## 2022-04-22 MED ORDER — POLYVINYL ALCOHOL 1.4 % OP SOLN: 1.0000 [drp] | OPHTHALMIC | Status: DC | PRN

## 2022-04-22 MED ORDER — MIRTAZAPINE 15 MG PO TABS: 15.0000 mg | ORAL_TABLET | Freq: Every day | ORAL | Status: DC

## 2022-04-22 MED ORDER — ONDANSETRON HCL 4 MG/2ML IJ SOLN: 4.0000 mg | Freq: Four times a day (QID) | INTRAMUSCULAR | Status: DC | PRN

## 2022-04-22 MED ORDER — FLUTICASONE PROPIONATE 50 MCG/ACT NA SUSP
1.0000 | NASAL | Status: DC | PRN
  Filled 2022-04-22: qty 16

## 2022-04-22 MED ORDER — GABAPENTIN 100 MG PO CAPS
100.0000 mg | ORAL_CAPSULE | Freq: Two times a day (BID) | ORAL | Status: DC
  Filled 2022-04-22: qty 1

## 2022-04-22 MED ORDER — CALCIUM GLUCONATE 10 % IV SOLN
1.0000 g | Freq: Once | INTRAVENOUS | Status: AC
  Administered 2022-04-22: 1 g via INTRAVENOUS
  Filled 2022-04-22: qty 10

## 2022-04-22 MED ORDER — ALBUTEROL SULFATE (2.5 MG/3ML) 0.083% IN NEBU: 3.0000 mL | INHALATION_SOLUTION | Freq: Four times a day (QID) | RESPIRATORY_TRACT | Status: DC | PRN

## 2022-04-22 MED ORDER — ACETAMINOPHEN 325 MG PO TABS
650.0000 mg | ORAL_TABLET | Freq: Four times a day (QID) | ORAL | Status: DC | PRN
  Administered 2022-04-24 – 2022-04-29 (×4): 650 mg via ORAL
  Filled 2022-04-22 (×4): qty 2

## 2022-04-22 MED ORDER — GABAPENTIN 300 MG PO CAPS: 300.0000 mg | ORAL_CAPSULE | Freq: Two times a day (BID) | ORAL | Status: DC

## 2022-04-22 MED ORDER — LINAGLIPTIN 5 MG PO TABS
5.0000 mg | ORAL_TABLET | Freq: Every day | ORAL | Status: DC
  Filled 2022-04-22: qty 1

## 2022-04-22 MED ORDER — SODIUM ZIRCONIUM CYCLOSILICATE 10 G PO PACK
10.0000 g | PACK | Freq: Once | ORAL | Status: AC
  Administered 2022-04-22: 10 g via ORAL
  Filled 2022-04-22: qty 1

## 2022-04-22 MED ORDER — INSULIN GLARGINE-YFGN 100 UNIT/ML ~~LOC~~ SOLN
10.0000 [IU] | Freq: Every day | SUBCUTANEOUS | Status: DC
  Administered 2022-04-22 – 2022-04-29 (×8): 10 [IU] via SUBCUTANEOUS
  Filled 2022-04-22 (×9): qty 0.1

## 2022-04-22 MED ORDER — WITCH HAZEL-GLYCERIN EX PADS
1.0000 | MEDICATED_PAD | CUTANEOUS | Status: DC | PRN
  Filled 2022-04-22: qty 100

## 2022-04-22 MED ORDER — MONTELUKAST SODIUM 10 MG PO TABS
10.0000 mg | ORAL_TABLET | Freq: Every day | ORAL | Status: DC
  Administered 2022-04-23 – 2022-04-29 (×7): 10 mg via ORAL
  Filled 2022-04-22 (×8): qty 1

## 2022-04-22 MED ORDER — FUROSEMIDE 10 MG/ML IJ SOLN
40.0000 mg | Freq: Once | INTRAMUSCULAR | Status: AC
  Administered 2022-04-22: 40 mg via INTRAVENOUS
  Filled 2022-04-22: qty 4

## 2022-04-22 MED ORDER — CARBOXYMETHYLCELLULOSE SODIUM 0.5 % OP SOLN: 1.0000 [drp] | Freq: Every day | OPHTHALMIC | Status: DC | PRN

## 2022-04-22 MED ORDER — ENOXAPARIN SODIUM 40 MG/0.4ML IJ SOSY
40.0000 mg | PREFILLED_SYRINGE | INTRAMUSCULAR | Status: DC
  Administered 2022-04-23 – 2022-04-29 (×7): 40 mg via SUBCUTANEOUS
  Filled 2022-04-22 (×7): qty 0.4

## 2022-04-22 MED ORDER — INSULIN ASPART 100 UNIT/ML IJ SOLN
10.0000 [IU] | Freq: Once | INTRAMUSCULAR | Status: AC
  Administered 2022-04-22: 10 [IU] via INTRAVENOUS

## 2022-04-22 MED ORDER — INSULIN ASPART 100 UNIT/ML IJ SOLN
15.0000 [IU] | Freq: Once | INTRAMUSCULAR | Status: AC
  Administered 2022-04-22: 15 [IU] via SUBCUTANEOUS

## 2022-04-22 NOTE — Progress Notes (Signed)
Pt is 100% on 2L; Bipap not needed at this time; will continue to montior

## 2022-04-22 NOTE — ED Triage Notes (Signed)
Pt arrives via EMS from home with reports of CBG reading high, increasing confusion over past 5-7 days and worsening mobility. Pts doctor took her off metformin. Pt had ox of lung cancer and on chronic 2L.

## 2022-04-22 NOTE — ED Provider Notes (Addendum)
[facility] EMERGENCY DEPARTMENT Provider Note   CSN: [id number] Arrival date & time: 04/22/22  1456     History  Chief Complaint  Patient presents with   Altered Mental Status   Hyperglycemia    Charlene Houston is a 77 y.o. female.  Pt with hx NSCLC, home o2, dm, presents via EMS with general weakness and confusion in past 5-7 days. EMS reports blood sugar read high, report of doctor taking her off of former metformin rx. Pt relatively poor historian - denies any specific physical c/o. No report of trauma/fall. No fever/chills. No headache. No chest pain or sob. No abd pain or nvd. No dysuria or gu c/o.   The history is provided by the patient, medical records and the EMS personnel. The history is limited by the condition of the patient.  Altered Mental Status Presenting symptoms: confusion   Associated symptoms: weakness   Associated symptoms: no abdominal pain, no fever, no headaches, no rash and no vomiting   Hyperglycemia Associated symptoms: altered mental status, confusion and weakness   Associated symptoms: no abdominal pain, no chest pain, no dysuria, no fever, no shortness of breath and no vomiting        Home Medications Prior to Admission medications   Medication Sig Start Date End Date Taking? Authorizing Provider  acetaminophen (TYLENOL) 325 MG tablet Take 650 mg by mouth every 6 (six) hours as needed for mild pain or headache.    [provider]  albuterol (ACCUNEB) 1.25 MG/3ML nebulizer solution Take 3 mLs by nebulization every 6 (six) hours as needed for wheezing. 01/16/21   [provider]  carboxymethylcellulose (REFRESH PLUS) 0.5 % SOLN Place 1 drop into both eyes daily as needed (dry eyes).    [provider]  carvedilol (COREG) 3.125 MG tablet Take 1 tablet (3.125 mg total) by mouth 2 (two) times daily. 03/01/22   Sabharwal, Aditya, DO  cyanocobalamin (,VITAMIN B-12,) 1000 MCG/ML injection Inject 1,000  mcg into the muscle every 30 (thirty) days.    [provider]  desonide (DESOWEN) 0.05 % cream Apply 1 Application topically as needed.    [provider]  empagliflozin (JARDIANCE) 10 MG TABS tablet Take 1 tablet (10 mg total) by mouth daily. 01/13/22   Consuelo Pandy, PA-C  Ergocalciferol 10 MCG (400 UNIT) TABS Take 800-1,200 Units by mouth See admin instructions. Taking 2 tabs ( 800 units) on Sunday and 3 tabs (1200) units daily except Sunday. 12/23/20   [provider]  escitalopram (LEXAPRO) 5 MG tablet Take 5 mg by mouth daily.    [provider]  fluticasone (FLONASE) 50 MCG/ACT nasal spray Place 1 spray into both nostrils as needed for allergies or rhinitis.     [provider]  folic acid (FOLVITE) 1 MG tablet Take 1 mg by mouth daily. 11/02/21   [provider]  Furosemide (FUROSCIX) 80 MG/10ML CTKT Inject 80 mg into the skin as directed. Patient not taking: Reported on 02/04/2022 01/13/22   Lyda Jester M, PA-C  furosemide (LASIX) 40 MG tablet Take 1 tablet (40 mg total) by mouth 2 (two) times daily. 02/04/22   Sabharwal, Aditya, DO  gabapentin (NEURONTIN) 300 MG capsule Take 300 mg by mouth 2 (two) times daily. Taking twice daily 10/26/17   [provider]  glimepiride (AMARYL) 2 MG tablet Take 2 mg by mouth daily. 09/05/21   [provider]  losartan (COZAAR) 25 MG tablet Take 1 tablet (25 mg total)  by mouth daily. 07/16/21   Chase Picket, MD  mirtazapine (REMERON) 15 MG tablet Take 15 mg by mouth at bedtime.    [provider]  montelukast (SINGULAIR) 10 MG tablet Take 10 mg by mouth daily in the afternoon.    [provider]  potassium chloride SA (KLOR-CON M) 20 MEQ tablet Take 1 tablet (20 mEq total) by mouth daily. 03/26/22   Sabharwal, Aditya, DO  predniSONE (DELTASONE) 20 MG tablet Take 20 mg by mouth daily with breakfast.    [provider]  simvastatin (ZOCOR) 40 MG  tablet Take 40 mg by mouth daily.    [provider]  spironolactone (ALDACTONE) 25 MG tablet Take 1 tablet (25 mg total) by mouth daily. 03/26/22   Sabharwal, Lindalou Hose, DO  witch hazel-glycerin (TUCKS) pad Apply 1 Application topically as needed for itching. 03/10/22   Davonna Belling, MD      Allergies    Prozac [fluoxetine hcl] and Vit d-vit e-safflower oil    Review of Systems   Review of Systems  Constitutional:  Negative for chills and fever.  HENT:  Negative for sore throat.   Eyes:  Negative for visual disturbance.  Respiratory:  Negative for cough and shortness of breath.   Cardiovascular:  Negative for chest pain and leg swelling.  Gastrointestinal:  Negative for abdominal pain, diarrhea and vomiting.  Genitourinary:  Negative for dysuria and flank pain.  Musculoskeletal:  Negative for back pain and neck pain.  Skin:  Negative for rash.  Neurological:  Positive for weakness. Negative for speech difficulty and headaches.  Hematological:  Does not bruise/bleed easily.  Psychiatric/Behavioral:  Positive for confusion.     Physical Exam Updated Vital Signs BP (!) 99/51   Pulse 66   Temp 97.7 F (36.5 C) (Axillary)   Resp 18   SpO2 98%  Physical Exam Vitals and nursing note reviewed.  Constitutional:      Appearance: Normal appearance. She is well-developed.  HENT:     Head: Atraumatic.     Nose: Nose normal.     Mouth/Throat:     Mouth: Mucous membranes are moist.  Eyes:     General: No scleral icterus.    Conjunctiva/sclera: Conjunctivae normal.     Pupils: Pupils are equal, round, and reactive to light.  Neck:     Vascular: No carotid bruit.     Trachea: No tracheal deviation.     Comments: No stiffness or rigidity Cardiovascular:     Rate and Rhythm: Normal rate and regular rhythm.     Pulses: Normal pulses.     Heart sounds: Normal heart sounds. No murmur heard.    No friction rub. No gallop.  Pulmonary:     Effort: Pulmonary effort is normal.  No respiratory distress.     Comments: Few rales bases Abdominal:     General: Bowel sounds are normal. There is no distension.     Palpations: Abdomen is soft.     Tenderness: There is no abdominal tenderness.  Genitourinary:    Comments: No cva tenderness.  Musculoskeletal:     Cervical back: Normal range of motion and neck supple. No rigidity. No muscular tenderness.     Comments: Bil ankle/lower leg symmetric edema.   Skin:    General: Skin is warm and dry.     Findings: No rash.     Comments: Superficial decubitus/pressure-moisture skin breakdown/excoriation to sacral area without necrotic or devitalized tissue. No crepitus. No purulent drainage or cellulitis  noted.   Neurological:     Mental Status: She is alert.     Comments: Alert, speech normal. Motor/sens grossly intact bil.   Psychiatric:        Mood and Affect: Mood normal.     ED Results / Procedures / Treatments   Labs (all labs ordered are listed, but only abnormal results are displayed) Results for orders placed or performed during the hospital encounter of 04/22/22  Resp panel by RT-PCR (RSV, Flu A&B, Covid) Anterior Nasal Swab   Specimen: Anterior Nasal Swab  Result Value Ref Range   SARS Coronavirus 2 by RT PCR NEGATIVE NEGATIVE   Influenza A by PCR NEGATIVE NEGATIVE   Influenza B by PCR NEGATIVE NEGATIVE   Resp Syncytial Virus by PCR NEGATIVE NEGATIVE  CBC  Result Value Ref Range   WBC 12.8 (H) 4.0 - 10.5 K/uL   RBC 3.07 (L) 3.87 - 5.11 MIL/uL   Hemoglobin 9.7 (L) 12.0 - 15.0 g/dL   HCT 32.2 (L) 36.0 - 46.0 %   MCV 104.9 (H) 80.0 - 100.0 fL   MCH 31.6 26.0 - 34.0 pg   MCHC 30.1 30.0 - 36.0 g/dL   RDW 12.2 11.5 - 15.5 %   Platelets 159 150 - 400 K/uL   nRBC 0.2 0.0 - 0.2 %  Comprehensive metabolic panel  Result Value Ref Range   Sodium 138 135 - 145 mmol/L   Potassium 6.3 (HH) 3.5 - 5.1 mmol/L   Chloride 87 (L) 98 - 111 mmol/L   CO2 41 (H) 22 - 32 mmol/L   Glucose, Bld 532 (HH) 70 - 99 mg/dL    BUN 10 8 - 23 mg/dL   Creatinine, Ser 1.06 (H) 0.44 - 1.00 mg/dL   Calcium 9.1 8.9 - 10.3 mg/dL   Total Protein 5.9 (L) 6.5 - 8.1 g/dL   Albumin 2.6 (L) 3.5 - 5.0 g/dL   AST 56 (H) 15 - 41 U/L   ALT 36 0 - 44 U/L   Alkaline Phosphatase 63 38 - 126 U/L   Total Bilirubin 1.0 0.3 - 1.2 mg/dL   GFR, Estimated 55 (L) >60 mL/min   Anion gap 10 5 - 15  Brain natriuretic peptide  Result Value Ref Range   B Natriuretic Peptide 1,996.0 (H) 0.0 - 100.0 pg/mL  CBG monitoring, ED  Result Value Ref Range   Glucose-Capillary 475 (H) 70 - 99 mg/dL     EKG None  Radiology CT Head Wo Contrast  Result Date: 04/22/2022 CLINICAL DATA:  Altered mental status EXAM: CT HEAD WITHOUT CONTRAST TECHNIQUE: Contiguous axial images were obtained from the base of the skull through the vertex without intravenous contrast. RADIATION DOSE REDUCTION: This exam was performed according to the departmental dose-optimization program which includes automated exposure control, adjustment of the mA and/or kV according to patient size and/or use of iterative reconstruction technique. COMPARISON:  07/05/2021 FINDINGS: Brain: No acute intracranial findings are seen. There are no signs of bleeding within the cranium. Cortical sulci are prominent. There is decreased density in periventricular white matter. There is no focal edema or mass effect. Vascular: Unremarkable. Skull: Unremarkable. Sinuses/Orbits: There are no air-fluid levels or significant mucosal thickening in the sinuses. Orbits are unremarkable. There is fluid density in few of the left mastoid air cells. There is decrease in number of right mastoid air cells. 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 This finding has not changed significantly. Other: None. IMPRESSION: No acute intracranial  findings are seen in noncontrast CT brain. Atrophy. Small-vessel disease. Left mastoid effusion. Electronically Signed   By: Elmer Picker M.D.    On: 04/22/2022 15:52   DG Chest Port 1 View  Result Date: 04/22/2022 CLINICAL DATA:  Weakness EXAM: PORTABLE CHEST 1 VIEW COMPARISON:  02/20/2022, chest CT 01/01/2022 FINDINGS: Right-sided central venous port tip at the right atrium. Cardiomegaly with vascular congestion and probable mild pulmonary edema. Patchy airspace opacities in the upper lobes and left lung base. No pneumothorax. IMPRESSION: Cardiomegaly with vascular congestion and probable mild pulmonary edema. Patchy airspace disease in the upper lobes and left lung base may be due to edema or superimposed pneumonia. Electronically Signed   By: Donavan Foil M.D.   On: 04/22/2022 15:43    Procedures Procedures    Medications Ordered in ED Medications  furosemide (LASIX) injection 40 mg (has no administration in time range)  insulin aspart (novoLOG) injection 10 Units (has no administration in time range)  sodium bicarbonate injection 50 mEq (has no administration in time range)  calcium gluconate inj 10% (1 g) URGENT USE ONLY! (has no administration in time range)  sodium zirconium cyclosilicate (LOKELMA) packet 10 g (has no administration in time range)    ED Course/ Medical Decision Making/ A&P                           Medical Decision Making Problems Addressed: Acute on chronic combined systolic and diastolic CHF (congestive heart failure) ([place]): acute illness or injury with systemic symptoms that poses a threat to life or bodily functions Generalized weakness: acute illness or injury with systemic symptoms that poses a threat to life or bodily functions Hyperglycemia: acute illness or injury with systemic symptoms that poses a threat to life or bodily functions Hyperkalemia: acute illness or injury with systemic symptoms that poses a threat to life or bodily functions  Amount and/or Complexity of Data Reviewed Independent Historian: EMS    Details: hx External Data Reviewed: labs, radiology and notes. Labs: ordered.  Decision-making details documented in ED Course. Radiology: ordered and independent interpretation performed. Decision-making details documented in ED Course. ECG/medicine tests: ordered and independent interpretation performed. Decision-making details documented in ED Course.  Risk Prescription drug management. Decision regarding hospitalization.   Iv ns. Continuous pulse ox and cardiac monitoring. Labs ordered/sent. Imaging ordered.   Differential diagnosis includes  uti, dehydration/aki, etc. Dispo decision including potential need for admission considered - will get labs and imaging and reassess.   Reviewed nursing notes and prior charts for additional history. External reports reviewed. Additional history from: EMS.   Glucose high. Ivf bolus.   Cardiac monitor: sinus rhythm, rate 80.  Labs reviewed/interpreted by me - bnp high. Cxr w vasc congestion/chf, bil leg edema/symmetric - lasix iv. K high. Glucose high. Insulin iv, ca glu iv, hco3 iv, lokelma.   Xrays reviewed/interpreted by me - vascular congestion/chf.   CT reviewed/interpreted by me - no hem.   Medicine consulted for admission. Discussed w hospitalists - will admit.   CRITICAL CARE  RE: generalized weakness, hyperkalemia requiring parenteral therapy, acute/chr chf Performed by: Mirna Mires Total critical care time: 45 minutes Critical care time was exclusive of separately billable procedures and treating other patients. Critical care was necessary to treat or prevent imminent or life-threatening deterioration. Critical care was time spent personally by me on the following activities: development of treatment plan with patient and/or surrogate as well as nursing, discussions with  consultants, evaluation of patient's response to treatment, examination of patient, obtaining history from patient or surrogate, ordering and performing treatments and interventions, ordering and review of laboratory studies, ordering and  review of radiographic studies, pulse oximetry and re-evaluation of patient's condition.            Final Clinical Impression(s) / ED Diagnoses Final diagnoses:  Acute on chronic combined systolic and diastolic CHF (congestive heart failure) (HCC)  Generalized weakness  Hyperkalemia    Rx / DC Orders ED Discharge Orders     None          Lajean Saver, MD 04/22/22 1753

## 2022-04-22 NOTE — H&P (Signed)
History and Physical    Charlene Houston VOZ:[id number] DOB: 09-12-1945 DOA: 04/22/2022  PCP: Charlene Houston., MD (Confirm with patient/family/NH records and if not entered, this has to be entered at [facility] point of entry) Patient coming from: Home  I have personally briefly reviewed patient's old medical records in [place]  Chief Complaint: Feeling tired  HPI: Charlene Houston is a 77 y.o. 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  Patient is very sleepy and confused unable to provide any history, or history provided by daughter at bedside.  Daughter reported that about 2 weeks ago, patient's medications were adjusted with recently episode of hypoglycemia earlier metformin and Jardiance both were discontinued.  About the same time, her cardiology started her on Coreg about the same time.  However, in the last 1+ week, family noticed patient continued to have episodes of generalized weakness and daytime sleepiness.  And she became idle and bedbound.  No specific complaints to the family member.  Yesterday, family member took patient to PCP again, to address the frequent drowsiness problems and primidone was discontinued and patient was supposed to start Topamax which she is not yet taking.  No significant event yesterday evening, but this morning, daughter found patient lying in the bed unresponsive and called 911.  ED Course: Afebrile, blood pressure borderline low which appears to be her baseline, nonhypoxic.  CT head showed no acute findings.  Lab work showed WBC 12.8, COVID-negative, K6.3, creatinine 1.0, bicarb 41, glucose 532.  Checks x-ray showed pulmonary congestion compatible with CHF, and patient was given 40 mg IV  Lasix in the ED.  VBG 7.4 5/75/52  Review of Systems: Unable to perform, patient has altered mentations  Past Medical History:  Diagnosis Date   Arthritis    Asthma    Breast cancer ([place])    Diabetes mellitus without complication ([place])    Hypertension    SBO (small bowel obstruction) ([place]) 06/2018    Past Surgical History:  Procedure Laterality Date   ABDOMINAL HYSTERECTOMY     THUMB AMPUTATION Left    PARTIAL   TONSILLECTOMY       reports that she quit smoking about 29 years ago. Her smoking use included cigarettes. She has never used smokeless tobacco. She reports that she does not currently use alcohol. She reports that she does not currently use drugs.  Allergies  Allergen Reactions   Prozac [Fluoxetine Hcl] Other (See Comments)    Gittery   Vit D-Vit E-Safflower Oil Other (See Comments)    Not specified    Family History  Problem Relation Age of Onset   Hypertension Mother    Diabetes Mother    Cancer Father      Prior to Admission medications   Medication Sig Start Date End Date Taking? Authorizing Provider  acetaminophen (TYLENOL) 325 MG tablet Take 650 mg by mouth every 6 (six) hours as needed for mild pain or headache.    [provider]  albuterol (ACCUNEB) 1.25 MG/3ML nebulizer solution Take 3 mLs by nebulization every 6 (six) hours as needed for wheezing. 01/16/21   [provider]  carboxymethylcellulose (REFRESH PLUS) 0.5 % SOLN Place 1 drop into both eyes daily as needed (dry eyes).    [provider]  carvedilol (COREG) 3.125 MG tablet Take 1 tablet (3.125 mg total) by mouth 2 (two) times daily. 03/01/22   Sabharwal, Aditya, DO  cyanocobalamin (,VITAMIN B-12,) 1000 MCG/ML injection Inject 1,000 mcg into the muscle every 30 (thirty) days.    [provider]  desonide (DESOWEN) 0.05 % cream Apply 1 Application topically as needed.    [provider]  empagliflozin (JARDIANCE) 10 MG TABS tablet Take 1 tablet  (10 mg total) by mouth daily. 01/13/22   Consuelo Pandy, PA-C  Ergocalciferol 10 MCG (400 UNIT) TABS Take 800-1,200 Units by mouth See admin instructions. Taking 2 tabs ( 800 units) on Sunday and 3 tabs (1200) units daily except Sunday. 12/23/20   [provider]  escitalopram (LEXAPRO) 5 MG tablet Take 5 mg by mouth daily.    [provider]  fluticasone (FLONASE) 50 MCG/ACT nasal spray Place 1 spray into both nostrils as needed for allergies or rhinitis.     [provider]  folic acid (FOLVITE) 1 MG tablet Take 1 mg by mouth daily. 11/02/21   [provider]  Furosemide (FUROSCIX) 80 MG/10ML CTKT Inject 80 mg into the skin as directed. Patient not taking: Reported on 02/04/2022 01/13/22   Lyda Jester M, PA-C  furosemide (LASIX) 40 MG tablet Take 1 tablet (40 mg total) by mouth 2 (two) times daily. 02/04/22   Sabharwal, Aditya, DO  gabapentin (NEURONTIN) 300 MG capsule Take 300 mg by mouth 2 (two) times daily. Taking twice daily 10/26/17   [provider]  glimepiride (AMARYL) 2 MG tablet Take 2 mg by mouth daily. 09/05/21   [provider]  losartan (COZAAR) 25 MG tablet Take 1 tablet (25 mg total) by mouth daily. 07/16/21   Chase Picket, MD  mirtazapine (REMERON) 15 MG tablet Take 15 mg by mouth at bedtime.    [provider]  montelukast (SINGULAIR) 10 MG tablet Take 10 mg by mouth daily in the afternoon.    [provider]  potassium chloride SA (KLOR-CON M) 20 MEQ tablet Take 1 tablet (20 mEq total) by mouth daily. 03/26/22   Sabharwal, Aditya, DO  predniSONE (DELTASONE) 20 MG tablet Take 20 mg by mouth daily with breakfast.    [provider]  simvastatin (ZOCOR) 40 MG tablet Take 40 mg by mouth daily.    [provider]  spironolactone (ALDACTONE) 25 MG tablet Take 1 tablet (25 mg total) by mouth daily. 03/26/22   Sabharwal, Lindalou Hose, DO  witch hazel-glycerin (TUCKS) pad Apply 1 Application  topically as needed for itching. 03/10/22   Davonna Belling, MD    Physical Exam: Vitals:   04/22/22 1645 04/22/22 1700 04/22/22 1715 04/22/22 1730  BP: (!) 98/48 (!) 103/50 (!) 104/54 (!) 109/48  Pulse: 68 68 72 72  Resp:    18  Temp:      TempSrc:      SpO2: 100% 99% 99% 100%    Constitutional: NAD, calm, comfortable Vitals:   04/22/22 1645 04/22/22 1700 04/22/22 1715 04/22/22 1730  BP: (!) 98/48 (!) 103/50 (!) 104/54 (!) 109/48  Pulse: 68 68 72 72  Resp:    18  Temp:      TempSrc:      SpO2: 100% 99% 99% 100%   Eyes: PERRL, lids and conjunctivae normal ENMT: Mucous membranes are moist. Posterior pharynx clear of any exudate or lesions.Normal dentition.  Neck: normal, supple, no masses, no thyromegaly Respiratory: clear to auscultation bilaterally, no wheezing, fine crackles on  bilateral lower fields, increasing respiratory effort. No accessory muscle use.  Cardiovascular: Regular rate and rhythm, no murmurs / rubs / gallops. No extremity edema. 2+ pedal pulses. No carotid bruits.  Abdomen: no tenderness, no masses palpated. No hepatosplenomegaly. Bowel sounds positive.  Musculoskeletal: no clubbing / cyanosis. No joint deformity upper and lower extremities. Good ROM, no contractures. Normal muscle tone.  Skin: Left buttock stage II pressure ulcer Neurologic: No facial droops, moving all limbs, following simple commands Psychiatric:  Arousable lethargic and confused Labs on Admission: I have personally reviewed following labs and imaging studies  CBC: Recent Labs  Lab 04/22/22 1537 04/22/22 1822  WBC 12.8*  --   HGB 9.7* 11.2*  HCT 32.2* 33.0*  MCV 104.9*  --   PLT 159  --    Basic Metabolic Panel: Recent Labs  Lab 04/22/22 1537 04/22/22 1822  NA 138 137  K 6.3* 5.8*  CL 87*  --   CO2 41*  --   GLUCOSE 532*  --   BUN 10  --   CREATININE 1.06*  --   CALCIUM 9.1  --    GFR: CrCl cannot be calculated (Unknown ideal weight.). Liver Function  Tests: Recent Labs  Lab 04/22/22 1537  AST 56*  ALT 36  ALKPHOS 63  BILITOT 1.0  PROT 5.9*  ALBUMIN 2.6*   No results for input(s): "LIPASE", "AMYLASE" in the last 168 hours. No results for input(s): "AMMONIA" in the last 168 hours. Coagulation Profile: No results for input(s): "INR", "PROTIME" in the last 168 hours. Cardiac Enzymes: No results for input(s): "CKTOTAL", "CKMB", "CKMBINDEX", "TROPONINI" in the last 168 hours. BNP (last 3 results) No results for input(s): "PROBNP" in the last 8760 hours. HbA1C: No results for input(s): "HGBA1C" in the last 72 hours. CBG: Recent Labs  Lab 04/22/22 1514  GLUCAP 475*   Lipid Profile: No results for input(s): "CHOL", "HDL", "LDLCALC", "TRIG", "CHOLHDL", "LDLDIRECT" in the last 72 hours. Thyroid Function Tests: No results for input(s): "TSH", "T4TOTAL", "FREET4", "T3FREE", "THYROIDAB" in the last 72 hours. Anemia Panel: No results for input(s): "VITAMINB12", "FOLATE", "FERRITIN", "TIBC", "IRON", "RETICCTPCT" in the last 72 hours. Urine analysis:    Component Value Date/Time   COLORURINE STRAW (A) 02/20/2022 2002   APPEARANCEUR CLEAR 02/20/2022 2002   LABSPEC 1.021 02/20/2022 2002   PHURINE 7.0 02/20/2022 2002   GLUCOSEU >=500 (A) 02/20/2022 2002   HGBUR NEGATIVE 02/20/2022 2002   BILIRUBINUR NEGATIVE 02/20/2022 2002   KETONESUR NEGATIVE 02/20/2022 2002   PROTEINUR NEGATIVE 02/20/2022 2002   NITRITE NEGATIVE 02/20/2022 2002   LEUKOCYTESUR SMALL (A) 02/20/2022 2002    Radiological Exams on Admission: CT Head Wo Contrast  Result Date: 04/22/2022 CLINICAL DATA:  Altered mental status EXAM: CT HEAD WITHOUT CONTRAST TECHNIQUE: Contiguous axial images were obtained from the base of the skull through the vertex without intravenous contrast. RADIATION DOSE REDUCTION: This exam was performed according to the departmental dose-optimization program which includes automated exposure control, adjustment of the mA and/or kV according to  patient size and/or use of iterative reconstruction technique. COMPARISON:  07/05/2021 FINDINGS: Brain: No acute intracranial findings are seen. There are no signs of bleeding within the cranium. Cortical sulci are prominent. There is decreased density in periventricular white matter. There is no focal edema or mass effect. Vascular: Unremarkable. Skull: Unremarkable. Sinuses/Orbits: There are no air-fluid levels or significant mucosal thickening in the sinuses. Orbits are unremarkable. There is fluid density in few of the left mastoid air cells. There is decrease  in number of right mastoid air cells. 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 This finding has not changed significantly. Other: None. IMPRESSION: No acute intracranial findings are seen in noncontrast CT brain. Atrophy. Small-vessel disease. Left mastoid effusion. Electronically Signed   By: Elmer Picker M.D.   On: 04/22/2022 15:52   DG Chest Port 1 View  Result Date: 04/22/2022 CLINICAL DATA:  Weakness EXAM: PORTABLE CHEST 1 VIEW COMPARISON:  02/20/2022, chest CT 01/01/2022 FINDINGS: Right-sided central venous port tip at the right atrium. Cardiomegaly with vascular congestion and probable mild pulmonary edema. Patchy airspace opacities in the upper lobes and left lung base. No pneumothorax. IMPRESSION: Cardiomegaly with vascular congestion and probable mild pulmonary edema. Patchy airspace disease in the upper lobes and left lung base may be due to edema or superimposed pneumonia. Electronically Signed   By: Donavan Foil M.D.   On: 04/22/2022 15:43    EKG: Independently reviewed.  Sinus chronic LBBB,  Assessment/Plan Principal Problem:   CHF (congestive heart failure) (HCC) Active Problems:   Sleep apnea   Diabetes mellitus type 2 in nonobese (HCC)   Acute respiratory failure with hypoxia (HCC)   Acute combined systolic and diastolic congestive heart failure ([place])  (please populate well all  problems here in Problem List. (For example, if patient is on BP meds at home and you resume or decide to hold them, it is a problem that needs to be her. Same for CAD, COPD, HLD and so on)  Acute/subacute metabolic encephalopathy, KYH=06 -Went to VBG result with PCCM attending, appears that patient has decompensated acute respiratory acidosis plus metabolic alkalosis -PCCM recommends trial of BiPAP -Set BiPAP with 10/5, respiratory paged -Other Ddx, workup is high but unlikely HHS, and given her volume status overloading will not start IV fluid.  Use aggressive sliding scale and Lantus to control glucose as patient is insulin nave.  UA is pending.  Pattern of infiltrates on x-ray compatible with CHF unlikely pneumonia. CT head showed no bleeding or fracture -D/W daughter at bedside, tonight will focus on treating the acid-base abnormalities and CHF decompensation, if no significant improvement of mentation, consider further brain imaging.  Daughter expressed understanding and agreed.  Hyperkalemia -Hold off potassium supplement, Aldactone and ARB -Received a cocktail of IV insulin and Lokelma -Repeat K at 6:00 trending down to 5.8, will order another repeat K tonight and tomorrow morning.  Acute on chronic HFrEF decompensation -Received IV Lasix 40 mg x 1 in the ED -Lasix 40 mg daily -Check echocardiogram -Continue Coreg  IIDM with Hyperglycemia -Lantus and resistant sliding scale -Add Januvia  Acute on chronic hypotension -Blood pressure above baseline with SBP lower 100s -Might related to decompensated CHF, doubt patient is in cardiogenic shock -Lasix as above -Will check orthostatic vital signs and PT evaluation once patient mentation improves, according to family, patient used to be on midodrine  Combined acute respiratory acidosis and metabolic alkalosis -As above.  Question of asthma/COPD exacerbation -No formal diagnosis of either asthma or COPD in the past, no formal lung  function test. -Given the VBG finding, ordered DuoNeb every 6 hours and as needed albuterol plus inhaled steroid -BiPAP as above -Outpatient pulmonary follow-up for lung function test  History of non-small cell lung cancer -Left-sided metastatic to right side status post immunotherapy -Off immunotherapy about 6 months ago, under active surveillance by oncology.  CT chest today showed a stable picture of lung nodules on the right side  Stage II  pressure ulcer on left buttock -Developed in the last 1 to 2 weeks as per family, will consult wound care.   DVT prophylaxis: Lovenox Code Status: Full code Family Communication: Daughter at bedside Disposition Plan: Patient is sick with significant recently decompensation and mentation and ambulation function, etiology is multifactorial, likely combination of CHF, COPD/asthma exacerbation, requiring inpatient management, expect more than 2 midnight hospital stay Consults called: [facility] Admission status: PCU for BIPAP   Lequita Halt MD [facility] Pager [phone]  04/22/2022, 6:30 PM

## 2022-04-22 NOTE — ED Notes (Signed)
Critical reported to Dr. Roosevelt Locks

## 2022-04-22 NOTE — ED Notes (Signed)
ED TO INPATIENT HANDOFF REPORT  ED Nurse Name and Phone #: Andi Hence, RN [phone]  S Name/Age/Gender Ladoris Gene 77 y.o. female Room/Bed: 038C/038C  Code Status   Code Status: Full Code  Home/SNF/Other Home Patient oriented to: self  Is this baseline? Yes   Triage Complete: Triage complete  Chief Complaint CHF (congestive heart failure) ([place]) [I50.9] Encephalopathy [G93.40]  Triage Note Pt arrives via EMS from home with reports of CBG reading high, increasing confusion over past 5-7 days and worsening mobility. Pts doctor took her off metformin. Pt had ox of lung cancer and on chronic 2L.    Allergies Allergies  Allergen Reactions   Prozac [Fluoxetine Hcl] Other (See Comments)    Gittery   Vit D-Vit E-Safflower Oil Other (See Comments)    Not specified    Level of Care/Admitting Diagnosis ED Disposition     ED Disposition  Admit   Condition  --   [place]: [facility] [place] [100100]  Level of Care: Progressive [102]  Admit to Progressive based on following criteria: RESPIRATORY PROBLEMS hypoxemic/hypercapnic respiratory failure that is responsive to NIPPV (BiPAP) or High Flow Nasal Cannula (6-80 lpm). Frequent assessment/intervention, no > Q2 hrs < Q4 hrs, to maintain oxygenation and pulmonary hygiene.  May admit patient to [facility] or [facility] if equivalent level of care is available:: No  Covid Evaluation: Confirmed COVID Negative  Diagnosis: Encephalopathy [176160]  Admitting Physician: Lequita Halt [[id number]]  Attending Physician: Lequita Halt [[id number]]  Certification:: I certify this patient will need inpatient services for at least 2 midnights          B Medical/Surgery History Past Medical History:  Diagnosis Date   Arthritis    Asthma    Breast cancer ([place])    Diabetes mellitus without complication ([place])    Hypertension    SBO (small bowel obstruction) ([place]) 06/2018   Past Surgical History:   Procedure Laterality Date   ABDOMINAL HYSTERECTOMY     THUMB AMPUTATION Left    PARTIAL   TONSILLECTOMY       A IV Location/Drains/Wounds Patient Lines/Drains/Airways Status     Active Line/Drains/Airways     Name Placement date Placement time Site Days   Peripheral IV 04/22/22 20 G Anterior;Right Forearm 04/22/22  1714  Forearm  less than 1            Intake/Output Last 24 hours No intake or output data in the 24 hours ending 04/22/22 1943  Labs/Imaging Results for orders placed or performed during the hospital encounter of 04/22/22 (from the past 48 hour(s))  CBG monitoring, ED     Status: Abnormal   Collection Time: 04/22/22  3:14 PM  Result Value Ref Range   Glucose-Capillary 475 (H) 70 - 99 mg/dL    Comment: Glucose reference range applies only to samples taken after fasting for at least 8 hours.  Resp panel by RT-PCR (RSV, Flu A&B, Covid) Anterior Nasal Swab     Status: None   Collection Time: 04/22/22  3:22 PM   Specimen: Anterior Nasal Swab  Result Value Ref Range   SARS Coronavirus 2 by RT PCR NEGATIVE NEGATIVE    Comment: (NOTE) SARS-CoV-2 target nucleic acids are NOT DETECTED.  The SARS-CoV-2 RNA is generally detectable in upper respiratory specimens during the acute phase of infection. The lowest concentration of SARS-CoV-2 viral copies this assay can detect is 138 copies/mL. A negative result does not preclude SARS-Cov-2 infection and should  not be used as the sole basis for treatment or other patient management decisions. A negative result may occur with  improper specimen collection/handling, submission of specimen other than nasopharyngeal swab, presence of viral mutation(s) within the areas targeted by this assay, and inadequate number of viral copies(<138 copies/mL). A negative result must be combined with clinical observations, patient history, and epidemiological information. The expected result is Negative.  Fact Sheet for Patients:   EntrepreneurPulse.com.au  Fact Sheet for Healthcare Providers:  IncredibleEmployment.be  This test is no t yet approved or cleared by the [place] FDA and  has been authorized for detection and/or diagnosis of SARS-CoV-2 by FDA under an Emergency Use Authorization (EUA). This EUA will remain  in effect (meaning this test can be used) for the duration of the COVID-19 declaration under Section 564(b)(1) of the Act, 21 U.S.C.section 360bbb-3(b)(1), unless the authorization is terminated  or revoked sooner.       Influenza A by PCR NEGATIVE NEGATIVE   Influenza B by PCR NEGATIVE NEGATIVE    Comment: (NOTE) The Xpert Xpress SARS-CoV-2/FLU/RSV plus assay is intended as an aid in the diagnosis of influenza from Nasopharyngeal swab specimens and should not be used as a sole basis for treatment. Nasal washings and aspirates are unacceptable for Xpert Xpress SARS-CoV-2/FLU/RSV testing.  Fact Sheet for Patients: EntrepreneurPulse.com.au  Fact Sheet for Healthcare Providers: IncredibleEmployment.be  This test is not yet approved or cleared by the [place] FDA and has been authorized for detection and/or diagnosis of SARS-CoV-2 by FDA under an Emergency Use Authorization (EUA). This EUA will remain in effect (meaning this test can be used) for the duration of the COVID-19 declaration under Section 564(b)(1) of the Act, 21 U.S.C. section 360bbb-3(b)(1), unless the authorization is terminated or revoked.     Resp Syncytial Virus by PCR NEGATIVE NEGATIVE    Comment: (NOTE) Fact Sheet for Patients: EntrepreneurPulse.com.au  Fact Sheet for Healthcare Providers: IncredibleEmployment.be  This test is not yet approved or cleared by the [place] FDA and has been authorized for detection and/or diagnosis of SARS-CoV-2 by FDA under an Emergency Use Authorization (EUA).  This EUA will remain in effect (meaning this test can be used) for the duration of the COVID-19 declaration under Section 564(b)(1) of the Act, 21 U.S.C. section 360bbb-3(b)(1), unless the authorization is terminated or revoked.  Performed at [facility], [place] [address] [place], [place] [address]   CBC     Status: Abnormal   Collection Time: 04/22/22  3:37 PM  Result Value Ref Range   WBC 12.8 (H) 4.0 - 10.5 K/uL   RBC 3.07 (L) 3.87 - 5.11 MIL/uL   Hemoglobin 9.7 (L) 12.0 - 15.0 g/dL   HCT 32.2 (L) 36.0 - 46.0 %   MCV 104.9 (H) 80.0 - 100.0 fL   MCH 31.6 26.0 - 34.0 pg   MCHC 30.1 30.0 - 36.0 g/dL   RDW 12.2 11.5 - 15.5 %   Platelets 159 150 - 400 K/uL   nRBC 0.2 0.0 - 0.2 %    Comment: Performed at [facility] [address], [place], [place] [address]  Comprehensive metabolic panel     Status: Abnormal   Collection Time: 04/22/22  3:37 PM  Result Value Ref Range   Sodium 138 135 - 145 mmol/L   Potassium 6.3 (HH) 3.5 - 5.1 mmol/L    Comment: CRITICAL RESULT CALLED TO, READ BACK BY AND VERIFIED WITH L.MILES,RN @1628  04/22/2022 VANG.J   Chloride  87 (L) 98 - 111 mmol/L   CO2 41 (H) 22 - 32 mmol/L   Glucose, Bld 532 (HH) 70 - 99 mg/dL    Comment: CRITICAL RESULT CALLED TO, READ BACK BY AND VERIFIED WITH L.MILES,RN @1628  04/22/2022 VANG.J Glucose reference range applies only to samples taken after fasting for at least 8 hours.    BUN 10 8 - 23 mg/dL   Creatinine, Ser 1.06 (H) 0.44 - 1.00 mg/dL   Calcium 9.1 8.9 - 10.3 mg/dL   Total Protein 5.9 (L) 6.5 - 8.1 g/dL   Albumin 2.6 (L) 3.5 - 5.0 g/dL   AST 56 (H) 15 - 41 U/L   ALT 36 0 - 44 U/L   Alkaline Phosphatase 63 38 - 126 U/L   Total Bilirubin 1.0 0.3 - 1.2 mg/dL   GFR, Estimated 55 (L) >60 mL/min    Comment: (NOTE) Calculated using the CKD-EPI Creatinine Equation (2021)    Anion gap 10 5 - 15    Comment: Performed at [facility], [place] [address], [place], [place] [address]  Brain natriuretic  peptide     Status: Abnormal   Collection Time: 04/22/22  3:38 PM  Result Value Ref Range   B Natriuretic Peptide 1,996.0 (H) 0.0 - 100.0 pg/mL    Comment: Performed at [facility] [address] [place], [place] [address]  Troponin I (High Sensitivity)     Status: Abnormal   Collection Time: 04/22/22  6:14 PM  Result Value Ref Range   Troponin I (High Sensitivity) 42 (H) <18 ng/L    Comment: (NOTE) Elevated high sensitivity troponin I (hsTnI) values and significant  changes across serial measurements may suggest ACS but many other  chronic and acute conditions are known to elevate hsTnI results.  Refer to the "Links" section for chest pain algorithms and additional  guidance. Performed at [facility], [place] [address], [place], [place] [address]   Magnesium     Status: None   Collection Time: 04/22/22  6:14 PM  Result Value Ref Range   Magnesium 1.8 1.7 - 2.4 mg/dL    Comment: Performed at [facility] [address], [place], [place] [address]  Potassium     Status: Abnormal   Collection Time: 04/22/22  6:14 PM  Result Value Ref Range   Potassium 5.7 (H) 3.5 - 5.1 mmol/L    Comment: Performed at [facility] [address] [place], [place] [address]  Ammonia     Status: Abnormal   Collection Time: 04/22/22  6:14 PM  Result Value Ref Range   Ammonia 43 (H) 9 - 35 umol/L    Comment: HEMOLYSIS AT THIS LEVEL MAY AFFECT RESULT Performed at [facility], [place] [address] [place],  [address]   I-Stat venous blood gas, ED     Status: Abnormal   Collection Time: 04/22/22  6:22 PM  Result Value Ref Range   pH, Ven 7.451 (H) 7.25 - 7.43   pCO2, Ven 75.0 (HH) 44 - 60 mmHg   pO2, Ven 43 32 - 45 mmHg   Bicarbonate 52.3 (H) 20.0 - 28.0 mmol/L   TCO2 >50 (H) 22 - 32 mmol/L   O2 Saturation 77 %   Acid-Base Excess 24.0 (H) 0.0 - 2.0 mmol/L   Sodium 137 135 - 145 mmol/L   Potassium 5.8 (H) 3.5 - 5.1 mmol/L   Calcium, Ion 1.09 (L) 1.15 - 1.40  mmol/L  HCT 33.0 (L) 36.0 - 46.0 %   Hemoglobin 11.2 (L) 12.0 - 15.0 g/dL   Sample type VENOUS    Comment NOTIFIED PHYSICIAN   CBG monitoring, ED     Status: Abnormal   Collection Time: 04/22/22  7:29 PM  Result Value Ref Range   Glucose-Capillary 441 (H) 70 - 99 mg/dL    Comment: Glucose reference range applies only to samples taken after fasting for at least 8 hours.   CT Head Wo Contrast  Result Date: 04/22/2022 CLINICAL DATA:  Altered mental status EXAM: CT HEAD WITHOUT CONTRAST TECHNIQUE: Contiguous axial images were obtained from the base of the skull through the vertex without intravenous contrast. RADIATION DOSE REDUCTION: This exam was performed according to the departmental dose-optimization program which includes automated exposure control, adjustment of the mA and/or kV according to patient size and/or use of iterative reconstruction technique. COMPARISON:  07/05/2021 FINDINGS: Brain: No acute intracranial findings are seen. There are no signs of bleeding within the cranium. Cortical sulci are prominent. There is decreased density in periventricular white matter. There is no focal edema or mass effect. Vascular: Unremarkable. Skull: Unremarkable. Sinuses/Orbits: There are no air-fluid levels or significant mucosal thickening in the sinuses. Orbits are unremarkable. There is fluid density in few of the left mastoid air cells. There is decrease in number of right mastoid air cells. 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 This finding has not changed significantly. Other: None. IMPRESSION: No acute intracranial findings are seen in noncontrast CT brain. Atrophy. Small-vessel disease. Left mastoid effusion. Electronically Signed   By: Elmer Picker M.D.   On: 04/22/2022 15:52   DG Chest Port 1 View  Result Date: 04/22/2022 CLINICAL DATA:  Weakness EXAM: PORTABLE CHEST 1 VIEW COMPARISON:  02/20/2022, chest CT 01/01/2022 FINDINGS: Right-sided  central venous port tip at the right atrium. Cardiomegaly with vascular congestion and probable mild pulmonary edema. Patchy airspace opacities in the upper lobes and left lung base. No pneumothorax. IMPRESSION: Cardiomegaly with vascular congestion and probable mild pulmonary edema. Patchy airspace disease in the upper lobes and left lung base may be due to edema or superimposed pneumonia. Electronically Signed   By: Donavan Foil M.D.   On: 04/22/2022 15:43    Pending Labs Unresulted Labs (From admission, onward)     Start     Ordered   04/23/22 8250  Basic metabolic panel  Daily,   R     Comments: As Scheduled for 5 days    04/22/22 1813   04/22/22 5397  Basic metabolic panel  Once-Timed,   STAT        04/22/22 1739   04/22/22 1737  Blood gas, venous  Once,   R        04/22/22 1737   04/22/22 1522  Urinalysis, Routine w reflex microscopic  ONCE - STAT,   URGENT        04/22/22 1521            Vitals/Pain Today's Vitals   04/22/22 1730 04/22/22 1930 04/22/22 1941 04/22/22 1941  BP: (!) 109/48 103/87    Pulse: 72 76    Resp: 18 19    Temp:   97.8 F (36.6 C)   TempSrc:   Oral   SpO2: 100% 100%  100%  PainSc:        Isolation Precautions No active isolations  Medications Medications  acetaminophen (TYLENOL) tablet 650 mg (has no administration in time range)  carvedilol (  COREG) tablet 3.125 mg (has no administration in time range)  furosemide (LASIX) injection 40 mg (has no administration in time range)  hydrALAZINE (APRESOLINE) injection 5 mg (has no administration in time range)  escitalopram (LEXAPRO) tablet 5 mg (has no administration in time range)  mirtazapine (REMERON) tablet 15 mg (has no administration in time range)  insulin glargine-yfgn (SEMGLEE) injection 10 Units (has no administration in time range)  albuterol (PROVENTIL) (2.5 MG/3ML) 0.083% nebulizer solution 3 mL (has no administration in time range)  fluticasone (FLONASE) 50 MCG/ACT nasal spray 1  spray (has no administration in time range)  montelukast (SINGULAIR) tablet 10 mg (has no administration in time range)  carboxymethylcellulose (REFRESH PLUS) 0.5 % ophthalmic solution 1 drop (has no administration in time range)  hydrocortisone 1 % ointment (has no administration in time range)  witch hazel-glycerin (TUCKS) pad 1 Application (has no administration in time range)  sodium chloride flush (NS) 0.9 % injection 3 mL (has no administration in time range)  sodium chloride flush (NS) 0.9 % injection 3 mL (has no administration in time range)  0.9 %  sodium chloride infusion (has no administration in time range)  ondansetron (ZOFRAN) injection 4 mg (has no administration in time range)  enoxaparin (LOVENOX) injection 40 mg (has no administration in time range)  insulin aspart (novoLOG) injection 0-20 Units (has no administration in time range)  polyvinyl alcohol (LIQUIFILM TEARS) 1.4 % ophthalmic solution 1 drop (has no administration in time range)  ipratropium-albuterol (DUONEB) 0.5-2.5 (3) MG/3ML nebulizer solution 3 mL (has no administration in time range)  budesonide (PULMICORT) nebulizer solution 0.25 mg (has no administration in time range)  linagliptin (TRADJENTA) tablet 5 mg (has no administration in time range)  gabapentin (NEURONTIN) capsule 100 mg (has no administration in time range)  furosemide (LASIX) injection 40 mg (40 mg Intravenous Given 04/22/22 1714)  insulin aspart (novoLOG) injection 10 Units (10 Units Intravenous Given 04/22/22 1720)  sodium bicarbonate injection 50 mEq (50 mEq Intravenous Given 04/22/22 1714)  calcium gluconate inj 10% (1 g) URGENT USE ONLY! (1 g Intravenous Given 04/22/22 1714)  sodium zirconium cyclosilicate (LOKELMA) packet 10 g (10 g Oral Given 04/22/22 1714)    Mobility non-ambulatory     Focused Assessments Neuro Assessment Handoff:  Swallow screen pass? No          Neuro Assessment:   Neuro Checks:      Has TPA been given?  No If patient is a Neuro Trauma and patient is going to OR before floor call report to [facility] nurse: [phone] or [phone]  , Pulmonary Assessment Handoff:  Lung sounds: Bilateral Breath Sounds: Diminished O2 Device: Nasal Cannula O2 Flow Rate (L/min): 3 L/min    R Recommendations: See Admitting Provider Note  Report given to:   Additional Notes: This RN took assumed care, patient is lethargic and alert only to self. Patient opens eyes to speech. Patient obeys commands. Patient is incontinent of urine and bowel.

## 2022-04-22 NOTE — Progress Notes (Signed)
Patient passed large amount of watery diarrhea, ED nurse suspect c diff infection given the foul smell. Nurse also reported that patient was just given low, less than 1 Norco and on last course diarrhea this past.  Will order CT if study and contact isolation.

## 2022-04-22 NOTE — Progress Notes (Signed)
Patient arrived from ED with a 418 glucose post 10u Semglee administration.

## 2022-04-22 NOTE — ED Notes (Signed)
Patient has large loose stool BM with foul odor. Incontinent care given, and linens changes. Will continue to monitor

## 2022-04-23 ENCOUNTER — Inpatient Hospital Stay (HOSPITAL_COMMUNITY): Payer: Medicare HMO

## 2022-04-23 DIAGNOSIS — E119 Type 2 diabetes mellitus without complications: Secondary | ICD-10-CM | POA: Diagnosis not present

## 2022-04-23 DIAGNOSIS — J9621 Acute and chronic respiratory failure with hypoxia: Secondary | ICD-10-CM

## 2022-04-23 DIAGNOSIS — C3490 Malignant neoplasm of unspecified part of unspecified bronchus or lung: Secondary | ICD-10-CM | POA: Diagnosis not present

## 2022-04-23 DIAGNOSIS — I10 Essential (primary) hypertension: Secondary | ICD-10-CM | POA: Diagnosis not present

## 2022-04-23 DIAGNOSIS — J9622 Acute and chronic respiratory failure with hypercapnia: Secondary | ICD-10-CM

## 2022-04-23 DIAGNOSIS — E669 Obesity, unspecified: Secondary | ICD-10-CM

## 2022-04-23 DIAGNOSIS — L8990 Pressure ulcer of unspecified site, unspecified stage: Secondary | ICD-10-CM | POA: Diagnosis present

## 2022-04-23 DIAGNOSIS — I5021 Acute systolic (congestive) heart failure: Secondary | ICD-10-CM | POA: Diagnosis not present

## 2022-04-23 DIAGNOSIS — J9601 Acute respiratory failure with hypoxia: Secondary | ICD-10-CM

## 2022-04-23 DIAGNOSIS — J9602 Acute respiratory failure with hypercapnia: Secondary | ICD-10-CM

## 2022-04-23 LAB — BLOOD GAS, VENOUS
Acid-Base Excess: 24 mmol/L — ABNORMAL HIGH (ref 0.0–2.0)
Acid-Base Excess: 29.5 mmol/L — ABNORMAL HIGH (ref 0.0–2.0)
Acid-Base Excess: 32.7 mmol/L — ABNORMAL HIGH (ref 0.0–2.0)
Acid-Base Excess: 34.5 mmol/L — ABNORMAL HIGH (ref 0.0–2.0)
Bicarbonate: 55 mmol/L — ABNORMAL HIGH (ref 20.0–28.0)
Bicarbonate: 60.4 mmol/L — ABNORMAL HIGH (ref 20.0–28.0)
Bicarbonate: 62.2 mmol/L — ABNORMAL HIGH (ref 20.0–28.0)
Bicarbonate: 66 mmol/L — ABNORMAL HIGH (ref 20.0–28.0)
Drawn by: 3409
O2 Saturation: 73 %
O2 Saturation: 73.8 %
O2 Saturation: 79.8 %
O2 Saturation: 99.4 %
Patient temperature: 36.9
Patient temperature: 37
Patient temperature: 37
Patient temperature: 37
pCO2, Ven: 78 mmHg (ref 44–60)
pCO2, Ven: 91 mmHg (ref 44–60)
pCO2, Ven: 93 mmHg (ref 44–60)
pCO2, Ven: 95 mmHg (ref 44–60)
pH, Ven: 7.38 (ref 7.25–7.43)
pH, Ven: 7.43 (ref 7.25–7.43)
pH, Ven: 7.45 — ABNORMAL HIGH (ref 7.25–7.43)
pH, Ven: 7.51 — ABNORMAL HIGH (ref 7.25–7.43)
pO2, Ven: 128 mmHg — ABNORMAL HIGH (ref 32–45)
pO2, Ven: 40 mmHg (ref 32–45)
pO2, Ven: 41 mmHg (ref 32–45)
pO2, Ven: 47 mmHg — ABNORMAL HIGH (ref 32–45)

## 2022-04-23 LAB — GLUCOSE, CAPILLARY
Glucose-Capillary: 101 mg/dL — ABNORMAL HIGH (ref 70–99)
Glucose-Capillary: 116 mg/dL — ABNORMAL HIGH (ref 70–99)
Glucose-Capillary: 124 mg/dL — ABNORMAL HIGH (ref 70–99)
Glucose-Capillary: 165 mg/dL — ABNORMAL HIGH (ref 70–99)
Glucose-Capillary: 266 mg/dL — ABNORMAL HIGH (ref 70–99)
Glucose-Capillary: 397 mg/dL — ABNORMAL HIGH (ref 70–99)

## 2022-04-23 LAB — BASIC METABOLIC PANEL
BUN: 7 mg/dL — ABNORMAL LOW (ref 8–23)
CO2: >45 mmol/L — ABNORMAL HIGH (ref 22–32)
Calcium: 9 mg/dL (ref 8.9–10.3)
Chloride: 85 mmol/L — ABNORMAL LOW (ref 98–111)
Creatinine, Ser: 1.02 mg/dL — ABNORMAL HIGH (ref 0.44–1.00)
GFR, Estimated: 57 mL/min — ABNORMAL LOW (ref >60)
Glucose, Bld: 395 mg/dL — ABNORMAL HIGH (ref 70–99)
Potassium: 4.5 mmol/L (ref 3.5–5.1)
Sodium: 142 mmol/L (ref 135–145)

## 2022-04-23 LAB — ECHOCARDIOGRAM COMPLETE
AR max vel: 2.64 cm2
AV Area VTI: 2.51 cm2
AV Area mean vel: 2.51 cm2
AV Mean grad: 2 mmHg
AV Peak grad: 4.5 mmHg
Ao pk vel: 1.06 m/s
Height: 65 in
S' Lateral: 4.7 cm
Weight: 3192.26 oz

## 2022-04-23 LAB — C DIFFICILE QUICK SCREEN W PCR REFLEX
C Diff antigen: NEGATIVE
C Diff interpretation: NOT DETECTED
C Diff toxin: NEGATIVE

## 2022-04-23 MED ORDER — GERHARDT'S BUTT CREAM
TOPICAL_CREAM | Freq: Three times a day (TID) | CUTANEOUS | Status: DC
  Filled 2022-04-23 (×2): qty 1

## 2022-04-23 MED ORDER — ACETAZOLAMIDE 250 MG PO TABS
250.0000 mg | ORAL_TABLET | Freq: Two times a day (BID) | ORAL | Status: DC
  Administered 2022-04-24 – 2022-04-27 (×8): 250 mg via ORAL
  Filled 2022-04-23 (×8): qty 1

## 2022-04-23 MED ORDER — METHYLPREDNISOLONE SODIUM SUCC 40 MG IJ SOLR
40.0000 mg | INTRAMUSCULAR | Status: DC
  Administered 2022-04-23: 40 mg via INTRAVENOUS
  Filled 2022-04-23: qty 1

## 2022-04-23 MED ORDER — MOMETASONE FURO-FORMOTEROL FUM 200-5 MCG/ACT IN AERO
2.0000 | INHALATION_SPRAY | Freq: Two times a day (BID) | RESPIRATORY_TRACT | Status: DC
  Administered 2022-04-24 – 2022-04-29 (×11): 2 via RESPIRATORY_TRACT
  Filled 2022-04-23: qty 8.8

## 2022-04-23 MED ORDER — NITROGLYCERIN 0.4 MG SL SUBL
SUBLINGUAL_TABLET | SUBLINGUAL | Status: AC
  Administered 2022-04-23: 0.4 mg
  Filled 2022-04-23: qty 1

## 2022-04-23 MED ORDER — PERFLUTREN LIPID MICROSPHERE
1.0000 mL | INTRAVENOUS | Status: AC | PRN
  Administered 2022-04-23: 3 mL via INTRAVENOUS

## 2022-04-23 MED ORDER — IPRATROPIUM-ALBUTEROL 0.5-2.5 (3) MG/3ML IN SOLN
3.0000 mL | RESPIRATORY_TRACT | Status: DC | PRN
  Filled 2022-04-23: qty 3

## 2022-04-23 NOTE — Consult Note (Addendum)
WOC Nurse Consult Note: Reason for Consult: Consult requested for buttock wounds.  Pt is frequently incontinent of urine and a Purewick has been placed to attempt to contain the urine.  There are 2 areas of partial thickness wounds to inner buttocks, each is .5X.5X.1cm, red and moist in a "kissing" position which means the same location bilat, near the gluteal fold where moisture is constantly trapped.  They are not in a location for pressure injury occurrence.   ICD-10 CM Codes for Irritant Dermatitis L24A2 - Due to fecal, urinary or dual incontinence  Dressing procedure/placement/frequency: Topical treatment orders provided for bedside nurses to perform as follows to repel moisture and promote healing: Apply Gerhardts cream to bilat buttocks TID and PRN when turning or cleaning. Please re-consult if further assistance is needed.  Thank-you,  Cammie Mcgee MSN, RN, CWOCN, [place], CNS [phone]

## 2022-04-23 NOTE — Assessment & Plan Note (Signed)
Continue blood pressure control with carvedilol.

## 2022-04-23 NOTE — Plan of Care (Signed)

## 2022-04-23 NOTE — Progress Notes (Signed)
Heart Failure Navigator Progress Note  Assessed for Heart & Vascular TOC clinic readiness.  Patient does not meet criteria due to Advanced Heart Failure Team patient.   Navigator will sign off at this time.    [place] Moyd, BSN, RN Heart Failure Nurse Navigator Secure Chat Only   

## 2022-04-23 NOTE — Progress Notes (Signed)
Based on VBG, increased RR to 22

## 2022-04-23 NOTE — Assessment & Plan Note (Signed)
Follow up as outpatient.  

## 2022-04-23 NOTE — Progress Notes (Signed)
Echo attempted. Patient care in progress. Will attempt again as schedule permits.

## 2022-04-23 NOTE — Progress Notes (Signed)
Pt VBG showed elevated PCO2, place pt onb Bipap

## 2022-04-23 NOTE — Assessment & Plan Note (Signed)
Continue local skin care.

## 2022-04-23 NOTE — Assessment & Plan Note (Addendum)
Acute on chronic respiratory failure.  COPD exacerbation Obesity hypoventilation syndrome.   Patient used bipap last night with further improvement in her mentation.  Continue with acetazolamide.   Continue bronchodilatory, and airway clearing techniques Inhaled corticosteroids. Dc methylprednisolone.   Out of bed to chair tid with meals Patient will benefit from Bipap at home.   Acute metabolic encephalopathy, multifactorial, including respiratory failure.  Hold all psychoactive medications for now.  Continue neuro checks.  Mentation is improving but not yet back to baseline.

## 2022-04-23 NOTE — Progress Notes (Signed)
Dr. Antionette Char bedside to examine patient.  Agreed with rate change to 22 and requests VBG at 10am.  New order for VBG placed and timed for 10am.

## 2022-04-23 NOTE — Progress Notes (Signed)
Progress Note   Patient: Charlene Houston YWS:[id number] DOB: 10/26/1945 DOA: 04/22/2022     1 DOS: the patient was seen and examined on 04/23/2022   Brief hospital course: Mrs. Kuehne was admitted to the hospital with the working diagnosis of acute metabolic encephalopathy, acute on chronic hypercapnic respiratory failure.   77 yo female with the past medical history with heart failure, chronic respiratory failure, hypoxemic, hypertension, non small cell lung cancer. At home patient very confused and disorientated, generalized weakness and somnolence. As outpatient evaluation primidone was discontinue and was supposed to start topamax. On the day of admission she was found unresponsive and was transferred to the ED. On her initial physical examination her blood pressure was 98/48, HR 68, RR 18 and 02 saturation 98%, lungs with rales and increase work of breathing, abdomen with no distention, left buttock stage 2 pressure ulcer, no lower extremity edema.   Na 138, K 6,3 CL 87, bicarbonate 41, glucose 532, BUN 10, cr 1,0 AST 56, ALT 36  BNP 1,996  Wbc 12.8 hgb 9,7 plt 159  Sars covid 19 negative   VBG 7,41 75, 43, >50,   Head CT with no acute changes.   Chest radiograph with cardiomegaly, bilateral hilar vascular congestion. Positive port tip at the right atrium.   EKG 69 bpm, left axis deviation, interventricular conduction delay, sinus rhythm, poor R R wave progression, with no significant ST segment or T wave changes.     Assessment and Plan: Respiratory failure with hypoxia and hypercapnia (HCC) Acute on chronic respiratory failure.  COPD exacerbation Obesity hypoventilation syndrome.   Patient has been on bipap with improvement of her symptoms. Follow up VBG with pH 7,51 and Co2 78.   Plan to continue bipap at night Will add acetazolamide Continue bronchodilatory, and airway clearing techniques Inhaled and systemic corticosteroids.  Out of bed to chair tid with  meals Patient will benefit from Bipap at home.   Acute metabolic encephalopathy, multifactorial, including respiratory failure.  Hold all psychoactive medications for now.  Continue neuro checks.   CHF (congestive heart failure) (HCC) Diastolic heart failure with no clinical signs of exacerbation.  Continue blood pressure monitoring.  Hold on furosemide for now, avoid further alkalosis from contraction.   Essential hypertension Continue blood pressure control with carvedilol.   Type 2 diabetes mellitus (HCC) Continue glucose cover and monitoring with insulin sliding scale.  Continue basal insulin.    Pressure injury of skin Continue local skin care.   Non-small cell lung cancer (HCC) Follow up as outpatient   Class 1 obesity Calculated BMI is 33,2  OSA        Subjective: Patient has been on bipap at the time of my examination she is more awake and alert.   Physical Exam: Vitals:   04/23/22 1456 04/23/22 1502 04/23/22 1510 04/23/22 1700  BP: (!) 108/55 99/65 (!) 103/57   Pulse: 78 78 78   Resp: 18 16 18    Temp:    98.5 F (36.9 C)  TempSrc:    Oral  SpO2: 98%  100%   Weight:      Height:       Neurology awake and alert ENT with no pallor Cardiovascular with S1 and S2 present and rhythmic  Respiratory with mild wheezing, prolonged expiratory phase and distant breath sounds Abdomen with no distention Trace non pitting lower extremity edema  Data Reviewed:    Family Communication: I spoke with patient's daughter at the bedside, we talked in detail  about patient's condition, plan of care and prognosis and all questions were addressed.   Disposition: Status is: Inpatient Remains inpatient appropriate because: respiratory support with bipap  Planned Discharge Destination: Home      Author: Coralie Keens, MD 04/23/2022 5:59 PM  For on call review [email or web address].

## 2022-04-23 NOTE — Progress Notes (Signed)
PCO2 increased from 93 to 95.  Paged Physician and called Respiratory.  Respiratory advised a rate change from 18 to 22.  Rate changed to 22.

## 2022-04-23 NOTE — Progress Notes (Signed)
Notified that CO2 on VBG has increased despite BiPAP.   Patient was seen and case discussed with RN at bedside. Her mental status has actually improved overnight and she is answering more questions appropriately now. BiPAP adjustments were made and blood gas will be repeated later this am.

## 2022-04-23 NOTE — Assessment & Plan Note (Signed)
Echocardiogram with depressed LV systolic function EF 30 to 35%, global hypokinesis, mild LVH. RV systolic function with preserved. No significant valvular disease.   Systolic blood pressure 113 to 103 mmHg.   Diuresis with acetazolamide, furosemide 20 mg and spironolactone.  Continue with carvedilol and resume losartan.

## 2022-04-23 NOTE — Assessment & Plan Note (Addendum)
Calculated BMI is 33,2  OSA

## 2022-04-23 NOTE — Assessment & Plan Note (Signed)
Hyperglycemia.  Fasting glucose is 101.  Continue glucose cover and monitoring with insulin sliding scale.  Continue basal insulin.  Systemic steroids have been discontinued.

## 2022-04-23 NOTE — Hospital Course (Addendum)
Charlene Houston was admitted to the hospital with the working diagnosis of acute metabolic encephalopathy, acute on chronic hypercapnic respiratory failure.   77 yo female with the past medical history with heart failure, chronic respiratory failure, hypoxemic, hypertension, non small cell lung cancer. At home patient very confused and disorientated, generalized weakness and somnolence. As outpatient evaluation primidone was discontinue and was supposed to start topamax. On the day of admission she was found unresponsive and was transferred to the ED. On her initial physical examination her blood pressure was 98/48, HR 68, RR 18 and 02 saturation 98%, lungs with rales and increase work of breathing, abdomen with no distention, left buttock stage 2 pressure ulcer, no lower extremity edema.   Na 138, K 6,3 CL 87, bicarbonate 41, glucose 532, BUN 10, cr 1,0 AST 56, ALT 36  BNP 1,996  Wbc 12.8 hgb 9,7 plt 159  Sars covid 19 negative   VBG 7,41 75, 43, >50,   Head CT with no acute changes.   Chest radiograph with cardiomegaly, bilateral hilar vascular congestion. Positive port tip at the right atrium.   EKG 69 bpm, left axis deviation, interventricular conduction delay, sinus rhythm, poor R R wave progression, with no significant ST segment or T wave changes.   01/13 patient clinically improving with Bipap, not using it at night.  Will need home Bipap. Follow up with PT and OT.   01/14 mentation is improving.  01/15 patient continue to improve, but very weak and deconditioned, may need SNF.  01/16 Patient is medically stable to transfer to SNF, pending placement. Will need to continue with Bipap to use at night.

## 2022-04-24 DIAGNOSIS — J9621 Acute and chronic respiratory failure with hypoxia: Secondary | ICD-10-CM | POA: Diagnosis not present

## 2022-04-24 DIAGNOSIS — I5023 Acute on chronic systolic (congestive) heart failure: Secondary | ICD-10-CM | POA: Diagnosis not present

## 2022-04-24 DIAGNOSIS — I10 Essential (primary) hypertension: Secondary | ICD-10-CM | POA: Diagnosis not present

## 2022-04-24 DIAGNOSIS — E119 Type 2 diabetes mellitus without complications: Secondary | ICD-10-CM | POA: Diagnosis not present

## 2022-04-24 LAB — BASIC METABOLIC PANEL
BUN: 9 mg/dL (ref 8–23)
CO2: >45 mmol/L — ABNORMAL HIGH (ref 22–32)
Calcium: 9 mg/dL (ref 8.9–10.3)
Chloride: 85 mmol/L — ABNORMAL LOW (ref 98–111)
Creatinine, Ser: 0.77 mg/dL (ref 0.44–1.00)
GFR, Estimated: >60 mL/min (ref >60)
Glucose, Bld: 125 mg/dL — ABNORMAL HIGH (ref 70–99)
Potassium: 3.7 mmol/L (ref 3.5–5.1)
Sodium: 145 mmol/L (ref 135–145)

## 2022-04-24 LAB — GLUCOSE, CAPILLARY
Glucose-Capillary: 193 mg/dL — ABNORMAL HIGH (ref 70–99)
Glucose-Capillary: 252 mg/dL — ABNORMAL HIGH (ref 70–99)
Glucose-Capillary: 239 mg/dL — ABNORMAL HIGH (ref 70–99)
Glucose-Capillary: 273 mg/dL — ABNORMAL HIGH (ref 70–99)

## 2022-04-24 MED ORDER — FUROSEMIDE 20 MG PO TABS
20.0000 mg | ORAL_TABLET | Freq: Every day | ORAL | Status: DC
  Administered 2022-04-25 – 2022-04-29 (×5): 20 mg via ORAL
  Filled 2022-04-24 (×5): qty 1

## 2022-04-24 MED ORDER — SPIRONOLACTONE 25 MG PO TABS
25.0000 mg | ORAL_TABLET | Freq: Every day | ORAL | Status: DC
  Administered 2022-04-24 – 2022-04-29 (×6): 25 mg via ORAL
  Filled 2022-04-24 (×7): qty 1

## 2022-04-24 MED ORDER — SPIRONOLACTONE 12.5 MG HALF TABLET: 12.5000 mg | ORAL_TABLET | Freq: Every day | ORAL | Status: DC

## 2022-04-24 MED ORDER — IPRATROPIUM-ALBUTEROL 0.5-2.5 (3) MG/3ML IN SOLN
3.0000 mL | Freq: Two times a day (BID) | RESPIRATORY_TRACT | Status: DC
  Administered 2022-04-24 – 2022-04-29 (×10): 3 mL via RESPIRATORY_TRACT
  Filled 2022-04-24 (×10): qty 3

## 2022-04-24 MED ORDER — FUROSEMIDE 40 MG PO TABS: 40.0000 mg | ORAL_TABLET | Freq: Every day | ORAL | Status: DC

## 2022-04-24 NOTE — Progress Notes (Signed)
Progress Note   Patient: Charlene Houston CCV:[id number] DOB: Mar 20, 1946 DOA: 04/22/2022     2 DOS: the patient was seen and examined on 04/24/2022   Brief hospital course: Charlene Houston was admitted to the hospital with the working diagnosis of acute metabolic encephalopathy, acute on chronic hypercapnic respiratory failure.   77 yo female with the past medical history with heart failure, chronic respiratory failure, hypoxemic, hypertension, non small cell lung cancer. At home patient very confused and disorientated, generalized weakness and somnolence. As outpatient evaluation primidone was discontinue and was supposed to start topamax. On the day of admission she was found unresponsive and was transferred to the ED. On her initial physical examination her blood pressure was 98/48, HR 68, RR 18 and 02 saturation 98%, lungs with rales and increase work of breathing, abdomen with no distention, left buttock stage 2 pressure ulcer, no lower extremity edema.   Na 138, K 6,3 CL 87, bicarbonate 41, glucose 532, BUN 10, cr 1,0 AST 56, ALT 36  BNP 1,996  Wbc 12.8 hgb 9,7 plt 159  Sars covid 19 negative   VBG 7,41 75, 43, >50,   Head CT with no acute changes.   Chest radiograph with cardiomegaly, bilateral hilar vascular congestion. Positive port tip at the right atrium.   EKG 69 bpm, left axis deviation, interventricular conduction delay, sinus rhythm, poor R R wave progression, with no significant ST segment or T wave changes.   01/13 patient clinically improving with Bipap, not using it at night.  Will need home Bipap. Follow up with PT and OT.   Assessment and Plan: Respiratory failure with hypoxia and hypercapnia (HCC) Acute on chronic respiratory failure.  COPD exacerbation Obesity hypoventilation syndrome.   Patient used bipap last night with further improvement in her mentation.  Continue with acetazolamide.   Continue bronchodilatory, and airway clearing techniques Inhaled  corticosteroids. Dc methylprednisolone.   Out of bed to chair tid with meals Patient will benefit from Bipap at home.   Acute metabolic encephalopathy, multifactorial, including respiratory failure.  Hold all psychoactive medications for now.  Continue neuro checks.  Mentation is improving but not yet back to baseline.   Acute on chronic systolic CHF (congestive heart failure) (HCC) Diastolic heart failure with no clinical signs of exacerbation.  Continue blood pressure monitoring.  Continue to hold on furosemide for now, avoid further alkalosis from contraction.  Echocardiogram with depressed LV systolic function EF 30 to 35%, global hypokinesis, mild LVH. RV systolic function with preserved. No significant valvular disease.   Systolic blood pressure 110 to 126 mmHg.   Continue diuresis with acetazolamide and resume furosemide 20 mg daily  Add spironolactone.  Continue with carvedilol, if blood pressure stable will add ARB.   Essential hypertension Continue blood pressure control with carvedilol.   Type 2 diabetes mellitus (HCC) Continue glucose cover and monitoring with insulin sliding scale.  Continue basal insulin.    Pressure injury of skin Continue local skin care.   Non-small cell lung cancer (HCC) Follow up as outpatient   Class 1 obesity Calculated BMI is 33,2  OSA        Subjective: Patient is more awake and alert, no chest pain, dyspnea has been improving. Her daughter at the bedside confirms improvement in mentation but not back to baseline   Physical Exam: Vitals:   04/24/22 0820 04/24/22 0952 04/24/22 1033 04/24/22 1536  BP:  126/66 129/71 (!) 104/54  Pulse: 82 89 83 78  Resp: 16  18  20  Temp:   99.1 F (37.3 C) 97.9 F (36.6 C)  TempSrc:   Oral Oral  SpO2: 100%  98% 100%  Weight:      Height:       Neurology awake and alert ENT with mild pallor Cardiovascular with S1 and S2 present and rhythmic with no gallops or murmurs Respiratory with  distant breath sounds and poor inspiratory effort. No wheezing, rales or rhonchi on anterior auscultation  Abdomen with no distention  No lower extremity edema  Data Reviewed:    Family Communication: I spoke with patient's daughter at the bedside, we talked in detail about patient's condition, plan of care and prognosis and all questions were addressed.   Disposition: Status is: Inpatient Remains inpatient appropriate because: respiratory failure   Planned Discharge Destination: Home  Author: Coralie Keens, MD 04/24/2022 3:42 PM  For on call review [email or web address].

## 2022-04-24 NOTE — Progress Notes (Deleted)
Pt extremely restless most of the evening and calling out. Tolerated Bipap for approx 4 hours. When attempting to replace Bipap pt RR increased into the 40s and she became distraught repeating "no, take it off". Around 0200 pt removed all monitoring including IV. Pt seemed more awake and asked for water. After drinking a small amount pt began coughing, gagging, and then vomited. O2 was increased and this RN helped to provide oral suction. Pt recovered and was returned to baseline O2 levels.

## 2022-04-24 NOTE — Progress Notes (Signed)
Pt extremely restless and calling out most of the evening. Tolerated Bipap for approx 4hrs. When attempting to replace Bipap pts RR increased into the 40s and she appeared distraught repeating,"no, take it off."  Around 0200 pt appeared more awake and was asking for water. After drinking a small sip pt began coughing, gagging, then vomited. O2 was increased and this RN provided oral suction. Pt recovered and O2 was returned to baseline. No other PO intake was attempted due to poor response. Mouth swabs provided with supervision and suction as needed.

## 2022-04-24 NOTE — Evaluation (Signed)
Physical Therapy Evaluation Patient Details Name: Charlene Houston MRN: [id number] DOB: 1945-05-21 Today's Date: 04/24/2022  History of Present Illness  Pt is a 77 y.o. female admitted 04/22/22 after being found unresponsive. Workup for acute metabolic encephalopathy, acute on chronic hypercapnic respiratory, COPD exacerbation. Head CT negative for acute injury. Pt also with L buttock stage 2 pressure ulcer. PMH includes lung CA, breast CA, HTN, OSA, DM, arthritis, obesity, CHF, COPD.   Clinical Impression  Pt presents with an overall decrease in functional mobility secondary to above. PTA, pt reports mod indep with rollator, lives with daughter since admission 12/2021. Today, pt requiring minA for bed mobility, standing and step pivot transfer to recliner; pt moves fairly well, though limited by cognition, requiring max cues for safety and sequencing of tasks. Pt with inconsistent reports of assist family able to provide; if they are unable to assist, pt may require post-acute rehab services. Pt would benefit from continued acute PT services to maximize functional mobility and independence prior to d/c home.      Recommendations for follow up therapy are one component of a multi-disciplinary discharge planning process, led by the attending physician.  Recommendations may be updated based on patient status, additional functional criteria and insurance authorization.  Follow Up Recommendations Home health PT (if family able to provide necessary assist)      Assistance Recommended at Discharge Frequent or constant Supervision/Assistance  Patient can return home with the following  A little help with walking and/or transfers;A little help with bathing/dressing/bathroom;Assistance with cooking/housework;Direct supervision/assist for medications management;Direct supervision/assist for financial management;Assist for transportation;Help with stairs or ramp for entrance    Equipment Recommendations   (TBD)  Recommendations for Other Services  Occupational Therapy     Functional Status Assessment Patient has had a recent decline in their functional status and demonstrates the ability to make significant improvements in function in a reasonable and predictable amount of time.     Precautions / Restrictions Precautions Precautions: Fall;Other (comment) Precaution Comments: watch SpO2 (wears 2L O2 baseline) Restrictions Weight Bearing Restrictions: No      Mobility  Bed Mobility Overal bed mobility: Needs Assistance Bed Mobility: Supine to Sit     Supine to sit: Min assist, HOB elevated     General bed mobility comments: repeated cues to stay on task as pt stopping frequently, cues for sequencing, minA for RLE management and trunk elevation; pt 'giving up' stating "I can't" requiring max encouragement to keep trying    Transfers Overall transfer level: Needs assistance Equipment used: 1 person hand held assist Transfers: Sit to/from Stand, Bed to chair/wheelchair/BSC Sit to Stand: Min assist   Step pivot transfers: Min assist       General transfer comment: pt trying multiple ways to stand requiring max cues for safety, at times attempting to grab therapist for support; max education on safe technique, pt ultimately able to stand and step pivot from bed to recliner with minA for HHA to stabilize    Ambulation/Gait                  Stairs            Wheelchair Mobility    Modified Rankin (Stroke Patients Only)       Balance Overall balance assessment: Needs assistance Sitting-balance support: No upper extremity supported, Feet supported Sitting balance-Leahy Scale: Good     Standing balance support: Single extremity supported, During functional activity Standing balance-Leahy Scale: Poor  Pertinent Vitals/Pain Pain Assessment Pain Assessment: No/denies pain    Home Living Family/patient expects to  be discharged to:: Private residence Living Arrangements: Children Available Help at Discharge: Family;Available PRN/intermittently Type of Home: House Home Access: Stairs to enter Entrance Stairs-Rails: None Entrance Stairs-Number of Steps: 2-3 Alternate Level Stairs-Number of Steps: flight Home Layout: Two level;Bed/bath upstairs;Able to live on main level with bedroom/bathroom Home Equipment: Rollator (4 wheels) Additional Comments: since admission 12/2021, has been living at daughter's home on main level but wants to move upstairs "since my computer is up there"    Prior Function Prior Level of Function : Independent/Modified Independent;Driving             Mobility Comments: pt reports mod indep with rollator, but has not done stairs ADLs Comments: pt reports indep with ADL/iADLs, unsure reliable historian     Hand Dominance        Extremity/Trunk Assessment   Upper Extremity Assessment Upper Extremity Assessment: Generalized weakness    Lower Extremity Assessment Lower Extremity Assessment: Generalized weakness       Communication   Communication: Expressive difficulties  Cognition Arousal/Alertness: Awake/alert Behavior During Therapy: WFL for tasks assessed/performed, Anxious Overall Cognitive Status: No family/caregiver present to determine baseline cognitive functioning                                 General Comments: pt anxious and repeats "I can't" requiring max encouragement and cues for calming strategies with mobility. pt often repeats back what therapist says; max cues for safety as pt adamant about reaching out to hold therapist for transfer though not safe. after transfer, pt repeating, "I love you I love you"        General Comments General comments (skin integrity, edema, etc.): session performed on 2L O2 [place]    Exercises     Assessment/Plan    PT Assessment Patient needs continued PT services  PT Problem List Decreased  strength;Decreased activity tolerance;Decreased balance;Decreased mobility;Decreased cognition;Decreased knowledge of use of DME;Decreased safety awareness;Cardiopulmonary status limiting activity       PT Treatment Interventions DME instruction;Gait training;Stair training;Functional mobility training;Therapeutic activities;Therapeutic exercise;Balance training;Neuromuscular re-education;Cognitive remediation;Patient/family education    PT Goals (Current goals can be found in the Care Plan section)  Acute Rehab PT Goals Patient Stated Goal: return home, be able to climb stairs to bedroom PT Goal Formulation: With patient Time For Goal Achievement: 05/08/22 Potential to Achieve Goals: Good    Frequency Min 3X/week     Co-evaluation               AM-PAC PT "6 Clicks" Mobility  Outcome Measure Help needed turning from your back to your side while in a flat bed without using bedrails?: A Little Help needed moving from lying on your back to sitting on the side of a flat bed without using bedrails?: A Little Help needed moving to and from a bed to a chair (including a wheelchair)?: A Little Help needed standing up from a chair using your arms (e.g., wheelchair or bedside chair)?: A Little Help needed to walk in hospital room?: Total Help needed climbing 3-5 steps with a railing? : A Lot 6 Click Score: 15    End of Session Equipment Utilized During Treatment: Gait belt;Oxygen Activity Tolerance: Patient tolerated treatment well Patient left: in chair;with call bell/phone within reach;with chair alarm set Nurse Communication: Mobility status PT Visit Diagnosis: Other abnormalities of gait and  mobility (R26.89)    Time: 3570-1779 PT Time Calculation (min) (ACUTE ONLY): 22 min   Charges:   PT Evaluation $PT Eval Moderate Complexity: 1 Mod        Ina Homes, PT, DPT Acute Rehabilitation Services  Personal: Secure Chat Rehab Office: [phone]  Malachy Chamber 04/24/2022, 10:08 AM

## 2022-04-24 NOTE — Evaluation (Signed)
Occupational Therapy Evaluation Patient Details Name: Charlene Houston MRN: [id number] DOB: February 12, 1946 Today's Date: 04/24/2022   History of Present Illness Pt is a 77 y.o. female admitted 04/22/22 after being found unresponsive. Workup for acute metabolic encephalopathy, acute on chronic hypercapnic respiratory, COPD exacerbation. Head CT negative for acute injury. Pt also with L buttock stage 2 pressure ulcer. PMH includes lung CA, breast CA, HTN, OSA, DM, arthritis, obesity, CHF, COPD.   Clinical Impression   Pt questionable historian, reports needing assist from ADLs and uses rollator for mobility, lives with son and has daughter assist occasionally. Pt needing supervision-max A for ADLs, and min A for transfers. Pt oriented to self and place, stating it is 1948 when asked the year, easily distracted and needing frequent cues for redirection throughout session. Pt presenting with impairments listed below, will follow acutely. Recommend HHOT at d/c.     Recommendations for follow up therapy are one component of a multi-disciplinary discharge planning process, led by the attending physician.  Recommendations may be updated based on patient status, additional functional criteria and insurance authorization.   Follow Up Recommendations  Home health OT     Assistance Recommended at Discharge Frequent or constant Supervision/Assistance  Patient can return home with the following A little help with walking and/or transfers;A lot of help with bathing/dressing/bathroom;Assistance with cooking/housework;Direct supervision/assist for medications management;Direct supervision/assist for financial management;Assist for transportation;Help with stairs or ramp for entrance    Functional Status Assessment  Patient has had a recent decline in their functional status and demonstrates the ability to make significant improvements in function in a reasonable and predictable amount of time.  Equipment  Recommendations  Tub/shower seat    Recommendations for Other Services PT consult     Precautions / Restrictions Precautions Precautions: Fall;Other (comment) Precaution Comments: watch SpO2 (wears 2L O2 baseline reports increaseing O2 to 4-5L with activity at home) Restrictions Weight Bearing Restrictions: No      Mobility Bed Mobility               General bed mobility comments: OOB in chair upon arrival and departure    Transfers Overall transfer level: Needs assistance Equipment used: Rolling walker (2 wheels) Transfers: Sit to/from Stand Sit to Stand: Min assist     Step pivot transfers: Min assist            Balance     Sitting balance-Leahy Scale: Good       Standing balance-Leahy Scale: Poor                             ADL either performed or assessed with clinical judgement   ADL Overall ADL's : Needs assistance/impaired Eating/Feeding: Supervision/ safety   Grooming: Supervision/safety   Upper Body Bathing: Minimal assistance   Lower Body Bathing: Maximal assistance   Upper Body Dressing : Minimal assistance   Lower Body Dressing: Maximal assistance   Toilet Transfer: Min guard;Ambulation;BSC/3in1;Rolling walker (2 wheels);Minimal assistance   Toileting- Clothing Manipulation and Hygiene: Maximal assistance       Functional mobility during ADLs: Min guard;Rolling walker (2 wheels)       Vision   Vision Assessment?: No apparent visual deficits     Perception Perception Perception Tested?: No   Praxis Praxis Praxis tested?: Not tested    Pertinent Vitals/Pain Pain Assessment Pain Assessment: No/denies pain     Hand Dominance Right   Extremity/Trunk Assessment Upper Extremity Assessment Upper Extremity Assessment:  Generalized weakness   Lower Extremity Assessment Lower Extremity Assessment: Defer to PT evaluation   Cervical / Trunk Assessment Cervical / Trunk Assessment: Normal   Communication  Communication Communication: Expressive difficulties   Cognition Arousal/Alertness: Awake/alert Behavior During Therapy: WFL for tasks assessed/performed, Anxious Overall Cognitive Status: No family/caregiver present to determine baseline cognitive functioning                                 General Comments: pt needing frequent redirection, easily distracted and perseverative on naming colors of items in room     General Comments  SpO2 dropping to 88% on 2L O2 during session with 2/4 DOE    Exercises     Shoulder Instructions      Home Living Family/patient expects to be discharged to:: Private residence Living Arrangements: Children Available Help at Discharge: Family;Available PRN/intermittently Type of Home: House Home Access: Stairs to enter [employer] of Steps: 2-3 Entrance Stairs-Rails: None Home Layout: Two level;Bed/bath upstairs;Able to live on main level with bedroom/bathroom Alternate Level Stairs-Number of Steps: flight Alternate Level Stairs-Rails: Right Bathroom Shower/Tub: Tub/shower unit   Bathroom Toilet: Standard     Home Equipment: Rollator (4 wheels)   Additional Comments: since admission 12/2021, has been living at daughter's home on main level but wants to move upstairs "since my computer is up there"      Prior Functioning/Environment Prior Level of Function : Independent/Modified Independent;Driving             Mobility Comments: pt reports mod indep with rollator, but has not done stairs ADLs Comments: reports needing assist for LB ADL        OT Problem List: Decreased strength;Decreased range of motion;Decreased activity tolerance;Impaired balance (sitting and/or standing);Decreased safety awareness;Decreased cognition      OT Treatment/Interventions: Self-care/ADL training;Therapeutic exercise;DME and/or AE instruction;Energy conservation;Therapeutic activities;Patient/family education;Balance training     OT Goals(Current goals can be found in the care plan section) Acute Rehab OT Goals Patient Stated Goal: none stated OT Goal Formulation: With patient Time For Goal Achievement: 05/08/22 Potential to Achieve Goals: Good ADL Goals Pt Will Perform Upper Body Dressing: with supervision;standing;sitting Pt Will Perform Lower Body Dressing: with supervision;sitting/lateral leans;sit to/from stand Pt Will Transfer to Toilet: with supervision;ambulating;regular height toilet  OT Frequency: Min 2X/week    Co-evaluation              AM-PAC OT "6 Clicks" Daily Activity     Outcome Measure Help from another person eating meals?: None Help from another person taking care of personal grooming?: A Little Help from another person toileting, which includes using toliet, bedpan, or urinal?: A Lot Help from another person bathing (including washing, rinsing, drying)?: A Lot Help from another person to put on and taking off regular upper body clothing?: A Lot Help from another person to put on and taking off regular lower body clothing?: A Lot 6 Click Score: 15   End of Session Equipment Utilized During Treatment: Gait belt;Rolling walker (2 wheels);Oxygen Nurse Communication: Mobility status  Activity Tolerance: Patient tolerated treatment well Patient left: in chair;with call bell/phone within reach;with chair alarm set  OT Visit Diagnosis: Unsteadiness on feet (R26.81);Other abnormalities of gait and mobility (R26.89);Muscle weakness (generalized) (M62.81)                Time: 1610-9604 OT Time Calculation (min): 20 min Charges:  OT General Charges $OT Visit: 1 Visit OT Evaluation $  OT Eval Moderate Complexity: 1 Mod  Torez Beauregard K, OTD, OTR/L [facility] Rehab [phone]  Dalphine Handing 04/24/2022, 12:05 PM

## 2022-04-25 DIAGNOSIS — I10 Essential (primary) hypertension: Secondary | ICD-10-CM | POA: Diagnosis not present

## 2022-04-25 DIAGNOSIS — E876 Hypokalemia: Secondary | ICD-10-CM

## 2022-04-25 DIAGNOSIS — J9621 Acute and chronic respiratory failure with hypoxia: Secondary | ICD-10-CM | POA: Diagnosis not present

## 2022-04-25 DIAGNOSIS — I5023 Acute on chronic systolic (congestive) heart failure: Secondary | ICD-10-CM | POA: Diagnosis not present

## 2022-04-25 LAB — GLUCOSE, CAPILLARY
Glucose-Capillary: 248 mg/dL — ABNORMAL HIGH (ref 70–99)
Glucose-Capillary: 350 mg/dL — ABNORMAL HIGH (ref 70–99)
Glucose-Capillary: 433 mg/dL — ABNORMAL HIGH (ref 70–99)
Glucose-Capillary: 218 mg/dL — ABNORMAL HIGH (ref 70–99)

## 2022-04-25 LAB — BASIC METABOLIC PANEL
Anion gap: 12 (ref 5–15)
Anion gap: 10 (ref 5–15)
BUN: 10 mg/dL (ref 8–23)
BUN: 8 mg/dL (ref 8–23)
CO2: 42 mmol/L — ABNORMAL HIGH (ref 22–32)
CO2: 40 mmol/L — ABNORMAL HIGH (ref 22–32)
Calcium: 8.3 mg/dL — ABNORMAL LOW (ref 8.9–10.3)
Calcium: 8.2 mg/dL — ABNORMAL LOW (ref 8.9–10.3)
Chloride: 81 mmol/L — ABNORMAL LOW (ref 98–111)
Chloride: 85 mmol/L — ABNORMAL LOW (ref 98–111)
Creatinine, Ser: 0.83 mg/dL (ref 0.44–1.00)
Creatinine, Ser: 0.76 mg/dL (ref 0.44–1.00)
GFR, Estimated: >60 mL/min (ref >60)
GFR, Estimated: >60 mL/min (ref >60)
Glucose, Bld: 271 mg/dL — ABNORMAL HIGH (ref 70–99)
Glucose, Bld: 257 mg/dL — ABNORMAL HIGH (ref 70–99)
Potassium: 2.4 mmol/L — CL (ref 3.5–5.1)
Potassium: 2.6 mmol/L — CL (ref 3.5–5.1)
Sodium: 135 mmol/L (ref 135–145)
Sodium: 135 mmol/L (ref 135–145)

## 2022-04-25 LAB — MAGNESIUM: Magnesium: 1.6 mg/dL — ABNORMAL LOW (ref 1.7–2.4)

## 2022-04-25 MED ORDER — POTASSIUM CHLORIDE CRYS ER 20 MEQ PO TBCR
40.0000 meq | EXTENDED_RELEASE_TABLET | ORAL | Status: AC
  Administered 2022-04-25 (×2): 40 meq via ORAL
  Filled 2022-04-25 (×2): qty 2

## 2022-04-25 MED ORDER — MAGNESIUM SULFATE 2 GM/50ML IV SOLN
2.0000 g | Freq: Once | INTRAVENOUS | Status: AC
  Administered 2022-04-25: 2 g via INTRAVENOUS
  Filled 2022-04-25: qty 50

## 2022-04-25 MED ORDER — POTASSIUM CHLORIDE CRYS ER 20 MEQ PO TBCR
40.0000 meq | EXTENDED_RELEASE_TABLET | Freq: Once | ORAL | Status: AC
  Administered 2022-04-25: 40 meq via ORAL
  Filled 2022-04-25: qty 2

## 2022-04-25 MED ORDER — POTASSIUM CHLORIDE 10 MEQ/100ML IV SOLN
10.0000 meq | INTRAVENOUS | Status: AC
  Administered 2022-04-25 (×3): 10 meq via INTRAVENOUS
  Filled 2022-04-25 (×3): qty 100

## 2022-04-25 NOTE — Progress Notes (Signed)
Critical result of Potassium 2.4. MD notified.

## 2022-04-25 NOTE — Progress Notes (Signed)
Central telemetry called and claimed this patient had nine episodes of SVTs. Patient was asymptomatic and lying comfortably on bed. Will continue to monitor.

## 2022-04-25 NOTE — Progress Notes (Signed)
Pt on 2LPM [place] when RT arrived to give meds.  Pt resting comfortably at this time and does not need bipap.

## 2022-04-25 NOTE — Assessment & Plan Note (Signed)
Hypomagnesemia. Hyponatremia.   Renal function with serum cr at 0,68, K is 3,1 and serum bicarbonate at 37. Na 133 and Mg 1.9   Continue K correction with Kcl, will add 80 meq in 2 divided doses. Continue diuresis and follow up renal function in am.

## 2022-04-25 NOTE — Progress Notes (Signed)
Critical lab result potassium 2.6. MD notified.

## 2022-04-25 NOTE — Progress Notes (Signed)
Progress Note   Patient: Charlene Houston SFK:[id number] DOB: 02-16-1946 DOA: 04/22/2022     3 DOS: the patient was seen and examined on 04/25/2022   Brief hospital course: Charlene Houston was admitted to the hospital with the working diagnosis of acute metabolic encephalopathy, acute on chronic hypercapnic respiratory failure.   77 yo female with the past medical history with heart failure, chronic respiratory failure, hypoxemic, hypertension, non small cell lung cancer. At home patient very confused and disorientated, generalized weakness and somnolence. As outpatient evaluation primidone was discontinue and was supposed to start topamax. On the day of admission she was found unresponsive and was transferred to the ED. On her initial physical examination her blood pressure was 98/48, HR 68, RR 18 and 02 saturation 98%, lungs with rales and increase work of breathing, abdomen with no distention, left buttock stage 2 pressure ulcer, no lower extremity edema.   Na 138, K 6,3 CL 87, bicarbonate 41, glucose 532, BUN 10, cr 1,0 AST 56, ALT 36  BNP 1,996  Wbc 12.8 hgb 9,7 plt 159  Sars covid 19 negative   VBG 7,41 75, 43, >50,   Head CT with no acute changes.   Chest radiograph with cardiomegaly, bilateral hilar vascular congestion. Positive port tip at the right atrium.   EKG 69 bpm, left axis deviation, interventricular conduction delay, sinus rhythm, poor R R wave progression, with no significant ST segment or T wave changes.   01/13 patient clinically improving with Bipap, not using it at night.  Will need home Bipap. Follow up with PT and OT.   01/14 mentation is improving.   Assessment and Plan: Respiratory failure with hypoxia and hypercapnia (HCC) Acute on chronic respiratory failure.  COPD exacerbation Obesity hypoventilation syndrome.   Continue to use bipap during the day and continuous at night.  Continue with acetazolamide.   Continue bronchodilatory, and airway  clearing techniques Inhaled corticosteroids. Now off systemic corticosteroids.  Out of bed to chair tid with meals She will need a home Bipap. At home she is alone most of the time she will benefit from SNF.   Acute metabolic encephalopathy, multifactorial, including respiratory failure.  Hold all psychoactive medications for now.  Her mentation continues to improve.    Acute on chronic systolic CHF (congestive heart failure) (HCC) Diastolic heart failure with no clinical signs of exacerbation.  Continue blood pressure monitoring.  Continue to hold on furosemide for now, avoid further alkalosis from contraction.  Echocardiogram with depressed LV systolic function EF 30 to 35%, global hypokinesis, mild LVH. RV systolic function with preserved. No significant valvular disease.   Systolic blood pressure 110 to 126 mmHg.   Diuresis with acetazolamide, furosemide 20 mg and spironolactone.  Continue with carvedilol, if blood pressure stable will add ARB.   Essential hypertension Continue blood pressure control with carvedilol.   Hypokalemia Hypomagnesemia.   Renal function with serum cr at 0,76 K is 2,6 and serum bicarbonate 40. Na 135 and Mg 1,6  Patient had 70 meq Kcl already this am, will add another 80 meq Kcl po and IV mag 2 g and follow up renal function and electrolytes in am.   Type 2 diabetes mellitus (HCC) Hyperglycemia.  Continue glucose cover and monitoring with insulin sliding scale.  Continue basal insulin.    Pressure injury of skin Continue local skin care.   Non-small cell lung cancer (HCC) Follow up as outpatient   Class 1 obesity Calculated BMI is 33,2  OSA  Subjective: Patient is feeling better, she is more awake and reactive   Physical Exam: Vitals:   04/25/22 1000 04/25/22 1100 04/25/22 1200 04/25/22 1300  BP:   (!) 108/56   Pulse: 80 75 75 82  Resp:   (!) 22   Temp:   98 F (36.7 C)   TempSrc:   Oral   SpO2: 100% 100% 100%  100%  Weight:      Height:       Neurology awake and alert ENT with mild pallor and facial edema Cardiovascular with S1 and S2 present and rhythmic with no gallops, rubs or murmurs Respiratory with no rales or wheezing. Abdomen with no distention  No lower extremity edema  Data Reviewed:    Family Communication: I spoke with patient's daughter and son at the bedside, we talked in detail about patient's condition, plan of care and prognosis and all questions were addressed.   Disposition: Status is: Inpatient Remains inpatient appropriate because: will need placement, can not stay alone at home   Planned Discharge Destination: Home and Skilled nursing facility    Author: Coralie Keens, MD 04/25/2022 3:05 PM  For on call review [email or web address].

## 2022-04-26 DIAGNOSIS — E119 Type 2 diabetes mellitus without complications: Secondary | ICD-10-CM | POA: Diagnosis not present

## 2022-04-26 DIAGNOSIS — I10 Essential (primary) hypertension: Secondary | ICD-10-CM | POA: Diagnosis not present

## 2022-04-26 DIAGNOSIS — E876 Hypokalemia: Secondary | ICD-10-CM | POA: Diagnosis not present

## 2022-04-26 DIAGNOSIS — J9621 Acute and chronic respiratory failure with hypoxia: Secondary | ICD-10-CM | POA: Diagnosis not present

## 2022-04-26 LAB — GLUCOSE, CAPILLARY
Glucose-Capillary: 152 mg/dL — ABNORMAL HIGH (ref 70–99)
Glucose-Capillary: 206 mg/dL — ABNORMAL HIGH (ref 70–99)
Glucose-Capillary: 242 mg/dL — ABNORMAL HIGH (ref 70–99)
Glucose-Capillary: 403 mg/dL — ABNORMAL HIGH (ref 70–99)

## 2022-04-26 LAB — BASIC METABOLIC PANEL
Anion gap: 9 (ref 5–15)
BUN: 8 mg/dL (ref 8–23)
CO2: 37 mmol/L — ABNORMAL HIGH (ref 22–32)
Calcium: 8.6 mg/dL — ABNORMAL LOW (ref 8.9–10.3)
Chloride: 87 mmol/L — ABNORMAL LOW (ref 98–111)
Creatinine, Ser: 0.68 mg/dL (ref 0.44–1.00)
GFR, Estimated: >60 mL/min (ref >60)
Glucose, Bld: 101 mg/dL — ABNORMAL HIGH (ref 70–99)
Potassium: 3.1 mmol/L — ABNORMAL LOW (ref 3.5–5.1)
Sodium: 133 mmol/L — ABNORMAL LOW (ref 135–145)

## 2022-04-26 LAB — MAGNESIUM: Magnesium: 1.9 mg/dL (ref 1.7–2.4)

## 2022-04-26 MED ORDER — ORAL CARE MOUTH RINSE: 15.0000 mL | OROMUCOSAL | Status: DC | PRN

## 2022-04-26 MED ORDER — INSULIN ASPART 100 UNIT/ML IJ SOLN
10.0000 [IU] | Freq: Once | INTRAMUSCULAR | Status: AC
  Administered 2022-04-26: 10 [IU] via SUBCUTANEOUS

## 2022-04-26 MED ORDER — POTASSIUM CHLORIDE CRYS ER 20 MEQ PO TBCR
40.0000 meq | EXTENDED_RELEASE_TABLET | ORAL | Status: AC
  Administered 2022-04-26 (×2): 40 meq via ORAL
  Filled 2022-04-26 (×2): qty 2

## 2022-04-26 MED ORDER — LOSARTAN POTASSIUM 25 MG PO TABS
25.0000 mg | ORAL_TABLET | Freq: Every day | ORAL | Status: DC
  Administered 2022-04-26 – 2022-04-29 (×4): 25 mg via ORAL
  Filled 2022-04-26 (×4): qty 1

## 2022-04-26 NOTE — Progress Notes (Signed)
Progress Note   Patient: Charlene Houston MWN:[id number] DOB: [birth year] DOA: 04/22/2022     4 DOS: the patient was seen and examined on 04/26/2022   Brief hospital course: Mrs. Shankles was admitted to the hospital with the working diagnosis of acute metabolic encephalopathy, acute on chronic hypercapnic respiratory failure.   77 yo female with the past medical history with heart failure, chronic respiratory failure, hypoxemic, hypertension, non small cell lung cancer. At home patient very confused and disorientated, generalized weakness and somnolence. As outpatient evaluation primidone was discontinue and was supposed to start topamax. On the day of admission she was found unresponsive and was transferred to the ED. On her initial physical examination her blood pressure was 98/48, HR 68, RR 18 and 02 saturation 98%, lungs with rales and increase work of breathing, abdomen with no distention, left buttock stage 2 pressure ulcer, no lower extremity edema.   Na 138, K 6,3 CL 87, bicarbonate 41, glucose 532, BUN 10, cr 1,0 AST 56, ALT 36  BNP 1,996  Wbc 12.8 hgb 9,7 plt 159  Sars covid 19 negative   VBG 7,41 75, 43, >50,   Head CT with no acute changes.   Chest radiograph with cardiomegaly, bilateral hilar vascular congestion. Positive port tip at the right atrium.   EKG 69 bpm, left axis deviation, interventricular conduction delay, sinus rhythm, poor R R wave progression, with no significant ST segment or T wave changes.   01/13 patient clinically improving with Bipap, not using it at night.  Will need home Bipap. Follow up with PT and OT.   01/14 mentation is improving.  01/15 patient continue to improve, but very weak and deconditioned, may need SNF.   Assessment and Plan: Respiratory failure with hypoxia and hypercapnia (HCC) Acute on chronic respiratory failure.  COPD exacerbation Obesity hypoventilation syndrome.   Continue to use bipap during the day and continuous at  night.  Continue with acetazolamide.   On bronchodilatory, and airway clearing techniques Inhaled corticosteroids.   Out of bed to chair tid with meals She will need a home Bipap. At home she is alone most of the time she will benefit from SNF.   Acute metabolic encephalopathy, multifactorial, including respiratory failure.  Hold all psychoactive medications for now.  Her mentation continues to improve.    Acute on chronic systolic CHF (congestive heart failure) (HCC) Diastolic heart failure with no clinical signs of exacerbation.  Continue blood pressure monitoring.  Continue to hold on furosemide for now, avoid further alkalosis from contraction.  Echocardiogram with depressed LV systolic function EF 30 to 35%, global hypokinesis, mild LVH. RV systolic function with preserved. No significant valvular disease.   Systolic blood pressure 403 to 103 mmHg.   Diuresis with acetazolamide, furosemide 20 mg and spironolactone.  Continue with carvedilol and resume losartan.   Essential hypertension Continue blood pressure control with carvedilol.   Hypokalemia Hypomagnesemia. Hyponatremia.   Renal function with serum cr at 0,68, K is 3,1 and serum bicarbonate at 37. Na 133 and Mg 1.9   Continue K correction with Kcl, will add 80 meq in 2 divided doses. Continue diuresis and follow up renal function in am.   Type 2 diabetes mellitus ([place]) Hyperglycemia.  Fasting glucose is 101.  Continue glucose cover and monitoring with insulin sliding scale.  Continue basal insulin.  Systemic steroids have been discontinued.    Pressure injury of skin Continue local skin care.   Non-small cell lung cancer ([place]) Follow up as outpatient  Class 1 obesity Calculated BMI is 33,2  OSA        Subjective: Patient this am is sitting in the chair at the side of the bed, she is more awake and alert, no dyspnea or chest pain, continue to be very weak and deconditioned   Physical  Exam: Vitals:   04/26/22 0028 04/26/22 0614 04/26/22 0931 04/26/22 1019  BP:  111/66 111/61 (!) 103/59  Pulse:  78 77 77  Resp:  (!) 21  20  Temp:  98.9 F (37.2 C)  98.8 F (37.1 C)  TempSrc:  Oral  Oral  SpO2:  95%  100%  Weight: 91.2 kg     Height:       Neurology awake and alert ENT with mild pallor, mild facial edema  Cardiovascular with S1 and S2 present and rhythmic with no gallops, rubs or murmurs Respiratory with no rales or wheezing, no rhonchi Abdomen with no distention  Trace lower extremity edema, non pitting  Data Reviewed:    Family Communication: no family at the bedside   Disposition: Status is: Inpatient Remains inpatient appropriate because: pending placement SNF, will need outpatient Bipap.   Planned Discharge Destination: Skilled nursing facility     Author: Coralie Keens, MD 04/26/2022 12:16 PM  For on call review [email or web address].

## 2022-04-26 NOTE — Care Management Important Message (Signed)
Important Message  Patient Details  Name: Charlene Houston MRN: [id number] Date of Birth: [birth year]   Medicare Important Message Given:  Yes     [name] Stefan Church 04/26/2022, 3:20 PM

## 2022-04-26 NOTE — NC FL2 (Signed)
[place] LEVEL OF CARE FORM     IDENTIFICATION  Patient Name: Charlene Houston Birthdate: 04-30-1945 Sex: female Admission Date (Current Location): 04/22/2022  [facility] [place] Number:  Herbalist and Address:         Provider Number:    Attending Physician Name and Address:  Tawni Millers,*  Relative Name and Phone Number:  Davy Pique [phone])    Current Level of Care: Hospital Recommended Level of Care: [place] Prior Approval Number:    Date Approved/Denied:   PASRR Number: [id number]  Discharge Plan: SNF    Current Diagnoses: Patient Active Problem List   Diagnosis Date Noted   Pressure injury of skin 04/23/2022   Class 1 obesity 04/23/2022   Acute on chronic systolic CHF (congestive heart failure) ([place]) 04/22/2022   Encephalopathy 04/22/2022   Drug-induced pneumonitis    Acute combined systolic and diastolic congestive heart failure ([place]) 01/03/2022   Type 2 diabetes mellitus ([place]) 01/01/2022   AKI (acute kidney injury) ([place]) 01/01/2022   Dizziness 06/10/2021   Respiratory failure with hypoxia and hypercapnia (HCC) 06/10/2021   Hypokalemia 06/10/2021   Hypomagnesemia 06/10/2021   GERD (gastroesophageal reflux disease) 06/10/2021   Non-small cell lung cancer ([place]) 06/10/2021   COVID-19 virus infection 04/23/2020   Hypoglycemia 04/22/2020   Rhabdomyolysis 04/22/2020   Essential hypertension 04/22/2020   HLD (hyperlipidemia) 04/22/2020   Sleep apnea 04/22/2020   Diabetes mellitus type 2 in nonobese ([place]) 04/22/2020   SBO (small bowel obstruction) ([place]) 07/06/2018    Orientation RESPIRATION BLADDER Height & Weight     Self, Place, Situation  O2 (2 liters) Incontinent, External catheter Weight: 201 lb 1 oz (91.2 kg) Height:  5\' 5"  (165.1 cm)  BEHAVIORAL SYMPTOMS/MOOD NEUROLOGICAL BOWEL NUTRITION STATUS      Incontinent Diet (See dc summary)  AMBULATORY STATUS COMMUNICATION OF NEEDS Skin    Limited Assist Verbally Normal                       Personal Care Assistance Level of Assistance  Bathing, Feeding, Dressing Bathing Assistance: Maximum assistance Feeding assistance: Limited assistance Dressing Assistance: Maximum assistance     Functional Limitations Info  Sight, Hearing, Speech Sight Info: Adequate Hearing Info: Adequate Speech Info: Adequate    SPECIAL CARE FACTORS FREQUENCY  PT (By licensed PT), OT (By licensed OT)     PT Frequency: 5xweek OT Frequency: 5xweek            Contractures Contractures Info: Not present    Additional Factors Info  Code Status, Allergies Code Status Info: Full Allergies Info: Prozac (Fluoxetine Hcl)  Vit D-vit E-safflower Oil           Current Medications (04/26/2022):  This is the current hospital active medication list Current Facility-Administered Medications  Medication Dose Route Frequency Provider Last Rate Last Admin   0.9 %  sodium chloride infusion  250 mL Intravenous PRN Wynetta Fines T, MD       acetaminophen (TYLENOL) tablet 650 mg  650 mg Oral Q6H PRN Wynetta Fines T, MD   650 mg at 04/26/22 1332   acetaZOLAMIDE (DIAMOX) tablet 250 mg  250 mg Oral BID Tawni Millers, MD   250 mg at 04/26/22 0931   carvedilol (COREG) tablet 3.125 mg  3.125 mg Oral BID Wynetta Fines T, MD   3.125 mg at 04/26/22 0931   enoxaparin (LOVENOX) injection 40 mg  40 mg Subcutaneous Q24H Wynetta Fines  T, MD   40 mg at 04/26/22 0931   fluticasone (FLONASE) 50 MCG/ACT nasal spray 1 spray  1 spray Each Nare PRN Emeline General, MD       furosemide (LASIX) tablet 20 mg  20 mg Oral Daily Arrien, York Ram, MD   20 mg at 04/26/22 6295   Gerhardt's butt cream   Topical TID Coralie Keens, MD   Given at 04/26/22 0930   hydrocortisone 1 % ointment   Topical BID Emeline General, MD   Given at 04/26/22 0930   insulin aspart (novoLOG) injection 0-20 Units  0-20 Units Subcutaneous TID WC Emeline General, MD   7 Units at 04/26/22  1142   insulin glargine-yfgn (SEMGLEE) injection 10 Units  10 Units Subcutaneous Daily Mikey College T, MD   10 Units at 04/26/22 0931   ipratropium-albuterol (DUONEB) 0.5-2.5 (3) MG/3ML nebulizer solution 3 mL  3 mL Nebulization Q2H PRN Arrien, York Ram, MD       ipratropium-albuterol (DUONEB) 0.5-2.5 (3) MG/3ML nebulizer solution 3 mL  3 mL Nebulization BID Arrien, York Ram, MD   3 mL at 04/26/22 0944   losartan (COZAAR) tablet 25 mg  25 mg Oral Daily Arrien, York Ram, MD   25 mg at 04/26/22 1316   mometasone-formoterol (DULERA) 200-5 MCG/ACT inhaler 2 puff  2 puff Inhalation BID Coralie Keens, MD   2 puff at 04/26/22 0930   montelukast (SINGULAIR) tablet 10 mg  10 mg Oral Q1500 Mikey College T, MD   10 mg at 04/25/22 1448   ondansetron (ZOFRAN) injection 4 mg  4 mg Intravenous Q6H PRN Emeline General, MD       Oral care mouth rinse  15 mL Mouth Rinse PRN Arrien, York Ram, MD       polyvinyl alcohol (LIQUIFILM TEARS) 1.4 % ophthalmic solution 1 drop  1 drop Both Eyes PRN Mikey College T, MD       potassium chloride SA (KLOR-CON M) CR tablet 40 mEq  40 mEq Oral Q4H Arrien, York Ram, MD   40 mEq at 04/26/22 1316   sodium chloride flush (NS) 0.9 % injection 3 mL  3 mL Intravenous Q12H Mikey College T, MD   3 mL at 04/26/22 0939   sodium chloride flush (NS) 0.9 % injection 3 mL  3 mL Intravenous PRN Mikey College T, MD       spironolactone (ALDACTONE) tablet 25 mg  25 mg Oral Daily Arrien, York Ram, MD   25 mg at 04/26/22 2841   witch hazel-glycerin (TUCKS) pad 1 Application  1 Application Topical PRN Emeline General, MD         Discharge Medications: Please see discharge summary for a list of discharge medications.  Relevant Imaging Results:  Relevant Lab Results:   Additional Information SSN: [id number]  Jae Bruck, MSW, LCSWA, LCASA Transitions of Care  Clinical Social Worker I

## 2022-04-26 NOTE — TOC Progression Note (Signed)
Transition of Care [facility]) - Progression Note    Patient Details  Name: Charlene Houston MRN: [id number] Date of Birth: 07-Jan-1946  [facility] CM/SW Contact  Leander Rams, LCSW Phone Number: 04/26/2022, 2:05 PM  Clinical Narrative:    CSW spoke with pt daughter and daughter has requested for pt to go to SNF. She believes she will have the most help there versus home. CSW completed fl2 and faxed out to several facilities in [place], including [place] which daughter requested.   CSW reached out to PT to have recommendation updated to SNF.       Expected Discharge Plan and Services                                               Social Determinants of Health (SDOH) Interventions SDOH Screenings   Food Insecurity: No Food Insecurity (01/04/2022)  Housing: Low Risk  (01/04/2022)  Transportation Needs: No Transportation Needs (01/04/2022)  Utilities: Not At Risk (01/04/2022)  Alcohol Screen: Low Risk  (01/04/2022)  Financial Resource Strain: Low Risk  (01/04/2022)  Tobacco Use: Medium Risk (03/26/2022)    Readmission Risk Interventions     No data to display        Oletta Lamas, MSW, LCSWA, LCASA Transitions of Care  Clinical Social Worker I

## 2022-04-27 DIAGNOSIS — J9621 Acute and chronic respiratory failure with hypoxia: Secondary | ICD-10-CM | POA: Diagnosis not present

## 2022-04-27 DIAGNOSIS — E119 Type 2 diabetes mellitus without complications: Secondary | ICD-10-CM | POA: Diagnosis not present

## 2022-04-27 DIAGNOSIS — I10 Essential (primary) hypertension: Secondary | ICD-10-CM | POA: Diagnosis not present

## 2022-04-27 DIAGNOSIS — I5023 Acute on chronic systolic (congestive) heart failure: Secondary | ICD-10-CM | POA: Diagnosis not present

## 2022-04-27 LAB — BASIC METABOLIC PANEL
Anion gap: 10 (ref 5–15)
BUN: 9 mg/dL (ref 8–23)
CO2: 32 mmol/L (ref 22–32)
Calcium: 8.4 mg/dL — ABNORMAL LOW (ref 8.9–10.3)
Chloride: 93 mmol/L — ABNORMAL LOW (ref 98–111)
Creatinine, Ser: 0.69 mg/dL (ref 0.44–1.00)
GFR, Estimated: >60 mL/min (ref >60)
Glucose, Bld: 234 mg/dL — ABNORMAL HIGH (ref 70–99)
Potassium: 3.3 mmol/L — ABNORMAL LOW (ref 3.5–5.1)
Sodium: 135 mmol/L (ref 135–145)

## 2022-04-27 LAB — GLUCOSE, CAPILLARY
Glucose-Capillary: 238 mg/dL — ABNORMAL HIGH (ref 70–99)
Glucose-Capillary: 205 mg/dL — ABNORMAL HIGH (ref 70–99)
Glucose-Capillary: 210 mg/dL — ABNORMAL HIGH (ref 70–99)
Glucose-Capillary: 209 mg/dL — ABNORMAL HIGH (ref 70–99)
Glucose-Capillary: 118 mg/dL — ABNORMAL HIGH (ref 70–99)

## 2022-04-27 MED ORDER — POTASSIUM CHLORIDE CRYS ER 20 MEQ PO TBCR
40.0000 meq | EXTENDED_RELEASE_TABLET | ORAL | Status: AC
  Administered 2022-04-28 (×2): 40 meq via ORAL
  Filled 2022-04-27 (×2): qty 2

## 2022-04-27 NOTE — Progress Notes (Signed)
Physical Therapy Treatment Patient Details Name: Charlene Houston MRN: [id number] DOB: 08-Nov-1945 Today's Date: 04/27/2022   History of Present Illness Pt is a 77 y.o. female admitted 04/22/22 after being found unresponsive. Workup for acute metabolic encephalopathy, acute on chronic hypercapnic respiratory, COPD exacerbation. Head CT negative for acute injury. Pt also with L buttock stage 2 pressure ulcer. PMH includes lung CA, breast CA, HTN, OSA, DM, arthritis, obesity, CHF, COPD.   PT Comments    Pt making incremental progress with mobility. Pt demonstrates improving cognition, though inconsistent effort with activity and declines ambulation due to fatigue from the weather. Pt able to stand and take steps with rollator and minA. Pt limited by generalized weakness, decreased activity tolerance, poor balance strategies/postural reactions and impaired cognition. Pt reports family unable to provide necessary assist; recommend SNF-level therapies to maximize functional mobility and independence prior to return home.     Recommendations for follow up therapy are one component of a multi-disciplinary discharge planning process, led by the attending physician.  Recommendations may be updated based on patient status, additional functional criteria and insurance authorization.  Follow Up Recommendations  Skilled nursing-short term rehab (<3 hours/day) Can patient physically be transported by private vehicle: Yes   Assistance Recommended at Discharge Frequent or constant Supervision/Assistance  Patient can return home with the following A little help with walking and/or transfers;A little help with bathing/dressing/bathroom;Assistance with cooking/housework;Direct supervision/assist for medications management;Direct supervision/assist for financial management;Assist for transportation;Help with stairs or ramp for entrance   Equipment Recommendations  Other (comment) (defer to next venue)     Recommendations for Other Services       Precautions / Restrictions Precautions Precautions: Fall;Other (comment) Precaution Comments: watch SpO2 (wears 2L O2 baseline; reports increasing to 4-5L with activity at home); urine incontinence Restrictions Weight Bearing Restrictions: No     Mobility  Bed Mobility Overal bed mobility: Needs Assistance Bed Mobility: Sit to Supine       Sit to supine: Min assist   General bed mobility comments: pt initially laying straight back onto bed, minA for HHA to elevate trunk back up, cues for sidelying then rolling back with minA for LE management    Transfers Overall transfer level: Needs assistance Equipment used: Rolling walker (2 wheels) Transfers: Sit to/from Stand Sit to Stand: Max assist, Min assist   Step pivot transfers: Min guard       General transfer comment: pt attempting to stand 4x from recliner to rollator but reports she "can't" because too tired with weather, repeated cues for sequencing and hand placement, increased time for seated rest due to fatigue and SOB with standing attempts. pt then reports wanting to return to bed, able to stand with minA for trunk elevation to stand with min guard for pivotal steps to bed with rollator    Ambulation/Gait               General Gait Details: pt declines secondary to fatigue due to the weather   Stairs             Wheelchair Mobility    Modified Rankin (Stroke Patients Only)       Balance Overall balance assessment: Needs assistance Sitting-balance support: No upper extremity supported, Feet supported Sitting balance-Leahy Scale: Fair     Standing balance support: During functional activity, Reliant on assistive device for balance Standing balance-Leahy Scale: Poor  Cognition Arousal/Alertness: Awake/alert, Lethargic Behavior During Therapy: Flat affect Overall Cognitive Status: No family/caregiver present  to determine baseline cognitive functioning                                 General Comments: cognition improving, though pt still with slowed processing; reports she can't walk because she's too tired with the weather, difficulty reasoning with pt        Exercises      General Comments General comments (skin integrity, edema, etc.): SpO2 99% on 4L O2 [place] for brief bout of standing activity. increased time discussing pt's desire for post-acute rehab at SNF, educ on importance of mobility and expectations for activity with PT/OT at SNF      Pertinent Vitals/Pain Pain Assessment Pain Assessment: Faces Faces Pain Scale: Hurts a little bit Pain Location: RLE Pain Descriptors / Indicators: Discomfort Pain Intervention(s): Monitored during session    Home Living                          Prior Function            PT Goals (current goals can now be found in the care plan section) Progress towards PT goals: Progressing toward goals (slowly)    Frequency    Min 2X/week      PT Plan Discharge plan needs to be updated;Frequency needs to be updated    Co-evaluation              AM-PAC PT "6 Clicks" Mobility   Outcome Measure  Help needed turning from your back to your side while in a flat bed without using bedrails?: A Little Help needed moving from lying on your back to sitting on the side of a flat bed without using bedrails?: A Little Help needed moving to and from a bed to a chair (including a wheelchair)?: A Little Help needed standing up from a chair using your arms (e.g., wheelchair or bedside chair)?: A Little Help needed to walk in hospital room?: Total Help needed climbing 3-5 steps with a railing? : Total 6 Click Score: 14    End of Session Equipment Utilized During Treatment: Gait belt;Oxygen Activity Tolerance: Patient limited by fatigue Patient left: with call bell/phone within reach;with bed alarm set Nurse Communication:  Mobility status PT Visit Diagnosis: Other abnormalities of gait and mobility (R26.89)     Time: 2992-4268 PT Time Calculation (min) (ACUTE ONLY): 21 min  Charges:  $Therapeutic Activity: 8-22 mins                     Ina Homes, PT, DPT Acute Rehabilitation Services  Personal: Secure Chat Rehab Office: [phone]  Malachy Chamber 04/27/2022, 2:27 PM

## 2022-04-27 NOTE — Progress Notes (Signed)
Progress Note   Patient: [name] CBJ:[id number] DOB: Aug 17, 1945 DOA: 04/22/2022     5 DOS: the patient was seen and examined on 04/27/2022   Brief hospital course: Mrs. Kuzel was admitted to the hospital with the working diagnosis of acute metabolic encephalopathy, acute on chronic hypercapnic respiratory failure.   77 yo female with the past medical history with heart failure, chronic respiratory failure, hypoxemic, hypertension, non small cell lung cancer. At home patient very confused and disorientated, generalized weakness and somnolence. As outpatient evaluation primidone was discontinue and was supposed to start topamax. On the day of admission she was found unresponsive and was transferred to the ED. On her initial physical examination her blood pressure was 98/48, HR 68, RR 18 and 02 saturation 98%, lungs with rales and increase work of breathing, abdomen with no distention, left buttock stage 2 pressure ulcer, no lower extremity edema.   Na 138, K 6,3 CL 87, bicarbonate 41, glucose 532, BUN 10, cr 1,0 AST 56, ALT 36  BNP 1,996  Wbc 12.8 hgb 9,7 plt 159  Sars covid 19 negative   VBG 7,41 75, 43, >50,   Head CT with no acute changes.   Chest radiograph with cardiomegaly, bilateral hilar vascular congestion. Positive port tip at the right atrium.   EKG 69 bpm, left axis deviation, interventricular conduction delay, sinus rhythm, poor R R wave progression, with no significant ST segment or T wave changes.   01/13 patient clinically improving with Bipap, not using it at night.  Will need home Bipap. Follow up with PT and OT.   01/14 mentation is improving.  01/15 patient continue to improve, but very weak and deconditioned, may need SNF.   Assessment and Plan: Respiratory failure with hypoxia and hypercapnia (HCC) Acute on chronic respiratory failure.  COPD exacerbation Obesity hypoventilation syndrome.   Continue to use bipap during the day and continuous at  night.  Continue with acetazolamide.   On bronchodilatory, and airway clearing techniques Inhaled corticosteroids.   Out of bed to chair tid with meals She will need a to continue with Bipap after her discharge to use at night.  Obesity hypoventilation syndrome.   Acute metabolic encephalopathy, multifactorial, including respiratory failure.  Hold all psychoactive medications for now.  Her mentation continues to improve, I suspect is back to her baseline.  Continue very deconditioned will need SNF  Acute on chronic systolic CHF (congestive heart failure) (HCC) Echocardiogram with depressed LV systolic function EF 30 to 35%, global hypokinesis, mild LVH. RV systolic function with preserved. No significant valvular disease.   Systolic blood pressure 113 to 103 mmHg.   Diuresis with acetazolamide, furosemide 20 mg and spironolactone.  Continue with carvedilol and resume losartan.   Essential hypertension Continue blood pressure control with carvedilol.   Hypokalemia Hypomagnesemia. Hyponatremia.   Renal function with serum cr at .69 K is 3,3 and serum bicarbonate at 32. Na 132,  Continue with K correction with KCL, will re oreder 40 meq x2 and follow up electrolytes in am.    Type 2 diabetes mellitus (HCC) Hyperglycemia.  Continue glucose cover and monitoring with insulin sliding scale.  Continue basal insulin.  Systemic steroids have been discontinued.    Pressure injury of skin Continue local skin care.   Non-small cell lung cancer (HCC) Follow up as outpatient   Class 1 obesity Calculated BMI is 33,2  OSA        Subjective: Patient with improvement in mentation and dyspnea.   Physical  Exam: Vitals:   04/27/22 0837 04/27/22 1114 04/27/22 1900 04/27/22 2035  BP: (!) 116/59 (!) 101/51 (!) 109/55   Pulse: 72 71 72   Resp: 20 18 17    Temp: 98.2 F (36.8 C) 98.3 F (36.8 C) 98.4 F (36.9 C)   TempSrc: Oral Oral Oral   SpO2: 100% 100% 100% 100%  Weight:       Height:       Neurology awake and alert ENT with mild pallor Cardiovascular with S1 and S2 present and rhythmic with no gallops Respiratory with no rales or wheezing' Abdomen with no distention  No lower extremity edema  Data Reviewed:    Family Communication: no family at the bedside   Disposition: Status is: Inpatient Remains inpatient appropriate because: pending transfer to SNF  Planned Discharge Destination: Skilled nursing facility      Author: Tawni Millers, MD 04/27/2022 11:10 PM  For on call review [email or web address].

## 2022-04-27 NOTE — Progress Notes (Signed)
Occupational Therapy Treatment Patient Details Name: Charlene Houston MRN: [id number] DOB: 1945/08/17 Today's Date: 04/27/2022   History of present illness Pt is a 77 y.o. female admitted 04/22/22 after being found unresponsive. Workup for acute metabolic encephalopathy, acute on chronic hypercapnic respiratory, COPD exacerbation. Head CT negative for acute injury. Pt also with L buttock stage 2 pressure ulcer. PMH includes lung CA, breast CA, HTN, OSA, DM, arthritis, obesity, CHF, COPD.   OT comments  Pt progressing towards goals, noted improved cognition this session, pt attending to task and following commands without need for redirection. Pt needing set up A for seated grooming task, min guard for simulated toilet transfer with RW, and min A for bed mobility. Pt needing seated rest break x1 during session. SpO2 dropping to 87% at lowest on 2L with poor wave form, improved to 90's once seated in chair and new O2 sensor applied. Pt presenting with impairments listed below, will follow acutely. Updating d/c recommendation to SNF.   Recommendations for follow up therapy are one component of a multi-disciplinary discharge planning process, led by the attending physician.  Recommendations may be updated based on patient status, additional functional criteria and insurance authorization.    Follow Up Recommendations  Skilled nursing-short term rehab (<3 hours/day)     Assistance Recommended at Discharge Frequent or constant Supervision/Assistance  Patient can return home with the following  A little help with walking and/or transfers;A lot of help with bathing/dressing/bathroom;Assistance with cooking/housework;Direct supervision/assist for medications management;Direct supervision/assist for financial management;Assist for transportation;Help with stairs or ramp for entrance   Equipment Recommendations  Tub/shower seat    Recommendations for Other Services PT consult    Precautions /  Restrictions Precautions Precautions: Fall;Other (comment) Precaution Comments: watch SpO2 (wears 2L O2 baseline reports increaseing O2 to 4-5L with activity at home) Restrictions Weight Bearing Restrictions: No       Mobility Bed Mobility Overal bed mobility: Needs Assistance Bed Mobility: Supine to Sit     Supine to sit: Min assist, HOB elevated     General bed mobility comments: increased time, cues to scoot to EOB    Transfers Overall transfer level: Needs assistance Equipment used: Rolling walker (2 wheels) Transfers: Sit to/from Stand Sit to Stand: Min assist     Step pivot transfers: Min assist     General transfer comment: from elevated bed height     Balance Overall balance assessment: Needs assistance Sitting-balance support: No upper extremity supported, Feet supported Sitting balance-Leahy Scale: Good     Standing balance support: During functional activity, Reliant on assistive device for balance Standing balance-Leahy Scale: Poor Standing balance comment: reliant on external support                           ADL either performed or assessed with clinical judgement   ADL Overall ADL's : Needs assistance/impaired Eating/Feeding: Supervision/ safety   Grooming: Set up;Wash/dry face;Sitting Grooming Details (indicate cue type and reason): washing face seated in chair                 Toilet Transfer: Min guard;BSC/3in1;Rolling walker (2 wheels);Minimal assistance;Stand-pivot Toilet Transfer Details (indicate cue type and reason): simulated to chair         Functional mobility during ADLs: Min guard;Rolling walker (2 wheels)      Extremity/Trunk Assessment Upper Extremity Assessment Upper Extremity Assessment: Generalized weakness   Lower Extremity Assessment Lower Extremity Assessment: Defer to PT evaluation  Vision   Vision Assessment?: No apparent visual deficits   Perception Perception Perception: Not  tested   Praxis Praxis Praxis: Not tested    Cognition Arousal/Alertness: Awake/alert Behavior During Therapy: WFL for tasks assessed/performed, Anxious Overall Cognitive Status: No family/caregiver present to determine baseline cognitive functioning                                 General Comments: improvement from evaluation, pt with improved attention and command follow        Exercises      Shoulder Instructions       General Comments SpO2 87% at lowest on 2L O2 with poor wave pleth, increased to 90's once seated in chair with new O2 sensor applied    Pertinent Vitals/ Pain       Pain Assessment Pain Assessment: No/denies pain  Home Living                                          Prior Functioning/Environment              Frequency  Min 2X/week        Progress Toward Goals  OT Goals(current goals can now be found in the care plan section)  Progress towards OT goals: Progressing toward goals  Acute Rehab OT Goals Patient Stated Goal: none stated OT Goal Formulation: With patient Time For Goal Achievement: 05/08/22 Potential to Achieve Goals: Good ADL Goals Pt Will Perform Upper Body Dressing: with supervision;standing;sitting Pt Will Perform Lower Body Dressing: with supervision;sitting/lateral leans;sit to/from stand Pt Will Transfer to Toilet: with supervision;ambulating;regular height toilet  Plan Discharge plan needs to be updated;Frequency remains appropriate    Co-evaluation                 AM-PAC OT "6 Clicks" Daily Activity     Outcome Measure   Help from another person eating meals?: None Help from another person taking care of personal grooming?: A Little Help from another person toileting, which includes using toliet, bedpan, or urinal?: A Lot Help from another person bathing (including washing, rinsing, drying)?: A Lot Help from another person to put on and taking off regular upper body  clothing?: A Lot Help from another person to put on and taking off regular lower body clothing?: A Lot 6 Click Score: 15    End of Session Equipment Utilized During Treatment: Gait belt;Rolling walker (2 wheels);Oxygen  OT Visit Diagnosis: Unsteadiness on feet (R26.81);Other abnormalities of gait and mobility (R26.89);Muscle weakness (generalized) (M62.81)   Activity Tolerance Patient tolerated treatment well   Patient Left in chair;with call bell/phone within reach;with chair alarm set   Nurse Communication Mobility status        Time: 4573-3448 OT Time Calculation (min): 22 min  Charges: OT General Charges $OT Visit: 1 Visit OT Treatments $Self Care/Home Management : 8-22 mins  Charlene Houston, OTD, OTR/L SecureChat Preferred Acute Rehab [phone]   Charlene Houston 04/27/2022, 10:58 AM

## 2022-04-27 NOTE — TOC Progression Note (Signed)
Transition of Care [facility]) - Progression Note    Patient Details  Name: [name] MRN: [id number] Date of Birth: Jul 10, 1945  Transition of Care [facility]) CM/SW Contact  Leander Rams, LCSW Phone Number: 04/27/2022, 3:43 PM  Clinical Narrative:    CSW notified pt daughter that [facility] SNF has a bed available for pt. [facility] informed CSW that they would have a semi private available tomorrow. CSW has started insurance auth and it is currently pending. TOC will continue to follow.        Expected Discharge Plan and Services                                               Social Determinants of Health (SDOH) Interventions SDOH Screenings   Food Insecurity: No Food Insecurity (01/04/2022)  Housing: Low Risk  (01/04/2022)  Transportation Needs: No Transportation Needs (01/04/2022)  Utilities: Not At Risk (01/04/2022)  Alcohol Screen: Low Risk  (01/04/2022)  Financial Resource Strain: Low Risk  (01/04/2022)  Tobacco Use: Medium Risk (03/26/2022)    Readmission Risk Interventions     No data to display         Oletta Lamas, MSW, LCSWA, LCASA Transitions of Care  Clinical Social Worker I

## 2022-04-27 NOTE — Progress Notes (Addendum)
PROGRESS NOTE    Charlene Houston  GIJ:[id number] DOB: July 11, 1945 DOA: 04/22/2022 PCP: Malka So., MD   76/F w/F with CHF, chronic respiratory failure, hypoxemic, hypertension, non small cell lung cancer. Admitted w/ confusion,  disorientation, generalized weakness and somnolence. Outpatient evaluation primidone was discontinued and was supposed to start topamax. On the day of admission she was found unresponsive and was transferred to the ED. In ED, confused w/ rales and increase work of breathing,  left buttock stage 2 pressure ulcer, no lower extremity edema. BNP 1,996, Wbc 12.8 hgb 9,7 Head CT with no acute changes.  CXR w/cardiomegaly, bilateral hilar vascular congestion.   -1/13 patient clinically improving with Bipap, not using it at night.  Will need home Bipap. Follow up with PT and OT.   -1/14 mentation is improving.  -1/15 patient continue to improve, but very weak and deconditioned, may need SNF. -1/17, potassium down to 2.5  Subjective: Started getting supplemental potassium early this morning, did not use BiPAP last night, constantly interrupted by potassium   Assessment and Plan:  Acute hypoxic and Hypercarbic Respiratory failure COPD exacerbation Obesity hypoventilation syndrome.  -Continue BIPAP Q nap and QHS, on 2 L home O2 at baseline -Continue p.o. Lasix, acetazolamide -duoneb, pulm toilet -She will need a home Bipap. -Discharge planning, SNF tomorrow, K was 2.5 earlier this morning  Acute metabolic encephalopathy, multifactorial, including respiratory failure, polypharmacy.  -Neurontin and Lexapro on hold -mentation continues to improve.  Will restart Lexapro  Acute on chronic systolic CHF (congestive heart failure) (HCC) -Echo EF 30 to 35%, global hypokinesis, mild LVH. RV systolic function with preserved. No significant valvular disease.  -Diuresed briefly with IV Lasix, I/os inaccurate -Continue oral Lasix, Aldactone, acetazolamide, carvedilol  and losartan  -Restart Jardiance, was on this prior to admission  Hypokalemia Hypomagnesemia.  -replace  Type 2 diabetes mellitus (HCC) -Continue basal insulin.  Systemic steroids have been discontinued.   Pressure injury of skin-poa Continue local skin care.   Non-small cell lung cancer (HCC) Follow up as outpatient   Class 1 obesity Calculated BMI is 33,2  OSA   DVT prophylaxis: lovenox Code Status: Full Code Family Communication: None present Disposition Plan: SNF tomorrow  Consultants:    Procedures:   Antimicrobials:    Objective: Vitals:   04/27/22 0837 04/27/22 1114 04/27/22 1900 04/27/22 2035  BP: (!) 116/59 (!) 101/51 (!) 109/55   Pulse: 72 71 72   Resp: 20 18 17    Temp: 98.2 F (36.8 C) 98.3 F (36.8 C) 98.4 F (36.9 C)   TempSrc: Oral Oral Oral   SpO2: 100% 100% 100% 100%  Weight:      Height:        Intake/Output Summary (Last 24 hours) at 04/27/2022 2110 Last data filed at 04/27/2022 1200 Gross per 24 hour  Intake 360 ml  Output 700 ml  Net -340 ml   Filed Weights   04/25/22 0420 04/26/22 0028 04/27/22 0443  Weight: 90.1 kg 91.2 kg 90.5 kg    Examination:  Pleasant chronically ill elderly female sitting up in bed, AAOx3 CVS: S1-S2, regular rhythm Lungs: Decreased breath sounds at the bases Abdomen: Soft, nontender, bowel sounds present Extremities: No edema Psych: Appropriate mood and affect  Data Reviewed:   CBC: Recent Labs  Lab 04/22/22 1537 04/22/22 1822  WBC 12.8*  --   HGB 9.7* 11.2*  HCT 32.2* 33.0*  MCV 104.9*  --   PLT 159  --    Basic Metabolic Panel:  Recent Labs  Lab 04/22/22 1814 04/22/22 1822 04/24/22 0026 04/25/22 0045 04/25/22 0748 04/26/22 0032 04/27/22 0101  NA  --    < > 145 135 135 133* 135  K 5.7*   < > 3.7 2.4* 2.6* 3.1* 3.3*  CL  --    < > 85* 81* 85* 87* 93*  CO2  --    < > >45* 42* 40* 37* 32  GLUCOSE  --    < > 125* 271* 257* 101* 234*  BUN  --    < > 9 10 8 8 9   CREATININE  --     < > 0.77 0.83 0.76 0.68 0.69  CALCIUM  --    < > 9.0 8.3* 8.2* 8.6* 8.4*  MG 1.8  --   --  1.6*  --  1.9  --    < > = values in this interval not displayed.   GFR: Estimated Creatinine Clearance: 67.5 mL/min (by C-G formula based on SCr of 0.69 mg/dL). Liver Function Tests: Recent Labs  Lab 04/22/22 1537  AST 56*  ALT 36  ALKPHOS 63  BILITOT 1.0  PROT 5.9*  ALBUMIN 2.6*   No results for input(s): "LIPASE", "AMYLASE" in the last 168 hours. Recent Labs  Lab 04/22/22 1814  AMMONIA 43*   Coagulation Profile: No results for input(s): "INR", "PROTIME" in the last 168 hours. Cardiac Enzymes: No results for input(s): "CKTOTAL", "CKMB", "CKMBINDEX", "TROPONINI" in the last 168 hours. BNP (last 3 results) No results for input(s): "PROBNP" in the last 8760 hours. HbA1C: No results for input(s): "HGBA1C" in the last 72 hours. CBG: Recent Labs  Lab 04/26/22 2114 04/27/22 0605 04/27/22 1111 04/27/22 1204 04/27/22 1616  GLUCAP 403* 238* 205* 210* 209*   Lipid Profile: No results for input(s): "CHOL", "HDL", "LDLCALC", "TRIG", "CHOLHDL", "LDLDIRECT" in the last 72 hours. Thyroid Function Tests: No results for input(s): "TSH", "T4TOTAL", "FREET4", "T3FREE", "THYROIDAB" in the last 72 hours. Anemia Panel: No results for input(s): "VITAMINB12", "FOLATE", "FERRITIN", "TIBC", "IRON", "RETICCTPCT" in the last 72 hours. Urine analysis:    Component Value Date/Time   COLORURINE STRAW (A) 02/20/2022 2002   APPEARANCEUR CLEAR 02/20/2022 2002   LABSPEC 1.021 02/20/2022 2002   PHURINE 7.0 02/20/2022 2002   GLUCOSEU >=500 (A) 02/20/2022 2002   HGBUR NEGATIVE 02/20/2022 2002   BILIRUBINUR NEGATIVE 02/20/2022 2002   KETONESUR NEGATIVE 02/20/2022 2002   PROTEINUR NEGATIVE 02/20/2022 2002   NITRITE NEGATIVE 02/20/2022 2002   LEUKOCYTESUR SMALL (A) 02/20/2022 2002   Sepsis Labs: @LABRCNTIP (procalcitonin:4,lacticidven:4)  ) Recent Results (from the past 240 hour(s))  Resp panel by  RT-PCR (RSV, Flu A&B, Covid) Anterior Nasal Swab     Status: None   Collection Time: 04/22/22  3:22 PM   Specimen: Anterior Nasal Swab  Result Value Ref Range Status   SARS Coronavirus 2 by RT PCR NEGATIVE NEGATIVE Final    Comment: (NOTE) SARS-CoV-2 target nucleic acids are NOT DETECTED.  The SARS-CoV-2 RNA is generally detectable in upper respiratory specimens during the acute phase of infection. The lowest concentration of SARS-CoV-2 viral copies this assay can detect is 138 copies/mL. A negative result does not preclude SARS-Cov-2 infection and should not be used as the sole basis for treatment or other patient management decisions. A negative result may occur with  improper specimen collection/handling, submission of specimen other than nasopharyngeal swab, presence of viral mutation(s) within the areas targeted by this assay, and inadequate number of viral copies(<138 copies/mL). A negative result must  be combined with clinical observations, patient history, and epidemiological information. The expected result is Negative.  Fact Sheet for Patients:  BloggerCourse.com  Fact Sheet for Healthcare Providers:  SeriousBroker.it  This test is no t yet approved or cleared by the [place] FDA and  has been authorized for detection and/or diagnosis of SARS-CoV-2 by FDA under an Emergency Use Authorization (EUA). This EUA will remain  in effect (meaning this test can be used) for the duration of the COVID-19 declaration under Section 564(b)(1) of the Act, 21 U.S.C.section 360bbb-3(b)(1), unless the authorization is terminated  or revoked sooner.       Influenza A by PCR NEGATIVE NEGATIVE Final   Influenza B by PCR NEGATIVE NEGATIVE Final    Comment: (NOTE) The Xpert Xpress SARS-CoV-2/FLU/RSV plus assay is intended as an aid in the diagnosis of influenza from Nasopharyngeal swab specimens and should not be used as a sole basis  for treatment. Nasal washings and aspirates are unacceptable for Xpert Xpress SARS-CoV-2/FLU/RSV testing.  Fact Sheet for Patients: BloggerCourse.com  Fact Sheet for Healthcare Providers: SeriousBroker.it  This test is not yet approved or cleared by the [place] FDA and has been authorized for detection and/or diagnosis of SARS-CoV-2 by FDA under an Emergency Use Authorization (EUA). This EUA will remain in effect (meaning this test can be used) for the duration of the COVID-19 declaration under Section 564(b)(1) of the Act, 21 U.S.C. section 360bbb-3(b)(1), unless the authorization is terminated or revoked.     Resp Syncytial Virus by PCR NEGATIVE NEGATIVE Final    Comment: (NOTE) Fact Sheet for Patients: BloggerCourse.com  Fact Sheet for Healthcare Providers: SeriousBroker.it  This test is not yet approved or cleared by the [place] FDA and has been authorized for detection and/or diagnosis of SARS-CoV-2 by FDA under an Emergency Use Authorization (EUA). This EUA will remain in effect (meaning this test can be used) for the duration of the COVID-19 declaration under Section 564(b)(1) of the Act, 21 U.S.C. section 360bbb-3(b)(1), unless the authorization is terminated or revoked.  Performed at [facility], [address] [place], [place] [address]   C Difficile Quick Screen w PCR reflex     Status: None   Collection Time: 04/22/22  7:51 PM   Specimen: STOOL  Result Value Ref Range Status   C Diff antigen NEGATIVE NEGATIVE Final   C Diff toxin NEGATIVE NEGATIVE Final   C Diff interpretation No C. difficile detected.  Final    Comment: Performed at [facility], [address], [place], [place] [address]     Radiology Studies: No results found.   Scheduled Meds:  acetaZOLAMIDE  250 mg Oral BID   carvedilol  3.125 mg Oral BID   enoxaparin  (LOVENOX) injection  40 mg Subcutaneous Q24H   furosemide  20 mg Oral Daily   Gerhardt's butt cream   Topical TID   hydrocortisone   Topical BID   insulin aspart  0-20 Units Subcutaneous TID WC   insulin glargine-yfgn  10 Units Subcutaneous Daily   ipratropium-albuterol  3 mL Nebulization BID   losartan  25 mg Oral Daily   mometasone-formoterol  2 puff Inhalation BID   montelukast  10 mg Oral Q1500   sodium chloride flush  3 mL Intravenous Q12H   spironolactone  25 mg Oral Daily   Continuous Infusions:  sodium chloride       LOS: 5 days    Time spent:    Zannie Cove,  MD [facility]   04/27/2022, 9:10 PM

## 2022-04-28 DIAGNOSIS — I5043 Acute on chronic combined systolic (congestive) and diastolic (congestive) heart failure: Secondary | ICD-10-CM

## 2022-04-28 LAB — BASIC METABOLIC PANEL
Anion gap: 9 (ref 5–15)
BUN: 8 mg/dL (ref 8–23)
CO2: 33 mmol/L — ABNORMAL HIGH (ref 22–32)
Calcium: 8.4 mg/dL — ABNORMAL LOW (ref 8.9–10.3)
Chloride: 94 mmol/L — ABNORMAL LOW (ref 98–111)
Creatinine, Ser: 0.59 mg/dL (ref 0.44–1.00)
GFR, Estimated: >60 mL/min (ref >60)
Glucose, Bld: 130 mg/dL — ABNORMAL HIGH (ref 70–99)
Potassium: 2.5 mmol/L — CL (ref 3.5–5.1)
Sodium: 136 mmol/L (ref 135–145)

## 2022-04-28 LAB — GLUCOSE, CAPILLARY
Glucose-Capillary: 171 mg/dL — ABNORMAL HIGH (ref 70–99)
Glucose-Capillary: 251 mg/dL — ABNORMAL HIGH (ref 70–99)
Glucose-Capillary: 191 mg/dL — ABNORMAL HIGH (ref 70–99)
Glucose-Capillary: 258 mg/dL — ABNORMAL HIGH (ref 70–99)

## 2022-04-28 LAB — CBC
HCT: 32.4 % — ABNORMAL LOW (ref 36.0–46.0)
Hemoglobin: 10.2 g/dL — ABNORMAL LOW (ref 12.0–15.0)
MCH: 31.1 pg (ref 26.0–34.0)
MCHC: 31.5 g/dL (ref 30.0–36.0)
MCV: 98.8 fL (ref 80.0–100.0)
Platelets: 156 10*3/uL (ref 150–400)
RBC: 3.28 MIL/uL — ABNORMAL LOW (ref 3.87–5.11)
RDW: 12.3 % (ref 11.5–15.5)
WBC: 11 10*3/uL — ABNORMAL HIGH (ref 4.0–10.5)
nRBC: 0 % (ref 0.0–0.2)

## 2022-04-28 LAB — BASIC METABOLIC PANEL WITH GFR
Anion gap: 7 (ref 5–15)
BUN: 9 mg/dL (ref 8–23)
CO2: 33 mmol/L — ABNORMAL HIGH (ref 22–32)
Calcium: 8.8 mg/dL — ABNORMAL LOW (ref 8.9–10.3)
Chloride: 97 mmol/L — ABNORMAL LOW (ref 98–111)
Creatinine, Ser: 0.74 mg/dL (ref 0.44–1.00)
GFR, Estimated: >60 mL/min
Glucose, Bld: 225 mg/dL — ABNORMAL HIGH (ref 70–99)
Potassium: 4 mmol/L (ref 3.5–5.1)
Sodium: 137 mmol/L (ref 135–145)

## 2022-04-28 LAB — MAGNESIUM: Magnesium: 1.9 mg/dL (ref 1.7–2.4)

## 2022-04-28 MED ORDER — ESCITALOPRAM OXALATE 10 MG PO TABS
5.0000 mg | ORAL_TABLET | Freq: Every day | ORAL | Status: DC
  Administered 2022-04-28 – 2022-04-29 (×2): 5 mg via ORAL
  Filled 2022-04-28 (×2): qty 1

## 2022-04-28 MED ORDER — EMPAGLIFLOZIN 10 MG PO TABS
10.0000 mg | ORAL_TABLET | Freq: Every day | ORAL | Status: DC
  Administered 2022-04-28 – 2022-04-29 (×2): 10 mg via ORAL
  Filled 2022-04-28 (×2): qty 1

## 2022-04-28 MED ORDER — POTASSIUM CHLORIDE CRYS ER 20 MEQ PO TBCR
40.0000 meq | EXTENDED_RELEASE_TABLET | ORAL | Status: DC
  Administered 2022-04-28: 40 meq via ORAL
  Filled 2022-04-28: qty 2

## 2022-04-28 MED ORDER — POTASSIUM CHLORIDE 10 MEQ/100ML IV SOLN
10.0000 meq | INTRAVENOUS | Status: AC
  Administered 2022-04-28 (×2): 10 meq via INTRAVENOUS
  Filled 2022-04-28 (×2): qty 100

## 2022-04-28 MED ORDER — ACETAZOLAMIDE 250 MG PO TABS
250.0000 mg | ORAL_TABLET | Freq: Every day | ORAL | Status: DC
  Administered 2022-04-28 – 2022-04-29 (×2): 250 mg via ORAL
  Filled 2022-04-28 (×2): qty 1

## 2022-04-28 MED ORDER — POTASSIUM CHLORIDE CRYS ER 20 MEQ PO TBCR
40.0000 meq | EXTENDED_RELEASE_TABLET | ORAL | Status: AC
  Administered 2022-04-28: 40 meq via ORAL
  Filled 2022-04-28: qty 2

## 2022-04-28 NOTE — Progress Notes (Signed)
   04/28/22 1100  Mobility  Activity Ambulated independently in room  Level of Assistance Moderate assist, patient does 50-74%  Assistive Device Front wheel walker  Distance Ambulated (ft) 15 ft  Activity Response Tolerated well  Mobility Referral Yes  $Mobility charge 1 Mobility   Mobility Specialist Progress Note  Post-Mobility: 81 HR, 108/60 BP, 99% SpO2  Pt was in bed and agreeable. Ambulation cut short d/t c/o dizziness. Returned to bed w/ all needs met and call bell in reach. RN notified.   Anastasia Pall Mobility Specialist  Please contact via SecureChat or Rehab office at [phone]

## 2022-04-29 DIAGNOSIS — I5043 Acute on chronic combined systolic (congestive) and diastolic (congestive) heart failure: Secondary | ICD-10-CM | POA: Diagnosis not present

## 2022-04-29 LAB — BASIC METABOLIC PANEL WITH GFR
Anion gap: 7 (ref 5–15)
BUN: 8 mg/dL (ref 8–23)
CO2: 30 mmol/L (ref 22–32)
Calcium: 8.8 mg/dL — ABNORMAL LOW (ref 8.9–10.3)
Chloride: 98 mmol/L (ref 98–111)
Creatinine, Ser: 0.76 mg/dL (ref 0.44–1.00)
GFR, Estimated: >60 mL/min (ref >60)
Glucose, Bld: 213 mg/dL — ABNORMAL HIGH (ref 70–99)
Potassium: 4.1 mmol/L (ref 3.5–5.1)
Sodium: 135 mmol/L (ref 135–145)

## 2022-04-29 LAB — GLUCOSE, CAPILLARY
Glucose-Capillary: 182 mg/dL — ABNORMAL HIGH (ref 70–99)
Glucose-Capillary: 267 mg/dL — ABNORMAL HIGH (ref 70–99)
Glucose-Capillary: 138 mg/dL — ABNORMAL HIGH (ref 70–99)

## 2022-04-29 MED ORDER — ACETAMINOPHEN ER 650 MG PO TBCR: 650.0000 mg | EXTENDED_RELEASE_TABLET | Freq: Three times a day (TID) | ORAL | Status: DC | PRN

## 2022-04-29 MED ORDER — PREDNISONE 10 MG PO TABS: 10.0000 mg | ORAL_TABLET | Freq: Every day | ORAL | Status: DC

## 2022-04-29 NOTE — Progress Notes (Signed)
Physical Therapy Treatment Patient Details Name: Charlene Houston MRN: [id number] DOB: November 04, 1945 Today's Date: 04/29/2022   History of Present Illness Pt is a 77 y.o. female admitted 04/22/22 after being found unresponsive. Workup for acute metabolic encephalopathy, acute on chronic hypercapnic respiratory, COPD exacerbation. Head CT negative for acute injury. Pt also with L buttock stage 2 pressure ulcer. PMH includes lung CA, breast CA, HTN, OSA, DM, arthritis, obesity, CHF, COPD.    PT Comments    Pt seen for PT tx with pt agreeable with encouragement for participation. Pt is able to complete bed mobility with cuing for sequencing & reliance on HOB elevated & bed rails. Pt is able to transfer STS from elevated EOB with min assist & requires MAX encouragement to ambulate around bed to recliner but does so with min assist & RW. Pt endorses fatigue after short distance gait but does engage in BLE strengthening exercises with cuing for technique. Continue to recommend SNF rehab upon d/c to maximize independence with mobility & decrease fall risk.    Recommendations for follow up therapy are one component of a multi-disciplinary discharge planning process, led by the attending physician.  Recommendations may be updated based on patient status, additional functional criteria and insurance authorization.  Follow Up Recommendations  Skilled nursing-short term rehab (<3 hours/day) Can patient physically be transported by private vehicle: Yes   Assistance Recommended at Discharge Frequent or constant Supervision/Assistance  Patient can return home with the following A little help with walking and/or transfers;A little help with bathing/dressing/bathroom;Assistance with cooking/housework;Direct supervision/assist for medications management;Direct supervision/assist for financial management;Assist for transportation;Help with stairs or ramp for entrance   Equipment Recommendations  None recommended  by PT (TBD in next venue)    Recommendations for Other Services       Precautions / Restrictions Precautions Precautions: Fall;Other (comment) Precaution Comments: watch SpO2 (wears 2L O2 baseline; reports increasing to 4-5L with activity at home); urine incontinence Restrictions Weight Bearing Restrictions: No     Mobility  Bed Mobility Overal bed mobility: Needs Assistance Bed Mobility: Supine to Sit     Supine to sit: Mod assist, HOB elevated          Transfers Overall transfer level: Needs assistance Equipment used: Rolling walker (2 wheels) Transfers: Sit to/from Stand Sit to Stand: Min assist           General transfer comment: STS from significantly elevated EOB    Ambulation/Gait Ambulation/Gait assistance: Min assist Gait Distance (Feet): 10 Feet Assistive device: Rolling walker (2 wheels) Gait Pattern/deviations: Decreased step length - right, Decreased step length - left, Decreased stride length, Decreased dorsiflexion - right, Decreased dorsiflexion - left Gait velocity: decreased     General Gait Details: Pt with flexed trunk, pushes RW slightly out in front of her.   Stairs             Wheelchair Mobility    Modified Rankin (Stroke Patients Only)       Balance Overall balance assessment: Needs assistance Sitting-balance support: No upper extremity supported, Feet supported Sitting balance-Leahy Scale: Fair Sitting balance - Comments: supervision static sitting   Standing balance support: During functional activity, Bilateral upper extremity supported, Reliant on assistive device for balance Standing balance-Leahy Scale: Fair                              Cognition Arousal/Alertness: Awake/alert Behavior During Therapy: Flat affect Overall Cognitive Status: No family/caregiver present  to determine baseline cognitive functioning Area of Impairment: Orientation, Attention, Memory, Safety/judgement, Following commands,  Awareness, Problem solving                 Orientation Level: Disoriented to, Time, Situation (age)     Following Commands: Follows one step commands consistently, Follows one step commands with increased time Safety/Judgement: Decreased awareness of safety, Decreased awareness of deficits Awareness: Anticipatory, Emergent Problem Solving: Decreased initiation, Requires verbal cues          Exercises General Exercises - Lower Extremity Long Arc Quad: AROM, Strengthening, Both, 10 reps, Seated Hip Flexion/Marching: AROM, Strengthening, Seated, Both, 10 reps    General Comments General comments (skin integrity, edema, etc.): Pt on 2L/min via nasal cannula throughout session, SpO2 >90%      Pertinent Vitals/Pain Pain Assessment Pain Assessment: Faces Faces Pain Scale: Hurts a little bit Pain Location: buttocks Pain Descriptors / Indicators: Discomfort Pain Intervention(s): Monitored during session, Repositioned    Home Living                          Prior Function            PT Goals (current goals can now be found in the care plan section) Acute Rehab PT Goals Patient Stated Goal: return home, be able to climb stairs to bedroom PT Goal Formulation: With patient Time For Goal Achievement: 05/08/22 Potential to Achieve Goals: Fair Progress towards PT goals: Progressing toward goals    Frequency    Min 2X/week      PT Plan Current plan remains appropriate    Co-evaluation              AM-PAC PT "6 Clicks" Mobility   Outcome Measure  Help needed turning from your back to your side while in a flat bed without using bedrails?: A Little Help needed moving from lying on your back to sitting on the side of a flat bed without using bedrails?: A Lot Help needed moving to and from a bed to a chair (including a wheelchair)?: A Little Help needed standing up from a chair using your arms (e.g., wheelchair or bedside chair)?: A Little Help needed  to walk in hospital room?: A Little Help needed climbing 3-5 steps with a railing? : Total 6 Click Score: 15    End of Session Equipment Utilized During Treatment: Oxygen Activity Tolerance: Patient tolerated treatment well;Patient limited by fatigue Patient left: in chair;with chair alarm set;with call bell/phone within reach Nurse Communication: Mobility status PT Visit Diagnosis: Other abnormalities of gait and mobility (R26.89);Muscle weakness (generalized) (M62.81);Difficulty in walking, not elsewhere classified (R26.2)     Time: 9495-5065 PT Time Calculation (min) (ACUTE ONLY): 25 min  Charges:  $Therapeutic Activity: 23-37 mins                     Aleda Grana, PT, DPT 04/29/22, 10:31 AM   Sandi Mariscal 04/29/2022, 10:30 AM

## 2022-04-29 NOTE — Progress Notes (Signed)
[facility] picked up pt.  Discharged to [place] facility.

## 2022-04-29 NOTE — Progress Notes (Signed)
Report called to [facility] facility. Cheri RN received report.

## 2022-04-29 NOTE — Care Management Important Message (Signed)
Important Message  Patient Details  Name: Charlene Houston MRN: [id number] Date of Birth: Aug 13, 1945   Medicare Important Message Given:  Yes     Renie Ora 04/29/2022, 9:58 AM

## 2022-04-29 NOTE — TOC Transition Note (Signed)
Transition of Care [facility]) - CM/SW Discharge Note   Patient Details  Name: Charlene Houston MRN: [id number] Date of Birth: July 26, 1945  Transition of Care [facility]) CM/SW Contact:  Leander Rams, LCSW Phone Number: 04/29/2022, 1:33 PM   Clinical Narrative:    Patient will DC to: [facility] SNF Anticipated DC date: 04/29/2022 Family notified: Yes. Lamar Laundry (daughter) Transport by: Sharin Mons   Per MD patient ready for DC to [facility]. RN, patient, patient's family, and facility notified of DC. Discharge Summary and FL2 sent to facility. RN to call report prior to discharge [phone]. DC packet on chart. Ambulance transport requested for patient.   CSW will sign off for now as social work intervention is no longer needed. Please consult [place] again if new needs arise.    Final next level of care: Skilled Nursing Facility Barriers to Discharge: No Barriers Identified   Patient Goals and CMS Choice      Discharge Placement                Patient chooses bed at: [facility] Patient to be transferred to facility by: [facility] Name of family member notified: Sonya(daughter) Patient and family notified of of transfer: 04/29/22  Discharge Plan and Services Additional resources added to the After Visit Summary for                                       Social Determinants of Health (SDOH) Interventions SDOH Screenings   Food Insecurity: No Food Insecurity (01/04/2022)  Housing: Low Risk  (01/04/2022)  Transportation Needs: No Transportation Needs (01/04/2022)  Utilities: Not At Risk (01/04/2022)  Alcohol Screen: Low Risk  (01/04/2022)  Financial Resource Strain: Low Risk  (01/04/2022)  Tobacco Use: Medium Risk (03/26/2022)     Readmission Risk Interventions     No data to display          Oletta Lamas, MSW, LCSWA, LCASA Transitions of Care  Clinical Social Worker I

## 2022-04-29 NOTE — Progress Notes (Signed)
   04/29/22 1200  Mobility  Activity Stood at bedside  Level of Assistance Moderate assist, patient does 50-74%  Assistive Device Front wheel walker  Activity Response Tolerated well  Mobility Referral Yes  $Mobility charge 1 Mobility   Mobility Specialist Progress Note  Pt in chair and agreeable. Was able to stand but pt refused mobility d/t fatigue. Returned to chair w/ all needs met and call bell in reach.   Anastasia Pall Mobility Specialist  Please contact via SecureChat or Rehab office at [phone]

## 2022-04-29 NOTE — Discharge Summary (Signed)
Physician Discharge Summary  Diahann Guajardo DGS:[id number] DOB: Sep 21, 1945 DOA: 04/22/2022  PCP: Malka So., MD  Admit date: 04/22/2022 Discharge date: 04/29/2022  Time spent: 35 minutes  Recommendations for Outpatient Follow-up:  PCP in 1 week SNF for short-term rehab, continue BiPAP 15/5 nightly Heart failure clinic Dr.Sabharwal in 2 weeks   Discharge Diagnoses:  Acute metabolic encephalopathy   Respiratory failure with hypoxia and hypercapnia (HCC)   Acute on chronic systolic CHF (congestive heart failure) (HCC)   Essential hypertension   Hypokalemia   Type 2 diabetes mellitus (HCC)   Pressure injury of skin   Non-small cell lung cancer (HCC)   Class 1 obesity   Discharge Condition: Improved  Diet recommendation: Low-sodium, diet headache, heart healthy  Filed Weights   04/26/22 0028 04/27/22 0443 04/28/22 0443  Weight: 91.2 kg 90.5 kg 90.4 kg    History of present illness:  77/F w/F with CHF, chronic respiratory failure, hypoxemic, hypertension, non small cell lung cancer. Admitted w/ confusion,  disorientation, generalized weakness and somnolence. Outpatient evaluation primidone was discontinued and was supposed to start topamax. On the day of admission she was found unresponsive and was transferred to the ED. In ED, confused w/ rales and increase work of breathing,  left buttock stage 2 pressure ulcer, no lower extremity edema. BNP 1,996, Wbc 12.8 hgb 9,7 Head CT with no acute changes.  CXR w/cardiomegaly, bilateral hilar vascular congestion.    Hospital Course:   Acute hypoxic and Hypercarbic Respiratory failure COPD exacerbation Obesity hypoventilation syndrome.  -Continue BIPAP Q nap and QHS, on 2 L home O2 at baseline -Improved with IV Lasix, BiPAP, duoneb, pulm toilet -She will need a home Bipap. -Discharge planning, SNF for short-term rehab   Acute metabolic encephalopathy, multifactorial, including respiratory failure, polypharmacy.   -Neurontin discontinued -mentation continues to improve.  Will restart Lexapro   Acute on chronic systolic CHF (congestive heart failure) (HCC) -Echo EF 30 to 35%, global hypokinesis, mild LVH. RV systolic function with preserved. No significant valvular disease.  -Diuresed briefly with IV Lasix, I/os inaccurate -Continue oral Lasix, Aldactone, acetazolamide, carvedilol and losartan  -Restarted Jardiance -Follow-up with Dr. Gasper Lloyd   Hypokalemia Hypomagnesemia.  -replaced   Type 2 diabetes mellitus (HCC) -Continue basal insulin.  Systemic steroids have been discontinued.  -Restarted Jardiance, glimepiride   Pressure injury of skin-poa Continue local skin care.    Non-small cell lung cancer (HCC) Follow up as outpatient    Class 1 obesity Calculated BMI is 33,2  OSA  Discharge Exam: Vitals:   04/29/22 0741 04/29/22 0903  BP: (!) 101/56   Pulse:    Resp: 15   Temp: 98.8 F (37.1 C)   SpO2: 100% 99%   Pleasant chronically ill elderly female sitting up in bed, AAOx3 CVS: S1-S2, regular rhythm Lungs: Decreased breath sounds at the bases Abdomen: Soft, nontender, bowel sounds present Extremities: No edema Psych: Appropriate mood and affect  Discharge Instructions   Discharge Instructions     Diet - low sodium heart healthy   Complete by: As directed    Diet Carb Modified   Complete by: As directed    Discharge wound care:   Complete by: As directed    routine   Increase activity slowly   Complete by: As directed       Allergies as of 04/29/2022       Reactions   Prozac [fluoxetine Hcl] Other (See Comments)   Gittery   Vit D-vit E-safflower Oil Other (See Comments)  Not specified        Medication List     STOP taking these medications    gabapentin 300 MG capsule Commonly known as: NEURONTIN   losartan 25 MG tablet Commonly known as: COZAAR   mirtazapine 15 MG tablet Commonly known as: REMERON       TAKE these medications     acetaminophen 650 MG CR tablet Commonly known as: TYLENOL Take 1 tablet (650 mg total) by mouth every 8 (eight) hours as needed for pain. What changed: how much to take   albuterol 1.25 MG/3ML nebulizer solution Commonly known as: ACCUNEB Take 3 mLs by nebulization every 6 (six) hours as needed for wheezing.   carboxymethylcellulose 0.5 % Soln Commonly known as: REFRESH PLUS Place 1 drop into both eyes daily as needed (dry eyes).   carvedilol 3.125 MG tablet Commonly known as: COREG Take 1 tablet (3.125 mg total) by mouth 2 (two) times daily.   cyanocobalamin 1000 MCG/ML injection Commonly known as: VITAMIN B12 Inject 1,000 mcg into the muscle every 30 (thirty) days.   desonide 0.05 % cream Commonly known as: DESOWEN Apply 1 Application topically as needed.   empagliflozin 10 MG Tabs tablet Commonly known as: JARDIANCE Take 1 tablet (10 mg total) by mouth daily.   Ergocalciferol 10 MCG (400 UNIT) Tabs Take 800-1,200 Units by mouth See admin instructions. Taking 2 tabs ( 800 units) on Sunday and 3 tabs (1200) units daily except Sunday.   escitalopram 5 MG tablet Commonly known as: LEXAPRO Take 5 mg by mouth daily.   folic acid 1 MG tablet Commonly known as: FOLVITE Take 1 mg by mouth daily.   furosemide 40 MG tablet Commonly known as: LASIX Take 1 tablet (40 mg total) by mouth 2 (two) times daily. What changed: Another medication with the same name was removed. Continue taking this medication, and follow the directions you see here.   glimepiride 2 MG tablet Commonly known as: AMARYL Take 2 mg by mouth daily.   montelukast 10 MG tablet Commonly known as: SINGULAIR Take 10 mg by mouth daily in the afternoon.   potassium chloride SA 20 MEQ tablet Commonly known as: KLOR-CON M Take 1 tablet (20 mEq total) by mouth daily.   predniSONE 10 MG tablet Commonly known as: DELTASONE Take 1 tablet (10 mg total) by mouth daily with breakfast. What changed:   medication strength how much to take   simvastatin 40 MG tablet Commonly known as: ZOCOR Take 40 mg by mouth daily.   spironolactone 25 MG tablet Commonly known as: ALDACTONE Take 1 tablet (25 mg total) by mouth daily.   witch hazel-glycerin pad Commonly known as: TUCKS Apply 1 Application topically as needed for itching.               Discharge Care Instructions  (From admission, onward)           Start     Ordered   04/29/22 0000  Discharge wound care:       Comments: routine   04/29/22 0841           Allergies  Allergen Reactions   Prozac [Fluoxetine Hcl] Other (See Comments)    Gittery   Vit D-Vit E-Safflower Oil Other (See Comments)    Not specified      The results of significant diagnostics from this hospitalization (including imaging, microbiology, ancillary and laboratory) are listed below for reference.    Significant Diagnostic Studies: ECHOCARDIOGRAM COMPLETE  Result Date: 04/23/2022  ECHOCARDIOGRAM REPORT   Patient Name:   Letzy Gullickson Date of Exam: 04/23/2022 Medical Rec #:  [id number]               Height:       65.0 in Accession #:    [id number]              Weight:       199.5 lb Date of Birth:  1946/01/26               BSA:          1.976 m Patient Age:    45 years                BP:           93/50 mmHg Patient Gender: F                       HR:           79 bpm. Exam Location:  Inpatient Procedure: 2D Echo, Cardiac Doppler and Color Doppler Indications:    CHF-acute systolic  History:        Patient has prior history of Echocardiogram examinations, most                 recent 01/02/2022. Risk Factors:Hypertension. GERD.  Sonographer:    Clayton Lefort RDCS (AE) Referring Phys: Lequita Halt  Sonographer Comments: Technically difficult study due to poor echo windows and patient is obese. Image acquisition challenging due to patient body habitus. On BiPap IMPRESSIONS  1. Prominent LV dyssynchrony due to LBBB. Left ventricular ejection  fraction, by estimation, is 30 to 35%. The left ventricle has moderately decreased function. The left ventricle demonstrates global hypokinesis. There is mild concentric left ventricular hypertrophy. Indeterminate diastolic filling due to E-A fusion.  2. Right ventricular systolic function is normal. The right ventricular size is normal. Tricuspid regurgitation signal is inadequate for assessing PA pressure.  3. The mitral valve is grossly normal. Mild mitral valve regurgitation. No evidence of mitral stenosis.  4. The aortic valve is grossly normal. Aortic valve regurgitation is not visualized. No aortic stenosis is present. Comparison(s): No significant change from prior study. EF 30-35% and was 35-40% on the prior, likely non-significant change. FINDINGS  Left Ventricle: Prominent LV dyssynchrony due to LBBB. Left ventricular ejection fraction, by estimation, is 30 to 35%. The left ventricle has moderately decreased function. The left ventricle demonstrates global hypokinesis. Definity contrast agent was  given IV to delineate the left ventricular endocardial borders. The left ventricular internal cavity size was normal in size. There is mild concentric left ventricular hypertrophy. Abnormal (paradoxical) septal motion, consistent with left bundle branch  block. Indeterminate diastolic filling due to E-A fusion. Right Ventricle: The right ventricular size is normal. No increase in right ventricular wall thickness. Right ventricular systolic function is normal. Tricuspid regurgitation signal is inadequate for assessing PA pressure. Left Atrium: Left atrial size was normal in size. Right Atrium: Right atrial size was normal in size. Pericardium: There is no evidence of pericardial effusion. Mitral Valve: The mitral valve is grossly normal. Mild mitral valve regurgitation. No evidence of mitral valve stenosis. Tricuspid Valve: The tricuspid valve is grossly normal. Tricuspid valve regurgitation is trivial. No  evidence of tricuspid stenosis. Aortic Valve: The aortic valve is grossly normal. Aortic valve regurgitation is not visualized. No aortic stenosis is present. Aortic valve mean gradient measures 2.0 mmHg. Aortic valve peak gradient measures  4.5 mmHg. Aortic valve area, by VTI measures 2.51 cm. Pulmonic Valve: The pulmonic valve was grossly normal. Pulmonic valve regurgitation is trivial. No evidence of pulmonic stenosis. Aorta: The aortic root is normal in size and structure. Venous: IVC assessment for right atrial pressure unable to be performed due to mechanical ventilation. IAS/Shunts: The atrial septum is grossly normal.  LEFT VENTRICLE PLAX 2D LVIDd:         5.10 cm LVIDs:         4.70 cm LV PW:         1.30 cm LV IVS:        1.30 cm LVOT diam:     2.00 cm LV SV:         53 LV SV Index:   27 LVOT Area:     3.14 cm  RIGHT VENTRICLE             IVC RV Basal diam:  3.10 cm     IVC diam: 2.60 cm RV S prime:     10.80 cm/s TAPSE (M-mode): 3.0 cm LEFT ATRIUM             Index        RIGHT ATRIUM           Index LA diam:        3.20 cm 1.62 cm/m   RA Area:     17.60 cm LA Vol (A2C):   41.5 ml 21.00 ml/m  RA Volume:   55.10 ml  27.88 ml/m LA Vol (A4C):   46.5 ml 23.53 ml/m LA Biplane Vol: 48.9 ml 24.75 ml/m  AORTIC VALVE AV Area (Vmax):    2.64 cm AV Area (Vmean):   2.51 cm AV Area (VTI):     2.51 cm AV Vmax:           106.00 cm/s AV Vmean:          69.900 cm/s AV VTI:            0.210 m AV Peak Grad:      4.5 mmHg AV Mean Grad:      2.0 mmHg LVOT Vmax:         89.20 cm/s LVOT Vmean:        55.800 cm/s LVOT VTI:          0.168 m LVOT/AV VTI ratio: 0.80  AORTA Ao Root diam: 2.70 cm Ao Asc diam:  3.40 cm  SHUNTS Systemic VTI:  0.17 m Systemic Diam: 2.00 cm Eleonore Chiquito MD Electronically signed by Eleonore Chiquito MD Signature Date/Time: 04/23/2022/5:31:12 PM    Final    DG Chest 1 View  Result Date: 04/23/2022 CLINICAL DATA:  77 year old female with congestive heart failure. Shortness of breath. EXAM: CHEST   1 VIEW COMPARISON:  Portable chest 04/22/2022 and earlier. FINDINGS: Portable AP semi upright view at 0705 hours. Stable right chest Port-A-Cath. Stable lung volumes. Stable cardiomegaly and mediastinal contours. Left lung base hypo ventilation with obscured hemidiaphragm again noted. Pulmonary vascularity within normal limits, no overt edema. No pneumothorax. No consolidation. Paucity of bowel gas in the visible abdomen. No acute osseous abnormality identified. IMPRESSION: Stable cardiomegaly and ongoing left lung base hypoventilation which may reflect a combination of pleural effusion and atelectasis or consolidation. No overt edema. No new new cardiopulmonary abnormality. Electronically Signed   By: Genevie Ann M.D.   On: 04/23/2022 07:14   CT Head Wo Contrast  Result Date: 04/22/2022 CLINICAL DATA:  Altered mental status  EXAM: CT HEAD WITHOUT CONTRAST TECHNIQUE: Contiguous axial images were obtained from the base of the skull through the vertex without intravenous contrast. RADIATION DOSE REDUCTION: This exam was performed according to the departmental dose-optimization program which includes automated exposure control, adjustment of the mA and/or kV according to patient size and/or use of iterative reconstruction technique. COMPARISON:  07/05/2021 FINDINGS: Brain: No acute intracranial findings are seen. There are no signs of bleeding within the cranium. Cortical sulci are prominent. There is decreased density in periventricular white matter. There is no focal edema or mass effect. Vascular: Unremarkable. Skull: Unremarkable. Sinuses/Orbits: There are no air-fluid levels or significant mucosal thickening in the sinuses. Orbits are unremarkable. There is fluid density in few of the left mastoid air cells. There is decrease in number of right mastoid air cells. 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 This finding has not changed significantly. Other: None. IMPRESSION: No  acute intracranial findings are seen in noncontrast CT brain. Atrophy. Small-vessel disease. Left mastoid effusion. Electronically Signed   By: Ernie Avena M.D.   On: 04/22/2022 15:52   DG Chest Port 1 View  Result Date: 04/22/2022 CLINICAL DATA:  Weakness EXAM: PORTABLE CHEST 1 VIEW COMPARISON:  02/20/2022, chest CT 01/01/2022 FINDINGS: Right-sided central venous port tip at the right atrium. Cardiomegaly with vascular congestion and probable mild pulmonary edema. Patchy airspace opacities in the upper lobes and left lung base. No pneumothorax. IMPRESSION: Cardiomegaly with vascular congestion and probable mild pulmonary edema. Patchy airspace disease in the upper lobes and left lung base may be due to edema or superimposed pneumonia. Electronically Signed   By: Jasmine Pang M.D.   On: 04/22/2022 15:43    Microbiology: Recent Results (from the past 240 hour(s))  Resp panel by RT-PCR (RSV, Flu A&B, Covid) Anterior Nasal Swab     Status: None   Collection Time: 04/22/22  3:22 PM   Specimen: Anterior Nasal Swab  Result Value Ref Range Status   SARS Coronavirus 2 by RT PCR NEGATIVE NEGATIVE Final    Comment: (NOTE) SARS-CoV-2 target nucleic acids are NOT DETECTED.  The SARS-CoV-2 RNA is generally detectable in upper respiratory specimens during the acute phase of infection. The lowest concentration of SARS-CoV-2 viral copies this assay can detect is 138 copies/mL. A negative result does not preclude SARS-Cov-2 infection and should not be used as the sole basis for treatment or other patient management decisions. A negative result may occur with  improper specimen collection/handling, submission of specimen other than nasopharyngeal swab, presence of viral mutation(s) within the areas targeted by this assay, and inadequate number of viral copies(<138 copies/mL). A negative result must be combined with clinical observations, patient history, and epidemiological information. The  expected result is Negative.  Fact Sheet for Patients:  BloggerCourse.com  Fact Sheet for Healthcare Providers:  SeriousBroker.it  This test is no t yet approved or cleared by the [place] FDA and  has been authorized for detection and/or diagnosis of SARS-CoV-2 by FDA under an Emergency Use Authorization (EUA). This EUA will remain  in effect (meaning this test can be used) for the duration of the COVID-19 declaration under Section 564(b)(1) of the Act, 21 U.S.C.section 360bbb-3(b)(1), unless the authorization is terminated  or revoked sooner.       Influenza A by PCR NEGATIVE NEGATIVE Final   Influenza B by PCR NEGATIVE NEGATIVE Final    Comment: (NOTE) The Xpert Xpress SARS-CoV-2/FLU/RSV plus assay is intended as an aid in  the diagnosis of influenza from Nasopharyngeal swab specimens and should not be used as a sole basis for treatment. Nasal washings and aspirates are unacceptable for Xpert Xpress SARS-CoV-2/FLU/RSV testing.  Fact Sheet for Patients: BloggerCourse.com  Fact Sheet for Healthcare Providers: SeriousBroker.it  This test is not yet approved or cleared by the [place] FDA and has been authorized for detection and/or diagnosis of SARS-CoV-2 by FDA under an Emergency Use Authorization (EUA). This EUA will remain in effect (meaning this test can be used) for the duration of the COVID-19 declaration under Section 564(b)(1) of the Act, 21 U.S.C. section 360bbb-3(b)(1), unless the authorization is terminated or revoked.     Resp Syncytial Virus by PCR NEGATIVE NEGATIVE Final    Comment: (NOTE) Fact Sheet for Patients: BloggerCourse.com  Fact Sheet for Healthcare Providers: SeriousBroker.it  This test is not yet approved or cleared by the [place] FDA and has been authorized for detection and/or  diagnosis of SARS-CoV-2 by FDA under an Emergency Use Authorization (EUA). This EUA will remain in effect (meaning this test can be used) for the duration of the COVID-19 declaration under Section 564(b)(1) of the Act, 21 U.S.C. section 360bbb-3(b)(1), unless the authorization is terminated or revoked.  Performed at [facility], [address] [place], [place] [address]   C Difficile Quick Screen w PCR reflex     Status: None   Collection Time: 04/22/22  7:51 PM   Specimen: STOOL  Result Value Ref Range Status   C Diff antigen NEGATIVE NEGATIVE Final   C Diff toxin NEGATIVE NEGATIVE Final   C Diff interpretation No C. difficile detected.  Final    Comment: Performed at [facility], [address] [place], [place] [address]     Labs: Basic Metabolic Panel: Recent Labs  Lab 04/22/22 1814 04/22/22 1822 04/25/22 0045 04/25/22 8001 04/26/22 0032 04/27/22 0101 04/28/22 0051 04/28/22 0827 04/29/22 0054  NA  --    < > 135   < > 133* 135 136 137 135  K 5.7*   < > 2.4*   < > 3.1* 3.3* 2.5* 4.0 4.1  CL  --    < > 81*   < > 87* 93* 94* 97* 98  CO2  --    < > 42*   < > 37* 32 33* 33* 30  GLUCOSE  --    < > 271*   < > 101* 234* 130* 225* 213*  BUN  --    < > 10   < > 8 9 8 9 8   CREATININE  --    < > 0.83   < > 0.68 0.69 0.59 0.74 0.76  CALCIUM  --    < > 8.3*   < > 8.6* 8.4* 8.4* 8.8* 8.8*  MG 1.8  --  1.6*  --  1.9  --   --  1.9  --    < > = values in this interval not displayed.   Liver Function Tests: Recent Labs  Lab 04/22/22 1537  AST 56*  ALT 36  ALKPHOS 63  BILITOT 1.0  PROT 5.9*  ALBUMIN 2.6*   No results for input(s): "LIPASE", "AMYLASE" in the last 168 hours. Recent Labs  Lab 04/22/22 1814  AMMONIA 43*   CBC: Recent Labs  Lab 04/22/22 1537 04/22/22 1822 04/28/22 0051  WBC 12.8*  --  11.0*  HGB 9.7* 11.2* 10.2*  HCT 32.2* 33.0* 32.4*  MCV 104.9*  --  98.8  PLT 159  --  156   Cardiac Enzymes: No results for input(s): "CKTOTAL",  "CKMB", "CKMBINDEX", "TROPONINI" in the last 168 hours. BNP: BNP (last 3 results) Recent Labs    02/04/22 1159 03/26/22 1021 04/22/22 1538  BNP 958.0* 574.5* 1,996.0*    ProBNP (last 3 results) No results for input(s): "PROBNP" in the last 8760 hours.  CBG: Recent Labs  Lab 04/28/22 0603 04/28/22 1129 04/28/22 1612 04/28/22 2110 04/29/22 0606  GLUCAP 171* 251* 191* 258* 182*   Signed:  Zannie Cove MD.  [facility] 04/29/2022, 9:50 AM

## 2022-04-29 NOTE — Progress Notes (Signed)
Patient in no respiratory distress at his time. BiPAP is on standby, not needed.

## 2022-04-29 NOTE — Progress Notes (Signed)
Occupational Therapy Treatment Patient Details Name: Charlene Houston MRN: [id number] DOB: [birth year] Today's Date: 04/29/2022   History of present illness Pt is a 77 y.o. female admitted 04/22/22 after being found unresponsive. Workup for acute metabolic encephalopathy, acute on chronic hypercapnic respiratory, COPD exacerbation. Head CT negative for acute injury. Pt also with L buttock stage 2 pressure ulcer. PMH includes lung CA, breast CA, HTN, OSA, DM, arthritis, obesity, CHF, COPD.   OT comments  Pt assisted to EOB with mod assist,  completed grooming activities at EOB with supervison and stood for pericare from elevated bed with min assist. Returned to supine at end of session.  VSS on 2L O2. Pt continues to demonstrate impaired cognition.    Recommendations for follow up therapy are one component of a multi-disciplinary discharge planning process, led by the attending physician.  Recommendations may be updated based on patient status, additional functional criteria and insurance authorization.    Follow Up Recommendations  Skilled nursing-short term rehab (<3 hours/day)     Assistance Recommended at Discharge Frequent or constant Supervision/Assistance  Patient can return home with the following  A little help with walking and/or transfers;A lot of help with bathing/dressing/bathroom;Assistance with cooking/housework;Direct supervision/assist for medications management;Direct supervision/assist for financial management;Assist for transportation;Help with stairs or ramp for entrance   Equipment Recommendations  Other (comment) (defer to snf)    Recommendations for Other Services      Precautions / Restrictions Precautions Precautions: Fall;Other (comment) Precaution Comments: watch SpO2 (wears 2L O2 baseline; reports increasing to 4-5L with activity at home); urine incontinence       Mobility Bed Mobility Overal bed mobility: Needs Assistance Bed Mobility: Supine to Sit,  Sit to Supine     Supine to sit: Mod assist, HOB elevated Sit to supine: Min assist        Transfers Overall transfer level: Needs assistance Equipment used: Rolling walker (2 wheels) Transfers: Sit to/from Stand Sit to Stand: Min assist           General transfer comment: stood from elevated bed for pericare, changing bed pad     Balance Overall balance assessment: Needs assistance   Sitting balance-Leahy Scale: Fair     Standing balance support: During functional activity, Bilateral upper extremity supported, Reliant on assistive device for balance Standing balance-Leahy Scale: Poor Standing balance comment: reliant on B UE support                           ADL either performed or assessed with clinical judgement   ADL Overall ADL's : Needs assistance/impaired     Grooming: Wash/dry hands;Wash/dry face;Oral care;Sitting;Supervision/safety Grooming Details (indicate cue type and reason): at EOB         Upper Body Dressing : Minimal assistance;Sitting           Toileting- Clothing Manipulation and Hygiene: Maximal assistance;Sit to/from stand              Extremity/Trunk Assessment              Vision       Perception     Praxis      Cognition Arousal/Alertness: Awake/alert Behavior During Therapy: Flat affect Overall Cognitive Status: No family/caregiver present to determine baseline cognitive functioning Area of Impairment: Orientation, Memory, Safety/judgement, Following commands, Awareness, Problem solving                 Orientation Level: Disoriented to, Time, Situation  Memory: Decreased short-term memory Following Commands: Follows one step commands consistently, Follows one step commands with increased time Safety/Judgement: Decreased awareness of safety, Decreased awareness of deficits Awareness: Intellectual Problem Solving: Decreased initiation, Requires verbal cues General Comments: continues to  demonstrate poor insight        Exercises      Shoulder Instructions       General Comments      Pertinent Vitals/ Pain       Pain Assessment Pain Assessment: Faces Faces Pain Scale: Hurts a little bit Pain Location: buttocks Pain Descriptors / Indicators: Discomfort Pain Intervention(s): Monitored during session, Repositioned  Home Living                                          Prior Functioning/Environment              Frequency  Min 2X/week        Progress Toward Goals  OT Goals(current goals can now be found in the care plan section)  Progress towards OT goals: Progressing toward goals  Acute Rehab OT Goals OT Goal Formulation: With patient Time For Goal Achievement: 05/08/22 Potential to Achieve Goals: Good  Plan Discharge plan remains appropriate    Co-evaluation                 AM-PAC OT "6 Clicks" Daily Activity     Outcome Measure   Help from another person eating meals?: None Help from another person taking care of personal grooming?: A Little Help from another person toileting, which includes using toliet, bedpan, or urinal?: A Lot Help from another person bathing (including washing, rinsing, drying)?: A Lot Help from another person to put on and taking off regular upper body clothing?: A Little Help from another person to put on and taking off regular lower body clothing?: A Lot 6 Click Score: 16    End of Session Equipment Utilized During Treatment: Rolling walker (2 wheels);Gait belt  OT Visit Diagnosis: Unsteadiness on feet (R26.81);Other abnormalities of gait and mobility (R26.89);Muscle weakness (generalized) (M62.81)   Activity Tolerance Patient tolerated treatment well   Patient Left in bed;with call bell/phone within reach;with bed alarm set   Nurse Communication          Time: 7989-2119 OT Time Calculation (min): 13 min  Charges: OT General Charges $OT Visit: 1 Visit OT Treatments $Self  Care/Home Management : 8-22 mins  Berna Spare, OTR/L Acute Rehabilitation Services Office: [phone]   Evern Bio 04/29/2022, 3:02 PM

## 2022-05-07 ENCOUNTER — Telehealth (HOSPITAL_COMMUNITY): Payer: Self-pay | Admitting: *Deleted

## 2022-05-07 NOTE — Telephone Encounter (Signed)
Attempted to call patient regarding upcoming cardiac MRI appointment. °Left message on voicemail with name and callback number ° °Rachelann Enloe RN Navigator Cardiac Imaging °[facility] °[phone] Office °[phone] Cell ° °

## 2022-05-10 ENCOUNTER — Ambulatory Visit (HOSPITAL_COMMUNITY): Admission: RE | Admit: 2022-05-10 | Payer: Medicare HMO | Source: Ambulatory Visit

## 2022-05-13 ENCOUNTER — Ambulatory Visit (HOSPITAL_COMMUNITY)
Admission: RE | Admit: 2022-05-13 | Discharge: 2022-05-13 | Disposition: A | Discharge status: Home / Self Care | Payer: Medicare HMO | Source: Ambulatory Visit | Attending: Cardiology | Admitting: Cardiology

## 2022-05-13 ENCOUNTER — Telehealth (HOSPITAL_COMMUNITY): Payer: Self-pay

## 2022-05-13 ENCOUNTER — Encounter (HOSPITAL_COMMUNITY): Payer: Self-pay | Admitting: Cardiology

## 2022-05-13 VITALS — BP 110/70 | HR 77

## 2022-05-13 DIAGNOSIS — I5020 Unspecified systolic (congestive) heart failure: Secondary | ICD-10-CM | POA: Diagnosis present

## 2022-05-13 DIAGNOSIS — C3490 Malignant neoplasm of unspecified part of unspecified bronchus or lung: Secondary | ICD-10-CM | POA: Diagnosis not present

## 2022-05-13 DIAGNOSIS — Z853 Personal history of malignant neoplasm of breast: Secondary | ICD-10-CM | POA: Insufficient documentation

## 2022-05-13 DIAGNOSIS — K219 Gastro-esophageal reflux disease without esophagitis: Secondary | ICD-10-CM | POA: Diagnosis not present

## 2022-05-13 DIAGNOSIS — Z87891 Personal history of nicotine dependence: Secondary | ICD-10-CM | POA: Insufficient documentation

## 2022-05-13 DIAGNOSIS — Z7952 Long term (current) use of systemic steroids: Secondary | ICD-10-CM | POA: Insufficient documentation

## 2022-05-13 DIAGNOSIS — E785 Hyperlipidemia, unspecified: Secondary | ICD-10-CM | POA: Insufficient documentation

## 2022-05-13 DIAGNOSIS — I447 Left bundle-branch block, unspecified: Secondary | ICD-10-CM | POA: Diagnosis not present

## 2022-05-13 DIAGNOSIS — Z7984 Long term (current) use of oral hypoglycemic drugs: Secondary | ICD-10-CM | POA: Insufficient documentation

## 2022-05-13 DIAGNOSIS — G4733 Obstructive sleep apnea (adult) (pediatric): Secondary | ICD-10-CM | POA: Insufficient documentation

## 2022-05-13 DIAGNOSIS — I110 Hypertensive heart disease with heart failure: Secondary | ICD-10-CM | POA: Diagnosis not present

## 2022-05-13 DIAGNOSIS — I5042 Chronic combined systolic (congestive) and diastolic (congestive) heart failure: Secondary | ICD-10-CM

## 2022-05-13 DIAGNOSIS — Z79899 Other long term (current) drug therapy: Secondary | ICD-10-CM | POA: Insufficient documentation

## 2022-05-13 DIAGNOSIS — E1151 Type 2 diabetes mellitus with diabetic peripheral angiopathy without gangrene: Secondary | ICD-10-CM | POA: Diagnosis not present

## 2022-05-13 DIAGNOSIS — C3432 Malignant neoplasm of lower lobe, left bronchus or lung: Secondary | ICD-10-CM | POA: Diagnosis not present

## 2022-05-13 DIAGNOSIS — J984 Other disorders of lung: Secondary | ICD-10-CM

## 2022-05-13 DIAGNOSIS — Z923 Personal history of irradiation: Secondary | ICD-10-CM | POA: Insufficient documentation

## 2022-05-13 DIAGNOSIS — J704 Drug-induced interstitial lung disorders, unspecified: Secondary | ICD-10-CM | POA: Diagnosis not present

## 2022-05-13 DIAGNOSIS — Z9221 Personal history of antineoplastic chemotherapy: Secondary | ICD-10-CM | POA: Insufficient documentation

## 2022-05-13 LAB — BASIC METABOLIC PANEL
Anion gap: 16 — ABNORMAL HIGH (ref 5–15)
BUN: 10 mg/dL (ref 8–23)
CO2: 42 mmol/L — ABNORMAL HIGH (ref 22–32)
Calcium: 8.3 mg/dL — ABNORMAL LOW (ref 8.9–10.3)
Chloride: 79 mmol/L — ABNORMAL LOW (ref 98–111)
Creatinine, Ser: 0.96 mg/dL (ref 0.44–1.00)
GFR, Estimated: >60 mL/min (ref >60)
Glucose, Bld: 282 mg/dL — ABNORMAL HIGH (ref 70–99)
Potassium: 2.7 mmol/L — CL (ref 3.5–5.1)
Sodium: 137 mmol/L (ref 135–145)

## 2022-05-13 LAB — BRAIN NATRIURETIC PEPTIDE: B Natriuretic Peptide: 1208.2 pg/mL — ABNORMAL HIGH (ref 0.0–100.0)

## 2022-05-13 NOTE — Telephone Encounter (Signed)
Charlene Houston with [facility] left vm returning call. Charlene Houston did not leave an alternate number the number she left was [phone]. I called back no answer.

## 2022-05-13 NOTE — Progress Notes (Signed)
ADVANCED HEART FAILURE CLINIC NOTE  Referring Physician: Lilian Coma., MD  Primary Care: Lilian Coma., MD Primary Cardiologist: N/A  HPI: Charlene Houston is a 77 y.o. 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 Charlene Houston history dates back to 2007 when she was diagnosed with metastatic breast cancer. At that time she reports having a lumpectomy with removal of >12 lymph nodes followed by 6-7 months of chemotherapy and radiation ending in 2008. During this time she was noted to have small possibly metastatic lung nodules that were too small for biopsy (as per patient). In April 2022, she underwent biopsy of a left lower lobe nodule; subsequently diagnosed with stage IV ([id number]) NSCLC, adenocarcinoma. CT CAP and bone scan following that revealed multiple bilateral nodules. She was started on Keytruda on 10/30/20.   According to [name], she tolerated Beryle Flock very well for the first several months of therapy reporting good appetite; improvement in mobility and exercise capacity. Unfortunately, for several weeks prior to admission at [facility], she started to feel increasingly short of breath with worsening lower extremity edema, orthopnea and PND. She was admitted to [facility] in 9/23 with hypoxic respiratory failure that was felt to be secondary to pneumonitis from [facility] and new onset systolic heart failure (LVEF 35%-40%). Beryle Flock was discontinued; Ms. Kiernan was diuresed, started on GDMT and discharged. She has since been to [facility] clinic where she was noted to have significant pitting edema. She was started on Furoscix for 5 days.   Interval hx:  Since our last appointment, she has been admitted to [facility] for blood sugars > 500. At that time she was admitted unresponsive/confused. On  admission, required BIPAP, IV diuresis; she was believed to be encephalopathic from polypharmacy and respiratory failure.  Activity level/exercise tolerance:  Poor, currently in rehab; significant deconditioned after recent admission.  Orthopnea:  Sleeps on 2-3 Paroxysmal noctural dyspnea:  Improving Chest pain/pressure:  no Orthostatic lightheadedness:  no Palpitations:  no Lower extremity edema:  1+ Presyncope/syncope:  no Cough:  No  Past Medical History:  Diagnosis Date   Arthritis    Asthma    Breast cancer ([place])    Diabetes mellitus without complication (HCC)    Hypertension    SBO (small bowel obstruction) ([place]) 06/2018    Current Outpatient Medications  Medication Sig Dispense Refill   acetaminophen (TYLENOL) 650 MG CR tablet Take 1 tablet (650 mg total) by mouth every 8 (eight) hours as needed for pain.     albuterol (ACCUNEB) 1.25 MG/3ML nebulizer solution Take 3 mLs by nebulization every 6 (six) hours as needed for wheezing.     carboxymethylcellulose (REFRESH PLUS) 0.5 % SOLN Place 1 drop into both eyes daily as needed (dry eyes).     carvedilol (COREG) 3.125 MG tablet Take 1 tablet (3.125 mg total) by mouth 2 (two) times daily. 180 tablet 3   cyanocobalamin (,VITAMIN B-12,) 1000 MCG/ML injection Inject 1,000 mcg into the muscle every 30 (thirty) days.     desonide (DESOWEN) 0.05 % cream Apply 1 Application topically as needed.     empagliflozin (JARDIANCE) 10 MG TABS tablet Take 1 tablet (10 mg total) by mouth daily. 30 tablet 11   Ergocalciferol 10 MCG (400 UNIT) TABS Take 800-1,200 Units by mouth See admin instructions. Taking 2 tabs ( 800 units) on Sunday and 3 tabs (  1200) units daily except Sunday.     escitalopram (LEXAPRO) 5 MG tablet Take 5 mg by mouth daily.     folic acid (FOLVITE) 1 MG tablet Take 1 mg by mouth daily.     furosemide (LASIX) 40 MG tablet Take 1 tablet (40 mg total) by mouth 2 (two) times daily. 60 tablet 6   glimepiride (AMARYL) 2 MG tablet  Take 2 mg by mouth daily.     montelukast (SINGULAIR) 10 MG tablet Take 10 mg by mouth daily in the afternoon.     potassium chloride SA (KLOR-CON M) 20 MEQ tablet Take 1 tablet (20 mEq total) by mouth daily. 90 tablet 3   predniSONE (DELTASONE) 10 MG tablet Take 1 tablet (10 mg total) by mouth daily with breakfast.     simvastatin (ZOCOR) 40 MG tablet Take 40 mg by mouth daily.     spironolactone (ALDACTONE) 25 MG tablet Take 1 tablet (25 mg total) by mouth daily. 90 tablet 3   witch hazel-glycerin (TUCKS) pad Apply 1 Application topically as needed for itching. 40 each 0   No current facility-administered medications for this encounter.    Allergies  Allergen Reactions   Prozac [Fluoxetine Hcl] Other (See Comments)    Gittery   Vit D-Vit E-Safflower Oil Other (See Comments)    Not specified      Social History   Socioeconomic History   Marital status: Divorced    Spouse name: Not on file   Number of children: 2   Years of education: Not on file   Highest education level: High school graduate  Occupational History   Occupation: Retired  Tobacco Use   Smoking status: Former    Types: Cigarettes    Quit date: 1995    Years since quitting: 29.1   Smokeless tobacco: Never  Vaping Use   Vaping Use: Never used  Substance and Sexual Activity   Alcohol use: Not Currently   Drug use: Not Currently   Sexual activity: Not Currently  Other Topics Concern   Not on file  Social History Narrative   Not on file   Social Determinants of Health   Financial Resource Strain: Low Risk  (01/04/2022)   Overall Financial Resource Strain (CARDIA)    Difficulty of Paying Living Expenses: Not very hard  Food Insecurity: No Food Insecurity (01/04/2022)   Hunger Vital Sign    Worried About Running Out of Food in the Last Year: Never true    Ran Out of Food in the Last Year: Never true  Transportation Needs: No Transportation Needs (01/04/2022)   PRAPARE - Radiographer, therapeutic (Medical): No    Lack of Transportation (Non-Medical): No  Physical Activity: Not on file  Stress: Not on file  Social Connections: Not on file  Intimate Partner Violence: Not on file      Family History  Problem Relation Age of Onset   Hypertension Mother    Diabetes Mother    Cancer Father     PHYSICAL EXAM: Vitals:   05/13/22 0922  BP: 110/70  Pulse: 77  SpO2: 100%    GENERAL: elderly AAF; on 2L [place]; comfortable HEENT: Negative for arcus senilis or xanthelasma. There is no scleral icterus.  The mucous membranes are pink and moist.   NECK: Supple, No masses. Normal carotid upstrokes without bruits. No masses or thyromegaly.    CHEST: There are no chest wall deformities. There is no chest wall tenderness. Respirations are unlabored.  Lungs- coarse lung sounds; b/l inspiratory crackles CARDIAC:  JVP:8cm         Normal S1, S2  RRR. No murmurs, rubs or gallops.  Pulses are 2+ and symmetrical in upper and lower extremities. 1-2+ ABDOMEN: Soft, non-tender, non-distended. There are no masses or hepatomegaly. There are normal bowel sounds.  EXTREMITIES: Warm and well perfused with no cyanosis, clubbing.  LYMPHATIC: No axillary or supraclavicular lymphadenopathy.  NEUROLOGIC: Patient is oriented x3 with no focal or lateralizing neurologic deficits.  PSYCH: Patients affect is appropriate, there is no evidence of anxiety or depression.  SKIN: Warm and dry; no lesions or wounds.   DATA REVIEW  ECG: 01/04/22: NSR w/ LBBB  ECHO: 01/02/22: LVEF 35%-40%, normal RV function.   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 CMR at the end of January. Will plan to wean off prednisone pending results.Repeat TTE w/ LVEF of 30-35% and significant dyssynchrony due to LBBB. NYHA class / AHA  Stage:III, very deconditioned after recent hospitalization.  Volume status & Diuretics: significant improvement in volume status since our last visit. On lasix 40mg  BID currently. Appears euvolemic on exam.  Vasodilators:losartan 25mg  recent discontinued due to encephalopathy and lightheadedness. Will plan to restart at follow up.  Beta-Blocker: continue coreg 3.125mg  BID.  DVV:OHYWVPXT spironolactone 25mg  daily; Cardiometabolic: jardiance 10mg   Devices therapies & Valvulopathies:n Repeat TTE/ with significant LV dyssynchrony. She may require upgrade to CRT-D, however, I am not sure her life expectancy is > 1 year. At follow up I will discuss device placement with her and her son.  Advanced therapies:not a candidate; plan for repeat cardiac MR as noted above.   NSCLC of the left lung - Stage IV, adenocarcinoma of the left lower lung lobe by biopsy on 4/22. PET scan w/ multiple hypermetabolic b/l pulmonary nodules.  - Started on Keytruda in July 2022 with pneumonitis and cardiomyopathy in 11/2021 now on prolonged prednisone taper and HF GDMT.   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 Gustavo Lah at [facility] in [place]  Pneumonitis - followed by pulmonology & oncology. Prednisone weaned down to 10mg  now.   Dua Mehler Advanced Heart Failure Mechanical Circulatory Support

## 2022-05-13 NOTE — Telephone Encounter (Signed)
K 2.7. per Dr. Majkowski Nones take an extra 40 meq of potassium today and then 40 meq daily. Repeat labs next week. Patient is at [facility]. Tried calling multiple times, no answer and unable to leave message. [phone]

## 2022-05-13 NOTE — Patient Instructions (Addendum)
Can take an extra Lasix for weight gain and edema as needed   Labs done today, your results will be available in MyChart, we will contact you for abnormal readings.  Your physician recommends that you schedule a follow-up appointment in: 3 months (May 2024) ** please call the office in March to arrange your follow up appointment. **   If you have any questions or concerns before your next appointment please send [place] a message through [place] or call our office at [phone].    TO LEAVE A MESSAGE FOR THE NURSE SELECT OPTION 2, PLEASE LEAVE A MESSAGE INCLUDING: YOUR NAME DATE OF BIRTH CALL BACK NUMBER REASON FOR CALL**this is important as we prioritize the call backs  YOU WILL RECEIVE A CALL BACK THE SAME DAY AS LONG AS YOU CALL BEFORE 4:00 PM  At the [facility], you and your health needs are our priority. As part of our continuing mission to provide you with exceptional heart care, we have created designated Provider Care Teams. These Care Teams include your primary Cardiologist (physician) and Advanced Practice Providers (APPs- Physician Assistants and Nurse Practitioners) who all work together to provide you with the care you need, when you need it.   You may see any of the following providers on your designated Care Team at your next follow up: Dr Glori Bickers Dr Loralie Champagne Dr. Roxana Hires, NP Lyda Jester, [place] [facility] [place], [place] Forestine Na, NP Audry Riles, PharmD   Please be sure to bring in all your medications bottles to every appointment.    Thank you for choosing [facility]

## 2022-05-26 ENCOUNTER — Encounter (HOSPITAL_COMMUNITY): Payer: Self-pay

## 2022-05-26 ENCOUNTER — Emergency Department (HOSPITAL_COMMUNITY): Payer: Medicare HMO

## 2022-05-26 ENCOUNTER — Inpatient Hospital Stay (HOSPITAL_COMMUNITY)
Admission: EM | Admit: 2022-05-26 | Discharge: 2022-06-01 | DRG: 689 | Disposition: A | Discharge status: Skilled Nursing Facility | Payer: Medicare HMO | Source: Skilled Nursing Facility | Attending: Internal Medicine | Admitting: Internal Medicine

## 2022-05-26 ENCOUNTER — Other Ambulatory Visit: Payer: Self-pay

## 2022-05-26 DIAGNOSIS — I10 Essential (primary) hypertension: Secondary | ICD-10-CM | POA: Diagnosis present

## 2022-05-26 DIAGNOSIS — R4182 Altered mental status, unspecified: Principal | ICD-10-CM

## 2022-05-26 DIAGNOSIS — Z6833 Body mass index (BMI) 33.0-33.9, adult: Secondary | ICD-10-CM

## 2022-05-26 DIAGNOSIS — G4730 Sleep apnea, unspecified: Secondary | ICD-10-CM | POA: Diagnosis present

## 2022-05-26 DIAGNOSIS — F0392 Unspecified dementia, unspecified severity, with psychotic disturbance: Secondary | ICD-10-CM | POA: Diagnosis present

## 2022-05-26 DIAGNOSIS — T380X5A Adverse effect of glucocorticoids and synthetic analogues, initial encounter: Secondary | ICD-10-CM | POA: Diagnosis present

## 2022-05-26 DIAGNOSIS — D539 Nutritional anemia, unspecified: Secondary | ICD-10-CM | POA: Diagnosis not present

## 2022-05-26 DIAGNOSIS — Z9981 Dependence on supplemental oxygen: Secondary | ICD-10-CM

## 2022-05-26 DIAGNOSIS — E871 Hypo-osmolality and hyponatremia: Secondary | ICD-10-CM | POA: Diagnosis present

## 2022-05-26 DIAGNOSIS — Z8249 Family history of ischemic heart disease and other diseases of the circulatory system: Secondary | ICD-10-CM

## 2022-05-26 DIAGNOSIS — E785 Hyperlipidemia, unspecified: Secondary | ICD-10-CM | POA: Diagnosis present

## 2022-05-26 DIAGNOSIS — Z794 Long term (current) use of insulin: Secondary | ICD-10-CM

## 2022-05-26 DIAGNOSIS — C3490 Malignant neoplasm of unspecified part of unspecified bronchus or lung: Secondary | ICD-10-CM | POA: Diagnosis present

## 2022-05-26 DIAGNOSIS — I5043 Acute on chronic combined systolic (congestive) and diastolic (congestive) heart failure: Secondary | ICD-10-CM | POA: Diagnosis present

## 2022-05-26 DIAGNOSIS — N390 Urinary tract infection, site not specified: Secondary | ICD-10-CM | POA: Diagnosis not present

## 2022-05-26 DIAGNOSIS — I5041 Acute combined systolic (congestive) and diastolic (congestive) heart failure: Secondary | ICD-10-CM | POA: Diagnosis present

## 2022-05-26 DIAGNOSIS — E538 Deficiency of other specified B group vitamins: Secondary | ICD-10-CM | POA: Diagnosis present

## 2022-05-26 DIAGNOSIS — E1165 Type 2 diabetes mellitus with hyperglycemia: Secondary | ICD-10-CM | POA: Diagnosis not present

## 2022-05-26 DIAGNOSIS — D696 Thrombocytopenia, unspecified: Secondary | ICD-10-CM | POA: Diagnosis present

## 2022-05-26 DIAGNOSIS — R55 Syncope and collapse: Secondary | ICD-10-CM | POA: Diagnosis present

## 2022-05-26 DIAGNOSIS — E669 Obesity, unspecified: Secondary | ICD-10-CM | POA: Diagnosis present

## 2022-05-26 DIAGNOSIS — B961 Klebsiella pneumoniae [K. pneumoniae] as the cause of diseases classified elsewhere: Secondary | ICD-10-CM | POA: Diagnosis present

## 2022-05-26 DIAGNOSIS — G9341 Metabolic encephalopathy: Secondary | ICD-10-CM | POA: Diagnosis not present

## 2022-05-26 DIAGNOSIS — E876 Hypokalemia: Secondary | ICD-10-CM | POA: Diagnosis present

## 2022-05-26 DIAGNOSIS — Z7952 Long term (current) use of systemic steroids: Secondary | ICD-10-CM

## 2022-05-26 DIAGNOSIS — Z79899 Other long term (current) drug therapy: Secondary | ICD-10-CM

## 2022-05-26 DIAGNOSIS — C7802 Secondary malignant neoplasm of left lung: Secondary | ICD-10-CM | POA: Diagnosis present

## 2022-05-26 DIAGNOSIS — Z515 Encounter for palliative care: Secondary | ICD-10-CM

## 2022-05-26 DIAGNOSIS — I110 Hypertensive heart disease with heart failure: Secondary | ICD-10-CM | POA: Diagnosis present

## 2022-05-26 DIAGNOSIS — E870 Hyperosmolality and hypernatremia: Secondary | ICD-10-CM | POA: Diagnosis present

## 2022-05-26 DIAGNOSIS — E860 Dehydration: Secondary | ICD-10-CM | POA: Diagnosis present

## 2022-05-26 DIAGNOSIS — L89313 Pressure ulcer of right buttock, stage 3: Secondary | ICD-10-CM | POA: Diagnosis present

## 2022-05-26 DIAGNOSIS — Z1152 Encounter for screening for COVID-19: Secondary | ICD-10-CM

## 2022-05-26 DIAGNOSIS — L89323 Pressure ulcer of left buttock, stage 3: Secondary | ICD-10-CM | POA: Diagnosis present

## 2022-05-26 DIAGNOSIS — J984 Other disorders of lung: Secondary | ICD-10-CM

## 2022-05-26 DIAGNOSIS — Z7984 Long term (current) use of oral hypoglycemic drugs: Secondary | ICD-10-CM

## 2022-05-26 DIAGNOSIS — R296 Repeated falls: Secondary | ICD-10-CM | POA: Diagnosis present

## 2022-05-26 DIAGNOSIS — J9611 Chronic respiratory failure with hypoxia: Secondary | ICD-10-CM | POA: Diagnosis present

## 2022-05-26 DIAGNOSIS — Z888 Allergy status to other drugs, medicaments and biological substances status: Secondary | ICD-10-CM

## 2022-05-26 DIAGNOSIS — Z85118 Personal history of other malignant neoplasm of bronchus and lung: Secondary | ICD-10-CM

## 2022-05-26 DIAGNOSIS — Z833 Family history of diabetes mellitus: Secondary | ICD-10-CM

## 2022-05-26 DIAGNOSIS — Z90710 Acquired absence of both cervix and uterus: Secondary | ICD-10-CM

## 2022-05-26 DIAGNOSIS — I5022 Chronic systolic (congestive) heart failure: Secondary | ICD-10-CM | POA: Diagnosis present

## 2022-05-26 DIAGNOSIS — E119 Type 2 diabetes mellitus without complications: Secondary | ICD-10-CM

## 2022-05-26 DIAGNOSIS — Z66 Do not resuscitate: Secondary | ICD-10-CM | POA: Diagnosis present

## 2022-05-26 DIAGNOSIS — Z87891 Personal history of nicotine dependence: Secondary | ICD-10-CM

## 2022-05-26 DIAGNOSIS — J704 Drug-induced interstitial lung disorders, unspecified: Secondary | ICD-10-CM | POA: Diagnosis present

## 2022-05-26 DIAGNOSIS — Z89012 Acquired absence of left thumb: Secondary | ICD-10-CM

## 2022-05-26 DIAGNOSIS — Z853 Personal history of malignant neoplasm of breast: Secondary | ICD-10-CM

## 2022-05-26 DIAGNOSIS — T50905A Adverse effect of unspecified drugs, medicaments and biological substances, initial encounter: Secondary | ICD-10-CM

## 2022-05-26 DIAGNOSIS — J45909 Unspecified asthma, uncomplicated: Secondary | ICD-10-CM | POA: Diagnosis present

## 2022-05-26 LAB — COMPREHENSIVE METABOLIC PANEL
ALT: 16 U/L (ref 0–44)
AST: 22 U/L (ref 15–41)
Albumin: 2.4 g/dL — ABNORMAL LOW (ref 3.5–5.0)
Alkaline Phosphatase: 93 U/L (ref 38–126)
Anion gap: 12 (ref 5–15)
BUN: 17 mg/dL (ref 8–23)
CO2: 42 mmol/L — ABNORMAL HIGH (ref 22–32)
Calcium: 9 mg/dL (ref 8.9–10.3)
Chloride: 87 mmol/L — ABNORMAL LOW (ref 98–111)
Creatinine, Ser: 1.16 mg/dL — ABNORMAL HIGH (ref 0.44–1.00)
GFR, Estimated: 49 mL/min — ABNORMAL LOW (ref >60)
Glucose, Bld: 336 mg/dL — ABNORMAL HIGH (ref 70–99)
Potassium: 3.9 mmol/L (ref 3.5–5.1)
Sodium: 141 mmol/L (ref 135–145)
Total Bilirubin: 1.2 mg/dL (ref 0.3–1.2)
Total Protein: 5.3 g/dL — ABNORMAL LOW (ref 6.5–8.1)

## 2022-05-26 LAB — CBC WITH DIFFERENTIAL/PLATELET
Abs Immature Granulocytes: 0.19 10*3/uL — ABNORMAL HIGH (ref 0.00–0.07)
Basophils Absolute: 0 10*3/uL (ref 0.0–0.1)
Basophils Relative: 0 %
Eosinophils Absolute: 0.1 10*3/uL (ref 0.0–0.5)
Eosinophils Relative: 0 %
HCT: 37.5 % (ref 36.0–46.0)
Hemoglobin: 11.6 g/dL — ABNORMAL LOW (ref 12.0–15.0)
Immature Granulocytes: 1 %
Lymphocytes Relative: 8 %
Lymphs Abs: 1.1 10*3/uL (ref 0.7–4.0)
MCH: 30.9 pg (ref 26.0–34.0)
MCHC: 30.9 g/dL (ref 30.0–36.0)
MCV: 100 fL (ref 80.0–100.0)
Monocytes Absolute: 0.8 10*3/uL (ref 0.1–1.0)
Monocytes Relative: 6 %
Neutro Abs: 11.9 10*3/uL — ABNORMAL HIGH (ref 1.7–7.7)
Neutrophils Relative %: 85 %
Platelets: 148 10*3/uL — ABNORMAL LOW (ref 150–400)
RBC: 3.75 MIL/uL — ABNORMAL LOW (ref 3.87–5.11)
RDW: 14.5 % (ref 11.5–15.5)
WBC: 14.1 10*3/uL — ABNORMAL HIGH (ref 4.0–10.5)
nRBC: 1.1 % — ABNORMAL HIGH (ref 0.0–0.2)

## 2022-05-26 LAB — I-STAT VENOUS BLOOD GAS, ED
Acid-Base Excess: 22 mmol/L — ABNORMAL HIGH (ref 0.0–2.0)
Bicarbonate: 48.4 mmol/L — ABNORMAL HIGH (ref 20.0–28.0)
Calcium, Ion: 1.1 mmol/L — ABNORMAL LOW (ref 1.15–1.40)
HCT: 36 % (ref 36.0–46.0)
Hemoglobin: 12.2 g/dL (ref 12.0–15.0)
O2 Saturation: 88 %
Potassium: 3.8 mmol/L (ref 3.5–5.1)
Sodium: 140 mmol/L (ref 135–145)
TCO2: >50 mmol/L — ABNORMAL HIGH (ref 22–32)
pCO2, Ven: 60.6 mmHg — ABNORMAL HIGH (ref 44–60)
pH, Ven: 7.511 — ABNORMAL HIGH (ref 7.25–7.43)
pO2, Ven: 52 mmHg — ABNORMAL HIGH (ref 32–45)

## 2022-05-26 LAB — GLUCOSE, CAPILLARY
Glucose-Capillary: 302 mg/dL — ABNORMAL HIGH (ref 70–99)
Glucose-Capillary: 231 mg/dL — ABNORMAL HIGH (ref 70–99)

## 2022-05-26 LAB — URINALYSIS, ROUTINE W REFLEX MICROSCOPIC
Bilirubin Urine: NEGATIVE
Glucose, UA: >=500 mg/dL — AB
Ketones, ur: NEGATIVE mg/dL
Nitrite: NEGATIVE
Protein, ur: NEGATIVE mg/dL
Specific Gravity, Urine: 1.023 (ref 1.005–1.030)
WBC, UA: >50 WBC/hpf (ref 0–5)
pH: 8 (ref 5.0–8.0)

## 2022-05-26 LAB — HEMOGLOBIN A1C
Hgb A1c MFr Bld: 8.3 % — ABNORMAL HIGH (ref 4.8–5.6)
Mean Plasma Glucose: 191.51 mg/dL

## 2022-05-26 LAB — CBC
HCT: 39.9 % (ref 36.0–46.0)
Hemoglobin: 12.2 g/dL (ref 12.0–15.0)
MCH: 31.1 pg (ref 26.0–34.0)
MCHC: 30.6 g/dL (ref 30.0–36.0)
MCV: 101.8 fL — ABNORMAL HIGH (ref 80.0–100.0)
Platelets: 114 10*3/uL — ABNORMAL LOW (ref 150–400)
RBC: 3.92 MIL/uL (ref 3.87–5.11)
RDW: 14.6 % (ref 11.5–15.5)
WBC: 15.4 10*3/uL — ABNORMAL HIGH (ref 4.0–10.5)
nRBC: 0.7 % — ABNORMAL HIGH (ref 0.0–0.2)

## 2022-05-26 LAB — CREATININE, SERUM
Creatinine, Ser: 1.08 mg/dL — ABNORMAL HIGH (ref 0.44–1.00)
GFR, Estimated: 53 mL/min — ABNORMAL LOW (ref >60)

## 2022-05-26 LAB — RESP PANEL BY RT-PCR (RSV, FLU A&B, COVID)  RVPGX2
Influenza A by PCR: NEGATIVE
Influenza B by PCR: NEGATIVE
Resp Syncytial Virus by PCR: NEGATIVE
SARS Coronavirus 2 by RT PCR: NEGATIVE

## 2022-05-26 LAB — LIPASE, BLOOD: Lipase: 41 U/L (ref 11–51)

## 2022-05-26 LAB — CBG MONITORING, ED: Glucose-Capillary: 312 mg/dL — ABNORMAL HIGH (ref 70–99)

## 2022-05-26 LAB — BETA-HYDROXYBUTYRIC ACID: Beta-Hydroxybutyric Acid: 0.53 mmol/L — ABNORMAL HIGH (ref 0.05–0.27)

## 2022-05-26 MED ORDER — ONDANSETRON HCL 4 MG/2ML IJ SOLN: 4.0000 mg | Freq: Four times a day (QID) | INTRAMUSCULAR | Status: DC | PRN

## 2022-05-26 MED ORDER — EMPAGLIFLOZIN 10 MG PO TABS: 10.0000 mg | ORAL_TABLET | Freq: Every day | ORAL | Status: DC

## 2022-05-26 MED ORDER — LACTATED RINGERS IV BOLUS
500.0000 mL | Freq: Once | INTRAVENOUS | Status: AC
  Administered 2022-05-26: 500 mL via INTRAVENOUS

## 2022-05-26 MED ORDER — SPIRONOLACTONE 25 MG PO TABS: 25.0000 mg | ORAL_TABLET | Freq: Every day | ORAL | Status: DC

## 2022-05-26 MED ORDER — ALBUTEROL SULFATE (2.5 MG/3ML) 0.083% IN NEBU: 2.5000 mg | INHALATION_SOLUTION | Freq: Four times a day (QID) | RESPIRATORY_TRACT | Status: DC | PRN

## 2022-05-26 MED ORDER — ENOXAPARIN SODIUM 40 MG/0.4ML IJ SOSY
40.0000 mg | PREFILLED_SYRINGE | INTRAMUSCULAR | Status: DC
  Administered 2022-05-26 – 2022-05-28 (×3): 40 mg via SUBCUTANEOUS
  Filled 2022-05-26 (×3): qty 0.4

## 2022-05-26 MED ORDER — PRO-STAT PO LIQD: 30.0000 mL | Freq: Two times a day (BID) | ORAL | Status: DC

## 2022-05-26 MED ORDER — POTASSIUM CHLORIDE CRYS ER 20 MEQ PO TBCR
20.0000 meq | EXTENDED_RELEASE_TABLET | Freq: Every day | ORAL | Status: DC
  Administered 2022-05-26 – 2022-06-01 (×7): 20 meq via ORAL
  Filled 2022-05-26 (×7): qty 1

## 2022-05-26 MED ORDER — FUROSEMIDE 40 MG PO TABS
40.0000 mg | ORAL_TABLET | Freq: Every day | ORAL | Status: DC
  Administered 2022-05-26: 40 mg via ORAL
  Filled 2022-05-26: qty 2

## 2022-05-26 MED ORDER — POLYVINYL ALCOHOL 1.4 % OP SOLN: 1.0000 [drp] | Freq: Every day | OPHTHALMIC | Status: DC | PRN

## 2022-05-26 MED ORDER — CARVEDILOL 3.125 MG PO TABS
3.1250 mg | ORAL_TABLET | Freq: Two times a day (BID) | ORAL | Status: DC
  Administered 2022-05-27 – 2022-06-01 (×10): 3.125 mg via ORAL
  Filled 2022-05-26 (×11): qty 1

## 2022-05-26 MED ORDER — SODIUM CHLORIDE 0.9 % IV SOLN
1.0000 g | INTRAVENOUS | Status: AC
  Administered 2022-05-27 – 2022-05-31 (×5): 1 g via INTRAVENOUS
  Filled 2022-05-26 (×5): qty 10

## 2022-05-26 MED ORDER — PREDNISONE 5 MG PO TABS
5.0000 mg | ORAL_TABLET | Freq: Every day | ORAL | Status: DC
  Administered 2022-05-27 – 2022-06-01 (×6): 5 mg via ORAL
  Filled 2022-05-26 (×6): qty 1

## 2022-05-26 MED ORDER — ONDANSETRON HCL 4 MG PO TABS: 4.0000 mg | ORAL_TABLET | Freq: Four times a day (QID) | ORAL | Status: DC | PRN

## 2022-05-26 MED ORDER — VITAMIN D 25 MCG (1000 UNIT) PO TABS
1000.0000 [IU] | ORAL_TABLET | Freq: Every day | ORAL | Status: DC
  Administered 2022-05-28 – 2022-06-01 (×5): 1000 [IU] via ORAL
  Filled 2022-05-26 (×6): qty 1

## 2022-05-26 MED ORDER — TRAMADOL HCL 50 MG PO TABS
50.0000 mg | ORAL_TABLET | Freq: Two times a day (BID) | ORAL | Status: DC | PRN
  Administered 2022-05-26 – 2022-06-01 (×6): 50 mg via ORAL
  Filled 2022-05-26 (×7): qty 1

## 2022-05-26 MED ORDER — INSULIN GLARGINE-YFGN 100 UNIT/ML ~~LOC~~ SOLN
6.0000 [IU] | Freq: Every day | SUBCUTANEOUS | Status: DC
  Administered 2022-05-26 – 2022-05-27 (×2): 6 [IU] via SUBCUTANEOUS
  Filled 2022-05-26 (×3): qty 0.06

## 2022-05-26 MED ORDER — CALCIUM GLUCONATE-NACL 1-0.675 GM/50ML-% IV SOLN
1.0000 g | Freq: Once | INTRAVENOUS | Status: AC
  Administered 2022-05-26: 1000 mg via INTRAVENOUS
  Filled 2022-05-26: qty 50

## 2022-05-26 MED ORDER — FOLIC ACID 1 MG PO TABS
1.0000 mg | ORAL_TABLET | Freq: Every day | ORAL | Status: DC
  Administered 2022-05-26 – 2022-06-01 (×7): 1 mg via ORAL
  Filled 2022-05-26 (×7): qty 1

## 2022-05-26 MED ORDER — BARRIER CREAM NON-SPECIFIED: 1.0000 | TOPICAL_CREAM | TOPICAL | Status: DC

## 2022-05-26 MED ORDER — SIMVASTATIN 20 MG PO TABS
40.0000 mg | ORAL_TABLET | Freq: Every day | ORAL | Status: DC
  Administered 2022-05-26 – 2022-05-31 (×5): 40 mg via ORAL
  Filled 2022-05-26 (×7): qty 2

## 2022-05-26 MED ORDER — DIPHENHYDRAMINE HCL 50 MG/ML IJ SOLN: 25.0000 mg | Freq: Once | INTRAMUSCULAR | Status: DC

## 2022-05-26 MED ORDER — SODIUM CHLORIDE 0.9 % IV SOLN
1.0000 g | Freq: Once | INTRAVENOUS | Status: AC
  Administered 2022-05-26: 1 g via INTRAVENOUS
  Filled 2022-05-26: qty 10

## 2022-05-26 MED ORDER — MONTELUKAST SODIUM 10 MG PO TABS
10.0000 mg | ORAL_TABLET | Freq: Every day | ORAL | Status: DC
  Administered 2022-05-27 – 2022-06-01 (×6): 10 mg via ORAL
  Filled 2022-05-26 (×6): qty 1

## 2022-05-26 MED ORDER — INSULIN ASPART 100 UNIT/ML IJ SOLN
0.0000 [IU] | Freq: Every day | INTRAMUSCULAR | Status: DC
  Administered 2022-05-26: 2 [IU] via SUBCUTANEOUS
  Administered 2022-05-27: 5 [IU] via SUBCUTANEOUS

## 2022-05-26 MED ORDER — GLUCERNA 1.2 CAL PO LIQD
237.0000 mL | Freq: Every evening | ORAL | Status: DC
  Administered 2022-05-26 – 2022-05-31 (×5): 237 mL via ORAL
  Filled 2022-05-26 (×7): qty 237

## 2022-05-26 MED ORDER — PREDNISONE 5 MG PO TABS: 10.0000 mg | ORAL_TABLET | Freq: Every day | ORAL | Status: DC

## 2022-05-26 MED ORDER — HYDROXYZINE HCL 25 MG PO TABS
25.0000 mg | ORAL_TABLET | Freq: Three times a day (TID) | ORAL | Status: DC | PRN
  Administered 2022-05-26 – 2022-05-28 (×2): 25 mg via ORAL
  Filled 2022-05-26 (×3): qty 1

## 2022-05-26 MED ORDER — PROSOURCE PLUS PO LIQD
30.0000 mL | Freq: Two times a day (BID) | ORAL | Status: DC
  Administered 2022-05-27 – 2022-06-01 (×7): 30 mL via ORAL
  Filled 2022-05-26 (×8): qty 30

## 2022-05-26 MED ORDER — ACETAMINOPHEN 325 MG PO TABS
650.0000 mg | ORAL_TABLET | Freq: Three times a day (TID) | ORAL | Status: DC | PRN
  Administered 2022-05-28 – 2022-05-31 (×2): 650 mg via ORAL
  Filled 2022-05-26 (×3): qty 2

## 2022-05-26 MED ORDER — HYDRALAZINE HCL 20 MG/ML IJ SOLN: 5.0000 mg | Freq: Four times a day (QID) | INTRAMUSCULAR | Status: DC | PRN

## 2022-05-26 MED ORDER — INSULIN ASPART 100 UNIT/ML IJ SOLN
0.0000 [IU] | Freq: Three times a day (TID) | INTRAMUSCULAR | Status: DC
  Administered 2022-05-27 (×2): 5 [IU] via SUBCUTANEOUS
  Administered 2022-05-28 (×2): 15 [IU] via SUBCUTANEOUS
  Administered 2022-05-28: 3 [IU] via SUBCUTANEOUS
  Administered 2022-05-29: 8 [IU] via SUBCUTANEOUS
  Administered 2022-05-29 – 2022-05-30 (×2): 3 [IU] via SUBCUTANEOUS
  Administered 2022-05-30 (×2): 2 [IU] via SUBCUTANEOUS
  Administered 2022-05-31: 5 [IU] via SUBCUTANEOUS

## 2022-05-26 MED ORDER — LORAZEPAM 2 MG/ML IJ SOLN: 0.5000 mg | Freq: Once | INTRAMUSCULAR | Status: DC

## 2022-05-26 MED ORDER — ESCITALOPRAM OXALATE 10 MG PO TABS
5.0000 mg | ORAL_TABLET | Freq: Every day | ORAL | Status: DC
  Administered 2022-05-26 – 2022-06-01 (×7): 5 mg via ORAL
  Filled 2022-05-26 (×7): qty 1

## 2022-05-26 NOTE — ED Notes (Addendum)
ED TO INPATIENT HANDOFF REPORT  ED Nurse Name and Phone #:   S Name/Age/Gender Charlene [place] 77 y.o. female Room/Bed: 031C/031C  Code Status   Code Status: Full Code  Home/SNF/Other Home Patient oriented to: self, place, time, and situation Is this baseline? No   Triage Complete: Triage complete  Chief Complaint Acute metabolic encephalopathy [H47.65]  Triage Note Pt arrives via EMS from [place]. Staff reported to EMS that she is currently being treated for a UTI. Staff at facility called 911 due to patient having some new confusion. Pt is only oriented to name and dob. EMS reports BG of 540.    Allergies Allergies  Allergen Reactions   Prozac [Fluoxetine Hcl] Other (See Comments)    Jitters   Safflower Oil Other (See Comments)    Unknown reaction Documented on MAR    Level of Care/Admitting Diagnosis ED Disposition     ED Disposition  Admit   Condition  --   [place]: [place] [[id number]]  Level of Care: Telemetry Medical [104]  May place patient in observation at [facility] or [place] if equivalent level of care is available:: Yes  Covid Evaluation: Confirmed COVID Negative  Diagnosis: Acute metabolic encephalopathy [[id number]]  Admitting Physician: Guilford Shi [[id number]]  Attending Physician: Guilford Shi [[id number]]          B Medical/Surgery History Past Medical History:  Diagnosis Date   Arthritis    Asthma    Breast cancer ([place])    Diabetes mellitus without complication ([place])    Hypertension    SBO (small bowel obstruction) ([place]) 06/2018   Past Surgical History:  Procedure Laterality Date   ABDOMINAL HYSTERECTOMY     THUMB AMPUTATION Left    PARTIAL   TONSILLECTOMY       A IV Location/Drains/Wounds Patient Lines/Drains/Airways Status     Active Line/Drains/Airways     Name Placement date Placement time Site Days   Peripheral IV 05/26/22 20 G Right Antecubital 05/26/22  0940   Antecubital  less than 1   External Urinary Catheter 04/28/22  1700  --  28   Pressure Injury 04/22/22 Buttocks Left;Right These are partial thickness wounds related to moisture associated skin damage, not pressure 04/22/22  2220  -- 34   Pressure Injury 04/23/22 Buttocks Left Stage 2 -  Partial thickness loss of dermis presenting as a shallow open injury with a red, pink wound bed without slough. 04/23/22  1000  -- 33            Intake/Output Last 24 hours No intake or output data in the 24 hours ending 05/26/22 1557  Labs/Imaging Results for orders placed or performed during the hospital encounter of 05/26/22 (from the past 48 hour(s))  Resp panel by RT-PCR (RSV, Flu A&B, Covid) Anterior Nasal Swab     Status: None   Collection Time: 05/26/22  9:17 AM   Specimen: Anterior Nasal Swab  Result Value Ref Range   SARS Coronavirus 2 by RT PCR NEGATIVE NEGATIVE   Influenza A by PCR NEGATIVE NEGATIVE   Influenza B by PCR NEGATIVE NEGATIVE    Comment: (NOTE) The Xpert Xpress SARS-CoV-2/FLU/RSV plus assay is intended as an aid in the diagnosis of influenza from Nasopharyngeal swab specimens and should not be used as a sole basis for treatment. Nasal washings and aspirates are unacceptable for Xpert Xpress SARS-CoV-2/FLU/RSV testing.  Fact Sheet for Patients: EntrepreneurPulse.com.au  Fact Sheet for Healthcare Providers: IncredibleEmployment.be  This test  is not yet approved or cleared by the [place] and has been authorized for detection and/or diagnosis of SARS-CoV-2 by FDA under an Emergency Use Authorization (EUA). This EUA will remain in effect (meaning this test can be used) for the duration of the COVID-19 declaration under Section 564(b)(1) of the Act, 21 U.S.C. section 360bbb-3(b)(1), unless the authorization is terminated or revoked.     Resp Syncytial Virus by PCR NEGATIVE NEGATIVE    Comment: (NOTE) Fact Sheet for  Patients: EntrepreneurPulse.com.au  Fact Sheet for Healthcare Providers: IncredibleEmployment.be  This test is not yet approved or cleared by the [place] FDA and has been authorized for detection and/or diagnosis of SARS-CoV-2 by FDA under an Emergency Use Authorization (EUA). This EUA will remain in effect (meaning this test can be used) for the duration of the COVID-19 declaration under Section 564(b)(1) of the Act, 21 U.S.C. section 360bbb-3(b)(1), unless the authorization is terminated or revoked.  Performed at [facility], [place] [address], [place], [place] [address]   CBG monitoring, ED     Status: Abnormal   Collection Time: 05/26/22  9:22 AM  Result Value Ref Range   Glucose-Capillary 312 (H) 70 - 99 mg/dL    Comment: Glucose reference range applies only to samples taken after fasting for at least 8 hours.  CBC with Differential     Status: Abnormal   Collection Time: 05/26/22  9:40 AM  Result Value Ref Range   WBC 14.1 (H) 4.0 - 10.5 K/uL   RBC 3.75 (L) 3.87 - 5.11 MIL/uL   Hemoglobin 11.6 (L) 12.0 - 15.0 g/dL   HCT 37.5 36.0 - 46.0 %   MCV 100.0 80.0 - 100.0 fL   MCH 30.9 26.0 - 34.0 pg   MCHC 30.9 30.0 - 36.0 g/dL   RDW 14.5 11.5 - 15.5 %   Platelets 148 (L) 150 - 400 K/uL   nRBC 1.1 (H) 0.0 - 0.2 %   Neutrophils Relative % 85 %   Neutro Abs 11.9 (H) 1.7 - 7.7 K/uL   Lymphocytes Relative 8 %   Lymphs Abs 1.1 0.7 - 4.0 K/uL   Monocytes Relative 6 %   Monocytes Absolute 0.8 0.1 - 1.0 K/uL   Eosinophils Relative 0 %   Eosinophils Absolute 0.1 0.0 - 0.5 K/uL   Basophils Relative 0 %   Basophils Absolute 0.0 0.0 - 0.1 K/uL   Immature Granulocytes 1 %   Abs Immature Granulocytes 0.19 (H) 0.00 - 0.07 K/uL    Comment: Performed at [facility], [address], [place], [place] [address]  Comprehensive metabolic panel     Status: Abnormal   Collection Time: 05/26/22  9:40 AM  Result Value Ref Range   Sodium 141  135 - 145 mmol/L   Potassium 3.9 3.5 - 5.1 mmol/L   Chloride 87 (L) 98 - 111 mmol/L   CO2 42 (H) 22 - 32 mmol/L   Glucose, Bld 336 (H) 70 - 99 mg/dL    Comment: Glucose reference range applies only to samples taken after fasting for at least 8 hours.   BUN 17 8 - 23 mg/dL   Creatinine, Ser 1.16 (H) 0.44 - 1.00 mg/dL   Calcium 9.0 8.9 - 10.3 mg/dL   Total Protein 5.3 (L) 6.5 - 8.1 g/dL   Albumin 2.4 (L) 3.5 - 5.0 g/dL   AST 22 15 - 41 U/L   ALT 16 0 - 44 U/L   Alkaline Phosphatase 93  38 - 126 U/L   Total Bilirubin 1.2 0.3 - 1.2 mg/dL   GFR, Estimated 49 (L) >60 mL/min    Comment: (NOTE) Calculated using the CKD-EPI Creatinine Equation (2021)    Anion gap 12 5 - 15    Comment: Performed at [facility] [address] [place], [place] [address]  Lipase, blood     Status: None   Collection Time: 05/26/22  9:40 AM  Result Value Ref Range   Lipase 41 11 - 51 U/L    Comment: Performed at [facility] [address], [place], [place] [address]  Beta-hydroxybutyric acid     Status: Abnormal   Collection Time: 05/26/22  9:40 AM  Result Value Ref Range   Beta-Hydroxybutyric Acid 0.53 (H) 0.05 - 0.27 mmol/L    Comment: Performed at [facility] [address] [place], [place] [address]  I-Stat venous blood gas, [facility] ED, [facility], [facility])     Status: Abnormal   Collection Time: 05/26/22  9:57 AM  Result Value Ref Range   pH, Ven 7.511 (H) 7.25 - 7.43   pCO2, Ven 60.6 (H) 44 - 60 mmHg   pO2, Ven 52 (H) 32 - 45 mmHg   Bicarbonate 48.4 (H) 20.0 - 28.0 mmol/L   TCO2 >50 (H) 22 - 32 mmol/L   O2 Saturation 88 %   Acid-Base Excess 22.0 (H) 0.0 - 2.0 mmol/L   Sodium 140 135 - 145 mmol/L   Potassium 3.8 3.5 - 5.1 mmol/L   Calcium, Ion 1.10 (L) 1.15 - 1.40 mmol/L   HCT 36.0 36.0 - 46.0 %   Hemoglobin 12.2 12.0 - 15.0 g/dL   Sample type VENOUS   Urinalysis, Routine w reflex microscopic -Urine, Clean Catch     Status: Abnormal   Collection Time: 05/26/22 12:45 PM  Result Value  Ref Range   Color, Urine YELLOW YELLOW   APPearance HAZY (A) CLEAR   Specific Gravity, Urine 1.023 1.005 - 1.030   pH 8.0 5.0 - 8.0   Glucose, UA >=500 (A) NEGATIVE mg/dL   Hgb urine dipstick SMALL (A) NEGATIVE   Bilirubin Urine NEGATIVE NEGATIVE   Ketones, ur NEGATIVE NEGATIVE mg/dL   Protein, ur NEGATIVE NEGATIVE mg/dL   Nitrite NEGATIVE NEGATIVE   Leukocytes,Ua LARGE (A) NEGATIVE   RBC / HPF 21-50 0 - 5 RBC/hpf   WBC, UA >50 0 - 5 WBC/hpf   Bacteria, UA FEW (A) NONE SEEN   Squamous Epithelial / HPF 0-5 0 - 5 /HPF   Mucus PRESENT    Budding Yeast PRESENT     Comment: Performed at [facility], [place] [address], [place], [place] [address]   CT HEAD WO CONTRAST (5MM)  Result Date: 05/26/2022 CLINICAL DATA:  Head trauma. Mental status change urinary tract infection. EXAM: CT HEAD WITHOUT CONTRAST TECHNIQUE: Contiguous axial images were obtained from the base of the skull through the vertex without intravenous contrast. RADIATION DOSE REDUCTION: This exam was performed according to the departmental dose-optimization program which includes automated exposure control, adjustment of the mA and/or kV according to patient size and/or use of iterative reconstruction technique. COMPARISON:  CT head dated April 22, 2022 FINDINGS: Brain: No evidence of acute infarction, hemorrhage, hydrocephalus, extra-axial collection or mass lesion/mass effect. Mild generalized cerebral atrophy and chronic microvascular ischemic changes of the white matter, unchanged. Vascular: No hyperdense vessel or unexpected calcification. Skull: Normal. Negative for fracture or focal lesion. Sinuses/Orbits: No acute finding. Other: None.  IMPRESSION: 1. No acute intracranial abnormality. 2. Mild generalized cerebral atrophy and chronic microvascular ischemic changes of the white matter, unchanged. Electronically Signed   By: Keane Police D.O.   On: 05/26/2022 13:28   DG Chest Portable 1 View  Result Date:  05/26/2022 CLINICAL DATA:  Altered mental status. Urinary tract infection. Diabetes. EXAM: PORTABLE CHEST 1 VIEW COMPARISON:  04/23/2022 FINDINGS: Right-sided Port-A-Cath tip at low right atrium. Numerous leads and wires project over the chest. Right breast and axillary surgical clips. Midline trachea. Mild cardiomegaly. No pleural effusion or pneumothorax. No congestive failure. Low lung volumes. Improved left base aeration with mild airspace disease remaining. IMPRESSION: Cardiomegaly and low lung volumes. Improved left base aeration with minimal airspace disease remaining, favoring atelectasis. Right Port-A-Cath tip at low right atrium, as before. Electronically Signed   By: Abigail Miyamoto M.D.   On: 05/26/2022 09:35    Pending Labs Unresulted Labs (From admission, onward)     Start     Ordered   06/02/22 0500  Creatinine, serum  (enoxaparin (LOVENOX)    CrCl >/= 30 ml/min)  Weekly,   R     Comments: while on enoxaparin therapy    05/26/22 1541   05/27/22 8657  Basic metabolic panel  Tomorrow morning,   R        05/26/22 1541   05/27/22 0500  CBC  Tomorrow morning,   R        05/26/22 1541   05/26/22 1539  CBC  (enoxaparin (LOVENOX)    CrCl >/= 30 ml/min)  Once,   R       Comments: Baseline for enoxaparin therapy IF NOT ALREADY DRAWN.  Notify MD if PLT < 100 K.    05/26/22 1541   05/26/22 1539  Creatinine, serum  (enoxaparin (LOVENOX)    CrCl >/= 30 ml/min)  Once,   R       Comments: Baseline for enoxaparin therapy IF NOT ALREADY DRAWN.    05/26/22 1541   05/26/22 1406  Blood culture (routine x 2)  BLOOD CULTURE X 2,   R (with STAT occurrences)      05/26/22 1409            Vitals/Pain Today's Vitals   05/26/22 1230 05/26/22 1245 05/26/22 1300 05/26/22 1509  BP: 95/66 110/68 99/69 (!) 112/59  Pulse: (!) 101 100 100 (!) 101  Resp: 20   20  Temp:    97.7 F (36.5 C)  TempSrc:    Oral  SpO2: 100% 98% 97% 97%  PainSc:        Isolation Precautions No active  isolations  Medications Medications  acetaminophen (TYLENOL) tablet 650 mg (has no administration in time range)  carvedilol (COREG) tablet 3.125 mg (has no administration in time range)  furosemide (LASIX) tablet 40 mg (has no administration in time range)  simvastatin (ZOCOR) tablet 40 mg (has no administration in time range)  spironolactone (ALDACTONE) tablet 25 mg (has no administration in time range)  escitalopram (LEXAPRO) tablet 5 mg (has no administration in time range)  predniSONE (DELTASONE) tablet 10 mg (has no administration in time range)  folic acid (FOLVITE) tablet 1 mg (has no administration in time range)  Pro-Stat LIQD 30 mL (has no administration in time range)  Vitamin D2 TABS 800-1,200 Units (has no administration in time range)  Glucerna liquid 1 Can (has no administration in time range)  potassium chloride SA (KLOR-CON M) CR tablet 20 mEq (has no administration in time  range)  albuterol (PROVENTIL) (2.5 MG/3ML) 0.083% nebulizer solution 2.5 mg (has no administration in time range)  montelukast (SINGULAIR) tablet 10 mg (has no administration in time range)  barrier cream (non-specified) 1 Application (has no administration in time range)  carboxymethylcellulose (REFRESH PLUS) 0.5 % ophthalmic solution 1 drop (has no administration in time range)  calcium gluconate 1 g/ 50 mL sodium chloride IVPB (has no administration in time range)  enoxaparin (LOVENOX) injection 40 mg (has no administration in time range)  ondansetron (ZOFRAN) tablet 4 mg (has no administration in time range)    Or  ondansetron (ZOFRAN) injection 4 mg (has no administration in time range)  hydrALAZINE (APRESOLINE) injection 5 mg (has no administration in time range)  cefTRIAXone (ROCEPHIN) 1 g in sodium chloride 0.9 % 100 mL IVPB (has no administration in time range)  lactated ringers bolus 500 mL (0 mLs Intravenous Stopped 05/26/22 1317)  cefTRIAXone (ROCEPHIN) 1 g in sodium chloride 0.9 % 100 mL  IVPB (0 g Intravenous Stopped 05/26/22 1523)    Mobility Non-ambulatory      Focused Assessments Neuro Assessment Handoff:  Swallow screen pass?          Neuro Assessment:   Neuro Checks:      Has TPA been given? No If patient is a Neuro Trauma and patient is going to OR before floor call report to [place] nurse: [phone] or [phone]   R Recommendations: See Admitting Provider Note  Report given to:   Additional Notes:

## 2022-05-26 NOTE — ED Triage Notes (Signed)
Pt arrives via EMS from [place]. Staff reported to EMS that she is currently being treated for a UTI. Staff at facility called 911 due to patient having some new confusion. Pt is only oriented to name and dob. EMS reports BG of 540.

## 2022-05-26 NOTE — H&P (Addendum)
History and Physical    DOA: 05/26/2022  PCP: Lilian Coma., MD  Patient coming from: [facility] home  Chief Complaint: AMS, elevated BG  HPI: Charlene Houston is a 77 y.o. female with history h/o HTN, DM, arthritis, stage IIIA right breast cancer in 2007 s/p lumpectomy, asthma,?  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  Patient partially oriented at this time and unable to provide detailed history.  Most of the history obtained by talking to daughter over the phone and review of old records.  According to the daughter, patient has had gradual decline in mental status since last summer after she came off immunotherapy.  She apparently had a fall resulting in right wrist fracture requiring a cast around August last year requiring hospitalization subsequent to which she was discharged home.  She was living alone and had home health/home PT services with home meals arranged by daughter.  She was able to ambulate with rollator walker.  Following diagnosis of pneumonitis patient was started on high-dose prednisone with gradual taper by her oncologist.  Last month patient was admitted for hyperglycemia and associated altered mental status/CHF subsequent to which she was discharged to [facility] home for rehab.  According to the daughter, patient was still somewhat confused at that discharge and intermittently noted to have hallucinations/tangential or out of context speech.  Yesterday, daughter received a call from rehab that patient was incoherent and was  babbling prompting further workup and noted to have abnormal UA.  She was started on antibiotics.  This morning patient was noted to have worsening confusion/speech pattern along with hyperglycemia with blood glucose 540 prompting ED referral. ED course: Afebrile, pulse 102, RR 19, blood pressure 95/66-112/59, O2 sat 97 to 100% on 2 L.  Sodium 141, potassium 3.9, bicarb 42, chloride 87, blood glucose 336, BUN 17, creatinine 1.16, calcium 9, ionized calcium 1.10 ,albumin 2.4,   Review of Systems: As per HPI, otherwise review of systems negative.  When asked about diarrhea patient did say "yes and no".  She was unable to elaborate too much but did state they had to clean her "a lot" earlier today and yesterday.  Did not receive any report of diarrhea from the facility but will monitor here.   Past Medical History:  Diagnosis Date   Arthritis    Asthma    Breast cancer ([place])    Diabetes mellitus without complication ([place])    Hypertension    SBO (small bowel obstruction) ([place]) 06/2018    Past Surgical History:  Procedure Laterality Date   ABDOMINAL HYSTERECTOMY     THUMB AMPUTATION Left    PARTIAL   TONSILLECTOMY      Social history:  reports that she quit smoking about 29 years ago. Her smoking use included cigarettes. She has never used smokeless tobacco. She reports that she does not currently use alcohol. She reports that she does not currently use drugs.   Allergies  Allergen Reactions   Prozac [Fluoxetine Hcl] Other (See Comments)    Jitters   Safflower Oil Other (See Comments)    Unknown reaction Documented on MAR    Family History  Problem Relation Age of Onset   Hypertension Mother    Diabetes Mother    Cancer Father       Prior to Admission medications   Medication Sig Start Date End Date Taking? Authorizing Provider  acetaminophen (TYLENOL) 650 MG CR tablet Take 1 tablet (650 mg total) by mouth every 8 (eight) hours as needed for pain. 04/29/22  Yes Domenic Polite, MD  albuterol (ACCUNEB) 1.25 MG/3ML nebulizer solution Take 1 ampule by nebulization every 6 (six) hours as needed for wheezing.   Yes [provider]  Amino Acids-Protein Hydrolys (PRO-STAT) LIQD Take 30 mLs by mouth 2 (two) times daily.   Yes [provider]  barrier cream (NON-SPECIFIED) CREA Apply 1 Application topically See admin instructions. Apply topically to sacral area twice daily and with each incontinent episode. Zinc Barrier Cream.   Yes [provider]  bisacodyl (DULCOLAX) 10 MG suppository Place 10 mg rectally daily as needed (constipation not relived by Milk of Magnesia).   Yes [provider]  carboxymethylcellulose (REFRESH PLUS) 0.5 % SOLN Place 1 drop into both eyes daily as needed (dry eyes).   Yes [provider]  carvedilol (COREG) 3.125 MG tablet Take 1 tablet (3.125 mg total) by mouth 2 (two) times daily. 03/01/22  Yes Sabharwal, Aditya, DO  cyanocobalamin (VITAMIN B12) 1000 MCG/ML injection Inject 1,000 mcg into the muscle every 30 (thirty) days.   Yes [provider]  empagliflozin (JARDIANCE) 10 MG TABS tablet Take 1 tablet (10 mg total) by mouth daily. 01/13/22  Yes Simmons, Brittainy M, PA-C  Ergocalciferol (VITAMIN D2) 10 MCG (400 UNIT) TABS Take 800-1,200 Units by mouth See admin instructions. 2 entries on MAR: 1200 units once daily on Monday - Saturday + 800 units once daily on Sunday   Yes [provider]  escitalopram (LEXAPRO) 5 MG tablet Take 5 mg by mouth daily.   Yes [provider]  folic acid (FOLVITE) 1 MG tablet Take 1 mg by mouth daily.   Yes [provider]  furosemide (LASIX) 40 MG tablet Take 1 tablet (40 mg total) by mouth 2 (two) times daily. 02/04/22  Yes Sabharwal, Aditya, DO  glimepiride (AMARYL) 2 MG tablet Take 2 mg by mouth daily.   Yes [provider]  Glucerna Barrie Folk) LIQD Take 1 Can by mouth every evening.   Yes [provider]   insulin aspart (NOVOLOG) 100 UNIT/ML injection Inject 5 Units into the skin once.   Yes [provider]  Magnesium Hydroxide (MILK OF MAGNESIA PO) Take 30 mLs by mouth daily as needed (constipation).   Yes [provider]  montelukast (SINGULAIR) 10 MG tablet Take 10 mg by mouth daily at 12 noon.   Yes [provider]  NON FORMULARY 1 Device by Other route at bedtime. BIPAP.   Yes [provider]  OXYGEN Inhale 2 L/min into the lungs continuous.   Yes [provider]  potassium chloride SA (KLOR-CON M) 20 MEQ tablet Take 1 tablet (20 mEq total) by mouth daily. 03/26/22  Yes Sabharwal, Aditya, DO  predniSONE (DELTASONE) 10 MG tablet Take 1 tablet (10 mg total) by mouth daily with breakfast. Patient taking differently: Take 10 mg by mouth daily with breakfast. Continuous course. 04/29/22  Yes Domenic Polite, MD  PRESCRIPTION MEDICATION 1 Dose See admin instructions. Potassium Acetate 40 mEq/20 mL VL. Give 3 times daily for 1 week.   Yes [provider]  simvastatin (ZOCOR) 40 MG tablet Take 40 mg  by mouth at bedtime.   Yes [provider]  Sodium Phosphates (RA SALINE ENEMA RE) Place 1 enema rectally daily as needed (constipation not relived by bisacodyl suppository.).   Yes [provider]  spironolactone (ALDACTONE) 25 MG tablet Take 1 tablet (25 mg total) by mouth daily. 03/26/22  Yes Sabharwal, Aditya, DO  Witch Hazel (TUCKS EX) Apply 1 application  topically as needed (itching). Tucks 50% Medicated Cool Pads   Yes [provider]  nitrofurantoin, macrocrystal-monohydrate, (MACROBID) 100 MG capsule Take 100 mg by mouth 2 (two) times daily. 5 day course. Patient not taking: Reported on 05/26/2022    [provider]    Physical Exam: Vitals:   05/26/22 1230 05/26/22 1245 05/26/22 1300 05/26/22 1509  BP: 95/66 110/68 99/69 (!) 112/59  Pulse: (!) 101 100 100 (!) 101  Resp: 20   20  Temp:    97.7 F (36.5  C)  TempSrc:    Oral  SpO2: 100% 98% 97% 97%    Constitutional: No acute distress but appeared to be mumbling and intermittently moaning. Eyes: PERRL, lids and conjunctivae normal ENMT: Mucous membranes very dry, posterior pharynx clear of any exudate or lesions, poor dentition.  Neck: normal, supple, no masses, no thyromegaly Respiratory: clear to auscultation bilaterally, no wheezing, no crackles. Normal respiratory effort. No accessory muscle use.  Cardiovascular: Regular rate and rhythm, no murmurs / rubs / gallops. No extremity edema. 2+ pedal pulses. No carotid bruits.  Abdomen: Appears to have suprapubic and left lower quadrant tenderness tenderness, no masses palpated. No hepatosplenomegaly. Bowel sounds positive.  Musculoskeletal: no clubbing / cyanosis.  Reduced range of motion in lower extremities, no contractures. Normal muscle tone.  Neurologic: Oriented to person and place-stated she is in the hospital, partially oriented to time stated this was January 2023 initially and then stated January 2024.  Mumbled and tangential speech.  Moving upper extremities spontaneously.  Moves lower extremities only partially and not able to follow commands. Psychiatric: Impaired judgment and insight. Alert and oriented x 2 as discussed above.  Anxious mood.  SKIN/catheters: no acute rashes, has Port-A-Cath on anterior chest wall, no ulcers.  Labs on Admission: I have personally reviewed following labs and imaging studies  CBC: Recent Labs  Lab 05/26/22 0940 05/26/22 0957  WBC 14.1*  --   NEUTROABS 11.9*  --   HGB 11.6* 12.2  HCT 37.5 36.0  MCV 100.0  --   PLT 148*  --    Basic Metabolic Panel: Recent Labs  Lab 05/26/22 0940 05/26/22 0957  NA 141 140  K 3.9 3.8  CL 87*  --   CO2 42*  --   GLUCOSE 336*  --   BUN 17  --   CREATININE 1.16*  --   CALCIUM 9.0  --    GFR: CrCl cannot be calculated (Unknown ideal weight.). Recent Labs  Lab 05/26/22 0940  WBC 14.1*   Liver  Function Tests: Recent Labs  Lab 05/26/22 0940  AST 22  ALT 16  ALKPHOS 93  BILITOT 1.2  PROT 5.3*  ALBUMIN 2.4*   Recent Labs  Lab 05/26/22 0940  LIPASE 41   No results for input(s): "AMMONIA" in the last 168 hours. Coagulation Profile: No results for input(s): "INR", "PROTIME" in the last 168 hours. Cardiac Enzymes: No results for input(s): "CKTOTAL", "CKMB", "CKMBINDEX", "TROPONINI" in the last 168 hours. BNP (last 3 results) No results for input(s): "PROBNP" in the last 8760 hours. HbA1C: No results for input(s): "  HGBA1C" in the last 72 hours. CBG: Recent Labs  Lab 05/26/22 0922  GLUCAP 312*   Lipid Profile: No results for input(s): "CHOL", "HDL", "LDLCALC", "TRIG", "CHOLHDL", "LDLDIRECT" in the last 72 hours. Thyroid Function Tests: No results for input(s): "TSH", "T4TOTAL", "FREET4", "T3FREE", "THYROIDAB" in the last 72 hours. Anemia Panel: No results for input(s): "VITAMINB12", "FOLATE", "FERRITIN", "TIBC", "IRON", "RETICCTPCT" in the last 72 hours. Urine analysis:    Component Value Date/Time   COLORURINE YELLOW 05/26/2022 1245   APPEARANCEUR HAZY (A) 05/26/2022 1245   LABSPEC 1.023 05/26/2022 1245   PHURINE 8.0 05/26/2022 1245   GLUCOSEU >=500 (A) 05/26/2022 1245   HGBUR SMALL (A) 05/26/2022 1245   BILIRUBINUR NEGATIVE 05/26/2022 1245   KETONESUR NEGATIVE 05/26/2022 1245   PROTEINUR NEGATIVE 05/26/2022 1245   NITRITE NEGATIVE 05/26/2022 1245   LEUKOCYTESUR LARGE (A) 05/26/2022 1245    Radiological Exams on Admission: Personally reviewed  CT HEAD WO CONTRAST (5MM)  Result Date: 05/26/2022 CLINICAL DATA:  Head trauma. Mental status change urinary tract infection. EXAM: CT HEAD WITHOUT CONTRAST TECHNIQUE: Contiguous axial images were obtained from the base of the skull through the vertex without intravenous contrast. RADIATION DOSE REDUCTION: This exam was performed according to the departmental dose-optimization program which includes automated exposure  control, adjustment of the mA and/or kV according to patient size and/or use of iterative reconstruction technique. COMPARISON:  CT head dated April 22, 2022 FINDINGS: Brain: No evidence of acute infarction, hemorrhage, hydrocephalus, extra-axial collection or mass lesion/mass effect. Mild generalized cerebral atrophy and chronic microvascular ischemic changes of the white matter, unchanged. Vascular: No hyperdense vessel or unexpected calcification. Skull: Normal. Negative for fracture or focal lesion. Sinuses/Orbits: No acute finding. Other: None. IMPRESSION: 1. No acute intracranial abnormality. 2. Mild generalized cerebral atrophy and chronic microvascular ischemic changes of the white matter, unchanged. Electronically Signed   By: Keane Police D.O.   On: 05/26/2022 13:28   DG Chest Portable 1 View  Result Date: 05/26/2022 CLINICAL DATA:  Altered mental status. Urinary tract infection. Diabetes. EXAM: PORTABLE CHEST 1 VIEW COMPARISON:  04/23/2022 FINDINGS: Right-sided Port-A-Cath tip at low right atrium. Numerous leads and wires project over the chest. Right breast and axillary surgical clips. Midline trachea. Mild cardiomegaly. No pleural effusion or pneumothorax. No congestive failure. Low lung volumes. Improved left base aeration with mild airspace disease remaining. IMPRESSION: Cardiomegaly and low lung volumes. Improved left base aeration with minimal airspace disease remaining, favoring atelectasis. Right Port-A-Cath tip at low right atrium, as before. Electronically Signed   By: Abigail Miyamoto M.D.   On: 05/26/2022 09:35    EKG: Independently reviewed.  Sinus rhythm with LBBB     Assessment and Plan:   Principal Problem:   Acute metabolic encephalopathy Active Problems:   Essential hypertension   Sleep apnea   Type 2 diabetes mellitus (HCC)   Non-small cell lung cancer ([place])   Acute combined systolic and diastolic congestive heart failure (HCC)   Drug-induced pneumonitis    1.   Acute metabolic encephalopathy: Patient seems to have had gradual decline in mental status and functionality over the last few months.  Unclear how far she is from her new baseline although speech change appears to be relatively new..  Differentials include metabolic encephalopathy due to hypoglycemia versus UTI versus new stroke versus steroid-induced psychosis versus metastatic disease to brain versus worsening dementia.  Likely a combination of fall.  Will treat hyperglycemia, UTI.  CT head unremarkable with chronic atrophic changes.  Will obtain MRI  head with presedation-IV Benadryl (avoiding benzodiazepines).  2.  Hyperglycemia: In the setting of chronic prednisone use and possibly acute infection.  Will reduce prednisone to 5 mg as planned during last outpatient oncology visit.  Check hemoglobin A1c.  SSI and low-dose Lantus for now.  3.  Stage IV lung CA: Patient does have history of contralateral lung mets.  Per oncology notes from [facility], patient had reassuring PET scan in December 2023.  Been off Keytruda immunotherapy since last summer due to complications of pneumonitis and has been on prednisone taper since then.  Initially started on 40 mg prednisone and taper down to 10 mg in January with plan to taper further this month.  MRI to rule out brain mets given problem #1  4.  History of breast cancer: Status postlumpectomy and radiation treatment in 2007.  Followed by [facility] oncology.  5.  Leukocytosis: Secondary to chronic prednisone use versus infection.  Follow-up urine cultures send agree with empiric UTI treatment given abnormal UA.  Unclear if patient was in fact having diarrhea although she reports loose stools earlier today.  Will send stool studies to rule out C. difficile if recurrent diarrhea.  COVID and flu negative.  Will obtain blood cultures as well given presence of Port-A-Cath  6.  Dysarthria,?  Lower extremity weakness: According to daughter patient's functional mobility has  gradually declined and unable to determine the acuity of this finding.  Will obtain MRI head given altered mental status to rule out metastatic disease.  PET scan in December did not show any spinal mets.  Speech abnormality could be related to stroke/mets versus encephalopathy.  7.  Vitamin B12 deficiency: Resume home meds.  8.  Asthma/OSA  ?  Chronic respiratory failure: Resume home meds.  Unclear if patient also has history of COPD but according to ED physician came in with 2 L nasal cannula which is her baseline.  Need to clarify this information with daughter in AM.  Neb treatments.  Taper O2 as tolerated.  9.  Hypertension/hyperlipidemia/chronic combined systolic and diastolic CHF: Resume home meds but will reduce Lasix to 40 daily as patient appears very dry and concern for dehydration with slight bump in creatinine.  BMP in AM.  DVT prophylaxis: Lovenox  COVID screen: Negative  Code Status: DNR as confirmed with daughter Cheryln Manly   .Health care proxy would be her daughter Davy Pique.  Patient/Family Communication: Discussed with patient's daughter in detail regarding overall poor prognosis, care goals and all questions answered to satisfaction.  CODE STATUS now DNR and family open to palliative care discussions. Consults called: Palliative care Admission status :Patient will be admitted under OBSERVATION status.The patient's presenting symptoms, physical exam findings, and initial radiographic and laboratory data in the context of their medical condition is felt to place them at low risk for further clinical deterioration. Furthermore, it is anticipated that the patient will be medically stable for discharge from the hospital within 2 midnights of hospital stay.  If patient's condition worsens or significant findings on MRI or infectious disease workup, can consider upgrade to inpatient status     Guilford Shi MD [facility] Pager in [place]  If 7PM-7AM, please contact  night-coverage [email or web address]   05/26/2022, 5:21 PM

## 2022-05-26 NOTE — ED Provider Notes (Signed)
[facility] Provider Note   CSN: [id number] Arrival date & time: 05/26/22  2831     History Chief Complaint  Patient presents with   Altered Mental Status   Urinary Tract Infection   Hyperglycemia    HPI Charlene Houston is a 77 y.o. female presenting for altered mental status.  Patient comes by EMS from a skilled nursing facility.  They state that her baseline is GCS 15 but she is GCS 14 with confusion today.  Per EMS this started this morning.  Blood sugar was 540 in transit. .Patient unable to provide any collateral history at this time.  She does get her name correct but otherwise unable to answer questions. Per chart review extensive history of SBO, HLD, hypertension, diabetes, heart disease, chronic oxygen requirement of 2 L.  Patient's recorded medical, surgical, social, medication list and allergies were reviewed in the Snapshot window as part of the initial history.   Review of Systems   Review of Systems  Constitutional:  Negative for chills and fever.  HENT:  Negative for ear pain and sore throat.   Eyes:  Negative for pain and visual disturbance.  Respiratory:  Positive for shortness of breath. Negative for cough.   Cardiovascular:  Negative for chest pain and palpitations.  Gastrointestinal:  Negative for abdominal pain and vomiting.  Genitourinary:  Negative for dysuria and hematuria.  Musculoskeletal:  Negative for arthralgias and back pain.  Skin:  Negative for color change and rash.  Neurological:  Negative for seizures and syncope.  Psychiatric/Behavioral:  Positive for confusion.   All other systems reviewed and are negative.   Physical Exam Updated Vital Signs BP (!) 112/59   Pulse (!) 101   Temp 97.7 F (36.5 C) (Oral)   Resp 20   SpO2 97%  Physical Exam Vitals and nursing note reviewed.  Constitutional:      General: She is not in acute distress.    Appearance: She is well-developed.  HENT:      Head: Normocephalic and atraumatic.  Eyes:     Conjunctiva/sclera: Conjunctivae normal.  Cardiovascular:     Rate and Rhythm: Normal rate and regular rhythm.     Heart sounds: No murmur heard. Pulmonary:     Effort: Pulmonary effort is normal. No respiratory distress.     Breath sounds: Normal breath sounds.  Abdominal:     Palpations: Abdomen is soft.     Tenderness: There is no abdominal tenderness.  Musculoskeletal:        General: No swelling.     Cervical back: Neck supple.  Skin:    General: Skin is warm and dry.     Capillary Refill: Capillary refill takes less than 2 seconds.  Neurological:     Mental Status: She is alert.  Psychiatric:        Mood and Affect: Mood normal.      ED Course/ Medical Decision Making/ A&P    Procedures Procedures   Medications Ordered in ED Medications  acetaminophen (TYLENOL) CR tablet 650 mg (has no administration in time range)  carvedilol (COREG) tablet 3.125 mg (has no administration in time range)  furosemide (LASIX) tablet 40 mg (has no administration in time range)  simvastatin (ZOCOR) tablet 40 mg (has no administration in time range)  spironolactone (ALDACTONE) tablet 25 mg (has no administration in time range)  escitalopram (LEXAPRO) tablet 5 mg (has no administration in time range)  empagliflozin (JARDIANCE) tablet 10 mg (has  no administration in time range)  predniSONE (DELTASONE) tablet 10 mg (has no administration in time range)  folic acid (FOLVITE) tablet 1 mg (has no administration in time range)  Pro-Stat LIQD 30 mL (has no administration in time range)  Vitamin D2 TABS 800-1,200 Units (has no administration in time range)  Glucerna liquid 1 Can (has no administration in time range)  potassium chloride SA (KLOR-CON M) CR tablet 20 mEq (has no administration in time range)  albuterol (ACCUNEB) nebulizer solution 1.25 mg (has no administration in time range)  montelukast (SINGULAIR) tablet 10 mg (has no  administration in time range)  barrier cream (non-specified) 1 Application (has no administration in time range)  carboxymethylcellulose (REFRESH PLUS) 0.5 % ophthalmic solution 1 drop (has no administration in time range)  calcium gluconate 1 g/ 50 mL sodium chloride IVPB (has no administration in time range)  enoxaparin (LOVENOX) injection 40 mg (has no administration in time range)  ondansetron (ZOFRAN) tablet 4 mg (has no administration in time range)    Or  ondansetron (ZOFRAN) injection 4 mg (has no administration in time range)  hydrALAZINE (APRESOLINE) injection 5 mg (has no administration in time range)  cefTRIAXone (ROCEPHIN) 1 g in sodium chloride 0.9 % 100 mL IVPB (has no administration in time range)  lactated ringers bolus 500 mL (0 mLs Intravenous Stopped 05/26/22 1317)  cefTRIAXone (ROCEPHIN) 1 g in sodium chloride 0.9 % 100 mL IVPB (0 g Intravenous Stopped 05/26/22 1523)    Medical Decision Making:    Charlene Houston is a 77 y.o. female who presented to the ED today with altered mental status detailed above.     Patient's presentation is complicated by their history of multiple comorbid medical problems.  Patient placed on continuous vitals and telemetry monitoring while in ED which was reviewed periodically.   Complete initial physical exam performed, notably the patient  was GCS 14 grossly confused.  Only oriented to self.      Reviewed and confirmed nursing documentation for past medical history, family history, social history.    Initial Assessment:   With the patient's presentation of altered mental status, most likely diagnosis is delirium secondary to infectious etiology such as urinary tract infection. Other diagnoses were considered including (but not limited to) metabolic disturbance, critical anemia, intracranial hemorrhage, CVA. These are considered less likely due to history of present illness and physical exam findings.   This is most consistent with an  acute life/limb threatening illness complicated by underlying chronic conditions.  Initial Plan:  CT head to evaluate for structural intracranial etiology of patient's altered mental status Screening labs including CBC and Metabolic panel to evaluate for infectious or metabolic etiology of disease.  Urinalysis with reflex culture ordered to evaluate for UTI or relevant urologic/nephrologic pathology.  CXR to evaluate for structural/infectious intrathoracic pathology.  EKG to evaluate for cardiac pathology. Objective evaluation as below reviewed with plan for close reassessment  Initial Study Results:   Laboratory  All laboratory results reviewed without evidence of clinically relevant pathology.    EKG EKG was reviewed independently. Rate, rhythm, axis, intervals all examined and without medically relevant abnormality. ST segments without concerns for elevations.    Radiology  All images reviewed independently. Agree with radiology report at this time.   CT HEAD WO CONTRAST (5MM)  Result Date: 05/26/2022 CLINICAL DATA:  Head trauma. Mental status change urinary tract infection. EXAM: CT HEAD WITHOUT CONTRAST TECHNIQUE: Contiguous axial images were obtained from the base of the skull  through the vertex without intravenous contrast. RADIATION DOSE REDUCTION: This exam was performed according to the departmental dose-optimization program which includes automated exposure control, adjustment of the mA and/or kV according to patient size and/or use of iterative reconstruction technique. COMPARISON:  CT head dated April 22, 2022 FINDINGS: Brain: No evidence of acute infarction, hemorrhage, hydrocephalus, extra-axial collection or mass lesion/mass effect. Mild generalized cerebral atrophy and chronic microvascular ischemic changes of the white matter, unchanged. Vascular: No hyperdense vessel or unexpected calcification. Skull: Normal. Negative for fracture or focal lesion. Sinuses/Orbits: No acute  finding. Other: None. IMPRESSION: 1. No acute intracranial abnormality. 2. Mild generalized cerebral atrophy and chronic microvascular ischemic changes of the white matter, unchanged. Electronically Signed   By: Keane Police D.O.   On: 05/26/2022 13:28   DG Chest Portable 1 View  Result Date: 05/26/2022 CLINICAL DATA:  Altered mental status. Urinary tract infection. Diabetes. EXAM: PORTABLE CHEST 1 VIEW COMPARISON:  04/23/2022 FINDINGS: Right-sided Port-A-Cath tip at low right atrium. Numerous leads and wires project over the chest. Right breast and axillary surgical clips. Midline trachea. Mild cardiomegaly. No pleural effusion or pneumothorax. No congestive failure. Low lung volumes. Improved left base aeration with mild airspace disease remaining. IMPRESSION: Cardiomegaly and low lung volumes. Improved left base aeration with minimal airspace disease remaining, favoring atelectasis. Right Port-A-Cath tip at low right atrium, as before. Electronically Signed   By: Abigail Miyamoto M.D.   On: 05/26/2022 09:35     Final Assessment and Plan:   Reassessed at bedside.  After IV fluids and initiation of antibiotics, she still remains grossly confused.  Had a conversation with family.  They would want her to remain full scope of care at this time as well as full code.  She was arranged for admission for further care and management discussed with hospitalist agrees with need for admission.  Started on ceftriaxone for detected urinary tract infection with slightly elevated white blood cell count.  Disposition:   Based on the above findings, I believe this patient is stable for admission.    Patient/family educated about specific findings on our evaluation and explained exact reasons for admission.  Patient/family educated about clinical situation and time was allowed to answer questions.   Admission team communicated with and agreed with need for admission. Patient admitted. Patient ready to move at this time.      Emergency Department Medication Summary:   Medications  acetaminophen (TYLENOL) CR tablet 650 mg (has no administration in time range)  carvedilol (COREG) tablet 3.125 mg (has no administration in time range)  furosemide (LASIX) tablet 40 mg (has no administration in time range)  simvastatin (ZOCOR) tablet 40 mg (has no administration in time range)  spironolactone (ALDACTONE) tablet 25 mg (has no administration in time range)  escitalopram (LEXAPRO) tablet 5 mg (has no administration in time range)  empagliflozin (JARDIANCE) tablet 10 mg (has no administration in time range)  predniSONE (DELTASONE) tablet 10 mg (has no administration in time range)  folic acid (FOLVITE) tablet 1 mg (has no administration in time range)  Pro-Stat LIQD 30 mL (has no administration in time range)  Vitamin D2 TABS 800-1,200 Units (has no administration in time range)  Glucerna liquid 1 Can (has no administration in time range)  potassium chloride SA (KLOR-CON M) CR tablet 20 mEq (has no administration in time range)  albuterol (ACCUNEB) nebulizer solution 1.25 mg (has no administration in time range)  montelukast (SINGULAIR) tablet 10 mg (has no administration in time range)  barrier cream (non-specified) 1 Application (has no administration in time range)  carboxymethylcellulose (REFRESH PLUS) 0.5 % ophthalmic solution 1 drop (has no administration in time range)  calcium gluconate 1 g/ 50 mL sodium chloride IVPB (has no administration in time range)  enoxaparin (LOVENOX) injection 40 mg (has no administration in time range)  ondansetron (ZOFRAN) tablet 4 mg (has no administration in time range)    Or  ondansetron (ZOFRAN) injection 4 mg (has no administration in time range)  hydrALAZINE (APRESOLINE) injection 5 mg (has no administration in time range)  cefTRIAXone (ROCEPHIN) 1 g in sodium chloride 0.9 % 100 mL IVPB (has no administration in time range)  lactated ringers bolus 500 mL (0 mLs Intravenous  Stopped 05/26/22 1317)  cefTRIAXone (ROCEPHIN) 1 g in sodium chloride 0.9 % 100 mL IVPB (0 g Intravenous Stopped 05/26/22 1523)         Clinical Impression:  1. Altered mental status, unspecified altered mental status type      Admit   Final Clinical Impression(s) / ED Diagnoses Final diagnoses:  Altered mental status, unspecified altered mental status type    Rx / DC Orders ED Discharge Orders     None         Tretha Sciara, MD 05/26/22 1546

## 2022-05-26 NOTE — Plan of Care (Signed)
Problem: Education: Goal: Ability to describe self-care measures that may prevent or decrease complications (Diabetes Survival Skills Education) will improve 05/26/2022 2051 by Ancil Linsey, RN Outcome: Progressing 05/26/2022 2051 by Ancil Linsey, RN Outcome: Progressing Goal: Individualized Educational Video(s) 05/26/2022 2051 by Ancil Linsey, RN Outcome: Progressing 05/26/2022 2051 by Ancil Linsey, RN Outcome: Progressing   Problem: Coping: Goal: Ability to adjust to condition or change in health will improve 05/26/2022 2051 by Ancil Linsey, RN Outcome: Progressing 05/26/2022 2051 by Ancil Linsey, RN Outcome: Progressing   Problem: Fluid Volume: Goal: Ability to maintain a balanced intake and output will improve 05/26/2022 2051 by Ancil Linsey, RN Outcome: Progressing 05/26/2022 2051 by Ancil Linsey, RN Outcome: Progressing   Problem: Health Behavior/Discharge Planning: Goal: Ability to identify and utilize available resources and services will improve 05/26/2022 2051 by Ancil Linsey, RN Outcome: Progressing 05/26/2022 2051 by Ancil Linsey, RN Outcome: Progressing Goal: Ability to manage health-related needs will improve 05/26/2022 2051 by Ancil Linsey, RN Outcome: Progressing 05/26/2022 2051 by Ancil Linsey, RN Outcome: Progressing   Problem: Metabolic: Goal: Ability to maintain appropriate glucose levels will improve 05/26/2022 2051 by Ancil Linsey, RN Outcome: Progressing 05/26/2022 2051 by Ancil Linsey, RN Outcome: Progressing   Problem: Nutritional: Goal: Maintenance of adequate nutrition will improve 05/26/2022 2051 by Ancil Linsey, RN Outcome: Progressing 05/26/2022 2051 by Ancil Linsey, RN Outcome: Progressing Goal: Progress toward achieving an optimal weight will improve 05/26/2022 2051 by Ancil Linsey, RN Outcome: Progressing 05/26/2022 2051 by Ancil Linsey, RN Outcome: Progressing   Problem:  Skin Integrity: Goal: Risk for impaired skin integrity will decrease 05/26/2022 2051 by Ancil Linsey, RN Outcome: Progressing 05/26/2022 2051 by Ancil Linsey, RN Outcome: Progressing   Problem: Tissue Perfusion: Goal: Adequacy of tissue perfusion will improve 05/26/2022 2051 by Ancil Linsey, RN Outcome: Progressing 05/26/2022 2051 by Ancil Linsey, RN Outcome: Progressing   Problem: Education: Goal: Knowledge of General Education information will improve Description: Including pain rating scale, medication(s)/side effects and non-pharmacologic comfort measures 05/26/2022 2051 by Ancil Linsey, RN Outcome: Progressing 05/26/2022 2051 by Ancil Linsey, RN Outcome: Progressing   Problem: Health Behavior/Discharge Planning: Goal: Ability to manage health-related needs will improve 05/26/2022 2051 by Ancil Linsey, RN Outcome: Progressing 05/26/2022 2051 by Ancil Linsey, RN Outcome: Progressing   Problem: Clinical Measurements: Goal: Ability to maintain clinical measurements within normal limits will improve 05/26/2022 2051 by Ancil Linsey, RN Outcome: Progressing 05/26/2022 2051 by Ancil Linsey, RN Outcome: Progressing Goal: Will remain free from infection 05/26/2022 2051 by Ancil Linsey, RN Outcome: Progressing 05/26/2022 2051 by Ancil Linsey, RN Outcome: Progressing Goal: Diagnostic test results will improve 05/26/2022 2051 by Ancil Linsey, RN Outcome: Progressing 05/26/2022 2051 by Ancil Linsey, RN Outcome: Progressing Goal: Respiratory complications will improve 05/26/2022 2051 by Ancil Linsey, RN Outcome: Progressing 05/26/2022 2051 by Ancil Linsey, RN Outcome: Progressing Goal: Cardiovascular complication will be avoided 05/26/2022 2051 by Ancil Linsey, RN Outcome: Progressing 05/26/2022 2051 by Ancil Linsey, RN Outcome: Progressing   Problem: Activity: Goal: Risk for activity intolerance will  decrease 05/26/2022 2051 by Ancil Linsey, RN Outcome: Progressing 05/26/2022 2051 by Ancil Linsey, RN Outcome: Progressing   Problem: Nutrition: Goal: Adequate nutrition will be maintained 05/26/2022 2051 by Ancil Linsey, RN Outcome: Progressing 05/26/2022 2051 by Ancil Linsey, RN Outcome: Progressing  Problem: Coping: Goal: Level of anxiety will decrease 05/26/2022 2051 by Ancil Linsey, RN Outcome: Progressing 05/26/2022 2051 by Ancil Linsey, RN Outcome: Progressing   Problem: Elimination: Goal: Will not experience complications related to bowel motility 05/26/2022 2051 by Ancil Linsey, RN Outcome: Progressing 05/26/2022 2051 by Ancil Linsey, RN Outcome: Progressing Goal: Will not experience complications related to urinary retention 05/26/2022 2051 by Ancil Linsey, RN Outcome: Progressing 05/26/2022 2051 by Ancil Linsey, RN Outcome: Progressing   Problem: Pain Managment: Goal: General experience of comfort will improve 05/26/2022 2051 by Ancil Linsey, RN Outcome: Progressing 05/26/2022 2051 by Ancil Linsey, RN Outcome: Progressing   Problem: Safety: Goal: Ability to remain free from injury will improve 05/26/2022 2051 by Ancil Linsey, RN Outcome: Progressing 05/26/2022 2051 by Ancil Linsey, RN Outcome: Progressing   Problem: Skin Integrity: Goal: Risk for impaired skin integrity will decrease 05/26/2022 2051 by Ancil Linsey, RN Outcome: Progressing 05/26/2022 2051 by Ancil Linsey, RN Outcome: Progressing

## 2022-05-26 NOTE — Plan of Care (Signed)

## 2022-05-27 ENCOUNTER — Inpatient Hospital Stay (HOSPITAL_COMMUNITY): Payer: Medicare HMO

## 2022-05-27 ENCOUNTER — Observation Stay (HOSPITAL_COMMUNITY): Payer: Medicare HMO

## 2022-05-27 DIAGNOSIS — J9611 Chronic respiratory failure with hypoxia: Secondary | ICD-10-CM | POA: Diagnosis present

## 2022-05-27 DIAGNOSIS — F0392 Unspecified dementia, unspecified severity, with psychotic disturbance: Secondary | ICD-10-CM | POA: Diagnosis present

## 2022-05-27 DIAGNOSIS — B9689 Other specified bacterial agents as the cause of diseases classified elsewhere: Secondary | ICD-10-CM | POA: Diagnosis not present

## 2022-05-27 DIAGNOSIS — I5022 Chronic systolic (congestive) heart failure: Secondary | ICD-10-CM

## 2022-05-27 DIAGNOSIS — Z794 Long term (current) use of insulin: Secondary | ICD-10-CM | POA: Diagnosis not present

## 2022-05-27 DIAGNOSIS — R319 Hematuria, unspecified: Secondary | ICD-10-CM

## 2022-05-27 DIAGNOSIS — E860 Dehydration: Secondary | ICD-10-CM | POA: Diagnosis present

## 2022-05-27 DIAGNOSIS — E870 Hyperosmolality and hypernatremia: Secondary | ICD-10-CM | POA: Diagnosis present

## 2022-05-27 DIAGNOSIS — C7802 Secondary malignant neoplasm of left lung: Secondary | ICD-10-CM | POA: Diagnosis present

## 2022-05-27 DIAGNOSIS — D696 Thrombocytopenia, unspecified: Secondary | ICD-10-CM | POA: Diagnosis present

## 2022-05-27 DIAGNOSIS — Z7189 Other specified counseling: Secondary | ICD-10-CM

## 2022-05-27 DIAGNOSIS — Z1152 Encounter for screening for COVID-19: Secondary | ICD-10-CM | POA: Diagnosis not present

## 2022-05-27 DIAGNOSIS — Z66 Do not resuscitate: Secondary | ICD-10-CM

## 2022-05-27 DIAGNOSIS — L89313 Pressure ulcer of right buttock, stage 3: Secondary | ICD-10-CM | POA: Diagnosis present

## 2022-05-27 DIAGNOSIS — E669 Obesity, unspecified: Secondary | ICD-10-CM | POA: Diagnosis present

## 2022-05-27 DIAGNOSIS — J45909 Unspecified asthma, uncomplicated: Secondary | ICD-10-CM | POA: Diagnosis present

## 2022-05-27 DIAGNOSIS — G9341 Metabolic encephalopathy: Secondary | ICD-10-CM | POA: Diagnosis present

## 2022-05-27 DIAGNOSIS — E785 Hyperlipidemia, unspecified: Secondary | ICD-10-CM | POA: Diagnosis present

## 2022-05-27 DIAGNOSIS — E1165 Type 2 diabetes mellitus with hyperglycemia: Secondary | ICD-10-CM | POA: Diagnosis present

## 2022-05-27 DIAGNOSIS — N390 Urinary tract infection, site not specified: Secondary | ICD-10-CM

## 2022-05-27 DIAGNOSIS — L89323 Pressure ulcer of left buttock, stage 3: Secondary | ICD-10-CM | POA: Diagnosis present

## 2022-05-27 DIAGNOSIS — R4182 Altered mental status, unspecified: Secondary | ICD-10-CM

## 2022-05-27 DIAGNOSIS — I110 Hypertensive heart disease with heart failure: Secondary | ICD-10-CM | POA: Diagnosis present

## 2022-05-27 DIAGNOSIS — B961 Klebsiella pneumoniae [K. pneumoniae] as the cause of diseases classified elsewhere: Secondary | ICD-10-CM | POA: Diagnosis present

## 2022-05-27 DIAGNOSIS — E876 Hypokalemia: Secondary | ICD-10-CM | POA: Diagnosis present

## 2022-05-27 DIAGNOSIS — Z515 Encounter for palliative care: Secondary | ICD-10-CM

## 2022-05-27 DIAGNOSIS — G4730 Sleep apnea, unspecified: Secondary | ICD-10-CM | POA: Diagnosis present

## 2022-05-27 DIAGNOSIS — E871 Hypo-osmolality and hyponatremia: Secondary | ICD-10-CM | POA: Diagnosis present

## 2022-05-27 LAB — CBC
HCT: 38.6 % (ref 36.0–46.0)
Hemoglobin: 12.2 g/dL (ref 12.0–15.0)
MCH: 31.1 pg (ref 26.0–34.0)
MCHC: 31.6 g/dL (ref 30.0–36.0)
MCV: 98.5 fL (ref 80.0–100.0)
Platelets: 118 10*3/uL — ABNORMAL LOW (ref 150–400)
RBC: 3.92 MIL/uL (ref 3.87–5.11)
RDW: 14.6 % (ref 11.5–15.5)
WBC: 16.1 10*3/uL — ABNORMAL HIGH (ref 4.0–10.5)
nRBC: 1 % — ABNORMAL HIGH (ref 0.0–0.2)

## 2022-05-27 LAB — TROPONIN I (HIGH SENSITIVITY)
Troponin I (High Sensitivity): 123 ng/L (ref <18)
Troponin I (High Sensitivity): 189 ng/L (ref <18)

## 2022-05-27 LAB — BASIC METABOLIC PANEL
Anion gap: 25 — ABNORMAL HIGH (ref 5–15)
BUN: 19 mg/dL (ref 8–23)
CO2: 30 mmol/L (ref 22–32)
Calcium: 9 mg/dL (ref 8.9–10.3)
Chloride: 96 mmol/L — ABNORMAL LOW (ref 98–111)
Creatinine, Ser: 1.17 mg/dL — ABNORMAL HIGH (ref 0.44–1.00)
GFR, Estimated: 48 mL/min — ABNORMAL LOW (ref >60)
Glucose, Bld: 198 mg/dL — ABNORMAL HIGH (ref 70–99)
Potassium: 4.5 mmol/L (ref 3.5–5.1)
Sodium: 151 mmol/L — ABNORMAL HIGH (ref 135–145)

## 2022-05-27 LAB — GLUCOSE, CAPILLARY
Glucose-Capillary: 199 mg/dL — ABNORMAL HIGH (ref 70–99)
Glucose-Capillary: 232 mg/dL — ABNORMAL HIGH (ref 70–99)
Glucose-Capillary: 217 mg/dL — ABNORMAL HIGH (ref 70–99)
Glucose-Capillary: 375 mg/dL — ABNORMAL HIGH (ref 70–99)

## 2022-05-27 MED ORDER — FLUCONAZOLE 100 MG PO TABS
100.0000 mg | ORAL_TABLET | Freq: Every day | ORAL | Status: AC
  Administered 2022-05-27 – 2022-05-31 (×5): 100 mg via ORAL
  Filled 2022-05-27 (×5): qty 1

## 2022-05-27 MED ORDER — SODIUM CHLORIDE 0.45 % IV SOLN: INTRAVENOUS | Status: DC

## 2022-05-27 NOTE — Procedures (Signed)
Patient Name: Charlene Houston  MRN: [id number]  Epilepsy Attending: Lora Havens  Referring Physician/Provider: Bonnielee Haff, MD  Date: 05/27/2022 Duration: 26.10 mins  Patient history: 77yo F with ams. EEG to evaluate for seizure  Level of alertness: Awake  AEDs during EEG study: None  Technical aspects: This EEG study was done with scalp electrodes positioned according to the 10-20 International system of electrode placement. Electrical activity was reviewed with band pass filter of 1-70Hz , sensitivity of 7 uV/mm, display speed of 37mm/sec with a 60Hz  notched filter applied as appropriate. EEG data were recorded continuously and digitally stored.  Video monitoring was available and reviewed as appropriate.  Description: EEG showed continuous generalized 3 to 6 Hz theta-delta slowing.  Hyperventilation and photic stimulation were not performed.    Of note, study was technically difficult due to significant electrode and movement artifact.    ABNORMALITY - Continuous slow, generalized  IMPRESSION: This technically difficult study is suggestive of moderate diffuse encephalopathy, nonspecific etiology. No seizures or epileptiform discharges were seen throughout the recording.  Dominique Calvey [name]
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
## 2022-05-27 NOTE — Consult Note (Addendum)
Neurology Consultation  CC: presyncope  History is obtained from: patient's daughter  HPI: Charlene Houston is a 77 y.o. female with history h/o HTN, DM, arthritis, stage IIIA right breast cancer in 2007 s/p lumpectomy, asthma,?  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 Neurology was consulted due to concern for presyncope. Per patient's daughter, she reports the patient has been falling for "quite some time" although unsure when it started. Her most recent fall was November 29th. She does not believe that the patient has dizziness prior to her falls nor does she believe the patient loses consciousness. Since she has not witnessed these falls, she is not sure if patient has eye deviation or lip smacking after the falls. She describes the falls more so mechanical, saying "when she last in November, she was on her walker and she forgot to put the brakes on and the walker kept rolling so she leaned over and fell. She believes the patient's confusion started around 6 weeks ago and abnormal speech started last month when she was hospitalized for hyperglycemia, AMS, and CHF exacerbation. Prior to 6 weeks ago, she was able to converse. Around 6 weeks ago, patient's medications were adjusted and metformin and Jardiance were discontinued and coreg was started in which afterwards, she noticed drowsiness, and generalized weakness. Denies stroke and seizure history.  Attending addendum: The patient has had some subjective memory complaints for about a year, but her worsening really started sometime towards the end of last year.  A couple of months ago, it was noticed that she developed a tremor of her hands which caused her to ask for assistance with filling her pill cases.  Previously she had done this on  her own.  She has managed her own finances until the past 6 weeks.  Meals were taken care of by having easily preparable things so that she did not have to cook.  In November her son moved in with her, and there was some report of her being persistently somnolent during that timeframe and not doing much.  Over the past 6 weeks, her decline has accelerated.  Of note, she was previously on prednisone at a fairly high dose of 40 mg which was tapered down over a few months to 10 mg in January, she is currently on 5 mg daily.  Due to a abrupt worsening of her mental status she was brought back to the emergency department, thus far MRI has been negative, but UA does show urinary tract infection.  Her daughter also notes intermittent episodes of behavioral arrest which she states are happening at least once during almost all of her visits.  She states that she just stares off into space and then will not talk for few seconds to a minute and then will begin interacting again subsequently.  ROS: Unable to obtain due to altered mental status.   Past Medical History:  Diagnosis Date   Arthritis    Asthma    Breast cancer ([place])    Diabetes mellitus without complication ([place])    Hypertension    SBO (small bowel obstruction) ([place]) 06/2018     Family History  Problem Relation Age of Onset   Hypertension Mother    Diabetes Mother    Cancer Father      Social History:  reports that she quit  smoking about 29 years ago. Her smoking use included cigarettes. She has never used smokeless tobacco. She reports that she does not currently use alcohol. She reports that she does not currently use drugs.   Prior to Admission medications   Medication Sig Start Date End Date Taking? Authorizing Provider  acetaminophen (TYLENOL) 650 MG CR tablet Take 1 tablet (650 mg total) by mouth every 8 (eight) hours as needed for pain. 04/29/22  Yes Domenic Polite, MD  albuterol (ACCUNEB) 1.25 MG/3ML nebulizer solution Take 1  ampule by nebulization every 6 (six) hours as needed for wheezing.   Yes [provider]  Amino Acids-Protein Hydrolys (PRO-STAT) LIQD Take 30 mLs by mouth 2 (two) times daily.   Yes [provider]  barrier cream (NON-SPECIFIED) CREA Apply 1 Application topically See admin instructions. Apply topically to sacral area twice daily and with each incontinent episode. Zinc Barrier Cream.   Yes [provider]  bisacodyl (DULCOLAX) 10 MG suppository Place 10 mg rectally daily as needed (constipation not relived by Milk of Magnesia).   Yes [provider]  carboxymethylcellulose (REFRESH PLUS) 0.5 % SOLN Place 1 drop into both eyes daily as needed (dry eyes).   Yes [provider]  carvedilol (COREG) 3.125 MG tablet Take 1 tablet (3.125 mg total) by mouth 2 (two) times daily. 03/01/22  Yes Sabharwal, Aditya, DO  cyanocobalamin (VITAMIN B12) 1000 MCG/ML injection Inject 1,000 mcg into the muscle every 30 (thirty) days.   Yes [provider]  empagliflozin (JARDIANCE) 10 MG TABS tablet Take 1 tablet (10 mg total) by mouth daily. 01/13/22  Yes Simmons, Brittainy M, PA-C  Ergocalciferol (VITAMIN D2) 10 MCG (400 UNIT) TABS Take 800-1,200 Units by mouth See admin instructions. 2 entries on MAR: 1200 units once daily on Monday - Saturday + 800 units once daily on Sunday   Yes [provider]  escitalopram (LEXAPRO) 5 MG tablet Take 5 mg by mouth daily.   Yes [provider]  folic acid (FOLVITE) 1 MG tablet Take 1 mg by mouth daily.   Yes [provider]  furosemide (LASIX) 40 MG tablet Take 1 tablet (40 mg total) by mouth 2 (two) times daily. 02/04/22  Yes Sabharwal, Aditya, DO  glimepiride (AMARYL) 2 MG tablet Take 2 mg by mouth daily.   Yes [provider]  Glucerna Barrie Folk) LIQD Take 1 Can by mouth every evening.   Yes [provider]  insulin aspart (NOVOLOG) 100 UNIT/ML injection Inject 5 Units into the skin  once.   Yes [provider]  Magnesium Hydroxide (MILK OF MAGNESIA PO) Take 30 mLs by mouth daily as needed (constipation).   Yes [provider]  montelukast (SINGULAIR) 10 MG tablet Take 10 mg by mouth daily at 12 noon.   Yes [provider]  NON FORMULARY 1 Device by Other route at bedtime. BIPAP.   Yes [provider]  OXYGEN Inhale 2 L/min into the lungs continuous.   Yes [provider]  potassium chloride SA (KLOR-CON M) 20 MEQ tablet Take 1 tablet (20 mEq total) by mouth daily. 03/26/22  Yes Sabharwal, Aditya, DO  predniSONE (DELTASONE) 10 MG tablet Take 1 tablet (10 mg total) by mouth daily with breakfast. Patient taking differently: Take 10 mg by mouth daily with breakfast. Continuous course. 04/29/22  Yes Domenic Polite, MD  PRESCRIPTION MEDICATION 1 Dose See admin instructions. Potassium Acetate 40 mEq/20 mL VL. Give 3 times daily for 1 week.   Yes [provider]  simvastatin (ZOCOR) 40 MG tablet Take 40 mg by mouth at bedtime.   Yes [provider]  Sodium Phosphates (RA SALINE ENEMA RE) Place 1 enema rectally daily as needed (constipation not relived by bisacodyl suppository.).   Yes [provider]  spironolactone (ALDACTONE) 25 MG tablet Take 1 tablet (25 mg total) by mouth daily. 03/26/22  Yes Sabharwal, Aditya, DO  Witch Hazel (TUCKS EX) Apply 1 application  topically as needed (itching). Tucks 50% Medicated Cool Pads   Yes [provider]  nitrofurantoin, macrocrystal-monohydrate, (MACROBID) 100 MG capsule Take 100 mg by mouth 2 (two) times daily. 5 day course. Patient not taking: Reported on 05/26/2022    [provider]     Exam: Current vital signs: BP 122/86 (BP Location: Left Arm)   Pulse 90   Temp 98.7 F (37.1 C) (Axillary)   Resp 16   SpO2 100%    Physical Exam  Constitutional: Appears well-developed and well-nourished.  Psych: Affect appropriate to situation Eyes: No  scleral injection HENT: No OP obstrucion Head: Normocephalic.  Cardiovascular: Normal rate and regular rhythm.  Respiratory: Effort normal, non-labored breathing GI: Soft.  No distension. There is no tenderness.  Skin: WDI  Neuro: Mental Status: Patient is awake but very confused, with repeated prompting I am able to get her to say her name, but she does not answer any other orientation questions.  She does not reliably follow commands.  Patient not able to give a clear and coherent history.  Cranial Nerves: FV:CBSWHQ are equal, round, and reactive to light.  Blinks to threat bilaterally. III,IV, VI: EOMI without ptosis or diplopia.  VII: Facial movement is symmetric.  VIII: Hearing is intact to voice XII: Tongue is midline without atrophy or fasciculations.  Motor: Tone is normal. Bulk is normal.  Though she does not cooperate well with formal strength testing, she moves all her extremities relatively well and symmetrically.  She does have a coarse postural tremor. Sensory: Unable to assess Deep Tendon Reflexes: 2+ and symmetric in the biceps and patellae.  Plantars: Toes are downgoing bilaterally.   I have reviewed labs in epic and the pertinent results are:   Results for orders placed or performed during the hospital encounter of 05/26/22 (from the past 48 hour(s))  Resp panel by RT-PCR (RSV, Flu A&B, Covid) Anterior Nasal Swab     Status: None   Collection Time: 05/26/22  9:17 AM   Specimen: Anterior Nasal Swab  Result Value Ref Range   SARS Coronavirus 2 by RT PCR NEGATIVE NEGATIVE   Influenza A by PCR NEGATIVE NEGATIVE   Influenza B by PCR NEGATIVE NEGATIVE    Comment: (NOTE) The Xpert Xpress SARS-CoV-2/FLU/RSV plus assay is intended as an aid in the diagnosis of influenza from Nasopharyngeal swab specimens and should not be used as a sole basis for treatment. Nasal washings and aspirates are unacceptable for Xpert Xpress SARS-CoV-2/FLU/RSV testing.  Fact Sheet for  Patients: EntrepreneurPulse.com.au  Fact Sheet for Healthcare Providers: IncredibleEmployment.be  This test is not yet approved or cleared by the [place] FDA and has been authorized for detection and/or diagnosis of SARS-CoV-2 by FDA under an Emergency Use Authorization (EUA). This EUA will remain in effect (meaning this test can be used) for the duration of the COVID-19 declaration under Section 564(b)(1) of the Act, 21 U.S.C. section 360bbb-3(b)(1), unless the authorization is terminated or revoked.     Resp Syncytial Virus by PCR NEGATIVE NEGATIVE    Comment: (  NOTE) Fact Sheet for Patients: EntrepreneurPulse.com.au  Fact Sheet for Healthcare Providers: IncredibleEmployment.be  This test is not yet approved or cleared by the [place] FDA and has been authorized for detection and/or diagnosis of SARS-CoV-2 by FDA under an Emergency Use Authorization (EUA). This EUA will remain in effect (meaning this test can be used) for the duration of the COVID-19 declaration under Section 564(b)(1) of the Act, 21 U.S.C. section 360bbb-3(b)(1), unless the authorization is terminated or revoked.  Performed at [facility], [place] [address], [place], [place] [address]   CBG monitoring, ED     Status: Abnormal   Collection Time: 05/26/22  9:22 AM  Result Value Ref Range   Glucose-Capillary 312 (H) 70 - 99 mg/dL    Comment: Glucose reference range applies only to samples taken after fasting for at least 8 hours.  CBC with Differential     Status: Abnormal   Collection Time: 05/26/22  9:40 AM  Result Value Ref Range   WBC 14.1 (H) 4.0 - 10.5 K/uL   RBC 3.75 (L) 3.87 - 5.11 MIL/uL   Hemoglobin 11.6 (L) 12.0 - 15.0 g/dL   HCT 37.5 36.0 - 46.0 %   MCV 100.0 80.0 - 100.0 fL   MCH 30.9 26.0 - 34.0 pg   MCHC 30.9 30.0 - 36.0 g/dL   RDW 14.5 11.5 - 15.5 %   Platelets 148 (L) 150 - 400 K/uL   nRBC 1.1 (H)  0.0 - 0.2 %   Neutrophils Relative % 85 %   Neutro Abs 11.9 (H) 1.7 - 7.7 K/uL   Lymphocytes Relative 8 %   Lymphs Abs 1.1 0.7 - 4.0 K/uL   Monocytes Relative 6 %   Monocytes Absolute 0.8 0.1 - 1.0 K/uL   Eosinophils Relative 0 %   Eosinophils Absolute 0.1 0.0 - 0.5 K/uL   Basophils Relative 0 %   Basophils Absolute 0.0 0.0 - 0.1 K/uL   Immature Granulocytes 1 %   Abs Immature Granulocytes 0.19 (H) 0.00 - 0.07 K/uL    Comment: Performed at [facility], [address] [place], [place] [address]  Comprehensive metabolic panel     Status: Abnormal   Collection Time: 05/26/22  9:40 AM  Result Value Ref Range   Sodium 141 135 - 145 mmol/L   Potassium 3.9 3.5 - 5.1 mmol/L   Chloride 87 (L) 98 - 111 mmol/L   CO2 42 (H) 22 - 32 mmol/L   Glucose, Bld 336 (H) 70 - 99 mg/dL    Comment: Glucose reference range applies only to samples taken after fasting for at least 8 hours.   BUN 17 8 - 23 mg/dL   Creatinine, Ser 1.16 (H) 0.44 - 1.00 mg/dL   Calcium 9.0 8.9 - 10.3 mg/dL   Total Protein 5.3 (L) 6.5 - 8.1 g/dL   Albumin 2.4 (L) 3.5 - 5.0 g/dL   AST 22 15 - 41 U/L   ALT 16 0 - 44 U/L   Alkaline Phosphatase 93 38 - 126 U/L   Total Bilirubin 1.2 0.3 - 1.2 mg/dL   GFR, Estimated 49 (L) >60 mL/min    Comment: (NOTE) Calculated using the CKD-EPI Creatinine Equation (2021)    Anion gap 12 5 - 15    Comment: Performed at [facility], [place] [address] [place], [place] [address]  Lipase, blood     Status: None   Collection Time: 05/26/22  9:40 AM  Result Value Ref Range  Lipase 41 11 - 51 U/L    Comment: Performed at [facility], [place] [address] [place], [place] [address]  Beta-hydroxybutyric acid     Status: Abnormal   Collection Time: 05/26/22  9:40 AM  Result Value Ref Range   Beta-Hydroxybutyric Acid 0.53 (H) 0.05 - 0.27 mmol/L    Comment: Performed at [place] [address] [place], [place] [address]  I-Stat venous blood gas, [facility], [facility], [facility])      Status: Abnormal   Collection Time: 05/26/22  9:57 AM  Result Value Ref Range   pH, Ven 7.511 (H) 7.25 - 7.43   pCO2, Ven 60.6 (H) 44 - 60 mmHg   pO2, Ven 52 (H) 32 - 45 mmHg   Bicarbonate 48.4 (H) 20.0 - 28.0 mmol/L   TCO2 >50 (H) 22 - 32 mmol/L   O2 Saturation 88 %   Acid-Base Excess 22.0 (H) 0.0 - 2.0 mmol/L   Sodium 140 135 - 145 mmol/L   Potassium 3.8 3.5 - 5.1 mmol/L   Calcium, Ion 1.10 (L) 1.15 - 1.40 mmol/L   HCT 36.0 36.0 - 46.0 %   Hemoglobin 12.2 12.0 - 15.0 g/dL   Sample type VENOUS   Urinalysis, Routine w reflex microscopic -Urine, Clean Catch     Status: Abnormal   Collection Time: 05/26/22 12:45 PM  Result Value Ref Range   Color, Urine YELLOW YELLOW   APPearance HAZY (A) CLEAR   Specific Gravity, Urine 1.023 1.005 - 1.030   pH 8.0 5.0 - 8.0   Glucose, UA >=500 (A) NEGATIVE mg/dL   Hgb urine dipstick SMALL (A) NEGATIVE   Bilirubin Urine NEGATIVE NEGATIVE   Ketones, ur NEGATIVE NEGATIVE mg/dL   Protein, ur NEGATIVE NEGATIVE mg/dL   Nitrite NEGATIVE NEGATIVE   Leukocytes,Ua LARGE (A) NEGATIVE   RBC / HPF 21-50 0 - 5 RBC/hpf   WBC, UA >50 0 - 5 WBC/hpf   Bacteria, UA FEW (A) NONE SEEN   Squamous Epithelial / HPF 0-5 0 - 5 /HPF   Mucus PRESENT    Budding Yeast PRESENT     Comment: Performed at [facility], [place] [address] [place], [place] [address]  Blood culture (routine x 2)     Status: None (Preliminary result)   Collection Time: 05/26/22  2:06 PM   Specimen: BLOOD RIGHT HAND  Result Value Ref Range   Specimen Description BLOOD RIGHT HAND    Special Requests      BOTTLES DRAWN AEROBIC AND ANAEROBIC Blood Culture adequate volume   Culture      NO GROWTH < 24 HOURS Performed at [facility], [place] [address] [place], [place] [address]    Report Status PENDING   Glucose, capillary     Status: Abnormal   Collection Time: 05/26/22  5:29 PM  Result Value Ref Range   Glucose-Capillary 302 (H) 70 - 99 mg/dL    Comment: Glucose reference range  applies only to samples taken after fasting for at least 8 hours.  CBC     Status: Abnormal   Collection Time: 05/26/22  6:04 PM  Result Value Ref Range   WBC 15.4 (H) 4.0 - 10.5 K/uL   RBC 3.92 3.87 - 5.11 MIL/uL   Hemoglobin 12.2 12.0 - 15.0 g/dL   HCT 39.9 36.0 - 46.0 %   MCV 101.8 (H) 80.0 - 100.0 fL   MCH 31.1 26.0 - 34.0 pg   MCHC 30.6 30.0 -  36.0 g/dL   RDW 14.6 11.5 - 15.5 %   Platelets 114 (L) 150 - 400 K/uL    Comment: REPEATED TO VERIFY   nRBC 0.7 (H) 0.0 - 0.2 %    Comment: Performed at [facility], [place] [address] [place], [place] [address]  Creatinine, serum     Status: Abnormal   Collection Time: 05/26/22  6:04 PM  Result Value Ref Range   Creatinine, Ser 1.08 (H) 0.44 - 1.00 mg/dL   GFR, Estimated 53 (L) >60 mL/min    Comment: (NOTE) Calculated using the CKD-EPI Creatinine Equation (2021) Performed at [place] [address], [place], [place] [address]   Hemoglobin A1c     Status: Abnormal   Collection Time: 05/26/22  6:04 PM  Result Value Ref Range   Hgb A1c MFr Bld 8.3 (H) 4.8 - 5.6 %    Comment: (NOTE) Pre diabetes:          5.7%-6.4%  Diabetes:              >6.4%  Glycemic control for   <7.0% adults with diabetes    Mean Plasma Glucose 191.51 mg/dL    Comment: Performed at [place] [address] [place], [place] [address]  Blood culture (routine x 2)     Status: None (Preliminary result)   Collection Time: 05/26/22  6:05 PM   Specimen: BLOOD  Result Value Ref Range   Specimen Description BLOOD BLOOD RIGHT HAND    Special Requests      BOTTLES DRAWN AEROBIC AND ANAEROBIC Blood Culture adequate volume   Culture      NO GROWTH < 12 HOURS Performed at [facility], [place] [address], [place], [place] [address]    Report Status PENDING   Glucose, capillary     Status: Abnormal   Collection Time: 05/26/22  9:07 PM  Result Value Ref Range   Glucose-Capillary 231 (H) 70 - 99 mg/dL    Comment: Glucose reference range  applies only to samples taken after fasting for at least 8 hours.   Comment 1 Notify RN    Comment 2 Document in Chart   Basic metabolic panel     Status: Abnormal   Collection Time: 05/27/22  1:08 AM  Result Value Ref Range   Sodium 151 (H) 135 - 145 mmol/L    Comment: RESULTS VERIFIED BY REPEAT TESTING   Potassium 4.5 3.5 - 5.1 mmol/L    Comment: HEMOLYSIS AT THIS LEVEL MAY AFFECT RESULT   Chloride 96 (L) 98 - 111 mmol/L   CO2 30 22 - 32 mmol/L   Glucose, Bld 198 (H) 70 - 99 mg/dL    Comment: Glucose reference range applies only to samples taken after fasting for at least 8 hours.   BUN 19 8 - 23 mg/dL   Creatinine, Ser 1.17 (H) 0.44 - 1.00 mg/dL   Calcium 9.0 8.9 - 10.3 mg/dL   GFR, Estimated 48 (L) >60 mL/min    Comment: (NOTE) Calculated using the CKD-EPI Creatinine Equation (2021)    Anion gap 25 (H) 5 - 15    Comment: ELECTROLYTES REPEATED TO VERIFY Performed at [facility] [address], [place] [address]   CBC     Status: Abnormal   Collection Time: 05/27/22  1:08 AM  Result Value Ref Range   WBC 16.1 (H) 4.0 - 10.5 K/uL   RBC 3.92 3.87 - 5.11 MIL/uL  Hemoglobin 12.2 12.0 - 15.0 g/dL   HCT 38.6 36.0 - 46.0 %   MCV 98.5 80.0 - 100.0 fL   MCH 31.1 26.0 - 34.0 pg   MCHC 31.6 30.0 - 36.0 g/dL   RDW 14.6 11.5 - 15.5 %   Platelets 118 (L) 150 - 400 K/uL   nRBC 1.0 (H) 0.0 - 0.2 %    Comment: Performed at [facility] [address], [place], [place] [address]  Troponin I (High Sensitivity)     Status: Abnormal   Collection Time: 05/27/22  1:08 AM  Result Value Ref Range   Troponin I (High Sensitivity) 123 (HH) <18 ng/L    Comment: CRITICAL RESULT CALLED TO, READ BACK BY AND VERIFIED WITH NANCY O'LEARY (NOTE) Elevated high sensitivity troponin I (hsTnI) values and significant  changes across serial measurements may suggest ACS but many other  chronic and acute conditions are known to elevate hsTnI results.  Refer to the "Links" section for  chest pain algorithms and additional  guidance. Performed at [facility], [place] [address], [place], [place] [address]   Troponin I (High Sensitivity)     Status: Abnormal   Collection Time: 05/27/22  5:47 AM  Result Value Ref Range   Troponin I (High Sensitivity) 189 (HH) <18 ng/L    Comment: CRITICAL VALUE NOTED. VALUE IS CONSISTENT WITH PREVIOUSLY REPORTED/CALLED VALUE (NOTE) Elevated high sensitivity troponin I (hsTnI) values and significant  changes across serial measurements may suggest ACS but many other  chronic and acute conditions are known to elevate hsTnI results.  Refer to the "Links" section for chest pain algorithms and additional  guidance. Performed at [facility], [place] [address] [place] [address], [place], [place] [address]   Glucose, capillary     Status: Abnormal   Collection Time: 05/27/22  6:36 AM  Result Value Ref Range   Glucose-Capillary 199 (H) 70 - 99 mg/dL    Comment: Glucose reference range applies only to samples taken after fasting for at least 8 hours.  Glucose, capillary     Status: Abnormal   Collection Time: 05/27/22 11:43 AM  Result Value Ref Range   Glucose-Capillary 232 (H) 70 - 99 mg/dL    Comment: Glucose reference range applies only to samples taken after fasting for at least 8 hours.    MR BRAIN WO CONTRAST  Result Date: 05/27/2022 CLINICAL DATA:  Altered mental status. History of lung cancer and breast cancer. EXAM: MRI HEAD WITHOUT CONTRAST TECHNIQUE: Multiplanar, multiecho pulse sequences of the brain and surrounding structures were obtained without intravenous contrast. COMPARISON:  CT head 05/26/2022, brain MRI 06/09/2021 FINDINGS: Image quality is motion degraded. Additionally, evaluation for small metastatic lesions is limited in the absence of intravenous contrast. Brain: There is no acute intracranial hemorrhage, extra-axial fluid collection, or acute infarct. There is mild parenchymal volume loss with prominence of the ventricular system  and extra-axial CSF spaces, within expected limits for age. The ventricles are normal in size. Parenchymal signal is normal for age, with minimal periventricular FLAIR signal abnormality. The pituitary and suprasellar region are normal. No definite mass lesion is seen to suggest intracranial metastatic disease. There is no mass effect or midline shift. Vascular: Normal flow voids. Skull and upper cervical spine: Normal marrow signal. Sinuses/Orbits: The paranasal sinuses are clear. Bilateral lens implants are in place. The globes and orbits are otherwise unremarkable. Other: A left mastoid effusion is again seen. The imaged nasopharynx is unremarkable. IMPRESSION: No definite evidence of  acute intracranial pathology or intracranial metastatic disease, within the confines of motion degraded images and noncontrast technique. Electronically Signed   By: Valetta Mole M.D.   On: 05/27/2022 09:52   CT HEAD WO CONTRAST (5MM)  Result Date: 05/26/2022 CLINICAL DATA:  Head trauma. Mental status change urinary tract infection. EXAM: CT HEAD WITHOUT CONTRAST TECHNIQUE: Contiguous axial images were obtained from the base of the skull through the vertex without intravenous contrast. RADIATION DOSE REDUCTION: This exam was performed according to the departmental dose-optimization program which includes automated exposure control, adjustment of the mA and/or kV according to patient size and/or use of iterative reconstruction technique. COMPARISON:  CT head dated April 22, 2022 FINDINGS: Brain: No evidence of acute infarction, hemorrhage, hydrocephalus, extra-axial collection or mass lesion/mass effect. Mild generalized cerebral atrophy and chronic microvascular ischemic changes of the white matter, unchanged. Vascular: No hyperdense vessel or unexpected calcification. Skull: Normal. Negative for fracture or focal lesion. Sinuses/Orbits: No acute finding. Other: None. IMPRESSION: 1. No acute intracranial abnormality. 2. Mild  generalized cerebral atrophy and chronic microvascular ischemic changes of the white matter, unchanged. Electronically Signed   By: Keane Police D.O.   On: 05/26/2022 13:28   DG Chest Portable 1 View  Result Date: 05/26/2022 CLINICAL DATA:  Altered mental status. Urinary tract infection. Diabetes. EXAM: PORTABLE CHEST 1 VIEW COMPARISON:  04/23/2022 FINDINGS: Right-sided Port-A-Cath tip at low right atrium. Numerous leads and wires project over the chest. Right breast and axillary surgical clips. Midline trachea. Mild cardiomegaly. No pleural effusion or pneumothorax. No congestive failure. Low lung volumes. Improved left base aeration with mild airspace disease remaining. IMPRESSION: Cardiomegaly and low lung volumes. Improved left base aeration with minimal airspace disease remaining, favoring atelectasis. Right Port-A-Cath tip at low right atrium, as before. Electronically Signed   By: Abigail Miyamoto M.D.   On: 05/26/2022 09:35      Primary Diagnosis:  Subacute encephalopathy  Coralyn Pear, MD PGY-1 Psychiatry  I have seen the patient and reviewed the above note.  Impression: 77 year old female with progressive decline in multiple aspects including mentation over the past few months.  The cause of her decline has not been clear, but the differential is relatively broad.  It is possible that she has some underlying mild dementia and her subsequent medical decline has resulted in worsening of her underlying deficits, but this is by no means definite.  With a history of stage IV cancer, certainly paraneoplastic syndromes would be a significant consideration and fairly rapid mental decline.  One thing I do note is that it is possible she was having a partially treated autoimmune process with high doses of steroids and her subsequent decline corresponded to tapering of her steroids.  The episodes of behavioral arrest are concerning for seizure, though these could be behavioral as well.  I do think a  continuous EEG to further characterize these episodes and hopefully to capture one would be prudent.  With her not eating or drinking well, nutritional decline is also a significant consideration and I would favor assessing for vitamin deficiency.  Hypothyroidism can present in this fashion as well and we will need to check a TSH.  Recommendations: -Continuous EEG -TSH, ammonia, B1, B12 -Will start B1 repletion following checking -Neurology will continue to follow  Roland Rack, MD [facility] [phone]  If 7pm- 7am, please page neurology on call as listed in [place].

## 2022-05-27 NOTE — Progress Notes (Signed)
EEG complete - results pending 

## 2022-05-27 NOTE — Progress Notes (Signed)
[facility] PROGRESS NOTE   Charlene Houston OXB:[id number] DOB: 1945-10-28 DOA: 05/26/2022  PCP: Lilian Coma., MD  Brief History/Interval Summary: 77 y.o. female with history h/o HTN, DM, arthritis, stage IIIA right breast cancer in 2007 s/p lumpectomy, asthma,?  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  According to the admitting providers conversation with daughter patient has been declining even her mental status over the last 6 to 8 months.  Patient was found to have abnormal UA.  She was hospitalized for further management.    Consultants: Will consult palliative medicine for goals of care conversation  Procedures: None yet    Subjective/Interval History: Patient is awake but distracted.  Follows certain commands but does not answer any questions.    Assessment/Plan:  Acute metabolic encephalopathy Reason for her mental status change is unclear but probably due to UTI.  However daughter also mentioned that she has had a decline over the past few months. CT head did not show any acute findings.  MRI brain is pending.  No obvious focal neurological deficits noted. Continue to treat UTI. Will complete metabolic workup as well.  She is noted to have B12 deficiency based on level of 124 back in 2022.  Supposed to be on B12 injections every month.  No TSH in our system. She will need a swallow eval.  Urinary tract infection Follow-up urine cultures.  Continue with ceftriaxone.  Chronic systolic CHF EF is known to be  about 35%.  Followed by heart failure clinic.  Noted to be on furosemide with show will be held since patient is quite dehydrated and has hypernatremia.  Hold her spironolactone as well.  Home medication is also shows that she is supposed to be on carvedilol and Jardiance.  Dehydration/hyponatremia Likely due to poor oral intake.  Will stop her furosemide.  Give her IV fluids for 24 hours.  Caution due to history of CHF.  Diabetes mellitus type 2, uncontrolled with hyperglycemia Likely due to steroid use.  HbA1c found to be 8.3. She is noted to be on Jardiance prior to admission.  Also on glimepiride Monitor CBGs.  Continue SSI.  Also noted to be on glargine.  History of stage IV lung cancer Followed by oncology in [facility].  According to oncology notes patient had reassuring PET scan in December 2023.  She has been off of Keytruda since last summer due to complications of pneumonitis.  Has been on prednisone for same and has been tapered down gradually.  Reduced to 5 mg.  Previous history of breast cancer She is status postlumpectomy and radiation treatment in 2007.  Leukocytosis Likely due to chronic prednisone use.  Could also be due to UTI.  Follow-up on blood cultures.  She does have a Port-A-Cath.  Vitamin B12 deficiency Will recheck levels.  Apparently supposed to get vitamin B12 injections on a monthly basis.  History of asthma, chronic respiratory failure with hypoxia Uses 2 L of oxygen by nasal cannula prior to admission.  Obesity Estimated body mass index is 33.16 kg/m as calculated from the following:   Height as of 04/22/22: 5\' 5"  (1.651 m).  Weight as of 04/28/22: 90.4 kg.  DVT Prophylaxis: Lovenox Code Status: DNR Family Communication: No family at bedside Disposition Plan: From SNF.  Status is: Observation The patient will require care spanning > 2 midnights and should be moved to inpatient because: Acute metabolic encephalopathy      Medications: Scheduled:   (feeding supplement) PROSource Plus  30 mL Oral BID BM   carvedilol  3.125 mg Oral BID   cholecalciferol  1,000 Units Oral Daily   diphenhydrAMINE  25 mg Intravenous Once   enoxaparin (LOVENOX) injection  40 mg Subcutaneous Q24H   escitalopram  5 mg Oral Daily   feeding supplement (GLUCERNA 1.2 CAL)  237 mL Oral QPM   folic acid  1 mg Oral Daily   furosemide  40 mg Oral Daily   insulin aspart  0-15 Units Subcutaneous TID WC   insulin aspart  0-5 Units Subcutaneous QHS   insulin glargine-yfgn  6 Units Subcutaneous QHS   montelukast  10 mg Oral Q1200   potassium chloride SA  20 mEq Oral Daily   predniSONE  5 mg Oral Q breakfast   simvastatin  40 mg Oral QHS   spironolactone  25 mg Oral Daily   Continuous:  sodium chloride     cefTRIAXone (ROCEPHIN)  IV     LKT:GYBWLSLHTDSKA, albuterol, hydrALAZINE, hydrOXYzine, ondansetron **OR** ondansetron (ZOFRAN) IV, polyvinyl alcohol, traMADol  Antibiotics: Anti-infectives (From admission, onward)    Start     Dose/Rate Route Frequency Ordered Stop   05/27/22 1400  cefTRIAXone (ROCEPHIN) 1 g in sodium chloride 0.9 % 100 mL IVPB        1 g 200 mL/hr over 30 Minutes Intravenous Every 24 hours 05/26/22 1541     05/26/22 1415  cefTRIAXone (ROCEPHIN) 1 g in sodium chloride 0.9 % 100 mL IVPB        1 g 200 mL/hr over 30 Minutes Intravenous  Once 05/26/22 1409 05/26/22 1523       Objective:  Vital Signs  Vitals:   05/27/22 0200 05/27/22 0400 05/27/22 0600 05/27/22 0733  BP:  (!) 137/90  115/84  Pulse:  (!) 105  96  Resp: 18 18 16 18   Temp:  100 F (37.8 C)  98.7 F (37.1 C)  TempSrc:  Axillary  Axillary  SpO2:  100%  100%    Intake/Output Summary (Last 24 hours) at 05/27/2022 0912 Last data filed at 05/27/2022 0400 Gross per 24 hour  Intake 290 ml  Output 700 ml  Net -410 ml   There were no vitals filed for this visit.  General appearance: Awake alert.  In no distress.  Distracted. Resp: Coarse breath sounds bilaterally.  No  definite crackles or wheezing. Cardio: S1-S2 is normal regular.  No S3-S4.  No rubs murmurs or bruit GI: Abdomen is soft.  Nontender nondistended.  Bowel sounds are present normal.  No masses organomegaly Extremities: No edema.  Physical deconditioning is noted. Neurologic: Disoriented.  No obvious focal neurological deficits.  No facial asymmetry.   Lab Results:  Data Reviewed: I have personally reviewed following labs and reports of the imaging studies  CBC: Recent Labs  Lab 05/26/22 0940 05/26/22 0957 05/26/22 1804 05/27/22 0108  WBC 14.1*  --  15.4* 16.1*  NEUTROABS 11.9*  --   --   --   HGB 11.6* 12.2 12.2 12.2  HCT 37.5 36.0 39.9 38.6  MCV 100.0  --  101.8* 98.5  PLT 148*  --  114* 118*  Basic Metabolic Panel: Recent Labs  Lab 05/26/22 0940 05/26/22 0957 05/26/22 1804 05/27/22 0108  NA 141 140  --  151*  K 3.9 3.8  --  4.5  CL 87*  --   --  96*  CO2 42*  --   --  30  GLUCOSE 336*  --   --  198*  BUN 17  --   --  19  CREATININE 1.16*  --  1.08* 1.17*  CALCIUM 9.0  --   --  9.0    GFR: CrCl cannot be calculated (Unknown ideal weight.).  Liver Function Tests: Recent Labs  Lab 05/26/22 0940  AST 22  ALT 16  ALKPHOS 93  BILITOT 1.2  PROT 5.3*  ALBUMIN 2.4*    Recent Labs  Lab 05/26/22 0940  LIPASE 41    HbA1C: Recent Labs    05/26/22 1804  HGBA1C 8.3*    CBG: Recent Labs  Lab 05/26/22 0922 05/26/22 1729 05/26/22 2107 05/27/22 0636  GLUCAP 312* 302* 231* 199*     Recent Results (from the past 240 hour(s))  Resp panel by RT-PCR (RSV, Flu A&B, Covid) Anterior Nasal Swab     Status: None   Collection Time: 05/26/22  9:17 AM   Specimen: Anterior Nasal Swab  Result Value Ref Range Status   SARS Coronavirus 2 by RT PCR NEGATIVE NEGATIVE Final   Influenza A by PCR NEGATIVE NEGATIVE Final   Influenza B by PCR NEGATIVE NEGATIVE Final    Comment: (NOTE) The Xpert Xpress SARS-CoV-2/FLU/RSV plus assay is intended as an aid in the  diagnosis of influenza from Nasopharyngeal swab specimens and should not be used as a sole basis for treatment. Nasal washings and aspirates are unacceptable for Xpert Xpress SARS-CoV-2/FLU/RSV testing.  Fact Sheet for Patients: EntrepreneurPulse.com.au  Fact Sheet for Healthcare Providers: IncredibleEmployment.be  This test is not yet approved or cleared by the [place] FDA and has been authorized for detection and/or diagnosis of SARS-CoV-2 by FDA under an Emergency Use Authorization (EUA). This EUA will remain in effect (meaning this test can be used) for the duration of the COVID-19 declaration under Section 564(b)(1) of the Act, 21 U.S.C. section 360bbb-3(b)(1), unless the authorization is terminated or revoked.     Resp Syncytial Virus by PCR NEGATIVE NEGATIVE Final    Comment: (NOTE) Fact Sheet for Patients: EntrepreneurPulse.com.au  Fact Sheet for Healthcare Providers: IncredibleEmployment.be  This test is not yet approved or cleared by the [place] FDA and has been authorized for detection and/or diagnosis of SARS-CoV-2 by FDA under an Emergency Use Authorization (EUA). This EUA will remain in effect (meaning this test can be used) for the duration of the COVID-19 declaration under Section 564(b)(1) of the Act, 21 U.S.C. section 360bbb-3(b)(1), unless the authorization is terminated or revoked.  Performed at [facility], [place] [address], [place], [place] [address]   Blood culture (routine x 2)     Status: None (Preliminary result)   Collection Time: 05/26/22  2:06 PM   Specimen: BLOOD RIGHT HAND  Result Value Ref Range Status   Specimen Description BLOOD RIGHT HAND  Final   Special Requests   Final    BOTTLES DRAWN AEROBIC AND ANAEROBIC Blood Culture adequate volume   Culture   Final    NO GROWTH < 24 HOURS Performed at [facility], [place] [address] [place], [place]  [address]    Report Status PENDING  Incomplete  Blood culture (routine x 2)  Status: None (Preliminary result)   Collection Time: 05/26/22  6:05 PM   Specimen: BLOOD  Result Value Ref Range Status   Specimen Description BLOOD BLOOD RIGHT HAND  Final   Special Requests   Final    BOTTLES DRAWN AEROBIC AND ANAEROBIC Blood Culture adequate volume   Culture   Final    NO GROWTH < 12 HOURS Performed at [facility], [address] [place], [place] [address]    Report Status PENDING  Incomplete      Radiology Studies: CT HEAD WO CONTRAST (5MM)  Result Date: 05/26/2022 CLINICAL DATA:  Head trauma. Mental status change urinary tract infection. EXAM: CT HEAD WITHOUT CONTRAST TECHNIQUE: Contiguous axial images were obtained from the base of the skull through the vertex without intravenous contrast. RADIATION DOSE REDUCTION: This exam was performed according to the departmental dose-optimization program which includes automated exposure control, adjustment of the mA and/or kV according to patient size and/or use of iterative reconstruction technique. COMPARISON:  CT head dated April 22, 2022 FINDINGS: Brain: No evidence of acute infarction, hemorrhage, hydrocephalus, extra-axial collection or mass lesion/mass effect. Mild generalized cerebral atrophy and chronic microvascular ischemic changes of the white matter, unchanged. Vascular: No hyperdense vessel or unexpected calcification. Skull: Normal. Negative for fracture or focal lesion. Sinuses/Orbits: No acute finding. Other: None. IMPRESSION: 1. No acute intracranial abnormality. 2. Mild generalized cerebral atrophy and chronic microvascular ischemic changes of the white matter, unchanged. Electronically Signed   By: Keane Police D.O.   On: 05/26/2022 13:28   DG Chest Portable 1 View  Result Date: 05/26/2022 CLINICAL DATA:  Altered mental status. Urinary tract infection. Diabetes. EXAM: PORTABLE CHEST 1 VIEW COMPARISON:  04/23/2022 FINDINGS:  Right-sided Port-A-Cath tip at low right atrium. Numerous leads and wires project over the chest. Right breast and axillary surgical clips. Midline trachea. Mild cardiomegaly. No pleural effusion or pneumothorax. No congestive failure. Low lung volumes. Improved left base aeration with mild airspace disease remaining. IMPRESSION: Cardiomegaly and low lung volumes. Improved left base aeration with minimal airspace disease remaining, favoring atelectasis. Right Port-A-Cath tip at low right atrium, as before. Electronically Signed   By: Abigail Miyamoto M.D.   On: 05/26/2022 09:35       LOS: 0 days   [facility] Pager on [email or web address]  05/27/2022, 9:12 AM

## 2022-05-27 NOTE — TOC Initial Note (Signed)
Transition of Care [facility]) - Initial/Assessment Note    Patient Details  Name: Charlene Houston MRN: [id number] Date of Birth: 10-01-1945  Transition of Care [facility]) CM/SW Contact:    Geralynn Ochs, LCSW Phone Number: 05/27/2022, 2:30 PM  Clinical Narrative:               Patient is from [facility], had been there for short term rehab but transitioned to long term care. [facility] is able to accept back at discharge, pending medical stability and goals of care. CSW to follow.    Expected Discharge Plan: Skilled Nursing Facility Barriers to Discharge: Continued Medical Work up, Ship broker   Patient Goals and CMS Choice Patient states their goals for this hospitalization and ongoing recovery are:: patient unable to participate in goal setting, not oriented CMS Medicare.gov Compare Post Acute Care list provided to:: Patient Represenative (must comment) Choice offered to / list presented to : Adult [place] ownership interest in [facility].provided to:: Adult Children    Expected Discharge Plan and Services     Post Acute Care Choice: [place] Living arrangements for the past 2 months: [place]                                      Prior Living Arrangements/Services Living arrangements for the past 2 months: [place] Lives with:: Facility Resident Patient language and need for interpreter reviewed:: No Do you feel safe going back to the place where you live?: Yes      Need for Family Participation in Patient Care: Yes (Comment) Care giver support system in place?: Yes (comment)   Criminal Activity/Legal Involvement Pertinent to Current Situation/Hospitalization: No - Comment as needed  Activities of Daily Living      Permission Sought/Granted Permission sought to share information with : Facility Sport and exercise psychologist, Family Supports Permission granted to share information  with : Yes, Verbal Permission Granted  Share Information with NAME: Sunnie Nielsen  Permission granted to share info w AGENCY: [facility]  Permission granted to share info w Relationship: Children     Emotional Assessment   Attitude/Demeanor/Rapport: Unable to Assess Affect (typically observed): Unable to Assess   Alcohol / Substance Use: Not Applicable Psych Involvement: No (comment)  Admission diagnosis:  Altered mental status, unspecified altered mental status type [I33.82] Acute metabolic encephalopathy [N05.39] Patient Active Problem List   Diagnosis Date Noted   Acute metabolic encephalopathy 76/73/4193   Pressure injury of skin 04/23/2022   Class 1 obesity 04/23/2022   Acute on chronic systolic CHF (congestive heart failure) ([place]) 04/22/2022   Encephalopathy 04/22/2022   Drug-induced pneumonitis    Acute combined systolic and diastolic congestive heart failure ([place]) 01/03/2022   Type 2 diabetes mellitus ([place]) 01/01/2022   AKI (acute kidney injury) ([place]) 01/01/2022   Dizziness 06/10/2021   Respiratory failure with hypoxia and hypercapnia ([place]) 06/10/2021   Hypokalemia 06/10/2021   Hypomagnesemia 06/10/2021   GERD (gastroesophageal reflux disease) 06/10/2021   Non-small cell lung cancer ([place]) 06/10/2021   COVID-19 virus infection 04/23/2020   Hypoglycemia 04/22/2020   Rhabdomyolysis 04/22/2020   Essential hypertension 04/22/2020   HLD (hyperlipidemia) 04/22/2020   Sleep apnea 04/22/2020   Diabetes mellitus type 2 in nonobese ([place]) 04/22/2020   SBO (small bowel obstruction) ([place]) 07/06/2018   PCP:  Lilian Coma., MD Pharmacy:   [facility] [place],  [place] [address] Phone: [phone] Fax: [phone]     Social Determinants of Health (SDOH) Social History: SDOH Screenings   Food Insecurity: No Food Insecurity (01/04/2022)  Housing: Low Risk  (01/04/2022)  Transportation Needs: No  Transportation Needs (01/04/2022)  Utilities: Not At Risk (01/04/2022)  Alcohol Screen: Low Risk  (01/04/2022)  Financial Resource Strain: Low Risk  (01/04/2022)  Tobacco Use: Medium Risk (05/26/2022)   SDOH Interventions:     Readmission Risk Interventions     No data to display

## 2022-05-27 NOTE — Progress Notes (Signed)
Patient noted not fully awake, easily falling asleep and not follow command at this time. Oral med held for safety. PIV infiltrated. IV team consult d/t difficulty stick.

## 2022-05-27 NOTE — Evaluation (Signed)
Physical Therapy Evaluation Patient Details Name: Charlene Houston MRN: [id number] DOB: July 16, 1945 Today's Date: 05/27/2022  History of Present Illness  Pt is a 77 y.o. female who presented 05/26/22 from SNF with AMS and hyperglycemia concerns. Admitted with acute metabolic encephalopathy and UTI. MRI of head showed no definite evidence of acute intracranial pathology or intracranial metastatic disease. PMH includes lung CA, breast CA, HTN, OSA, DM, arthritis, obesity, CHF, COPD.    Clinical Impression  Pt presents with condition above and deficits mentioned below, see PT Problem List. Recently, pt has been residing at a SNF and has only been able to take a few steps with therapy so far. Prior to discharging to the SNF recently, pt was residing with her daughter and was mod I with rollator, per entry 04/24/22. Currently, pt is demonstrating deficits in overall strength, balance, activity tolerance, and cognition. This eval was limited by pt's varying levels of arousal, cognition, and back pain. Of note, pt appeared to have x2 episodes of seizure-like activity during this session, notified RN. During 1 episode, pt was initially talking with therapist then closed eyes and stopped responding and began to have full body shaking (VSS). She did extend her arm when the NT tried to donn the BP cuff though during this episode, but was unclear if this was more reflexive/tone. Once shaking stopped, was able to transition pt to sit EOB to try to arouse her, which was successful but pt returned self to bed (with maxA for safety) due to back pain. During 2nd episode, pt made eye contact and began to communicate with family upon their entry then had an episode of ~1 min duration in which pt kept an anterior midline gaze with eyes wide open and no response or movement by pt, then suddenly she began to move again. Recommend pt return to SNF for further rehab upon d/c. Will continue to follow acutely.      Recommendations for follow up therapy are one component of a multi-disciplinary discharge planning process, led by the attending physician.  Recommendations may be updated based on patient status, additional functional criteria and insurance authorization.  Follow Up Recommendations Skilled nursing-short term rehab (<3 hours/day) Can patient physically be transported by private vehicle: No    Assistance Recommended at Discharge Frequent or constant Supervision/Assistance  Patient can return home with the following  Two people to help with walking and/or transfers;Two people to help with bathing/dressing/bathroom;Assistance with cooking/housework;Direct supervision/assist for medications management;Direct supervision/assist for financial management;Assist for transportation;Help with stairs or ramp for entrance    Equipment Recommendations Other (comment) (defer to next venue of care)  Recommendations for Other Services       Functional Status Assessment Patient has had a recent decline in their functional status and demonstrates the ability to make significant improvements in function in a reasonable and predictable amount of time.     Precautions / Restrictions Precautions Precautions: Fall;Other (comment) Precaution Comments: watch SpO2; bil mittens Restrictions Weight Bearing Restrictions: No      Mobility  Bed Mobility Overal bed mobility: Needs Assistance Bed Mobility: Rolling, Supine to Sit, Sit to Supine Rolling: Mod assist   Supine to sit: Total assist, HOB elevated Sit to supine: Max assist, HOB elevated   General bed mobility comments: ModA to roll back to supine from R sidelying. TA to transition pt to sit R EOB from supine as pt was lethargic in supine. Pt woke up once seated EOB but reported extreme back pain and tried to return  herself to supine but with poor awareness of her safety and location in bed, needing maxA to safely direct head towards pillow and legs  back onto bed.    Transfers                   General transfer comment: deferred    Ambulation/Gait               General Gait Details: deferred  Stairs            Wheelchair Mobility    Modified Rankin (Stroke Patients Only) Modified Rankin (Stroke Patients Only) Pre-Morbid Rankin Score: Severe disability Modified Rankin: Severe disability     Balance Overall balance assessment: Needs assistance Sitting-balance support: Single extremity supported, Bilateral upper extremity supported, Feet supported Sitting balance-Leahy Scale: Poor Sitting balance - Comments: Pt with retropulsion due to sudden back pain once sitting up EOB, needing maxA to safely direct back to supine. Postural control: Posterior lean     Standing balance comment: deferred                             Pertinent Vitals/Pain Pain Assessment Pain Assessment: Faces Faces Pain Scale: Hurts whole lot Pain Location: back Pain Descriptors / Indicators: Discomfort, Grimacing, Guarding, Moaning Pain Intervention(s): Limited activity within patient's tolerance, Monitored during session, Repositioned    Home Living Family/patient expects to be discharged to:: Skilled nursing facility                        Prior Function Prior Level of Function : Needs assist             Mobility Comments: Recently, pt has been at a SNF and has only taken ~4 steps with assistance per family; At entry 04/24/22- pt was mod I with rollator, but had not done stairs ADLs Comments: Since recently going to a SNF, she has likely needed assistance with all ADLs; At entry 04/24/22-pt needed assistance for LB ADL     Hand Dominance        Extremity/Trunk Assessment   Upper Extremity Assessment Upper Extremity Assessment: Defer to OT evaluation    Lower Extremity Assessment Lower Extremity Assessment: Generalized weakness;LLE deficits/detail;Difficult to assess due to impaired  cognition LLE Deficits / Details: noted moments of extensor tone - unsure if pt was just actively resisting or not    Cervical / Trunk Assessment Cervical / Trunk Assessment: Normal  Communication   Communication:  (pt not verbally responding or following commands at times)  Cognition Arousal/Alertness: Lethargic Behavior During Therapy: Impulsive, Anxious, Restless Overall Cognitive Status: Difficult to assess                                 General Comments: Pt initially smiling and agreeable to session then began to state "I can't", but then when asked to explain further she kept her eyes closed and stopped responding to therapist. Noted tremors in hands that then began to involve entire body and pt not responding to sternal rubs or calling her name but did extend her L arm, appearing to push NT away when NT tried to apply BP cuff, but noted L UE flexor tone and L LE extensor tone. Once shaking stopped, attempted to arouse pt to gather further info and to see if pt was arousable/medically alright by transitioning her to sit EOB,  which did cause pt to wake up and open her eyes with sudden anxiety, reportedly due to back pain. Thus, returned pt to bed per pt request. Pt reporting she had not heard therapist earlier. Pt mumbling but then family entered. Pt made eye contact and began to communicate with family then had an episode of ~1 min duration in which pt kept an anterior midline gaze with eyes wide open and no response or movement by pt, then suddenly she began to move again. Appeared seizure like-notified RN.        General Comments General comments (skin integrity, edema, etc.): VSS during first shaking episode-see NT's entry    Exercises     Assessment/Plan    PT Assessment Patient needs continued PT services  PT Problem List Decreased strength;Decreased range of motion;Decreased activity tolerance;Decreased balance;Decreased mobility;Decreased coordination;Decreased  cognition;Decreased safety awareness;Impaired tone       PT Treatment Interventions DME instruction;Gait training;Functional mobility training;Therapeutic activities;Therapeutic exercise;Neuromuscular re-education;Balance training;Cognitive remediation;Patient/family education    PT Goals (Current goals can be found in the Care Plan section)  Acute Rehab PT Goals Patient Stated Goal: did not state PT Goal Formulation: With patient/family Time For Goal Achievement: 06/10/22 Potential to Achieve Goals: Fair    Frequency Min 2X/week     Co-evaluation               AM-PAC PT "6 Clicks" Mobility  Outcome Measure Help needed turning from your back to your side while in a flat bed without using bedrails?: A Lot Help needed moving from lying on your back to sitting on the side of a flat bed without using bedrails?: Total Help needed moving to and from a bed to a chair (including a wheelchair)?: Total Help needed standing up from a chair using your arms (e.g., wheelchair or bedside chair)?: Total Help needed to walk in hospital room?: Total Help needed climbing 3-5 steps with a railing? : Total 6 Click Score: 7    End of Session Equipment Utilized During Treatment: Oxygen Activity Tolerance: Other (comment);Treatment limited secondary to medical complications (Comment) (limited by cognition) Patient left: in bed;with call bell/phone within reach;with bed alarm set;with family/visitor present;with restraints reapplied Nurse Communication: Mobility status;Other (comment) (seizure like activity) PT Visit Diagnosis: Unsteadiness on feet (R26.81);Other abnormalities of gait and mobility (R26.89);Muscle weakness (generalized) (M62.81);Difficulty in walking, not elsewhere classified (R26.2);Pain Pain - part of body:  (back)    Time: 1140-1159 PT Time Calculation (min) (ACUTE ONLY): 19 min   Charges:   PT Evaluation $PT Eval Moderate Complexity: 1 Mod          Moishe Spice, PT,  DPT Acute Rehabilitation Services  Office: [phone]   Orvan Falconer 05/27/2022, 12:28 PM

## 2022-05-27 NOTE — Progress Notes (Signed)
LTM EEG hooked up and running - no initial skin breakdown - push button tested - [facility] monitoring.  

## 2022-05-27 NOTE — Evaluation (Signed)
Clinical/Bedside Swallow Evaluation Patient Details  Name: Charlene Houston MRN: [id number] Date of Birth: 08-Jul-1945  Today's Date: 05/27/2022 Time: SLP Start Time (ACUTE ONLY): 74 SLP Stop Time (ACUTE ONLY): 1135 SLP Time Calculation (min) (ACUTE ONLY): 18 min  Past Medical History:  Past Medical History:  Diagnosis Date   Arthritis    Asthma    Breast cancer ([place])    Diabetes mellitus without complication ([place])    Hypertension    SBO (small bowel obstruction) ([place]) 06/2018   Past Surgical History:  Past Surgical History:  Procedure Laterality Date   ABDOMINAL HYSTERECTOMY     THUMB AMPUTATION Left    PARTIAL   TONSILLECTOMY     HPI:  Pt is a 77 y.o. female who presented secondary to AMS and hyperglycemia concerns. MRI brain negative for acute changes. CXR 2/14: Improved left base aeration with minimal airspace disease remaining, favoring atelectasis. Pt found to have UTI. 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    Assessment / Plan / Recommendation  Clinical Impression  Pt was seen for bedside swallow evaluation. She was alert, but exhibited difficulty following commands and her limited verbal output often lacked meaning. Oral mechanism exam was limited due to pt's difficulty following commands; however, oral inspection revealed multiple white patches on the mucosal membranes of the posterior aspects of both cheeks, the floor of the mouth, inferior lingual surface, and anterior sulcus. Pt was edentulous with the exception of seven anterior mandibular teeth. Pt exhibited significant impulsivity throughout the evaluation which likely impacted her performance. Pt's suction on the straw was often so significant that one-third to half of the straw often  ended up in her oral cavity, and she demonstrated consecutive swallows of 3-8 oz thereafter. Once the straw was removed, pt vigorously grabbed towards the cup in an attempt to continue intake. She presented with symptoms of pharyngeal dysphagia characterized by signs of aspiration with thin liquids, and absent mastication. Eructation was noted with all liquids; SLP questions the impact of intake rate on this. A puree diet with nectar thick liquids is recommended with observance of swallowing precautions. SLP will follow to evaluate tolerance and for possible advancement vs instrumental assessment as mentation improves. SLP Visit Diagnosis: Dysphagia, unspecified (R13.10)    Aspiration Risk  Mild aspiration risk;Moderate aspiration risk    Diet Recommendation Dysphagia 1 (Puree);Thin liquid   Liquid Administration via: Cup;Straw Medication Administration: Crushed with puree (or whole with puree) Supervision: Staff to assist with self feeding;Full supervision/cueing for compensatory strategies Compensations: Slow rate;Small sips/bites Postural Changes: Seated upright at 90 degrees    Other  Recommendations Oral Care Recommendations: Oral care BID    Recommendations for follow up therapy are one component of a multi-disciplinary discharge planning process, led by the attending physician.  Recommendations may be updated based on patient status, additional functional criteria and insurance authorization.  Follow up Recommendations  (TBD)      Assistance Recommended at Discharge    Functional Status Assessment Patient has had a recent decline in their functional status and demonstrates the ability to make significant improvements in function in a reasonable and predictable amount of time.  Frequency and Duration min 2x/week  2 weeks       Prognosis Prognosis for improved oropharyngeal function: Good  Swallow Study   General Date of Onset: 05/26/22 HPI: Pt is a 77 y.o. female who  presented secondary to AMS and hyperglycemia concerns. MRI brain negative for acute changes. CXR 2/14: Improved left base aeration with minimal airspace disease remaining, favoring atelectasis. Pt found to have UTI. 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 Type of Study: Bedside Swallow Evaluation Previous Swallow Assessment: none Diet Prior to this Study: NPO Temperature Spikes Noted: No Respiratory Status: Nasal cannula History of Recent Intubation: No Behavior/Cognition: Alert;Cooperative Oral Cavity Assessment: Dry Oral Care Completed by SLP: No Oral Cavity - Dentition: Missing dentition (7 anterior mandibular teeth only) Vision: Functional for self-feeding Self-Feeding Abilities: Able to feed self Patient Positioning: Upright in bed;Postural control adequate for testing Baseline Vocal Quality:  (limited output) Volitional Cough: Cognitively unable to elicit Volitional Swallow: Unable to elicit    Oral/Motor/Sensory Function Overall Oral Motor/Sensory Function:  (UTA)   Ice Chips Ice chips: Within functional limits Presentation: Spoon   Thin Liquid Thin Liquid: Impaired Presentation: Cup;Straw Pharyngeal  Phase Impairments: Throat Clearing - Immediate;Cough - Immediate;Cough - Delayed    Nectar Thick Nectar Thick Liquid: Within functional limits Presentation: Straw   Honey Thick Honey Thick Liquid: Not tested   Puree Puree: Within functional limits Presentation: Spoon   Solid     Solid: Impaired Oral Phase Impairments: Poor awareness of bolus Oral Phase Functional Implications: Impaired mastication (absent mastication)     Sergey Ishler I. Hardin Negus, [place], [place] Office number [phone]  Horton Marshall 05/27/2022,12:45  PM

## 2022-05-28 DIAGNOSIS — I5022 Chronic systolic (congestive) heart failure: Secondary | ICD-10-CM | POA: Diagnosis not present

## 2022-05-28 DIAGNOSIS — E870 Hyperosmolality and hypernatremia: Secondary | ICD-10-CM | POA: Diagnosis not present

## 2022-05-28 DIAGNOSIS — N390 Urinary tract infection, site not specified: Secondary | ICD-10-CM | POA: Diagnosis not present

## 2022-05-28 DIAGNOSIS — G9341 Metabolic encephalopathy: Secondary | ICD-10-CM | POA: Diagnosis not present

## 2022-05-28 DIAGNOSIS — R4182 Altered mental status, unspecified: Secondary | ICD-10-CM | POA: Diagnosis not present

## 2022-05-28 LAB — BASIC METABOLIC PANEL
Anion gap: 12 (ref 5–15)
BUN: 16 mg/dL (ref 8–23)
CO2: 38 mmol/L — ABNORMAL HIGH (ref 22–32)
Calcium: 8.6 mg/dL — ABNORMAL LOW (ref 8.9–10.3)
Chloride: 92 mmol/L — ABNORMAL LOW (ref 98–111)
Creatinine, Ser: 0.89 mg/dL (ref 0.44–1.00)
GFR, Estimated: >60 mL/min (ref >60)
Glucose, Bld: 195 mg/dL — ABNORMAL HIGH (ref 70–99)
Potassium: 3.6 mmol/L (ref 3.5–5.1)
Sodium: 142 mmol/L (ref 135–145)

## 2022-05-28 LAB — GLUCOSE, CAPILLARY
Glucose-Capillary: 190 mg/dL — ABNORMAL HIGH (ref 70–99)
Glucose-Capillary: 395 mg/dL — ABNORMAL HIGH (ref 70–99)
Glucose-Capillary: 508 mg/dL (ref 70–99)
Glucose-Capillary: 391 mg/dL — ABNORMAL HIGH (ref 70–99)
Glucose-Capillary: 165 mg/dL — ABNORMAL HIGH (ref 70–99)

## 2022-05-28 LAB — VITAMIN B12: Vitamin B-12: 836 pg/mL (ref 180–914)

## 2022-05-28 LAB — CBC
HCT: 34.8 % — ABNORMAL LOW (ref 36.0–46.0)
Hemoglobin: 10.6 g/dL — ABNORMAL LOW (ref 12.0–15.0)
MCH: 31 pg (ref 26.0–34.0)
MCHC: 30.5 g/dL (ref 30.0–36.0)
MCV: 101.8 fL — ABNORMAL HIGH (ref 80.0–100.0)
Platelets: 111 10*3/uL — ABNORMAL LOW (ref 150–400)
RBC: 3.42 MIL/uL — ABNORMAL LOW (ref 3.87–5.11)
RDW: 14.3 % (ref 11.5–15.5)
WBC: 13.9 10*3/uL — ABNORMAL HIGH (ref 4.0–10.5)
nRBC: 0.8 % — ABNORMAL HIGH (ref 0.0–0.2)

## 2022-05-28 LAB — CORTISOL-AM, BLOOD: Cortisol - AM: 78.1 ug/dL — ABNORMAL HIGH (ref 6.7–22.6)

## 2022-05-28 LAB — TSH: TSH: 0.42 u[IU]/mL (ref 0.350–4.500)

## 2022-05-28 LAB — T4, FREE: Free T4: 0.92 ng/dL (ref 0.61–1.12)

## 2022-05-28 LAB — FOLATE: Folate: 18.4 ng/mL (ref >5.9)

## 2022-05-28 MED ORDER — INSULIN ASPART 100 UNIT/ML IJ SOLN
20.0000 [IU] | Freq: Once | INTRAMUSCULAR | Status: AC
  Administered 2022-05-28: 20 [IU] via SUBCUTANEOUS

## 2022-05-28 MED ORDER — INSULIN GLARGINE-YFGN 100 UNIT/ML ~~LOC~~ SOLN
8.0000 [IU] | Freq: Every day | SUBCUTANEOUS | Status: DC
  Filled 2022-05-28: qty 0.08

## 2022-05-28 MED ORDER — INSULIN GLARGINE-YFGN 100 UNIT/ML ~~LOC~~ SOLN
12.0000 [IU] | Freq: Every day | SUBCUTANEOUS | Status: DC
  Administered 2022-05-28 – 2022-05-30 (×3): 12 [IU] via SUBCUTANEOUS
  Filled 2022-05-28 (×4): qty 0.12

## 2022-05-28 NOTE — Progress Notes (Addendum)
[facility] PROGRESS NOTE   Charlene Houston POE:[id number] DOB: 02/17/1946 DOA: 05/26/2022  PCP: Lilian Coma., MD  Brief History/Interval Summary: 77 y.o. female with history h/o HTN, DM, arthritis, stage IIIA right breast cancer in 2007 s/p lumpectomy, asthma,?  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  According to the admitting providers conversation with daughter patient has been declining even her mental status over the last 6 to 8 months.  Patient was found to have abnormal UA.  She was hospitalized for further management.    Consultants: Neurology.  Palliative medicine  Procedures: Continuous EEG    Subjective/Interval History: Patient seems to be little bit more responsive this morning compared to yesterday but still quite distracted.  She did tell me that she was in [place] but did not answer any of the other questions.    Assessment/Plan:  Acute metabolic encephalopathy Reason for her mental status change is unclear but probably due to UTI.  However daughter also mentioned that she has had a decline over the past few months. CT head did not show any acute findings.  MRI brain was motion degraded but did not show any acute findings.  No masses or lesions were identified.  Patient does not have any focal neurological deficits.  Continue to treat UTI There was also concern for seizure activity.  EEG was done which did not show any epileptiform or seizure activity.  Neurology was subsequently consulted.  Patient  currently on long-term EEG.   She is noted to have B12 deficiency based on level of 124 back in 2022.  Supposed to be on B12 injections every month.  B12 level is 836.  Folic acid level is 31.5.  TSH is normal at 0.42.   Speech therapy following.  Urinary tract infection Follow-up urine cultures.  Continue with ceftriaxone.  Chronic systolic CHF EF is known to be about 35%.  Followed by heart failure clinic.  For Kathe Becton was held due to hyponatremia and concern for significant dehydration and hypovolemia.  Spironolactone also on hold.  Home medication is also shows that she is supposed to be on carvedilol and Jardiance. Volume status seems to be stable this morning.  Hold off on further IV hydration.  Sodium level has improved.  Dehydration/hypernatremia Likely due to poor oral intake.  Her diuretics were discontinued including furosemide and spironolactone.  She was given IV fluids for 24 hours.  Sodium level has improved.  Continue to monitor for now.    Diabetes mellitus type 2, uncontrolled with hyperglycemia Likely due to steroid use.  HbA1c found to be 8.3. She is noted to be on Jardiance prior to admission.  Also on glimepiride Monitor CBGs.  Continue SSI.  Also noted to be on glargine. Could adjust the dose of glargine a bit for better glycemic control.  History of stage IV lung cancer Followed by oncology in [facility].  According to oncology notes patient had reassuring PET scan in December 2023.  She has been off of Keytruda since last summer due to complications of pneumonitis.  Has been on prednisone for same and has been tapered down gradually.  Reduced  to 5 mg.  Previous history of breast cancer She is status postlumpectomy and radiation treatment in 2007.  Leukocytosis Likely due to chronic prednisone use.  Could also be due to UTI.  Follow-up on blood cultures.  She does have a Port-A-Cath.  Vitamin B12 deficiency Will recheck levels.  Apparently supposed to get vitamin  B12 injections on a monthly basis. Repeat B12 level is pending.  Macrocytic anemia Drop in hemoglobin could be dilutional.  No evidence of overt bleeding.  Recheck labs tomorrow.  History of asthma, chronic respiratory failure with hypoxia Uses 2 L of oxygen by nasal cannula prior to admission.  Obesity Estimated body mass index is 33.16 kg/m as calculated from the following:   Height as of 04/22/22: 5\' 5"  (1.651 m).   Weight as of 04/28/22: 90.4 kg.  DVT Prophylaxis: Lovenox Code Status: DNR Family Communication: Discussed with her daughter and today. Disposition Plan: From SNF.   Status is: Inpatient Remains inpatient appropriate because: Acute metabolic encephalopathy      Medications: Scheduled:  (feeding supplement) PROSource Plus  30 mL Oral BID BM   carvedilol  3.125 mg Oral BID   cholecalciferol  1,000 Units Oral Daily   diphenhydrAMINE  25 mg Intravenous Once   enoxaparin (LOVENOX) injection  40 mg Subcutaneous Q24H   escitalopram  5 mg Oral Daily   feeding supplement (GLUCERNA 1.2 CAL)  237 mL Oral QPM   fluconazole  100 mg Oral Daily   folic acid  1 mg Oral Daily   insulin aspart  0-15 Units Subcutaneous TID WC   insulin aspart  0-5 Units Subcutaneous QHS   insulin glargine-yfgn  6 Units Subcutaneous QHS   montelukast  10 mg Oral Q1200   potassium chloride SA  20 mEq Oral Daily   predniSONE  5 mg Oral Q breakfast   simvastatin  40 mg Oral QHS   Continuous:  cefTRIAXone (ROCEPHIN)  IV 1 g (05/27/22 1301)   VOZ:[id number], albuterol, hydrALAZINE, hydrOXYzine, ondansetron **OR** ondansetron (ZOFRAN) IV, polyvinyl alcohol, traMADol  Antibiotics: Anti-infectives (From admission, onward)    Start     Dose/Rate Route Frequency Ordered Stop   05/27/22 1400  cefTRIAXone (ROCEPHIN) 1 g in sodium chloride 0.9 % 100 mL IVPB        1 g 200 mL/hr over 30 Minutes Intravenous Every 24 hours 05/26/22 1541     05/27/22 1230  fluconazole (DIFLUCAN) tablet 100 mg         100 mg Oral Daily 05/27/22 1137 06/01/22 0959   05/26/22 1415  cefTRIAXone (ROCEPHIN) 1 g in sodium chloride 0.9 % 100 mL IVPB        1 g 200 mL/hr over 30 Minutes Intravenous  Once 05/26/22 1409 05/26/22 1523       Objective:  Vital Signs  Vitals:   05/27/22 2000 05/27/22 2311 05/28/22 0334 05/28/22 0759  BP: 105/78 (!) 129/91 120/70 124/73  Pulse: 99 97 89 93  Resp: 20 20 18 20   Temp: 99.3 F (37.4 C) 98.9 F (37.2 C) 98.4 F (36.9 C) 98.7 F (37.1 C)  TempSrc: Oral Oral Oral Oral  SpO2: 94% 97% 96% 100%    Intake/Output Summary (Last 24 hours) at 05/28/2022 1004 Last data filed at 05/28/2022 0900 Gross per 24 hour  Intake 1187.89 ml  Output 450 ml  Net 737.89 ml    There were no vitals filed for this visit.  General appearance: Patient is awake but not very responsive but did answer a  couple questions. Resp: Clear to auscultation bilaterally.  Normal effort Cardio: S1-S2 is normal regular.  No S3-S4.  No rubs murmurs or bruit GI: Abdomen is soft.  Nontender nondistended.  Bowel sounds are present normal.  No masses organomegaly Extremities: no edema.  Physical deconditioning noted. Neurologic: Disoriented for the most part.  Did tell me that she was in [place].  Slightly better responsiveness compared to yesterday.   Lab Results:  Data Reviewed: I have personally reviewed following labs and reports of the imaging studies  CBC: Recent Labs  Lab 05/26/22 0940 05/26/22 0957 05/26/22 1804 05/27/22 0108 05/28/22 0649  WBC 14.1*  --  15.4* 16.1* 13.9*  NEUTROABS 11.9*  --   --   --   --   HGB 11.6* 12.2 12.2 12.2 10.6*  HCT 37.5 36.0 39.9 38.6 34.8*  MCV 100.0  --  101.8* 98.5 101.8*  PLT 148*  --  114* 118* 111*     Basic Metabolic Panel: Recent Labs  Lab 05/26/22 0940 05/26/22 0957 05/26/22 1804 05/27/22 0108 05/28/22 0649  NA 141 140  --  151* 142  K 3.9 3.8  --  4.5 3.6  CL 87*  --   --  96* 92*  CO2 42*  --   --  30 38*  GLUCOSE 336*   --   --  198* 195*  BUN 17  --   --  19 16  CREATININE 1.16*  --  1.08* 1.17* 0.89  CALCIUM 9.0  --   --  9.0 8.6*     GFR: CrCl cannot be calculated (Unknown ideal weight.).  Liver Function Tests: Recent Labs  Lab 05/26/22 0940  AST 22  ALT 16  ALKPHOS 93  BILITOT 1.2  PROT 5.3*  ALBUMIN 2.4*     Recent Labs  Lab 05/26/22 0940  LIPASE 41     HbA1C: Recent Labs    05/26/22 1804  HGBA1C 8.3*     CBG: Recent Labs  Lab 05/27/22 0636 05/27/22 1143 05/27/22 1715 05/27/22 2138 05/28/22 0610  GLUCAP 199* 232* 217* 375* 190*      Recent Results (from the past 240 hour(s))  Resp panel by RT-PCR (RSV, Flu A&B, Covid) Anterior Nasal Swab     Status: None   Collection Time: 05/26/22  9:17 AM   Specimen: Anterior Nasal Swab  Result Value Ref Range Status   SARS Coronavirus 2 by RT PCR NEGATIVE NEGATIVE Final   Influenza A by PCR NEGATIVE NEGATIVE Final   Influenza B by PCR NEGATIVE NEGATIVE Final    Comment: (NOTE) The Xpert Xpress SARS-CoV-2/FLU/RSV plus assay is intended as an aid in the diagnosis of influenza from Nasopharyngeal swab specimens and should not be used as a sole basis for treatment. Nasal washings and aspirates are unacceptable for Xpert Xpress SARS-CoV-2/FLU/RSV testing.  Fact Sheet for Patients: EntrepreneurPulse.com.au  Fact Sheet for Healthcare Providers: IncredibleEmployment.be  This test is not yet approved or cleared by the [place] FDA and has been authorized for detection and/or diagnosis of SARS-CoV-2 by FDA under an Emergency Use Authorization (EUA). This EUA will remain in effect (meaning this test can be used) for the duration of the COVID-19 declaration under Section 564(b)(1) of the Act, 21 U.S.C. section 360bbb-3(b)(1), unless the authorization is terminated or revoked.     Resp Syncytial Virus by PCR NEGATIVE NEGATIVE Final    Comment: (NOTE) Fact Sheet for  Patients: EntrepreneurPulse.com.au  Fact Sheet for Healthcare Providers: IncredibleEmployment.be  This test is  not yet approved or cleared by the [place] and has been authorized for detection and/or diagnosis of SARS-CoV-2 by FDA under an Emergency Use Authorization (EUA). This EUA will remain in effect (meaning this test can be used) for the duration of the COVID-19 declaration under Section 564(b)(1) of the Act, 21 U.S.C. section 360bbb-3(b)(1), unless the authorization is terminated or revoked.  Performed at [facility], [place] [address] [place], [place] [address]   Blood culture (routine x 2)     Status: None (Preliminary result)   Collection Time: 05/26/22  2:06 PM   Specimen: BLOOD RIGHT HAND  Result Value Ref Range Status   Specimen Description BLOOD RIGHT HAND  Final   Special Requests   Final    BOTTLES DRAWN AEROBIC AND ANAEROBIC Blood Culture adequate volume   Culture   Final    NO GROWTH 2 DAYS Performed at [facility], [place] [address], [place], [place] [address]    Report Status PENDING  Incomplete  Blood culture (routine x 2)     Status: None (Preliminary result)   Collection Time: 05/26/22  6:05 PM   Specimen: BLOOD  Result Value Ref Range Status   Specimen Description BLOOD BLOOD RIGHT HAND  Final   Special Requests   Final    BOTTLES DRAWN AEROBIC AND ANAEROBIC Blood Culture adequate volume   Culture   Final    NO GROWTH 2 DAYS Performed at [facility], [place] [address], [place], [place] [address]    Report Status PENDING  Incomplete      Radiology Studies: Overnight EEG with video  Result Date: 05/28/2022 Lora Havens, MD     05/28/2022  8:10 AM Patient Name: Charlene Houston MRN: [id number] Epilepsy Attending: Lora Havens Referring Physician/Provider: Greta Doom, MD Duration: 05/27/2022 2042 to to 06/07/2022 0800 Patient history: 77yo F with ams. EEG to evaluate for  seizure  Level of alertness: Awake  AEDs during EEG study: None  Technical aspects: This EEG study was done with scalp electrodes positioned according to the 10-20 International system of electrode placement. Electrical activity was reviewed with band pass filter of 1-70Hz , sensitivity of 7 uV/mm, display speed of 65mm/sec with a 60Hz  notched filter applied as appropriate. EEG data were recorded continuously and digitally stored.  Video monitoring was available and reviewed as appropriate.  Description: EEG showed continuous generalized 3 to 6 Hz theta-delta slowing.  Hyperventilation and photic stimulation were not performed.   ABNORMALITY - Continuous slow, generalized  IMPRESSION: This study is suggestive of moderate diffuse encephalopathy, nonspecific etiology. No seizures or epileptiform discharges were seen throughout the recording.  Lora Havens   EEG adult  Result Date: 05/27/2022 Lora Havens, MD     05/27/2022  4:18 PM Patient Name: Lauryl Seyer MRN: [id number] Epilepsy Attending: Lora Havens Referring Physician/Provider: Bonnielee Haff, MD Date: 05/27/2022 Duration: 26.10 mins Patient history: 77yo F with ams. EEG to evaluate for seizure Level of alertness: Awake AEDs during EEG study: None Technical aspects: This EEG study was done with scalp electrodes positioned according to the 10-20 International system of electrode placement. Electrical activity was reviewed with band pass filter of 1-70Hz , sensitivity of 7 uV/mm, display speed of 73mm/sec with a 60Hz  notched filter applied as appropriate. EEG data were recorded continuously and digitally stored.  Video monitoring was available and reviewed as appropriate. Description: EEG showed continuous generalized 3 to 6 Hz theta-delta slowing.  Hyperventilation  and photic stimulation were not performed.  Of note, study was technically difficult due to significant electrode and movement artifact.  ABNORMALITY - Continuous slow,  generalized IMPRESSION: This technically difficult study is suggestive of moderate diffuse encephalopathy, nonspecific etiology. No seizures or epileptiform discharges were seen throughout the recording. Lora Havens   MR BRAIN WO CONTRAST  Result Date: 05/27/2022 CLINICAL DATA:  Altered mental status. History of lung cancer and breast cancer. EXAM: MRI HEAD WITHOUT CONTRAST TECHNIQUE: Multiplanar, multiecho pulse sequences of the brain and surrounding structures were obtained without intravenous contrast. COMPARISON:  CT head 05/26/2022, brain MRI 06/09/2021 FINDINGS: Image quality is motion degraded. Additionally, evaluation for small metastatic lesions is limited in the absence of intravenous contrast. Brain: There is no acute intracranial hemorrhage, extra-axial fluid collection, or acute infarct. There is mild parenchymal volume loss with prominence of the ventricular system and extra-axial CSF spaces, within expected limits for age. The ventricles are normal in size. Parenchymal signal is normal for age, with minimal periventricular FLAIR signal abnormality. The pituitary and suprasellar region are normal. No definite mass lesion is seen to suggest intracranial metastatic disease. There is no mass effect or midline shift. Vascular: Normal flow voids. Skull and upper cervical spine: Normal marrow signal. Sinuses/Orbits: The paranasal sinuses are clear. Bilateral lens implants are in place. The globes and orbits are otherwise unremarkable. Other: A left mastoid effusion is again seen. The imaged nasopharynx is unremarkable. IMPRESSION: No definite evidence of acute intracranial pathology or intracranial metastatic disease, within the confines of motion degraded images and noncontrast technique. Electronically Signed   By: Valetta Mole M.D.   On: 05/27/2022 09:52   CT HEAD WO CONTRAST (5MM)  Result Date: 05/26/2022 CLINICAL DATA:  Head trauma. Mental status change urinary tract infection. EXAM: CT  HEAD WITHOUT CONTRAST TECHNIQUE: Contiguous axial images were obtained from the base of the skull through the vertex without intravenous contrast. RADIATION DOSE REDUCTION: This exam was performed according to the departmental dose-optimization program which includes automated exposure control, adjustment of the mA and/or kV according to patient size and/or use of iterative reconstruction technique. COMPARISON:  CT head dated April 22, 2022 FINDINGS: Brain: No evidence of acute infarction, hemorrhage, hydrocephalus, extra-axial collection or mass lesion/mass effect. Mild generalized cerebral atrophy and chronic microvascular ischemic changes of the white matter, unchanged. Vascular: No hyperdense vessel or unexpected calcification. Skull: Normal. Negative for fracture or focal lesion. Sinuses/Orbits: No acute finding. Other: None. IMPRESSION: 1. No acute intracranial abnormality. 2. Mild generalized cerebral atrophy and chronic microvascular ischemic changes of the white matter, unchanged. Electronically Signed   By: Keane Police D.O.   On: 05/26/2022 13:28       LOS: 1 day   [facility] Pager on [email or web address]  05/28/2022, 10:04 AM

## 2022-05-28 NOTE — Inpatient Diabetes Management (Signed)
Inpatient Diabetes Program Recommendations  AACE/ADA: New Consensus Statement on Inpatient Glycemic Control (2015)  Target Ranges:  Prepandial:   less than 140 mg/dL      Peak postprandial:   less than 180 mg/dL (1-2 hours)      Critically ill patients:  140 - 180 mg/dL   Lab Results  Component Value Date   GLUCAP 190 (H) 05/28/2022   HGBA1C 8.3 (H) 05/26/2022    Review of Glycemic Control  Latest Reference Range & Units 05/27/22 06:36 05/27/22 11:43 05/27/22 17:15 05/27/22 21:38 05/28/22 06:10  Glucose-Capillary 70 - 99 mg/dL 199 (H) 232 (H)  Novolog 5 units 217 (H)  Novolog 5 units 375 (H)  Novolog 5 units 190 (H)   Diabetes history: DM 2 Outpatient Diabetes medications: Jardiance 10 mg Daily, Amaryl 2 mg Daily Current orders for Inpatient glycemic control:  Semglee 6 units qhs Novolog 0-15 + hs  Glucerna Qevening PO prednisone 5 mg Daily Variable PO intake  Inpatient Diabetes Program Recommendations:    -  Consider increasing Semglee to 8 units -  Consider adding Novolog 3 units tid meal coverage if eating >50% of meals  Thanks,  Tama Headings RN, MSN, BC-ADM Inpatient Diabetes Coordinator Team Pager [phone] (8a-5p)

## 2022-05-28 NOTE — Procedures (Signed)
Patient Name: Charlene Houston  MRN: [id number]  Epilepsy Attending: Lora Havens  Referring Physician/Provider: Greta Doom, MD  Duration: 05/27/2022 2042 to to 06/07/2022 0800  Patient history: 77yo F with ams. EEG to evaluate for seizure   Level of alertness: Awake   AEDs during EEG study: None   Technical aspects: This EEG study was done with scalp electrodes positioned according to the 10-20 International system of electrode placement. Electrical activity was reviewed with band pass filter of 1-70Hz , sensitivity of 7 uV/mm, display speed of 43mm/sec with a 60Hz  notched filter applied as appropriate. EEG data were recorded continuously and digitally stored.  Video monitoring was available and reviewed as appropriate.   Description: EEG showed continuous generalized 3 to 6 Hz theta-delta slowing.  Hyperventilation and photic stimulation were not performed.     ABNORMALITY - Continuous slow, generalized   IMPRESSION: This study is suggestive of moderate diffuse encephalopathy, nonspecific etiology. No seizures or epileptiform discharges were seen throughout the recording.   Reena Borromeo [name]

## 2022-05-28 NOTE — Progress Notes (Signed)
Speech Language Pathology Treatment: Dysphagia  Patient Details Name: Charlene Houston MRN: [id number] DOB: [birth year] Today's Date: 05/28/2022 Time: 4562-5638 SLP Time Calculation (min) (ACUTE ONLY): 13 min  Assessment / Plan / Recommendation Clinical Impression  Pt was seen for dysphagia treatment. She was alert and mentation was improved compared to yesterday; she followed some commands and responded to questions more appropriately, but intelligibility was reduced due imprecision and reduced vocal intensity. Impulsivity was less significant than during the initial evaluation. She consumed eight consecutive swallows of thin liquids, but independently stopped drinking without prompts/SLP's removal of the straw. Pt was able to masticate dysphagia 3 boluses today without prompts for initiation, but mastication was prolonged and inconsistent with prompts being necessary for her to complete the masticatory process. Moderate residue was noted with boluses which required mastication and residue was removed with an oral swab. Pt tolerated solids and thin liquids via straw without overt s/s of aspiration and passed the Yale swallow screen with liquids twice. Pt's diet will be advanced to dysphagia 1 and thin liquids. SLP will continue to follow pt.     HPI HPI: Pt is a 76 y.o. female who presented secondary to AMS and hyperglycemia concerns. MRI brain negative for acute changes. CXR 2/14: Improved left base aeration with minimal airspace disease remaining, favoring atelectasis. Pt found to have UTI. 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      SLP Plan  Continue with current plan of care      Recommendations for follow up therapy are one component of a multi-disciplinary  discharge planning process, led by the attending physician.  Recommendations may be updated based on patient status, additional functional criteria and insurance authorization.    Recommendations  Diet recommendations: Dysphagia 1 (puree);Thin liquid Liquids provided via: Cup;Straw Medication Administration: Crushed with puree (or whole with puree) Supervision: Staff to assist with self feeding;Full supervision/cueing for compensatory strategies Compensations: Slow rate;Small sips/bites Postural Changes and/or Swallow Maneuvers: Seated upright 90 degrees                Oral Care Recommendations: Oral care BID Follow Up Recommendations:  (TBD) SLP Visit Diagnosis: Dysphagia, unspecified (R13.10) Plan: Continue with current plan of care       Chaitanya Amedee I. Hardin Negus, [place], [place] Office number [phone]     Horton Marshall  05/28/2022, 2:23 PM

## 2022-05-28 NOTE — Evaluation (Signed)
Occupational Therapy Evaluation Patient Details Name: Charlene Houston MRN: [id number] DOB: 1945/10/16 Today's Date: 05/28/2022   History of Present Illness Pt is a 77 y.o. female who presented 05/26/22 from SNF with AMS and hyperglycemia concerns. Admitted with acute metabolic encephalopathy and UTI. MRI of head showed no definite evidence of acute intracranial pathology or intracranial metastatic disease. PMH includes lung CA, breast CA, HTN, OSA, DM, arthritis, obesity, CHF, COPD.   Clinical Impression   This 77 yo female admitted with above presents to acute OT with PLOF of needing A (more so since recently discharged from hospital to SNF). She currently is total A -total A +2 for bed level ADLs and mobility. She will continue to benefit from acute OT with follow up back at SNF.      Recommendations for follow up therapy are one component of a multi-disciplinary discharge planning process, led by the attending physician.  Recommendations may be updated based on patient status, additional functional criteria and insurance authorization.   Follow Up Recommendations  Skilled nursing-short term rehab (<3 hours/day)     Assistance Recommended at Discharge Frequent or constant Supervision/Assistance  Patient can return home with the following Two people to help with walking and/or transfers;Two people to help with bathing/dressing/bathroom;Assistance with cooking/housework;Assistance with feeding;Help with stairs or ramp for entrance;Assist for transportation;Direct supervision/assist for financial management;Direct supervision/assist for medications management    Functional Status Assessment  Patient has had a recent decline in their functional status and demonstrates the ability to make significant improvements in function in a reasonable and predictable amount of time.  Equipment Recommendations  Other (comment) (TBD next venue)       Precautions / Restrictions  Precautions Precautions: Fall;Other (comment) Precaution Comments: watch SpO2 Restrictions Weight Bearing Restrictions: No      Mobility Bed Mobility               General bed mobility comments: Had pt hold my hands and attempt to pull forward (x2) to get her back off the of the back of the bed while she was sittiing at~50 degrees of HOB, pt total A to barely get her back off of bed--she did attempt to pull with Bil UEs just too weak to do it           ADL either performed or assessed with clinical judgement   ADL                                         General ADL Comments: total A bed level     Vision   Additional Comments: Pt with tendency to have head turned to right slightly and keeping eyes to right of midline, can cross midline with head and eyes when cued to do so with increased time and effort but mantains <5 seconds            Pertinent Vitals/Pain Pain Assessment Pain Assessment: No/denies pain     Hand Dominance Right   Extremity/Trunk Assessment Upper Extremity Assessment Upper Extremity Assessment: RUE deficits/detail;LUE deficits/detail RUE Deficits / Details: Decreased AROM and PROM at shoulder, decreased AROM elbow distally but PROM WNL RUE Coordination: decreased fine motor;decreased gross motor LUE Deficits / Details: Decreased AROM and PROM at shoulder, decreased AROM elbow distally but PROM WNL; hand hand with thumb tucked into fingers, but I was easily able to get her to open hand LUE Coordination: decreased  fine motor;decreased gross motor           Communication Communication Communication:  (very soft voice and delayed with answers)   Cognition Arousal/Alertness: Awake/alert Behavior During Therapy: WFL for tasks assessed/performed Overall Cognitive Status: No family/caregiver present to determine baseline cognitive functioning                                 General Comments: Pt awake upon my  entry with NT feeding patient. Pt able to track me with eyes and head turn to left (but note maintain                Home Living Family/patient expects to be discharged to:: Skilled nursing facility                                        Prior Functioning/Environment Prior Level of Function : Needs assist             Mobility Comments: Recently, pt has been at a SNF and has only taken ~4 steps with assistance per family; At entry 04/24/22- pt was mod I with rollator, but had not done stairs ADLs Comments: Since recently going to a SNF, she has likely needed assistance with all ADLs; At entry 04/24/22-pt needed assistance for LB ADL        OT Problem List: Decreased strength;Decreased range of motion;Impaired balance (sitting and/or standing);Impaired vision/perception;Obesity;Decreased cognition      OT Treatment/Interventions: Self-care/ADL training;DME and/or AE instruction;Patient/family education;Balance training    OT Goals(Current goals can be found in the care plan section) Acute Rehab OT Goals Patient Stated Goal: pt unable to state OT Goal Formulation: Patient unable to participate in goal setting Time For Goal Achievement: 06/18/22 Potential to Achieve Goals: Fair  OT Frequency: Min 2X/week       AM-PAC OT "6 Clicks" Daily Activity     Outcome Measure Help from another person eating meals?: Total Help from another person taking care of personal grooming?: Total Help from another person toileting, which includes using toliet, bedpan, or urinal?: Total Help from another person bathing (including washing, rinsing, drying)?: Total Help from another person to put on and taking off regular upper body clothing?: Total Help from another person to put on and taking off regular lower body clothing?: Total 6 Click Score: 6   End of Session    Activity Tolerance: Patient tolerated treatment well Patient left: in bed (NT with PT)  OT Visit Diagnosis:  Other abnormalities of gait and mobility (R26.89);Muscle weakness (generalized) (M62.81);Other symptoms and signs involving cognitive function                Time: 0051-1021 OT Time Calculation (min): 11 min Charges:  OT General Charges $OT Visit: 1 Visit OT Evaluation $OT Eval Low Complexity: [place] [facility] [phone]    Almon Register 05/28/2022, 2:20 PM

## 2022-05-28 NOTE — Progress Notes (Signed)
Subjective: Patient is slightly more awake today  Exam: Vitals:   05/28/22 1150 05/28/22 1517  BP: 119/80 102/60  Pulse: 94 100  Resp: 18 18  Temp: 98 F (36.7 C) 98.1 F (36.7 C)  SpO2: 100% 100%   Gen: In bed, NAD Resp: non-labored breathing, no acute distress Abd: soft, nt  Neuro: MS: She is awake, tells me her name, does follow simple commands. CN: Extraocular movements are intact, visual fields are full Motor: She moves everything symmetrically though she does not cooperate with formal testing Sensory: Intact to light touch  EEG with no seizures  Pertinent Labs: B12 836 Folate 18.4 TSH 0.42 A.M. cortisol 78 UA-positive   Impression: 77 year old female with progressive decline in mental status over the past few months in the setting of stage IV cancer.  She did have a positive response to therapy but has unfortunately had a progressive decline in her functional status.  She has a history of B12 deficiency that was found in November 2022 and is treated with good levels today.  My suspicion is that her acute worsening was likely due to her urinary tract infection, but that does not explain her progressive loss of function over the past 6 to 8 months.  More acutely she has declined over the past 6 to 8 weeks with tremor.  I do think that paraneoplastic antibodies are certainly a possibility given the subacute decline in the setting of weaning prednisone.  Recommendations: 1) continue EEG to hopefully capture an episode of behavioral arrest 2) ammonia pending 3) I will consider sending paraneoplastic antibody testing. 4) observe for response to UTI treatment.  Roland Rack, MD [facility] [phone]  If 7pm- 7am, please page neurology on call as listed in [place].

## 2022-05-29 DIAGNOSIS — N390 Urinary tract infection, site not specified: Secondary | ICD-10-CM | POA: Diagnosis not present

## 2022-05-29 DIAGNOSIS — E870 Hyperosmolality and hypernatremia: Secondary | ICD-10-CM | POA: Diagnosis not present

## 2022-05-29 DIAGNOSIS — Z7189 Other specified counseling: Secondary | ICD-10-CM | POA: Diagnosis not present

## 2022-05-29 DIAGNOSIS — I5022 Chronic systolic (congestive) heart failure: Secondary | ICD-10-CM | POA: Diagnosis not present

## 2022-05-29 DIAGNOSIS — Z515 Encounter for palliative care: Secondary | ICD-10-CM | POA: Diagnosis not present

## 2022-05-29 DIAGNOSIS — G9341 Metabolic encephalopathy: Secondary | ICD-10-CM | POA: Diagnosis not present

## 2022-05-29 DIAGNOSIS — R4182 Altered mental status, unspecified: Secondary | ICD-10-CM | POA: Diagnosis not present

## 2022-05-29 LAB — URINE CULTURE: Culture: >=100000 — AB

## 2022-05-29 LAB — BASIC METABOLIC PANEL
Anion gap: 11 (ref 5–15)
BUN: 20 mg/dL (ref 8–23)
CO2: 36 mmol/L — ABNORMAL HIGH (ref 22–32)
Calcium: 9 mg/dL (ref 8.9–10.3)
Chloride: 94 mmol/L — ABNORMAL LOW (ref 98–111)
Creatinine, Ser: 0.89 mg/dL (ref 0.44–1.00)
GFR, Estimated: >60 mL/min (ref >60)
Glucose, Bld: 73 mg/dL (ref 70–99)
Potassium: 3.4 mmol/L — ABNORMAL LOW (ref 3.5–5.1)
Sodium: 141 mmol/L (ref 135–145)

## 2022-05-29 LAB — CBC
HCT: 34.6 % — ABNORMAL LOW (ref 36.0–46.0)
Hemoglobin: 10.6 g/dL — ABNORMAL LOW (ref 12.0–15.0)
MCH: 31.1 pg (ref 26.0–34.0)
MCHC: 30.6 g/dL (ref 30.0–36.0)
MCV: 101.5 fL — ABNORMAL HIGH (ref 80.0–100.0)
Platelets: 112 10*3/uL — ABNORMAL LOW (ref 150–400)
RBC: 3.41 MIL/uL — ABNORMAL LOW (ref 3.87–5.11)
RDW: 14.4 % (ref 11.5–15.5)
WBC: 13.9 10*3/uL — ABNORMAL HIGH (ref 4.0–10.5)
nRBC: 0.8 % — ABNORMAL HIGH (ref 0.0–0.2)

## 2022-05-29 LAB — GLUCOSE, CAPILLARY
Glucose-Capillary: 88 mg/dL (ref 70–99)
Glucose-Capillary: 258 mg/dL — ABNORMAL HIGH (ref 70–99)
Glucose-Capillary: 151 mg/dL — ABNORMAL HIGH (ref 70–99)
Glucose-Capillary: 125 mg/dL — ABNORMAL HIGH (ref 70–99)

## 2022-05-29 LAB — AMMONIA: Ammonia: 43 umol/L — ABNORMAL HIGH (ref 9–35)

## 2022-05-29 MED ORDER — POTASSIUM CHLORIDE CRYS ER 20 MEQ PO TBCR
40.0000 meq | EXTENDED_RELEASE_TABLET | Freq: Once | ORAL | Status: AC
  Administered 2022-05-29: 40 meq via ORAL
  Filled 2022-05-29: qty 2

## 2022-05-29 MED ORDER — ENOXAPARIN SODIUM 60 MG/0.6ML IJ SOSY
45.0000 mg | PREFILLED_SYRINGE | INTRAMUSCULAR | Status: DC
  Administered 2022-05-29 – 2022-05-30 (×2): 45 mg via SUBCUTANEOUS
  Filled 2022-05-29 (×3): qty 0.6

## 2022-05-29 NOTE — Procedures (Signed)
Patient Name: Charlene Houston  MRN: [id number]  Epilepsy Attending: Lora Havens  Referring Physician/Provider: Greta Doom, MD  Duration: 05/28/2022 2042 to to 05/29/2022 1716   Patient history: 77yo F with ams. EEG to evaluate for seizure   Level of alertness: Awake   AEDs during EEG study: None   Technical aspects: This EEG study was done with scalp electrodes positioned according to the 10-20 International system of electrode placement. Electrical activity was reviewed with band pass filter of 1-70Hz , sensitivity of 7 uV/mm, display speed of 2mm/sec with a 60Hz  notched filter applied as appropriate. EEG data were recorded continuously and digitally stored.  Video monitoring was available and reviewed as appropriate.   Description: EEG showed continuous generalized 3 to 6 Hz theta-delta slowing. Hyperventilation and photic stimulation were not performed.     Of note, study was technically difficult due to significant electrode artifact.    ABNORMALITY - Continuous slow, generalized   IMPRESSION: This technically difficult study is suggestive of moderate diffuse encephalopathy, nonspecific etiology. No seizures or epileptiform discharges were seen throughout the recording.    Charlene Houston [name]

## 2022-05-29 NOTE — Progress Notes (Signed)
Subjective: Patient is significantly improved  EEG has been negative for seizure.  Exam: Vitals:   05/29/22 1129 05/29/22 1546  BP: 118/76 105/65  Pulse: 81 81  Resp: (!) 24 18  Temp: 97.7 F (36.5 C) 97.8 F (36.6 C)  SpO2: 100% 100%   Gen: In bed, NAD Resp: non-labored breathing, no acute distress Abd: soft, nt  Neuro: MS: She is awake, she is able to tell me that she is in the hospital in [place], but does not know the name of the hospital.  She is much more conversant, and her speech is much more regular than it was on previous days. CN: Extraocular movements are intact, visual fields are full Motor: She moves all extremities symmetrically, no definite focal weakness Sensory: Intact to light touch  EEG with no seizures  Pertinent Labs: B12 836 Folate 18.4 TSH 0.42 A.M. cortisol 78 UA-positive   Impression: 77 year old female with progressive decline in mental status over the past few months in the setting of stage IV cancer.  She did have a positive response to therapy but has unfortunately had a progressive decline in her functional status.  She has a history of B12 deficiency that was found in November 2022 and is treated with good levels today.  My suspicion is that her acute worsening was likely due to her urinary tract infection. She has had multiple hospitalizations over the past two months and my suspicion is that her general decline in that timeframe has been multifactorial.  I would expect her to gradually improve following treatment over the next couple of weeks.  With 48 hours of negative EEG, my suspicion is that the episodes of staring were more encephalopathy related rather than seizures.  Recommendations: 1) continue to treat UTI 2) would expect continued gradual improvement over the next few weeks 3) I would have her follow-up with outpatient neurology 4) neurology will continue to be available as needed.  Roland Rack, MD [facility] [phone]  If 7pm- 7am, please page neurology on call as listed in [place].

## 2022-05-29 NOTE — Progress Notes (Signed)
Palliative Medicine Inpatient Follow Up Note HPI: 77 y.o. female  with past medical history of HTN, DM, arthritis, stage IIIA right breast cancer in 2007 s/p lumpectomy, asthma,?  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 She presented to the ED for AMS and hyperglycemia and was admitted on 05/26/2022 with acute metabolic encephalopathy, UTI, chronic systolic CHF, dehydration/hyponatremia, uncontrolled diabetes type 2 with hyperglycemia, history of stage IV lung cancer, and others.    She is followed at [facility] for her cancer and was treated with immunotherapy Beryle Flock) until July 2023 when she developed complications including pneumonitis prompting discontinuation of treatment and gradual decline since then    PMT was consulted for [place] conversations.  Today's Discussion 05/29/2022  *Please note that this is a verbal dictation therefore any spelling or grammatical errors are due to the "[place] [employer]" system interpretation.  Chart reviewed inclusive of vital signs, progress notes, laboratory results, and diagnostic images.   I met with Roland Rack at bedside this morning. She is somnolent and able to tell me her name and that she is in [facility]. She is otherwise perseverant on the desire to go home. She feels that she is well enough to go home and live with her son at this time and asks me why she remains here. I shared that she has ongoing confusion, an active UTI, and heart failure. We reviewed the work up being complete for her confusion though she was not able to follow the entirety of our conversation as she would intermittently shut her eyes and I would have to say her name again.   I called and spoke to patients daughter, Davy Pique. We reviewed the above. Davy Pique shares that her mother has been recurrently declining. She has realized during patients time  at [facility] that she is not likely to make great improvement. Reviewed the concern in the setting of patients declining physical function and dwindling nutrition.   We did discuss delirium and how sadly this can evolve sometimes into a terminal diagnosis. We reviewed that often patients can reach a new baseline and symptoms such as Faria is experiencing are not uncommon during that time.   Patient daughter, Davy Pique vocalized the difficulty associated with future decisions. She shares that she and her brother have spoken and as much as it [place] she does realize if Jentri neglects to improve in the near future, she may be faced with options such as keeping her comfortable.   We reviewed the plan to continue present measures at this time and to reassess in the oncoming days.   Questions and concerns addressed/Palliative Support Provided.   Objective Assessment: Vital Signs Vitals:   05/28/22 2319 05/29/22 0741  BP: 116/87 118/72  Pulse: 84 91  Resp: 16 (!) 22  Temp: 98.8 F (37.1 C) 97.8 F (36.6 C)  SpO2: 94% 98%    Intake/Output Summary (Last 24 hours) at 05/29/2022 1053 Last data filed at 05/29/2022 0855 Gross per 24 hour  Intake 480 ml  Output 400 ml  Net 80 ml   Last Weight  Most recent update: 05/29/2022  9:03 AM    Weight  90.4 kg (199 lb 4.7 oz)            Gen:  Elderly AA F chronically ill in appearance HEENT: Dry mucous membranes CV: Regular rate and rhythm  PULM: On 2LPM [place], breathing  is even and nonlabored ABD: soft/nontender  EXT: (+) Pedal edema  Neuro: Pleasantly disoriented when aroused though somnolent  SUMMARY OF RECOMMENDATIONS   Remain DNAR/DNI Continue current scope of care Continue workup and treatment of reversible causes Allow time for outcomes Ongoing support --> [place] will resume care on Monday 2/19  Billing based on MDM: High ______________________________________________________________________________________ [place] Team Team Cell Phone: [phone] Please utilize secure chat with additional questions, if there is no response within 30 minutes please call the above phone number  Palliative Medicine Team providers are available by phone from 7am to 7pm daily and can be reached through the team cell phone.  Should this patient require assistance outside of these hours, please call the patient's attending physician.

## 2022-05-29 NOTE — Progress Notes (Signed)
LTM EEG discontinued - no skin breakdown at [facility].  GMD/MB

## 2022-05-29 NOTE — Progress Notes (Signed)
[facility] PROGRESS NOTE   Charlene Houston EPP:[id number] DOB: 1946/02/24 DOA: 05/26/2022  PCP: Lilian Coma., MD  Brief History/Interval Summary: 77 y.o. female with history h/o HTN, DM, arthritis, stage IIIA right breast cancer in 2007 s/p lumpectomy, asthma,?  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  According to the admitting providers conversation with daughter patient has been declining even her mental status over the last 6 to 8 months.  Patient was found to have abnormal UA.  She was hospitalized for further management.    Consultants: Neurology.  Palliative medicine  Procedures: Continuous EEG    Subjective/Interval History: Patient noted to be more responsive this morning compared to yesterday.  Answering more questions appropriately.  Still confused at times.    Assessment/Plan:  Acute metabolic encephalopathy Reason for her mental status change is unclear but probably due to UTI.  However daughter also mentioned that she has had a decline over the past few months. CT head did not show any acute findings.  MRI brain was motion degraded but did not show any acute findings.  No masses or lesions were identified.  Patient does not have any focal neurological deficits.  Continue to treat UTI There was also concern for seizure activity.  EEG was done which did not show any epileptiform or seizure activity.  Neurology was subsequently consulted.  Patient currently on long-term EEG.   She is noted to have B12 deficiency  based on level of 124 back in 2022.  Supposed to be on B12 injections every month.  B12 level was checked and noted to be 836.  Folic acid level is 60.6.  TSH is normal at 0.42.  Ammonia level noted to be mildly elevated at 43.  Likely of no clinical significance. Speech therapy following. Mentation seems to be gradually improving.  Continue to monitor.  Urinary tract infection with Klebsiella Urine culture positive for Klebsiella.  Sensitivities reviewed.  Will give a 5-day course of ceftriaxone.    Chronic systolic CHF EF is known to be about 35%.  Followed by heart failure clinic.  Furosemide was held due to hyponatremia and concern for significant dehydration and hypovolemia.  Spironolactone also on hold.  Home medication is also shows that she is supposed to be on carvedilol and Jardiance. Had to be given IV fluids due to dehydration.  Volume status seems to be stable this morning.  Hold off on further hydration.  Sodium level is stable.  Dehydration/hypernatremia/hypokalemia Likely due to poor oral intake.  Her diuretics were discontinued including furosemide and spironolactone.  She was given IV fluids for 24 hours.  Sodium level has improved.  Continue to monitor for now.  Supplement potassium.  Diabetes mellitus type 2, uncontrolled with hyperglycemia Likely due to steroid use.  HbA1c found to be 8.3. She is noted to be on Jardiance prior to admission.  Also on glimepiride Monitor CBGs.  Continue SSI.  Also noted to be on glargine. Dose of glargine was adjusted yesterday.  CBG noted to be 88 this morning.  Will see what the trends are.  May need to cut back on the dose.  Will discontinue the bedtime coverage.  History of stage IV lung cancer Followed by oncology in [facility].  According to oncology notes patient had reassuring PET scan in December 2023.  She has been off of Keytruda since last summer due to complications of pneumonitis.   Has been on prednisone for same and has been  tapered down gradually.  Reduced to 5 mg.  Previous history of breast cancer She is status postlumpectomy and radiation treatment in 2007.  Leukocytosis Likely multifactorial including infection and chronic steroid use.  Stable.  She is afebrile.  Vitamin B12 deficiency Supposed to get vitamin B12 injections on a monthly basis. Repeat B12 level is 836.  Macrocytic anemia Drop in hemoglobin could be dilutional.  No evidence of overt bleeding.    History of asthma, chronic respiratory failure with hypoxia Uses 2 L of oxygen by nasal cannula prior to admission.  Obesity Estimated body mass index is 33.16 kg/m as calculated from the following:   Height as of this encounter: 5\' 5"  (1.651 m).   Weight as of this encounter: 90.4 kg.  DVT Prophylaxis: Lovenox Code Status: DNR Family Communication: Discussed with her daughter and today. Disposition Plan: From SNF.   Status is: Inpatient Remains inpatient appropriate because: Acute metabolic encephalopathy      Medications: Scheduled:  (feeding supplement) PROSource Plus  30 mL Oral BID BM   carvedilol  3.125 mg Oral BID   cholecalciferol  1,000 Units Oral Daily   diphenhydrAMINE  25 mg Intravenous Once   enoxaparin (LOVENOX) injection  45 mg Subcutaneous Q24H   escitalopram  5 mg Oral Daily   feeding supplement (GLUCERNA 1.2 CAL)  237 mL Oral QPM   fluconazole  100 mg Oral Daily   folic acid  1 mg Oral Daily   insulin aspart  0-15 Units Subcutaneous TID WC   insulin aspart  0-5 Units Subcutaneous QHS   insulin glargine-yfgn  12 Units Subcutaneous QHS   montelukast  10 mg Oral Q1200   potassium chloride SA  20 mEq Oral Daily   predniSONE  5 mg Oral Q breakfast   simvastatin  40 mg Oral QHS   Continuous:  cefTRIAXone (ROCEPHIN)  IV 1 g (05/28/22 1304)   SPQ:[id number], albuterol, hydrALAZINE, hydrOXYzine, ondansetron **OR** ondansetron (ZOFRAN) IV, polyvinyl alcohol, traMADol  Antibiotics: Anti-infectives (From  admission, onward)    Start     Dose/Rate Route Frequency Ordered Stop   05/27/22 1400  cefTRIAXone (ROCEPHIN) 1 g in sodium chloride 0.9 % 100 mL IVPB        1 g 200 mL/hr over 30 Minutes Intravenous Every 24 hours 05/26/22 1541     05/27/22 1230  fluconazole (DIFLUCAN) tablet 100 mg        100 mg Oral Daily 05/27/22 1137 06/01/22 0959   05/26/22 1415  cefTRIAXone (ROCEPHIN) 1 g in sodium chloride 0.9 % 100 mL IVPB        1 g 200 mL/hr over 30 Minutes Intravenous  Once 05/26/22 1409 05/26/22 1523       Objective:  Vital Signs  Vitals:   05/28/22 2146 05/28/22 2319 05/29/22 0741 05/29/22 0900  BP: 106/73 116/87 118/72   Pulse: 91 84 91   Resp:  16 (!) 22   Temp:  98.8 F (37.1 C) 97.8 F (36.6 C)   TempSrc:  Oral Oral   SpO2:  94% 98%   Weight:    90.4 kg  Height:    5\' 5"  (1.651 m)  Intake/Output Summary (Last 24 hours) at 05/29/2022 0933 Last data filed at 05/29/2022 0855 Gross per 24 hour  Intake 480 ml  Output 400 ml  Net 80 ml    Filed Weights   05/29/22 0900  Weight: 90.4 kg    General appearance: Awake alert.  In no distress.  More responsive compared to yesterday but still distracted. Resp: Clear to auscultation bilaterally.  Normal effort Cardio: S1-S2 is normal regular.  No S3-S4.  No rubs murmurs or bruit GI: Abdomen is soft.  Nontender nondistended.  Bowel sounds are present normal.  No masses organomegaly Extremities: No edema.  Full range of motion of lower extremities. Neurologic: Was able to tell me she was in [facility].  Was able to tell me the city and state.  Did not know the year.  No obvious focal deficits noted.   Lab Results:  Data Reviewed: I have personally reviewed following labs and reports of the imaging studies  CBC: Recent Labs  Lab 05/26/22 0940 05/26/22 0957 05/26/22 1804 05/27/22 0108 05/28/22 0649 05/29/22 0438  WBC 14.1*  --  15.4* 16.1* 13.9* 13.9*  NEUTROABS 11.9*  --   --   --   --   --   HGB 11.6*  12.2 12.2 12.2 10.6* 10.6*  HCT 37.5 36.0 39.9 38.6 34.8* 34.6*  MCV 100.0  --  101.8* 98.5 101.8* 101.5*  PLT 148*  --  114* 118* 111* 112*     Basic Metabolic Panel: Recent Labs  Lab 05/26/22 0940 05/26/22 0957 05/26/22 1804 05/27/22 0108 05/28/22 0649 05/29/22 0438  NA 141 140  --  151* 142 141  K 3.9 3.8  --  4.5 3.6 3.4*  CL 87*  --   --  96* 92* 94*  CO2 42*  --   --  30 38* 36*  GLUCOSE 336*  --   --  198* 195* 73  BUN 17  --   --  19 16 20   CREATININE 1.16*  --  1.08* 1.17* 0.89 0.89  CALCIUM 9.0  --   --  9.0 8.6* 9.0     GFR: Estimated Creatinine Clearance: 59.8 mL/min (by C-G formula based on SCr of 0.89 mg/dL).  Liver Function Tests: Recent Labs  Lab 05/26/22 0940  AST 22  ALT 16  ALKPHOS 93  BILITOT 1.2  PROT 5.3*  ALBUMIN 2.4*     Recent Labs  Lab 05/26/22 0940  LIPASE 41     HbA1C: Recent Labs    05/26/22 1804  HGBA1C 8.3*     CBG: Recent Labs  Lab 05/28/22 1147 05/28/22 1652 05/28/22 1857 05/28/22 2124 05/29/22 0612  GLUCAP 395* 508* 391* 165* 88      Recent Results (from the past 240 hour(s))  Resp panel by RT-PCR (RSV, Flu A&B, Covid) Anterior Nasal Swab     Status: None   Collection Time: 05/26/22  9:17 AM   Specimen: Anterior Nasal Swab  Result Value Ref Range Status   SARS Coronavirus 2 by RT PCR NEGATIVE NEGATIVE Final   Influenza A by PCR NEGATIVE NEGATIVE Final   Influenza B by PCR NEGATIVE NEGATIVE Final    Comment: (NOTE) The Xpert Xpress SARS-CoV-2/FLU/RSV plus assay is intended as an aid in the diagnosis of influenza from Nasopharyngeal swab specimens and should not be used as a sole basis for treatment. Nasal washings and aspirates are unacceptable for Xpert Xpress SARS-CoV-2/FLU/RSV testing.  Fact Sheet for Patients: EntrepreneurPulse.com.au  Fact Sheet for Healthcare  Providers: IncredibleEmployment.be  This test is not yet approved or cleared by the [place] and has been authorized for detection and/or diagnosis of SARS-CoV-2 by FDA under an Emergency Use Authorization (EUA). This EUA will remain in effect (meaning this test can be used) for the duration of the COVID-19 declaration under Section 564(b)(1) of the Act, 21 U.S.C. section 360bbb-3(b)(1), unless the authorization is terminated or revoked.     Resp Syncytial Virus by PCR NEGATIVE NEGATIVE Final    Comment: (NOTE) Fact Sheet for Patients: EntrepreneurPulse.com.au  Fact Sheet for Healthcare Providers: IncredibleEmployment.be  This test is not yet approved or cleared by the [place] FDA and has been authorized for detection and/or diagnosis of SARS-CoV-2 by FDA under an Emergency Use Authorization (EUA). This EUA will remain in effect (meaning this test can be used) for the duration of the COVID-19 declaration under Section 564(b)(1) of the Act, 21 U.S.C. section 360bbb-3(b)(1), unless the authorization is terminated or revoked.  Performed at [facility], [place] [address], [place], [place] [address]   Blood culture (routine x 2)     Status: None (Preliminary result)   Collection Time: 05/26/22  2:06 PM   Specimen: BLOOD RIGHT HAND  Result Value Ref Range Status   Specimen Description BLOOD RIGHT HAND  Final   Special Requests   Final    BOTTLES DRAWN AEROBIC AND ANAEROBIC Blood Culture adequate volume   Culture   Final    NO GROWTH 2 DAYS Performed at [facility], [place] [address] [place], [place] [address]    Report Status PENDING  Incomplete  Blood culture (routine x 2)     Status: None (Preliminary result)   Collection Time: 05/26/22  6:05 PM   Specimen: BLOOD  Result Value Ref Range Status   Specimen Description BLOOD BLOOD RIGHT HAND  Final   Special Requests   Final    BOTTLES DRAWN AEROBIC AND ANAEROBIC Blood Culture adequate volume   Culture   Final    NO GROWTH 2 DAYS Performed at [facility], [place] [address] [place], [place] [address]    Report Status PENDING  Incomplete  Urine Culture (for pregnant, neutropenic or urologic patients or patients with an indwelling urinary catheter)     Status: Abnormal   Collection Time: 05/27/22  9:25 AM   Specimen: Urine, Clean Catch  Result Value Ref Range Status   Specimen Description URINE, CLEAN CATCH  Final   Special Requests NONE  Final   Culture (A)  Final    >=100,000 COLONIES/mL KLEBSIELLA PNEUMONIAE Two isolates with different morphologies were identified as the same organism.The most resistant organism was reported. Performed at [facility], [place] [address], [place],  [address]    Report Status 05/29/2022 FINAL  Final   Organism ID, Bacteria KLEBSIELLA PNEUMONIAE (A)  Final      Susceptibility   Klebsiella pneumoniae - MIC*    AMPICILLIN >=32 RESISTANT Resistant     CEFAZOLIN <=4 SENSITIVE Sensitive     CEFEPIME <=0.12 SENSITIVE Sensitive     CEFTRIAXONE <=0.25 SENSITIVE Sensitive     CIPROFLOXACIN <=0.25 SENSITIVE Sensitive     GENTAMICIN <=1 SENSITIVE Sensitive     IMIPENEM <=0.25 SENSITIVE Sensitive     NITROFURANTOIN 64 INTERMEDIATE Intermediate     TRIMETH/SULFA <=20 SENSITIVE Sensitive     AMPICILLIN/SULBACTAM 4 SENSITIVE Sensitive     PIP/TAZO <=4 SENSITIVE Sensitive     * >=100,000 COLONIES/mL  KLEBSIELLA PNEUMONIAE      Radiology Studies: Overnight EEG with video  Result Date: 05/28/2022 Lora Havens, MD     05/29/2022  9:16 AM Patient Name: Charlene Houston MRN: [id number] Epilepsy Attending: Lora Havens Referring Physician/Provider: Greta Doom, MD Duration: 05/27/2022 2042 to to 05/28/2022 2042 Patient history: 77yo F with ams. EEG to evaluate for seizure  Level of alertness: Awake  AEDs during EEG study: None  Technical aspects: This EEG study was done with scalp electrodes positioned according to the 10-20 International system of electrode placement. Electrical  activity was reviewed with band pass filter of 1-70Hz , sensitivity of 7 uV/mm, display speed of 17mm/sec with a 60Hz  notched filter applied as appropriate. EEG data were recorded continuously and digitally stored.  Video monitoring was available and reviewed as appropriate.  Description: EEG showed continuous generalized 3 to 6 Hz theta-delta slowing. Patient was noted to have right arm tremor on 05/28/2022 at 1319. Concomitant eeg before, during and after the event didn't show any eeg change.  Hyperventilation and photic stimulation were not performed.  Of note, study was technically difficult due to significant electrode artifact.  ABNORMALITY - Continuous slow, generalized  IMPRESSION: This technically difficult study is suggestive of moderate diffuse encephalopathy, nonspecific etiology. No seizures or epileptiform discharges were seen throughout the recording. Patient was noted to have right arm tremor on 05/28/2022 at 1319 without concomitant eeg change. The semiology and lack of eeg change suggests it was most likely not an epileptic event. However, focal motor seizures amy not be seen on scalp eeg. Clinical correlation is recommended.  Lora Havens   EEG adult  Result Date: 05/27/2022 Lora Havens, MD     05/27/2022  4:18 PM Patient Name: Charlene Houston MRN: [id number] Epilepsy Attending: Lora Havens Referring Physician/Provider: Bonnielee Haff, MD Date: 05/27/2022 Duration: 26.10 mins Patient history: 77yo F with ams. EEG to evaluate for seizure Level of alertness: Awake AEDs during EEG study: None Technical aspects: This EEG study was done with scalp electrodes positioned according to the 10-20 International system of electrode placement. Electrical activity was reviewed with band pass filter of 1-70Hz , sensitivity of 7 uV/mm, display speed of 41mm/sec with a 60Hz  notched filter applied as appropriate. EEG data were recorded continuously and digitally stored.  Video monitoring was  available and reviewed as appropriate. Description: EEG showed continuous generalized 3 to 6 Hz theta-delta slowing.  Hyperventilation and photic stimulation were not performed.  Of note, study was technically difficult due to significant electrode and movement artifact.  ABNORMALITY - Continuous slow, generalized IMPRESSION: This technically difficult study is suggestive of moderate diffuse encephalopathy, nonspecific etiology. No seizures or epileptiform discharges were seen throughout the recording. Lora Havens   MR BRAIN WO CONTRAST  Result Date: 05/27/2022 CLINICAL DATA:  Altered mental status. History of lung cancer and breast cancer. EXAM: MRI HEAD WITHOUT CONTRAST TECHNIQUE: Multiplanar, multiecho pulse sequences of the brain and surrounding structures were obtained without intravenous contrast. COMPARISON:  CT head 05/26/2022, brain MRI 06/09/2021 FINDINGS: Image quality is motion degraded. Additionally, evaluation for small metastatic lesions is limited in the absence of intravenous contrast. Brain: There is no acute intracranial hemorrhage, extra-axial fluid collection, or acute infarct. There is mild parenchymal volume loss with prominence of the ventricular system and extra-axial CSF spaces, within expected limits for age. The ventricles are normal in size. Parenchymal signal is normal for age, with minimal periventricular FLAIR signal abnormality. The pituitary and suprasellar region are  normal. No definite mass lesion is seen to suggest intracranial metastatic disease. There is no mass effect or midline shift. Vascular: Normal flow voids. Skull and upper cervical spine: Normal marrow signal. Sinuses/Orbits: The paranasal sinuses are clear. Bilateral lens implants are in place. The globes and orbits are otherwise unremarkable. Other: A left mastoid effusion is again seen. The imaged nasopharynx is unremarkable. IMPRESSION: No definite evidence of acute intracranial pathology or intracranial  metastatic disease, within the confines of motion degraded images and noncontrast technique. Electronically Signed   By: Valetta Mole M.D.   On: 05/27/2022 09:52       LOS: 2 days   [facility] Pager on [email or web address]  05/29/2022, 9:33 AM

## 2022-05-30 DIAGNOSIS — I5022 Chronic systolic (congestive) heart failure: Secondary | ICD-10-CM | POA: Diagnosis not present

## 2022-05-30 DIAGNOSIS — B9689 Other specified bacterial agents as the cause of diseases classified elsewhere: Secondary | ICD-10-CM

## 2022-05-30 DIAGNOSIS — N390 Urinary tract infection, site not specified: Secondary | ICD-10-CM | POA: Diagnosis not present

## 2022-05-30 DIAGNOSIS — Z7189 Other specified counseling: Secondary | ICD-10-CM | POA: Diagnosis not present

## 2022-05-30 DIAGNOSIS — G9341 Metabolic encephalopathy: Secondary | ICD-10-CM | POA: Diagnosis not present

## 2022-05-30 DIAGNOSIS — Z515 Encounter for palliative care: Secondary | ICD-10-CM | POA: Diagnosis not present

## 2022-05-30 LAB — BASIC METABOLIC PANEL
Anion gap: 10 (ref 5–15)
BUN: 19 mg/dL (ref 8–23)
CO2: 33 mmol/L — ABNORMAL HIGH (ref 22–32)
Calcium: 8.5 mg/dL — ABNORMAL LOW (ref 8.9–10.3)
Chloride: 95 mmol/L — ABNORMAL LOW (ref 98–111)
Creatinine, Ser: 0.71 mg/dL (ref 0.44–1.00)
GFR, Estimated: >60 mL/min (ref >60)
Glucose, Bld: 152 mg/dL — ABNORMAL HIGH (ref 70–99)
Potassium: 4.2 mmol/L (ref 3.5–5.1)
Sodium: 138 mmol/L (ref 135–145)

## 2022-05-30 LAB — GLUCOSE, CAPILLARY
Glucose-Capillary: 147 mg/dL — ABNORMAL HIGH (ref 70–99)
Glucose-Capillary: 174 mg/dL — ABNORMAL HIGH (ref 70–99)
Glucose-Capillary: 145 mg/dL — ABNORMAL HIGH (ref 70–99)
Glucose-Capillary: 300 mg/dL — ABNORMAL HIGH (ref 70–99)
Glucose-Capillary: 364 mg/dL — ABNORMAL HIGH (ref 70–99)

## 2022-05-30 LAB — MAGNESIUM: Magnesium: 1.7 mg/dL (ref 1.7–2.4)

## 2022-05-30 MED ORDER — FUROSEMIDE 40 MG PO TABS
40.0000 mg | ORAL_TABLET | Freq: Every day | ORAL | Status: DC
  Administered 2022-05-30 – 2022-06-01 (×3): 40 mg via ORAL
  Filled 2022-05-30 (×3): qty 1

## 2022-05-30 NOTE — Progress Notes (Signed)
[facility] PROGRESS NOTE   Gwendalynn Eckstrom RCV:[id number] DOB: 08-04-1945 DOA: 05/26/2022  PCP: Lilian Coma., MD  Brief History/Interval Summary: 77 y.o. female with history h/o HTN, DM, arthritis, stage IIIA right breast cancer in 2007 s/p lumpectomy, asthma,?  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  According to the admitting providers conversation with daughter patient has been declining even her mental status over the last 6 to 8 months.  Patient was found to have abnormal UA.  She was hospitalized for further management.    Consultants: Neurology.  Palliative medicine  Procedures: Continuous EEG    Subjective/Interval History: Patient noted to be sitting up in the bed and trying to eat her breakfast.  Denies any complaints this morning.     Assessment/Plan:  Acute metabolic encephalopathy Reason for her mental status change is unclear but probably due to UTI.  However daughter also mentioned that she has had a decline over the past few months. CT head did not show any acute findings.  MRI brain was motion degraded but did not show any acute findings.  No masses or lesions were identified.  Patient does not have any focal neurological deficits.  Continue to treat UTI There was also concern for seizure activity.  EEG was done which did not show any epileptiform or seizure activity.  Neurology was subsequently consulted.  Patient placed on long-term EEG.  No epileptiform or seizure activity noted.  She is noted to have  B12 deficiency based on level of 124 back in 2022.  Supposed to be on B12 injections every month.  B12 level was checked and noted to be 836.  Folic acid level is 10.2.  TSH is normal at 0.42.  Ammonia level noted to be mildly elevated at 43.  Likely of no clinical significance. Mentation has been gradually improving.  Significantly better this morning compared to 3 days ago.  Trying to eat her breakfast.  Currently on dysphagia 1 diet. Neurology has signed off.  She is off of continuous EEG.  Neurology to arrange outpatient f/u.  Urinary tract infection with Klebsiella Urine culture positive for Klebsiella.  Sensitivities reviewed.  Will give a 5-day course of ceftriaxone.    Chronic systolic CHF EF is known to be about 35%.  Followed by heart failure clinic.  Furosemide was held due to hyponatremia and concern for significant dehydration and hypovolemia.  Spironolactone also on hold.  Home medication is also shows that she is supposed to be on carvedilol and Jardiance. Had to be given IV fluids due to dehydration.  Volume status noted to be slightly up today.  Will resume her furosemide. Sodium level is stable.  Dehydration/hypernatremia/hypokalemia Likely due to poor oral intake.  Diuretics were held.  She was given IV fluids.  Improved. Electrolytes are stable.   Diabetes mellitus type 2, uncontrolled with hyperglycemia Likely due to steroid use.  HbA1c found to be 8.3. She is noted to be on Jardiance prior to admission.  Also on glimepiride Monitor CBGs.  Continue SSI.  Also noted to be on glargine. Dose of glargine was adjusted.  Improvement in glucose levels noted.  History of stage IV lung cancer Followed by oncology in [facility].  According to oncology notes patient had reassuring PET scan in December 2023.  She has been off of Keytruda since last summer due to complications of pneumonitis.   Has been on prednisone for same and has been tapered down gradually.  Reduced to 5  mg.  Previous history of breast cancer She is status postlumpectomy and radiation treatment in 2007.  Leukocytosis Likely multifactorial including infection and chronic steroid use.  Stable.  She is afebrile.  Vitamin B12 deficiency Supposed to get vitamin B12 injections on a monthly basis. Repeat B12 level is 836.  Macrocytic anemia Drop in hemoglobin could be dilutional.  No evidence of overt bleeding.    History of asthma, chronic respiratory failure with hypoxia Uses 2 L of oxygen by nasal cannula prior to admission.  Obesity Estimated body mass index is 33.16 kg/m as calculated from the following:   Height as of this encounter: 5\' 5"  (1.651 m).   Weight as of this encounter: 90.4 kg.  DVT Prophylaxis: Lovenox Code Status: DNR Family Communication: Discussed with patient.  No family at bedside.  Discussed with palliative medicine. Disposition Plan: From SNF.  Anticipate discharge back to SNF in 24 to 48 hours depending on her oral intake.   Status is: Inpatient Remains inpatient appropriate because: Acute metabolic encephalopathy      Medications: Scheduled:  (feeding supplement) PROSource Plus  30 mL Oral BID BM   carvedilol  3.125 mg Oral BID   cholecalciferol  1,000 Units Oral Daily   diphenhydrAMINE  25 mg Intravenous Once   enoxaparin (LOVENOX) injection  45 mg Subcutaneous Q24H   escitalopram  5 mg Oral Daily   feeding supplement (GLUCERNA 1.2 CAL)  237 mL Oral QPM   fluconazole  100 mg Oral Daily   folic acid  1 mg Oral Daily   insulin aspart  0-15 Units Subcutaneous TID WC   insulin glargine-yfgn  12 Units Subcutaneous QHS   montelukast  10 mg Oral Q1200   potassium chloride SA  20 mEq Oral Daily   predniSONE  5 mg Oral Q breakfast   simvastatin  40 mg Oral QHS   Continuous:  cefTRIAXone (ROCEPHIN)  IV 1 g (05/29/22 1338)   LTJ:QZESPQZRAQTMA, albuterol, hydrALAZINE, hydrOXYzine, ondansetron **OR** ondansetron (ZOFRAN) IV, polyvinyl alcohol,  traMADol  Antibiotics: Anti-infectives (From admission, onward)    Start     Dose/Rate Route Frequency Ordered Stop   05/27/22 1400  cefTRIAXone (ROCEPHIN) 1 g in sodium chloride 0.9 % 100 mL IVPB        1 g 200 mL/hr over 30 Minutes Intravenous Every 24 hours 05/26/22 1541 06/01/22 1359   05/27/22 1230  fluconazole (DIFLUCAN) tablet 100 mg        100 mg Oral Daily 05/27/22 1137 06/01/22 0959   05/26/22 1415  cefTRIAXone (ROCEPHIN) 1 g in sodium chloride 0.9 % 100 mL IVPB        1 g 200 mL/hr over 30 Minutes Intravenous  Once 05/26/22 1409 05/26/22 1523       Objective:  Vital Signs  Vitals:   05/29/22 2010 05/30/22 0010 05/30/22 0320 05/30/22 0754  BP: 112/67 115/69 103/63 105/73  Pulse: 81 70 80 90  Resp: 14 15 16  (!) 22  Temp: 98.1 F (36.7 C) 98.2 F (36.8 C) 97.6 F (36.4 C) 98.4 F (36.9 C)  TempSrc: Oral Oral Oral Oral  SpO2: 95% 95% 100% 100%  Weight:  Height:        Intake/Output Summary (Last 24 hours) at 05/30/2022 1035 Last data filed at 05/29/2022 2352 Gross per 24 hour  Intake --  Output 400 ml  Net -400 ml    Filed Weights   05/29/22 0900  Weight: 90.4 kg    General appearance: Awake alert.  In no distress Resp: Clear to auscultation bilaterally.  Normal effort Cardio: S1-S2 is normal regular.  No S3-S4.  No rubs murmurs or bruit GI: Abdomen is soft.  Nontender nondistended.  Bowel sounds are present normal.  No masses organomegaly Extremities: Mild edema bilateral lower extremities Awake alert.  Still somewhat distracted but mentation significantly improved compared to few days ago.  No obvious focal neurological deficits but physical deconditioning is noted.   Lab Results:  Data Reviewed: I have personally reviewed following labs and reports of the imaging studies  CBC: Recent Labs  Lab 05/26/22 0940 05/26/22 0957 05/26/22 1804 05/27/22 0108 05/28/22 0649 05/29/22 0438  WBC 14.1*  --  15.4* 16.1* 13.9* 13.9*  NEUTROABS 11.9*   --   --   --   --   --   HGB 11.6* 12.2 12.2 12.2 10.6* 10.6*  HCT 37.5 36.0 39.9 38.6 34.8* 34.6*  MCV 100.0  --  101.8* 98.5 101.8* 101.5*  PLT 148*  --  114* 118* 111* 112*     Basic Metabolic Panel: Recent Labs  Lab 05/26/22 0940 05/26/22 0957 05/26/22 1804 05/27/22 0108 05/28/22 0649 05/29/22 0438 05/30/22 0344  NA 141 140  --  151* 142 141 138  K 3.9 3.8  --  4.5 3.6 3.4* 4.2  CL 87*  --   --  96* 92* 94* 95*  CO2 42*  --   --  30 38* 36* 33*  GLUCOSE 336*  --   --  198* 195* 73 152*  BUN 17  --   --  19 16 20 19   CREATININE 1.16*  --  1.08* 1.17* 0.89 0.89 0.71  CALCIUM 9.0  --   --  9.0 8.6* 9.0 8.5*  MG  --   --   --   --   --   --  1.7     GFR: Estimated Creatinine Clearance: 66.5 mL/min (by C-G formula based on SCr of 0.71 mg/dL).  Liver Function Tests: Recent Labs  Lab 05/26/22 0940  AST 22  ALT 16  ALKPHOS 93  BILITOT 1.2  PROT 5.3*  ALBUMIN 2.4*     Recent Labs  Lab 05/26/22 0940  LIPASE 41     CBG: Recent Labs  Lab 05/29/22 0612 05/29/22 1132 05/29/22 1549 05/29/22 2126 05/30/22 0618  GLUCAP 88 258* 151* 125* 147*      Recent Results (from the past 240 hour(s))  Resp panel by RT-PCR (RSV, Flu A&B, Covid) Anterior Nasal Swab     Status: None   Collection Time: 05/26/22  9:17 AM   Specimen: Anterior Nasal Swab  Result Value Ref Range Status   SARS Coronavirus 2 by RT PCR NEGATIVE NEGATIVE Final   Influenza A by PCR NEGATIVE NEGATIVE Final   Influenza B by PCR NEGATIVE NEGATIVE Final    Comment: (NOTE) The Xpert Xpress SARS-CoV-2/FLU/RSV plus assay is intended as an aid in the diagnosis of influenza from Nasopharyngeal swab specimens and should not be used as a sole basis for treatment. Nasal washings and aspirates are unacceptable for Xpert Xpress SARS-CoV-2/FLU/RSV testing.  Fact Sheet for Patients: EntrepreneurPulse.com.au  Fact Sheet for  Healthcare  Providers: IncredibleEmployment.be  This test is not yet approved or cleared by the [place] and has been authorized for detection and/or diagnosis of SARS-CoV-2 by FDA under an Emergency Use Authorization (EUA). This EUA will remain in effect (meaning this test can be used) for the duration of the COVID-19 declaration under Section 564(b)(1) of the Act, 21 U.S.C. section 360bbb-3(b)(1), unless the authorization is terminated or revoked.     Resp Syncytial Virus by PCR NEGATIVE NEGATIVE Final    Comment: (NOTE) Fact Sheet for Patients: EntrepreneurPulse.com.au  Fact Sheet for Healthcare Providers: IncredibleEmployment.be  This test is not yet approved or cleared by the [place] FDA and has been authorized for detection and/or diagnosis of SARS-CoV-2 by FDA under an Emergency Use Authorization (EUA). This EUA will remain in effect (meaning this test can be used) for the duration of the COVID-19 declaration under Section 564(b)(1) of the Act, 21 U.S.C. section 360bbb-3(b)(1), unless the authorization is terminated or revoked.  Performed at [facility], [place] [address] [place], [place] [address]   Blood culture (routine x 2)     Status: None (Preliminary result)   Collection Time: 05/26/22  2:06 PM   Specimen: BLOOD RIGHT HAND  Result Value Ref Range Status   Specimen Description BLOOD RIGHT HAND  Final   Special Requests   Final    BOTTLES DRAWN AEROBIC AND ANAEROBIC Blood Culture adequate volume   Culture   Final    NO GROWTH 4 DAYS Performed at [facility], [place] [address], [place], [place] [address]    Report Status PENDING  Incomplete  Blood culture (routine x 2)     Status: None (Preliminary result)   Collection Time: 05/26/22  6:05 PM   Specimen: BLOOD  Result Value Ref Range Status   Specimen Description BLOOD BLOOD RIGHT HAND  Final   Special Requests   Final    BOTTLES DRAWN AEROBIC  AND ANAEROBIC Blood Culture adequate volume   Culture   Final    NO GROWTH 4 DAYS Performed at [facility], [place] [address], [place], [place] [address]    Report Status PENDING  Incomplete  Urine Culture (for pregnant, neutropenic or urologic patients or patients with an indwelling urinary catheter)     Status: Abnormal   Collection Time: 05/27/22  9:25 AM   Specimen: Urine, Clean Catch  Result Value Ref Range Status   Specimen Description URINE, CLEAN CATCH  Final   Special Requests NONE  Final   Culture (A)  Final    >=100,000 COLONIES/mL KLEBSIELLA PNEUMONIAE Two isolates with different morphologies were identified as the same organism.The most resistant organism was reported. Performed at [facility], [place] [address], [place], [place] [address]    Report Status 05/29/2022 FINAL  Final   Organism ID, Bacteria KLEBSIELLA PNEUMONIAE (A)  Final      Susceptibility   Klebsiella pneumoniae - MIC*    AMPICILLIN >=32 RESISTANT Resistant     CEFAZOLIN <=4 SENSITIVE Sensitive     CEFEPIME <=0.12 SENSITIVE Sensitive     CEFTRIAXONE <=0.25 SENSITIVE Sensitive     CIPROFLOXACIN <=0.25 SENSITIVE Sensitive     GENTAMICIN <=1 SENSITIVE Sensitive     IMIPENEM <=0.25 SENSITIVE Sensitive     NITROFURANTOIN 64 INTERMEDIATE Intermediate     TRIMETH/SULFA <=20 SENSITIVE Sensitive     AMPICILLIN/SULBACTAM 4 SENSITIVE Sensitive     PIP/TAZO <=4 SENSITIVE Sensitive     * >=100,000  COLONIES/mL KLEBSIELLA PNEUMONIAE      Radiology Studies: No results found.     LOS: 3 days   Kriss Perleberg [employer] on [email or web address]  05/30/2022, 10:35 AM

## 2022-05-30 NOTE — Progress Notes (Signed)
Palliative Medicine Inpatient Follow Up Note HPI: 77 y.o. female  with past medical history of HTN, DM, arthritis, stage IIIA right breast cancer in 2007 s/p lumpectomy, asthma,?  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 She presented to the ED for AMS and hyperglycemia and was admitted on 05/26/2022 with acute metabolic encephalopathy, UTI, chronic systolic CHF, dehydration/hyponatremia, uncontrolled diabetes type 2 with hyperglycemia, history of stage IV lung cancer, and others.    She is followed at [facility] for her cancer and was treated with immunotherapy Beryle Flock) until July 2023 when she developed complications including pneumonitis prompting discontinuation of treatment and gradual decline since then    PMT was consulted for [place] conversations.  Today's Discussion 05/30/2022  *Please note that this is a verbal dictation therefore any spelling or grammatical errors are due to the "[employer]" system interpretation.  Chart reviewed inclusive of vital signs, progress notes, laboratory results, and diagnostic images.   I met with Charlene Houston at bedside this morning. She is alert and oriented to person and place. She can tell me again that she is in "[facility]" but does not know which location. She shares that she would like her dentures and to get out of the bed. I shared that I would communicate this to the nursing staff.   I met with the Physical therapist who evaluated Charlene Houston on 2/16 who shares that the patient was max assistance, indicative of a long rehab journey a ahead.  I called patients daughter, Charlene Houston who states that she was able to see Charlene Houston later in the day yesterday and she felt that her mental state was improving. We reviewed that with the treatment of patients UTI there should overtime be improvements. Charlene Houston shares continued hope for  ongoing improvement. She realizes her mother is severely weakened and understands the long term impact of this. At this time she is hoping additional rehabilitation will support her mothers improvements.   Questions and concerns addressed/Palliative Support Provided.   Objective Assessment: Vital Signs Vitals:   05/30/22 0320 05/30/22 0754  BP: 103/63 105/73  Pulse: 80 90  Resp: 16 (!) 22  Temp: 97.6 F (36.4 C) 98.4 F (36.9 C)  SpO2: 100% 100%    Intake/Output Summary (Last 24 hours) at 05/30/2022 1014 Last data filed at 05/29/2022 2352 Gross per 24 hour  Intake --  Output 400 ml  Net -400 ml    Last Weight  Most recent update: 05/29/2022  9:03 AM    Weight  90.4 kg (199 lb 4.7 oz)            Gen:  Elderly AA F chronically ill in appearance HEENT: Dry mucous membranes CV: Regular rate and rhythm  PULM: On 2LPM [place], breathing is even and nonlabored ABD: soft/nontender  EXT: (+) Pedal edema  Neuro: Pleasantly disoriented when aroused though somnolent  SUMMARY OF RECOMMENDATIONS   Remain DNAR/DNI Continue current scope of care --> Slowly improving Allow time for outcomes Ongoing support --> Charlene Houston will resume care on Monday 2/19  Billing based on MDM: High ______________________________________________________________________________________ [facility] Team Team Cell Phone: [phone] Please utilize secure chat with additional questions, if there is no response within 30 minutes please call the above phone number  Palliative Medicine Team providers are available by phone from 7am to 7pm daily and can be reached through the team  cell phone.  Should this patient require assistance outside of these hours, please call the patient's attending physician.

## 2022-05-31 DIAGNOSIS — Z66 Do not resuscitate: Secondary | ICD-10-CM | POA: Diagnosis not present

## 2022-05-31 DIAGNOSIS — N390 Urinary tract infection, site not specified: Secondary | ICD-10-CM | POA: Diagnosis not present

## 2022-05-31 DIAGNOSIS — Z7189 Other specified counseling: Secondary | ICD-10-CM | POA: Diagnosis not present

## 2022-05-31 DIAGNOSIS — Z515 Encounter for palliative care: Secondary | ICD-10-CM | POA: Diagnosis not present

## 2022-05-31 DIAGNOSIS — G9341 Metabolic encephalopathy: Secondary | ICD-10-CM | POA: Diagnosis not present

## 2022-05-31 DIAGNOSIS — B9689 Other specified bacterial agents as the cause of diseases classified elsewhere: Secondary | ICD-10-CM | POA: Diagnosis not present

## 2022-05-31 DIAGNOSIS — R4182 Altered mental status, unspecified: Secondary | ICD-10-CM | POA: Diagnosis not present

## 2022-05-31 DIAGNOSIS — I5022 Chronic systolic (congestive) heart failure: Secondary | ICD-10-CM | POA: Diagnosis not present

## 2022-05-31 LAB — CULTURE, BLOOD (ROUTINE X 2)
Culture: NO GROWTH
Culture: NO GROWTH
Special Requests: ADEQUATE
Special Requests: ADEQUATE

## 2022-05-31 LAB — GLUCOSE, CAPILLARY
Glucose-Capillary: 388 mg/dL — ABNORMAL HIGH (ref 70–99)
Glucose-Capillary: 248 mg/dL — ABNORMAL HIGH (ref 70–99)
Glucose-Capillary: 341 mg/dL — ABNORMAL HIGH (ref 70–99)
Glucose-Capillary: 208 mg/dL — ABNORMAL HIGH (ref 70–99)
Glucose-Capillary: 70 mg/dL (ref 70–99)
Glucose-Capillary: 146 mg/dL — ABNORMAL HIGH (ref 70–99)

## 2022-05-31 LAB — BASIC METABOLIC PANEL
Anion gap: 11 (ref 5–15)
BUN: 16 mg/dL (ref 8–23)
CO2: 34 mmol/L — ABNORMAL HIGH (ref 22–32)
Calcium: 8.5 mg/dL — ABNORMAL LOW (ref 8.9–10.3)
Chloride: 92 mmol/L — ABNORMAL LOW (ref 98–111)
Creatinine, Ser: 0.93 mg/dL (ref 0.44–1.00)
GFR, Estimated: >60 mL/min (ref >60)
Glucose, Bld: 283 mg/dL — ABNORMAL HIGH (ref 70–99)
Potassium: 3.8 mmol/L (ref 3.5–5.1)
Sodium: 137 mmol/L (ref 135–145)

## 2022-05-31 MED ORDER — INSULIN GLARGINE-YFGN 100 UNIT/ML ~~LOC~~ SOLN
16.0000 [IU] | Freq: Every day | SUBCUTANEOUS | Status: DC
  Administered 2022-05-31: 16 [IU] via SUBCUTANEOUS
  Filled 2022-05-31 (×2): qty 0.16

## 2022-05-31 MED ORDER — ENOXAPARIN SODIUM 40 MG/0.4ML IJ SOSY
40.0000 mg | PREFILLED_SYRINGE | INTRAMUSCULAR | Status: DC
  Administered 2022-05-31: 40 mg via SUBCUTANEOUS
  Filled 2022-05-31: qty 0.4

## 2022-05-31 MED ORDER — GERHARDT'S BUTT CREAM
TOPICAL_CREAM | Freq: Three times a day (TID) | CUTANEOUS | Status: DC
  Filled 2022-05-31: qty 1

## 2022-05-31 MED ORDER — INSULIN ASPART 100 UNIT/ML IJ SOLN
0.0000 [IU] | Freq: Three times a day (TID) | INTRAMUSCULAR | Status: DC
  Administered 2022-05-31: 7 [IU] via SUBCUTANEOUS
  Administered 2022-05-31: 15 [IU] via SUBCUTANEOUS

## 2022-05-31 MED ORDER — MEDIHONEY WOUND/BURN DRESSING EX PSTE
1.0000 | PASTE | Freq: Every day | CUTANEOUS | Status: DC
  Administered 2022-05-31 – 2022-06-01 (×2): 1 via TOPICAL
  Filled 2022-05-31: qty 44

## 2022-05-31 MED ORDER — INSULIN ASPART 100 UNIT/ML IJ SOLN
4.0000 [IU] | Freq: Three times a day (TID) | INTRAMUSCULAR | Status: DC
  Administered 2022-05-31 – 2022-06-01 (×3): 4 [IU] via SUBCUTANEOUS

## 2022-05-31 MED ORDER — INSULIN ASPART 100 UNIT/ML IJ SOLN: 0.0000 [IU] | Freq: Every day | INTRAMUSCULAR | Status: DC

## 2022-05-31 NOTE — Progress Notes (Signed)
[facility] PROGRESS NOTE   Charlene Houston NUU:[id number] DOB: Aug 25, 1945 DOA: 05/26/2022  PCP: Lilian Coma., MD  Brief History/Interval Summary: 77 y.o. female with history h/o HTN, DM, arthritis, stage IIIA right breast cancer in 2007 s/p lumpectomy, asthma,?  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  According to the admitting providers conversation with daughter patient has been declining even her mental status over the last 6 to 8 months.  Patient was found to have abnormal UA.  She was hospitalized for further management.    Consultants: Neurology.  Palliative medicine  Procedures: Continuous EEG    Subjective/Interval History: Patient sitting up on her bed.  Wide-awake.  Answering all my questions appropriately.  Denies any complaints.  Assessment/Plan:  Acute metabolic encephalopathy Reason for her mental status change is unclear but probably due to UTI.  However daughter also mentioned that she has had a decline over the past few months. CT head did not show any acute findings.  MRI brain was motion degraded but did not show any acute findings.  No masses or lesions were identified.  Patient does not have any focal neurological deficits.   There was also concern for seizure activity.  EEG was done which did not show any epileptiform or seizure activity.  Neurology was subsequently consulted.  Patient placed on long-term EEG.  No epileptiform or seizure activity noted.  She is noted to have B12 deficiency based on  level of 124 back in 2022.  Supposed to be on B12 injections every month.  B12 level was checked and noted to be 836.  Folic acid level is 34.7.  TSH is normal at 0.42.  Ammonia level noted to be mildly elevated at 43.  Likely of no clinical significance. Mentation has improved in the last 2 to 3 days.  Seems to be close to baseline now.  So it is quite possible that it was indeed a UTI that caused her encephalopathy. She seems to be tolerating her diet well.   Neurology has signed off.  She is off of continuous EEG.  Neurology to arrange outpatient f/u.  Urinary tract infection with Klebsiella Urine culture positive for Klebsiella.  Sensitivities reviewed.  Will give a 5-day course of ceftriaxone.  Will recheck CBC tomorrow and if WBC still elevated we may consider extending course of antibiotics.  Chronic systolic CHF EF is known to be about 35%.  Followed by heart failure clinic.  Furosemide was held due to hyponatremia and concern for significant dehydration and hypovolemia.  Spironolactone also on hold.  Home medication list also shows that she is supposed to be on carvedilol and Jardiance. Had to be given IV fluids due to dehydration.  Furosemide and carvedilol was resumed yesterday.  Volume status seems to be stable. Sodium level is normal.  Dehydration/hypernatremia/hypokalemia Likely due to poor oral intake.  Diuretics were held.  She was given IV fluids.  Improved. Electrolytes are stable.   Diabetes mellitus type 2, uncontrolled with hyperglycemia Likely due to steroid use.  HbA1c found to be 8.3. She is noted to be on Jardiance prior to admission.  Also on glimepiride  Monitor CBGs.  Continue SSI.  Also noted to be on glargine. Glucose levels noted to be elevated in the last 24 hours likely due to improvement in oral intake.  Will increase the dose of her glargine.  History of stage IV lung cancer Followed by oncology in [facility].  According to oncology notes patient had  reassuring PET scan in December 2023.  She has been off of Keytruda since last summer due to complications of pneumonitis.   Has been on prednisone for same and has been tapered down gradually.  Reduced to 5 mg.  Previous history of breast cancer She is status postlumpectomy and radiation treatment in 2007.  Leukocytosis Likely multifactorial including infection and chronic steroid use.  Stable.  She is afebrile.  Recheck labs tomorrow.  Vitamin B12 deficiency Supposed to get vitamin B12 injections on a monthly basis. Repeat B12 level is 836.  Macrocytic anemia Drop in hemoglobin could be dilutional.  No evidence of overt bleeding.    History of asthma, chronic respiratory failure with hypoxia Uses 2 L of oxygen by nasal cannula prior to admission.  Obesity Estimated body mass index is 33.16 kg/m as calculated from the following:   Height as of this encounter: 5\' 5"  (1.651 m).   Weight as of this encounter: 90.4 kg.  DVT Prophylaxis: Lovenox Code Status: DNR Family Communication: Discussed with patient.  Will update her daughter later today. Disposition Plan: From SNF.  Anticipate discharge back to SNF on 2/20.     Status is: Inpatient Remains inpatient appropriate because: Acute metabolic encephalopathy      Medications: Scheduled:  (feeding supplement) PROSource Plus  30 mL Oral BID BM   carvedilol  3.125 mg Oral BID   cholecalciferol  1,000 Units Oral Daily   diphenhydrAMINE  25 mg Intravenous Once   enoxaparin (LOVENOX) injection  45 mg Subcutaneous Q24H   escitalopram  5 mg Oral Daily   feeding supplement (GLUCERNA 1.2 CAL)  237 mL Oral QPM   folic acid  1 mg Oral Daily   furosemide  40 mg Oral Daily   insulin aspart  0-15 Units Subcutaneous TID WC   insulin glargine-yfgn  12 Units Subcutaneous QHS   montelukast  10 mg Oral Q1200   potassium chloride SA  20 mEq Oral Daily   predniSONE  5 mg Oral Q breakfast   simvastatin  40 mg Oral QHS   Continuous:   cefTRIAXone (ROCEPHIN)  IV 1 g (05/30/22 1340)   QXI:[id number], albuterol, hydrALAZINE, hydrOXYzine, ondansetron **OR** ondansetron (ZOFRAN) IV, polyvinyl alcohol, traMADol  Antibiotics: Anti-infectives (From admission, onward)    Start     Dose/Rate Route Frequency Ordered Stop   05/27/22 1400  cefTRIAXone (ROCEPHIN) 1 g in sodium chloride 0.9 % 100 mL IVPB        1 g 200 mL/hr over 30 Minutes Intravenous Every 24 hours 05/26/22 1541 06/01/22 1359   05/27/22 1230  fluconazole (DIFLUCAN) tablet 100 mg        100 mg Oral Daily 05/27/22 1137 05/31/22 0850   05/26/22 1415  cefTRIAXone (ROCEPHIN) 1 g in sodium chloride 0.9 % 100 mL IVPB        1 g 200 mL/hr over 30 Minutes Intravenous  Once 05/26/22 1409 05/26/22 1523       Objective:  Vital Signs  Vitals:   05/30/22 2102 05/30/22 2325 05/31/22 0310 05/31/22 0728  BP: 105/67 100/69 101/69 101/61  Pulse: 100 81 79 78  Resp: 18 18 18  18  Temp: 97.8 F (36.6 C) 97.9 F (36.6 C) 97.9 F (36.6 C) 97.7 F (36.5 C)  TempSrc: Oral Oral Oral Oral  SpO2: 92% 100% 100% 100%  Weight:      Height:        Intake/Output Summary (Last 24 hours) at 05/31/2022 0855 Last data filed at 05/30/2022 1745 Gross per 24 hour  Intake 100 ml  Output 500 ml  Net -400 ml    Filed Weights   05/29/22 0900  Weight: 90.4 kg    General appearance: Awake alert.  In no distress Resp: Clear to auscultation bilaterally.  Normal effort Cardio: S1-S2 is normal regular.  No S3-S4.  No rubs murmurs or bruit GI: Abdomen is soft.  Nontender nondistended.  Bowel sounds are present normal.  No masses organomegaly Extremities: Mild edema bilateral lower extremities No obvious focal neurological deficits noted  Lab Results:  Data Reviewed: I have personally reviewed following labs and reports of the imaging studies  CBC: Recent Labs  Lab 05/26/22 0940 05/26/22 0957 05/26/22 1804 05/27/22 0108 05/28/22 0649 05/29/22 0438  WBC 14.1*  --  15.4*  16.1* 13.9* 13.9*  NEUTROABS 11.9*  --   --   --   --   --   HGB 11.6* 12.2 12.2 12.2 10.6* 10.6*  HCT 37.5 36.0 39.9 38.6 34.8* 34.6*  MCV 100.0  --  101.8* 98.5 101.8* 101.5*  PLT 148*  --  114* 118* 111* 112*     Basic Metabolic Panel: Recent Labs  Lab 05/27/22 0108 05/28/22 0649 05/29/22 0438 05/30/22 0344 05/31/22 0401  NA 151* 142 141 138 137  K 4.5 3.6 3.4* 4.2 3.8  CL 96* 92* 94* 95* 92*  CO2 30 38* 36* 33* 34*  GLUCOSE 198* 195* 73 152* 283*  BUN 19 16 20 19 16   CREATININE 1.17* 0.89 0.89 0.71 0.93  CALCIUM 9.0 8.6* 9.0 8.5* 8.5*  MG  --   --   --  1.7  --      GFR: Estimated Creatinine Clearance: 57.2 mL/min (by C-G formula based on SCr of 0.93 mg/dL).  Liver Function Tests: Recent Labs  Lab 05/26/22 0940  AST 22  ALT 16  ALKPHOS 93  BILITOT 1.2  PROT 5.3*  ALBUMIN 2.4*     Recent Labs  Lab 05/26/22 0940  LIPASE 41     CBG: Recent Labs  Lab 05/30/22 1156 05/30/22 1530 05/30/22 2100 05/30/22 2127 05/31/22 0610  GLUCAP 174* 145* 364* 300* 388*      Recent Results (from the past 240 hour(s))  Resp panel by RT-PCR (RSV, Flu A&B, Covid) Anterior Nasal Swab     Status: None   Collection Time: 05/26/22  9:17 AM   Specimen: Anterior Nasal Swab  Result Value Ref Range Status   SARS Coronavirus 2 by RT PCR NEGATIVE NEGATIVE Final   Influenza A by PCR NEGATIVE NEGATIVE Final   Influenza B by PCR NEGATIVE NEGATIVE Final    Comment: (NOTE) The Xpert Xpress SARS-CoV-2/FLU/RSV plus assay is intended as an aid in the diagnosis of influenza from Nasopharyngeal swab specimens and should not be used as a sole basis for treatment. Nasal washings and aspirates are unacceptable for Xpert Xpress SARS-CoV-2/FLU/RSV testing.  Fact Sheet for Patients: EntrepreneurPulse.com.au  Fact Sheet for Healthcare Providers: IncredibleEmployment.be  This test is not yet approved or cleared by the [place] FDA and has  been authorized for detection and/or diagnosis of SARS-CoV-2 by FDA under an Emergency Use Authorization (EUA).  This EUA will remain in effect (meaning this test can be used) for the duration of the COVID-19 declaration under Section 564(b)(1) of the Act, 21 U.S.C. section 360bbb-3(b)(1), unless the authorization is terminated or revoked.     Resp Syncytial Virus by PCR NEGATIVE NEGATIVE Final    Comment: (NOTE) Fact Sheet for Patients: EntrepreneurPulse.com.au  Fact Sheet for Healthcare Providers: IncredibleEmployment.be  This test is not yet approved or cleared by the [place] FDA and has been authorized for detection and/or diagnosis of SARS-CoV-2 by FDA under an Emergency Use Authorization (EUA). This EUA will remain in effect (meaning this test can be used) for the duration of the COVID-19 declaration under Section 564(b)(1) of the Act, 21 U.S.C. section 360bbb-3(b)(1), unless the authorization is terminated or revoked.  Performed at [facility], [place] [address] [place], [place] [address]   Blood culture (routine x 2)     Status: None   Collection Time: 05/26/22  2:06 PM   Specimen: BLOOD RIGHT HAND  Result Value Ref Range Status   Specimen Description BLOOD RIGHT HAND  Final   Special Requests   Final    BOTTLES DRAWN AEROBIC AND ANAEROBIC Blood Culture adequate volume   Culture   Final    NO GROWTH 5 DAYS Performed at [facility], [place] [address], [place], [place] [address]    Report Status 05/31/2022 FINAL  Final  Blood culture (routine x 2)     Status: None   Collection Time: 05/26/22  6:05 PM   Specimen: BLOOD  Result Value Ref Range Status   Specimen Description BLOOD BLOOD RIGHT HAND  Final   Special Requests   Final    BOTTLES DRAWN AEROBIC AND ANAEROBIC Blood Culture adequate volume   Culture   Final    NO GROWTH 5 DAYS Performed at [facility], [place] [address], [place], [place] [address]     Report Status 05/31/2022 FINAL  Final  Urine Culture (for pregnant, neutropenic or urologic patients or patients with an indwelling urinary catheter)     Status: Abnormal   Collection Time: 05/27/22  9:25 AM   Specimen: Urine, Clean Catch  Result Value Ref Range Status   Specimen Description URINE, CLEAN CATCH  Final   Special Requests NONE  Final   Culture (A)  Final    >=100,000 COLONIES/mL KLEBSIELLA PNEUMONIAE Two isolates with different morphologies were identified as the same organism.The most resistant organism was reported. Performed at [facility], [place] [address] [place], [place] [address]    Report Status 05/29/2022 FINAL  Final   Organism ID, Bacteria KLEBSIELLA PNEUMONIAE (A)  Final      Susceptibility   Klebsiella pneumoniae - MIC*    AMPICILLIN >=32 RESISTANT Resistant     CEFAZOLIN <=4 SENSITIVE Sensitive     CEFEPIME <=0.12 SENSITIVE Sensitive     CEFTRIAXONE <=0.25 SENSITIVE Sensitive     CIPROFLOXACIN <=0.25 SENSITIVE Sensitive     GENTAMICIN <=1 SENSITIVE Sensitive     IMIPENEM <=0.25 SENSITIVE Sensitive     NITROFURANTOIN 64 INTERMEDIATE Intermediate     TRIMETH/SULFA <=20 SENSITIVE Sensitive     AMPICILLIN/SULBACTAM 4 SENSITIVE Sensitive     PIP/TAZO <=4 SENSITIVE Sensitive     * >=100,000 COLONIES/mL KLEBSIELLA PNEUMONIAE      Radiology Studies: No results found.     LOS: 4 days   Cut[place] [employer] on [email or web address]  05/31/2022, 8:55 AM

## 2022-05-31 NOTE — Progress Notes (Signed)
Daily Progress Note   Patient Name: Charlene Houston       Date: 05/31/2022 DOB: 11-09-1945  Age: 77 y.o. MRN#: [id number] Attending Physician: Bonnielee Haff, MD Primary Care Physician: Lilian Coma., MD Admit Date: 05/26/2022 Length of Stay: 4 days  Reason for Consultation/Follow-up: Establishing goals of care  HPI/Patient Profile:  77 y.o. female  with past medical history of HTN, DM, arthritis, stage IIIA right breast cancer in 2007 s/p lumpectomy, asthma,?  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 She presented to the ED for AMS and hyperglycemia and was admitted on 05/26/2022 with acute metabolic encephalopathy, UTI, chronic systolic CHF, dehydration/hyponatremia, uncontrolled diabetes type 2 with hyperglycemia, history of stage IV lung cancer, and others.    She is followed at [facility] for her cancer and was treated with immunotherapy Beryle Flock) until July 2023 when she developed complications including pneumonitis prompting discontinuation of treatment and gradual decline since then    PMT was consulted for [place] conversations.  Subjective:   Subjective: Chart Reviewed. Updates received. Patient Assessed. Created space and opportunity for patient  and family to explore thoughts and feelings regarding current medical situation.  Today's Discussion: I saw the patient this morning.  She is much more awake and her mentation is definitely improved.  She is oriented x 3.  She states that her bottom hurts but when I offered to get the nurse to assist me in getting her up off her bottom she declined.  She denies any other pain, nausea, vomiting.  We discussed that the plan is for her to discharge to skilled nursing facility to try to get stronger and continue getting better.  She gave me permission to call and update her daughter.  When I called later in  the day to update her daughter, her daughter was at the bedside.  So I am back to bedside to speak with the patient and her daughter.  They were having a pleasant conversation.  Her daughter is very happy and that her mother has improved significantly.  Her mentation is improved and she is eating well.  We discussed the plan to discharge to [facility] for rehab.  She is on board with this.  Anticipated discharge date is tomorrow.  I discussed consulting TOC for an outpatient palliative care referral.  I explained outpatient palliative care can assist patients with severe illness to help with symptom management and support as well as guiding discussions as care moves forward and clinical status evolves.  She is agreeable to this.  TOC consult was placed.  I provided emotional and general support through therapeutic listening, empathy, sharing of stories, therapeutic touch, and other techniques. I answered all questions and addressed all concerns to the best of my ability.  Review of Systems  Respiratory:  Negative for cough and shortness of breath.   Cardiovascular:  Negative for chest pain.  Gastrointestinal:  Negative for abdominal pain, nausea and vomiting.  Musculoskeletal:        Pain "on my bottom"    Objective:   Vital Signs:  BP 101/61 (BP Location: Left Arm)   Pulse 78   Temp 97.7 F (36.5 C) (Oral)   Resp 18   Ht 5\' 5"  (1.651 m)   Wt 90.4 kg   SpO2 100%   BMI 33.16 kg/m   Physical Exam: Physical Exam Vitals and nursing note reviewed.  Constitutional:      General: She is not in acute distress.    Appearance: She is ill-appearing.  HENT:     Head: Normocephalic and atraumatic.  Cardiovascular:     Rate and Rhythm: Normal rate.  Pulmonary:     Effort: Pulmonary effort is normal. No respiratory distress.     Breath sounds: No wheezing or rhonchi.  Abdominal:     General: Bowel sounds are normal. There is no distension.     Palpations: Abdomen is soft.  Skin:     General: Skin is warm and dry.  Neurological:     Mental Status: She is alert and oriented to person, place, and time.  Psychiatric:        Mood and Affect: Mood normal.        Behavior: Behavior normal.     Palliative Assessment/Data: 30%    Existing Vynca/ACP Documentation: None  Assessment & Plan:   Impression: Present on Admission:  Acute metabolic encephalopathy  Essential hypertension  Sleep apnea  Non-small cell lung cancer ([place])  Acute combined systolic and diastolic congestive heart failure ([place])  SUMMARY OF RECOMMENDATIONS   DNR/DNI Continue current scope of treatment Anticipate discharge tomorrow to [facility] consult for referral to outpatient palliative care Continued emotional support of patient and family PMT will back off for now Please notify us if significant clinical change or new palliative needs  Symptom Management:  Per primary team PMT is available to assist as needed  Code Status: DNR  Prognosis: Unable to determine  Discharge Planning: [facility] for rehab with Palliative care service follow-up  Discussed with: Patient, patient's family, medical team, nursing team  Thank you for allowing [place] to participate in the care of Linley Moskal PMT will continue to support holistically.  Billing based on MDM: High  Problems Addressed: One acute or chronic illness or injury that poses a threat to life or bodily function  Amount and/or Complexity of Data: Category 3:Discussion of management or test interpretation with external physician/other qualified health care professional/appropriate source (not separately reported)  Risks: Decision not to resuscitate or to de-escalate care because of poor prognosis  Walden Field, NP Palliative Medicine Team  Team Phone # [phone] (Nights/Weekends)  12/09/2020, 8:17 AM

## 2022-05-31 NOTE — Inpatient Diabetes Management (Signed)
Inpatient Diabetes Program Recommendations  AACE/ADA: New Consensus Statement on Inpatient Glycemic Control (2015)  Target Ranges:  Prepandial:   less than 140 mg/dL      Peak postprandial:   less than 180 mg/dL (1-2 hours)      Critically ill patients:  140 - 180 mg/dL   Lab Results  Component Value Date   GLUCAP 248 (H) 05/31/2022   HGBA1C 8.3 (H) 05/26/2022    Review of Glycemic Control  Latest Reference Range & Units 05/30/22 21:27 05/31/22 06:10 05/31/22 09:00  Glucose-Capillary 70 - 99 mg/dL 300 (H) 388 (H) 248 (H)  (H): Data is abnormally high Diabetes history: Type 2 DM Outpatient Diabetes medications: Amaryl 2 mg QD, Jardiance 10 mg QD Current orders for Inpatient glycemic control: Novolog 0-15 units TID, Semglee 12 units QHS Prednisone 5 mg QD  Inpatient Diabetes Program Recommendations:   If intake more consistent: Consider Novolog 3 units TID (Assuming patient consuming >50% of meals)  Thanks, Bronson Curb, MSN, RNC-OB Diabetes Coordinator [phone] (8a-5p)

## 2022-05-31 NOTE — Progress Notes (Signed)
Physical Therapy Treatment Patient Details Name: Charlene Houston MRN: [id number] DOB: [birth year] Today's Date: 05/31/2022   History of Present Illness Pt is a 77 y.o. female who presented 05/26/22 from SNF with AMS and hyperglycemia concerns. Admitted with acute metabolic encephalopathy and UTI. MRI of head showed no definite evidence of acute intracranial pathology or intracranial metastatic disease. PMH includes lung CA, breast CA, HTN, OSA, DM, arthritis, obesity, CHF, COPD.    PT Comments    Very limited treatment session. Upon entrance, pt alert and calm. However, upon initiation of conversation, pt becoming emotionally labile and upset, stating she didn't have her phone. Assisted with calling her family member, however, this did not seem to soothe pt. Attempted to progress to edge of bed, however, pt reporting increased pain in buttocks and request PT no longer touch her. Repositioned pt for pressure relief. Pt declining further exercises. Continue to recommend SNF for ongoing Physical Therapy.     Recommendations for follow up therapy are one component of a multi-disciplinary discharge planning process, led by the attending physician.  Recommendations may be updated based on patient status, additional functional criteria and insurance authorization.  Follow Up Recommendations  Skilled nursing-short term rehab (<3 hours/day) Can patient physically be transported by private vehicle: No   Assistance Recommended at Discharge Frequent or constant Supervision/Assistance  Patient can return home with the following Two people to help with walking and/or transfers;Two people to help with bathing/dressing/bathroom;Assistance with cooking/housework;Direct supervision/assist for medications management;Direct supervision/assist for financial management;Assist for transportation;Help with stairs or ramp for entrance   Equipment Recommendations  Other (comment) (defer)    Recommendations for  Other Services       Precautions / Restrictions Precautions Precautions: Fall Restrictions Weight Bearing Restrictions: No     Mobility  Bed Mobility Overal bed mobility: Needs Assistance Bed Mobility: Rolling Rolling: Mod assist         General bed mobility comments: Assisted BLE's to edge of bed, pt becoming agitated, stating," don't touch me anymore! help my legs back on the bed." ModA for rolling for repositioning    Transfers                   General transfer comment: pt declining    Ambulation/Gait                   Stairs             Wheelchair Mobility    Modified Rankin (Stroke Patients Only)       Balance                                            Cognition Arousal/Alertness: Awake/alert Behavior During Therapy: Flat affect Overall Cognitive Status: No family/caregiver present to determine baseline cognitive functioning                                 General Comments: Pt emotionally labile, upset because she didn't have her phone. Asking to speak to her family member; assisted with calling pt son.        Exercises      General Comments        Pertinent Vitals/Pain Pain Assessment Pain Assessment: Faces Faces Pain Scale: Hurts even more Pain Location: buttocks Pain Descriptors / Indicators: Discomfort, Crying Pain Intervention(s):  Limited activity within patient's tolerance, Monitored during session, Repositioned    Home Living                          Prior Function            PT Goals (current goals can now be found in the care plan section) Acute Rehab PT Goals Patient Stated Goal: did not state Potential to Achieve Goals: Fair    Frequency    Min 2X/week      PT Plan Current plan remains appropriate    Co-evaluation              AM-PAC PT "6 Clicks" Mobility   Outcome Measure  Help needed turning from your back to your side while in a flat  bed without using bedrails?: A Lot Help needed moving from lying on your back to sitting on the side of a flat bed without using bedrails?: Total Help needed moving to and from a bed to a chair (including a wheelchair)?: Total Help needed standing up from a chair using your arms (e.g., wheelchair or bedside chair)?: Total Help needed to walk in hospital room?: Total Help needed climbing 3-5 steps with a railing? : Total 6 Click Score: 7    End of Session   Activity Tolerance: Patient limited by pain Patient left: in bed;with call bell/phone within reach;with bed alarm set;with family/visitor present;with restraints reapplied Nurse Communication: Mobility status PT Visit Diagnosis: Unsteadiness on feet (R26.81);Other abnormalities of gait and mobility (R26.89);Muscle weakness (generalized) (M62.81);Difficulty in walking, not elsewhere classified (R26.2);Pain     Time: 8270-7867 PT Time Calculation (min) (ACUTE ONLY): 13 min  Charges:  $Therapeutic Activity: 8-22 mins                     Wyona Almas, PT, DPT Acute Rehabilitation Services Office [phone]    Deno Etienne 05/31/2022, 12:26 PM

## 2022-05-31 NOTE — Care Management Important Message (Signed)
Important Message  Patient Details  Name: Charlene Houston MRN: [id number] Date of Birth: Oct 03, 1945   Medicare Important Message Given:  Yes     Orbie Pyo 05/31/2022, 3:24 PM

## 2022-05-31 NOTE — Consult Note (Addendum)
[facility] Nurse Consult Note: Reason for Consult: multiple wounds buttocks/perineum  Wound type: Moisture Associated Skin Damage  ICD-10 CM Codes for Irritant Dermatitis  L24A2 - Due to fecal, urinary or dual incontinence  Pressure Injury POA: NA, not pressure related  Measurement:   Patient with multiple (6) scattered areas of full thickness skin loss to buttocks largest measuring 3 cms x 4 cms x 0.2 cms (R buttock) pink and moist  2.   2 areas full thickness at perineum 1 cm x 1 cm x 0.1 with yellow necrotic tissue  Drainage (amount, consistency, odor) serosanguinous, 2 areas of full thickness perineum yellow exudate  Periwound: moist Dressing procedure/placement/frequency:  Gerhardts Butt Cream to groin and perineum 3 times daily and prn soiling.  Xeroform to multiple areas of full thickness skin loss buttocks. May cover with foam to secure.   Areas of full thickness perineum place Medihoney on gauze and apply to necrotic wound bed, cover with foam dressing.  Change foam dressings q3 days and prn soiling.  Ordered low air mattress for patient for moisture management.  Areas in perineum very painful for patient, low air loss mattress may help with discomfort as well.    POC discussed with bedside nurse.  WOC will not follow at this time.  Re-consult if further needs arise.    Thank you,    Aunica Dauphinee MSN, RN-BC, [employer]

## 2022-06-01 DIAGNOSIS — G9341 Metabolic encephalopathy: Secondary | ICD-10-CM | POA: Diagnosis not present

## 2022-06-01 LAB — CBC
HCT: 34.9 % — ABNORMAL LOW (ref 36.0–46.0)
Hemoglobin: 11.1 g/dL — ABNORMAL LOW (ref 12.0–15.0)
MCH: 31.4 pg (ref 26.0–34.0)
MCHC: 31.8 g/dL (ref 30.0–36.0)
MCV: 98.9 fL (ref 80.0–100.0)
Platelets: 90 10*3/uL — ABNORMAL LOW (ref 150–400)
RBC: 3.53 MIL/uL — ABNORMAL LOW (ref 3.87–5.11)
RDW: 14.8 % (ref 11.5–15.5)
WBC: 10.1 10*3/uL (ref 4.0–10.5)
nRBC: 0.5 % — ABNORMAL HIGH (ref 0.0–0.2)

## 2022-06-01 LAB — GLUCOSE, CAPILLARY
Glucose-Capillary: 79 mg/dL (ref 70–99)
Glucose-Capillary: 113 mg/dL — ABNORMAL HIGH (ref 70–99)

## 2022-06-01 MED ORDER — PREDNISONE 5 MG PO TABS: 5.0000 mg | ORAL_TABLET | Freq: Every day | ORAL | Status: DC

## 2022-06-01 MED ORDER — TRAMADOL HCL 50 MG PO TABS: 50.0000 mg | ORAL_TABLET | Freq: Two times a day (BID) | ORAL | 0 refills | Status: DC | PRN

## 2022-06-01 MED ORDER — MEDIHONEY WOUND/BURN DRESSING EX PSTE: 1.0000 | PASTE | Freq: Every day | CUTANEOUS | Status: DC

## 2022-06-01 MED ORDER — GERHARDT'S BUTT CREAM: 1.0000 | TOPICAL_CREAM | Freq: Three times a day (TID) | CUTANEOUS | Status: DC

## 2022-06-01 NOTE — Plan of Care (Signed)
  Problem: Education: Goal: Ability to describe self-care measures that may prevent or decrease complications (Diabetes Survival Skills Education) will improve Outcome: Progressing   Problem: Coping: Goal: Ability to adjust to condition or change in health will improve Outcome: Progressing   Problem: Education: Goal: Knowledge of General Education information will improve Description: Including pain rating scale, medication(s)/side effects and non-pharmacologic comfort measures Outcome: Progressing   Problem: Clinical Measurements: Goal: Will remain free from infection Outcome: Progressing   Problem: Skin Integrity: Goal: Risk for impaired skin integrity will decrease Outcome: Not Progressing

## 2022-06-01 NOTE — Progress Notes (Signed)
Speech Language Pathology Treatment: Dysphagia  Patient Details Name: Charlene Houston MRN: [id number] DOB: November 23, 1945 Today's Date: 06/01/2022 Time: 1048-1100 SLP Time Calculation (min) (ACUTE ONLY): 12 min  Assessment / Plan / Recommendation Clinical Impression  Pt seen for safety with po's and ability to upgrade from puree. Alert and responsive to therapists questions with delays. Upper dentures present (states she uses when eating), cleaned and donned however they were ill fitting and continued to fall from her hard palate. Pt states she does not use denture cream and they were removed from oral cavity. There was mildly prolonged mastication with graham cracker solid and she was able to clear completely. No signs of aspiration with straw sips thin liquid with eructation noted. SLP upgraded texture to Dys 2 (minced), continue thin and ST will continue to follow for safety and potential upgrade.    HPI HPI: Pt is a 77 y.o. female who presented secondary to AMS and hyperglycemia concerns. MRI brain negative for acute changes. CXR 2/14: Improved left base aeration with minimal airspace disease remaining, favoring atelectasis. Pt found to have UTI. 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      SLP Plan  Continue with current plan of care      Recommendations for follow up therapy are one component of a multi-disciplinary discharge planning process, led by the attending physician.  Recommendations may be updated based on patient status, additional functional criteria and insurance authorization.    Recommendations  Diet recommendations: Dysphagia 2 (fine chop);Thin liquid Liquids provided via: Cup;Straw Medication Administration: Whole meds with puree Supervision:  Staff to assist with self feeding;Full supervision/cueing for compensatory strategies Compensations: Slow rate;Small sips/bites Postural Changes and/or Swallow Maneuvers: Seated upright 90 degrees                Oral Care Recommendations: Oral care BID Follow Up Recommendations: Skilled nursing-short term rehab (<3 hours/day) Assistance recommended at discharge: Frequent or constant Supervision/Assistance SLP Visit Diagnosis: Dysphagia, unspecified (R13.10) Plan: Continue with current plan of care           Houston Siren  06/01/2022, 11:28 AM

## 2022-06-01 NOTE — Discharge Summary (Addendum)
[facility]  Physician Discharge Summary   Patient ID: Rethel Sebek MRN: [id number] DOB/AGE: 1946/01/05 77 y.o.  Admit date: 05/26/2022 Discharge date:   06/01/2022   PCP: Lilian Coma., MD  DISCHARGE DIAGNOSES:    Acute metabolic encephalopathy UTI with klebsiella   Essential hypertension   Sleep apnea   Type 2 diabetes mellitus (HCC)   Non-small cell lung cancer ([place])   Acute combined systolic and diastolic congestive heart failure (HCC)   Drug-induced pneumonitis   RECOMMENDATIONS FOR OUTPATIENT FOLLOW UP: Check CBC and basic metabolic panel in 2 to 3 days Speech therapy to continue to follow Monitor CBGs and consider reinitiating basal insulin depending on glucose levels.  Home Health: SNF Equipment/Devices: None  CODE STATUS: DNR  DISCHARGE CONDITION: fair  Diet recommendation: Dysphagia 2 diet with thin liquids  INITIAL HISTORY: 77 y.o. female with history h/o HTN, DM, arthritis, stage IIIA right breast cancer in 2007 s/p lumpectomy, asthma,?  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  According to the admitting providers conversation with daughter patient has been declining even her mental status over the last 6 to 8 months.  Patient was found to have abnormal UA.  She was hospitalized for further management.     Consultants: Neurology.  Palliative medicine   Procedures: Continuous EEG   HOSPITAL COURSE:   Acute metabolic encephalopathy Reason for her mental status change is unclear but probably  due to UTI.  However daughter also mentioned that she has had a decline over the past few months. CT head did not show any acute findings.  MRI brain was motion degraded but did not show any acute findings.  No masses or lesions were identified.  Patient does not have any focal neurological deficits.   There was also concern for seizure activity.  EEG was done which did not show any epileptiform or seizure activity.  Neurology was subsequently consulted.  Patient placed on long-term EEG.  No epileptiform or seizure activity noted.  She is noted to have B12 deficiency based on level of 124 back in 2022.  Supposed to be on B12 injections every month.  B12 level was checked and noted to be 836.  Folic acid level is 83.0.  TSH is normal at 0.42.  Ammonia level noted to be mildly elevated at 43.  Likely of no clinical significance. Mentation has improved in the last 2 to 3 days.  Seems to be close to baseline now.  So it is quite possible that it was indeed a UTI that caused her encephalopathy. She seems to be tolerating her diet well.   Neurology has signed off.  She is off of continuous EEG.  Neurology to arrange outpatient f/u.   Urinary tract infection with Klebsiella Urine culture positive for Klebsiella.  Sensitivities reviewed.  She was given a 5-day course of ceftriaxone.  WBC is normal today.  She is afebrile.   Chronic systolic CHF EF is known to be about 35%.  Followed by heart failure clinic.  Furosemide was held due to hyponatremia and concern for significant dehydration and hypovolemia.  Spironolactone also on hold.  Home medication  list also shows that she is supposed to be on carvedilol and Jardiance. Had to be given IV fluids due to dehydration.  Resume home medications.   Dehydration/hypernatremia/hypokalemia Likely due to poor oral intake.  Diuretics were held.  She was given IV fluids.  Improved. Electrolytes are stable.    Diabetes mellitus type 2, uncontrolled with  hyperglycemia Likely due to steroid use.  HbA1c found to be 8.3. She is noted to be on Jardiance prior to admission.  Also on glimepiride Started on glargine here in the hospital.  May resume her home medications at discharge.  Monitor CBGs and consider long-acting insulin depending on CBG values.   History of stage IV lung cancer Followed by oncology in [facility].  According to oncology notes patient had reassuring PET scan in December 2023.  She has been off of Keytruda since last summer due to complications of pneumonitis.   Has been on prednisone for same and has been tapered down gradually.  Reduced to 5 mg.   Previous history of breast cancer She is status postlumpectomy and radiation treatment in 2007.   Leukocytosis Likely multifactorial including infection and chronic steroid use.  WBC is proved and is noted to be normal today.   Vitamin B12 deficiency Supposed to get vitamin B12 injections on a monthly basis. Repeat B12 level is 836.   Macrocytic anemia/thrombocytopenia Drop in hemoglobin is slightly low.  No evidence of overt bleeding. Low platelet counts noted.  Probably multifactorial including infection and malignancy.  Would recommend this be rechecked in a few days.  She also received Lovenox here in the hospital but held today.   History of asthma, chronic respiratory failure with hypoxia Uses 2 L of oxygen by nasal cannula prior to admission.   Obesity Estimated body mass index is 33.16 kg/m as calculated from the following:   Height as of this encounter: 5\' 5"  (1.651 m).   Weight as of this encounter: 90.4 kg.   Pressure Injury 04/23/22 Buttocks Bilateral Stage 3 -  Full thickness tissue loss. Subcutaneous fat may be visible but bone, tendon or muscle are NOT exposed. (Active)  04/23/22 1000  Location: Buttocks  Location Orientation: Bilateral  Staging: Stage 3 -  Full thickness tissue loss. Subcutaneous fat may be visible but bone, tendon or muscle are NOT  exposed.  Wound Description (Comments):   Present on Admission: Yes    Patient is stable.  Okay for discharge back to SNF today.   PERTINENT LABS:  The results of significant diagnostics from this hospitalization (including imaging, microbiology, ancillary and laboratory) are listed below for reference.    Microbiology: Recent Results (from the past 240 hour(s))  Resp panel by RT-PCR (RSV, Flu A&B, Covid) Anterior Nasal Swab     Status: None   Collection Time: 05/26/22  9:17 AM   Specimen: Anterior Nasal Swab  Result Value Ref Range Status   SARS Coronavirus 2 by RT PCR NEGATIVE NEGATIVE Final   Influenza A by PCR NEGATIVE NEGATIVE Final   Influenza B by PCR NEGATIVE NEGATIVE Final    Comment: (NOTE) The Xpert Xpress SARS-CoV-2/FLU/RSV plus assay is intended as an aid in the diagnosis of influenza from Nasopharyngeal swab specimens and should not be used as a sole basis for treatment. Nasal washings and aspirates are unacceptable for Xpert Xpress SARS-CoV-2/FLU/RSV testing.  Fact Sheet for Patients: EntrepreneurPulse.com.au  Fact Sheet for Healthcare Providers: IncredibleEmployment.be  This test is not yet approved or cleared by the [place] FDA and has  been authorized for detection and/or diagnosis of SARS-CoV-2 by FDA under an Emergency Use Authorization (EUA). This EUA will remain in effect (meaning this test can be used) for the duration of the COVID-19 declaration under Section 564(b)(1) of the Act, 21 U.S.C. section 360bbb-3(b)(1), unless the authorization is terminated or revoked.     Resp Syncytial Virus by PCR NEGATIVE NEGATIVE Final    Comment: (NOTE) Fact Sheet for Patients: EntrepreneurPulse.com.au  Fact Sheet for Healthcare Providers: IncredibleEmployment.be  This test is not yet approved or cleared by the [place] FDA and has been authorized for detection and/or diagnosis  of SARS-CoV-2 by FDA under an Emergency Use Authorization (EUA). This EUA will remain in effect (meaning this test can be used) for the duration of the COVID-19 declaration under Section 564(b)(1) of the Act, 21 U.S.C. section 360bbb-3(b)(1), unless the authorization is terminated or revoked.  Performed at [facility], [place] [address], [place], [place] [address]   Blood culture (routine x 2)     Status: None   Collection Time: 05/26/22  2:06 PM   Specimen: BLOOD RIGHT HAND  Result Value Ref Range Status   Specimen Description BLOOD RIGHT HAND  Final   Special Requests   Final    BOTTLES DRAWN AEROBIC AND ANAEROBIC Blood Culture adequate volume   Culture   Final    NO GROWTH 5 DAYS Performed at [facility], [place] [address] [place],  [place] [address]    Report Status 05/31/2022 FINAL  Final  Blood culture (routine x 2)     Status: None   Collection Time: 05/26/22  6:05 PM   Specimen: BLOOD  Result Value Ref Range Status   Specimen Description BLOOD BLOOD RIGHT HAND  Final   Special Requests   Final    BOTTLES DRAWN AEROBIC AND ANAEROBIC Blood Culture adequate volume   Culture   Final    NO GROWTH 5 DAYS Performed at [facility], [place] [address] [place], [place] [address]    Report Status 05/31/2022 FINAL  Final  Urine Culture (for pregnant, neutropenic or urologic patients or patients with an indwelling urinary catheter)     Status: Abnormal   Collection Time: 05/27/22  9:25 AM   Specimen: Urine, Clean Catch  Result Value Ref Range Status   Specimen Description URINE, CLEAN CATCH  Final   Special Requests NONE  Final   Culture (A)  Final    >=100,000 COLONIES/mL KLEBSIELLA PNEUMONIAE Two isolates with different morphologies were identified as the same organism.The most resistant organism was reported. Performed at [facility], [place] [address] [place], [place] [address]    Report Status 05/29/2022 FINAL  Final   Organism ID, Bacteria KLEBSIELLA  PNEUMONIAE (A)  Final      Susceptibility   Klebsiella pneumoniae - MIC*    AMPICILLIN >=32 RESISTANT Resistant     CEFAZOLIN <=4 SENSITIVE Sensitive     CEFEPIME <=0.12 SENSITIVE Sensitive     CEFTRIAXONE <=0.25 SENSITIVE Sensitive     CIPROFLOXACIN <=0.25 SENSITIVE Sensitive     GENTAMICIN <=1 SENSITIVE Sensitive     IMIPENEM <=0.25 SENSITIVE Sensitive     NITROFURANTOIN 64 INTERMEDIATE Intermediate     TRIMETH/SULFA <=20 SENSITIVE Sensitive     AMPICILLIN/SULBACTAM 4 SENSITIVE Sensitive     PIP/TAZO <=4 SENSITIVE Sensitive     * >=100,000 COLONIES/mL KLEBSIELLA PNEUMONIAE     Labs:   Basic Metabolic Panel: Recent Labs  Lab 05/27/22 0108 05/28/22 9833  05/29/22 0438 05/30/22 0344 05/31/22 0401  NA 151* 142 141 138 137  K 4.5 3.6 3.4* 4.2 3.8  CL 96* 92* 94* 95* 92*  CO2 30 38* 36* 33* 34*  GLUCOSE 198* 195* 73 152* 283*  BUN 19 16 20 19 16   CREATININE 1.17* 0.89 0.89 0.71 0.93  CALCIUM 9.0 8.6* 9.0 8.5* 8.5*  MG  --   --   --  1.7  --    Liver Function Tests: Recent Labs  Lab 05/26/22 0940  AST 22  ALT 16  ALKPHOS 93  BILITOT 1.2  PROT 5.3*  ALBUMIN 2.4*   Recent Labs  Lab 05/26/22 0940  LIPASE 41   Recent Labs  Lab 05/29/22 0438  AMMONIA 43*   CBC: Recent Labs  Lab 05/26/22 0940 05/26/22 0957 05/26/22 1804 05/27/22 0108 05/28/22 0649 05/29/22 0438 06/01/22 0351  WBC 14.1*  --  15.4* 16.1* 13.9* 13.9* 10.1  NEUTROABS 11.9*  --   --   --   --   --   --   HGB 11.6*   < > 12.2 12.2 10.6* 10.6* 11.1*  HCT 37.5   < > 39.9 38.6 34.8* 34.6* 34.9*  MCV 100.0  --  101.8* 98.5 101.8* 101.5* 98.9  PLT 148*  --  114* 118* 111* 112* 90*   < > = values in this interval not displayed.    CBG: Recent Labs  Lab 05/31/22 1201 05/31/22 1605 05/31/22 2105 05/31/22 2220 06/01/22 0608  GLUCAP 341* 208* 70 146* 79     IMAGING STUDIES Overnight EEG with video  Result Date: 05/28/2022 Lora Havens, MD     05/29/2022  9:16 AM Patient Name:  Juliza Machnik MRN: [id number] Epilepsy Attending: Lora Havens Referring Physician/Provider: Greta Doom, MD Duration: 05/27/2022 2042 to to 05/28/2022 2042 Patient history: 77yo F with ams. EEG to evaluate for seizure  Level of alertness: Awake  AEDs during EEG study: None  Technical aspects: This EEG study was done with scalp electrodes positioned according to the 10-20 International system of electrode placement. Electrical activity was reviewed with band pass filter of 1-70Hz , sensitivity of 7 uV/mm, display speed of 14mm/sec with a 60Hz  notched filter applied as appropriate. EEG data were recorded continuously and digitally stored.  Video monitoring was available and reviewed as appropriate.  Description: EEG showed continuous generalized 3 to 6 Hz theta-delta slowing. Patient was noted to have right arm tremor on 05/28/2022 at 1319. Concomitant eeg before, during and after the event didn't show any eeg change.  Hyperventilation and photic stimulation were not performed.  Of note, study was technically difficult due to significant electrode artifact.  ABNORMALITY - Continuous slow, generalized  IMPRESSION: This technically difficult study is suggestive of moderate diffuse encephalopathy, nonspecific etiology. No seizures or epileptiform discharges were seen throughout the recording. Patient was noted to have right arm tremor on 05/28/2022 at 1319 without concomitant eeg change. The semiology and lack of eeg change suggests it was most likely not an epileptic event. However, focal motor seizures amy not be seen on scalp eeg. Clinical correlation is recommended.  Lora Havens   EEG adult  Result Date: 05/27/2022 Lora Havens, MD     05/27/2022  4:18 PM Patient Name: Oriya Kettering MRN: [id number] Epilepsy Attending: Lora Havens Referring Physician/Provider: Bonnielee Haff, MD Date: 05/27/2022 Duration: 26.10 mins Patient history: 77yo F with ams. EEG to evaluate for  seizure Level of alertness: Awake AEDs during EEG  study: None Technical aspects: This EEG study was done with scalp electrodes positioned according to the 10-20 International system of electrode placement. Electrical activity was reviewed with band pass filter of 1-70Hz , sensitivity of 7 uV/mm, display speed of 53mm/sec with a 60Hz  notched filter applied as appropriate. EEG data were recorded continuously and digitally stored.  Video monitoring was available and reviewed as appropriate. Description: EEG showed continuous generalized 3 to 6 Hz theta-delta slowing.  Hyperventilation and photic stimulation were not performed.  Of note, study was technically difficult due to significant electrode and movement artifact.  ABNORMALITY - Continuous slow, generalized IMPRESSION: This technically difficult study is suggestive of moderate diffuse encephalopathy, nonspecific etiology. No seizures or epileptiform discharges were seen throughout the recording. Lora Havens   MR BRAIN WO CONTRAST  Result Date: 05/27/2022 CLINICAL DATA:  Altered mental status. History of lung cancer and breast cancer. EXAM: MRI HEAD WITHOUT CONTRAST TECHNIQUE: Multiplanar, multiecho pulse sequences of the brain and surrounding structures were obtained without intravenous contrast. COMPARISON:  CT head 05/26/2022, brain MRI 06/09/2021 FINDINGS: Image quality is motion degraded. Additionally, evaluation for small metastatic lesions is limited in the absence of intravenous contrast. Brain: There is no acute intracranial hemorrhage, extra-axial fluid collection, or acute infarct. There is mild parenchymal volume loss with prominence of the ventricular system and extra-axial CSF spaces, within expected limits for age. The ventricles are normal in size. Parenchymal signal is normal for age, with minimal periventricular FLAIR signal abnormality. The pituitary and suprasellar region are normal. No definite mass lesion is seen to suggest  intracranial metastatic disease. There is no mass effect or midline shift. Vascular: Normal flow voids. Skull and upper cervical spine: Normal marrow signal. Sinuses/Orbits: The paranasal sinuses are clear. Bilateral lens implants are in place. The globes and orbits are otherwise unremarkable. Other: A left mastoid effusion is again seen. The imaged nasopharynx is unremarkable. IMPRESSION: No definite evidence of acute intracranial pathology or intracranial metastatic disease, within the confines of motion degraded images and noncontrast technique. Electronically Signed   By: Valetta Mole M.D.   On: 05/27/2022 09:52   CT HEAD WO CONTRAST (5MM)  Result Date: 05/26/2022 CLINICAL DATA:  Head trauma. Mental status change urinary tract infection. EXAM: CT HEAD WITHOUT CONTRAST TECHNIQUE: Contiguous axial images were obtained from the base of the skull through the vertex without intravenous contrast. RADIATION DOSE REDUCTION: This exam was performed according to the departmental dose-optimization program which includes automated exposure control, adjustment of the mA and/or kV according to patient size and/or use of iterative reconstruction technique. COMPARISON:  CT head dated April 22, 2022 FINDINGS: Brain: No evidence of acute infarction, hemorrhage, hydrocephalus, extra-axial collection or mass lesion/mass effect. Mild generalized cerebral atrophy and chronic microvascular ischemic changes of the white matter, unchanged. Vascular: No hyperdense vessel or unexpected calcification. Skull: Normal. Negative for fracture or focal lesion. Sinuses/Orbits: No acute finding. Other: None. IMPRESSION: 1. No acute intracranial abnormality. 2. Mild generalized cerebral atrophy and chronic microvascular ischemic changes of the white matter, unchanged. Electronically Signed   By: Keane Police D.O.   On: 05/26/2022 13:28   DG Chest Portable 1 View  Result Date: 05/26/2022 CLINICAL DATA:  Altered mental status. Urinary tract  infection. Diabetes. EXAM: PORTABLE CHEST 1 VIEW COMPARISON:  04/23/2022 FINDINGS: Right-sided Port-A-Cath tip at low right atrium. Numerous leads and wires project over the chest. Right breast and axillary surgical clips. Midline trachea. Mild cardiomegaly. No pleural effusion or pneumothorax. No congestive failure. Low lung volumes.  Improved left base aeration with mild airspace disease remaining. IMPRESSION: Cardiomegaly and low lung volumes. Improved left base aeration with minimal airspace disease remaining, favoring atelectasis. Right Port-A-Cath tip at low right atrium, as before. Electronically Signed   By: Abigail Miyamoto M.D.   On: 05/26/2022 09:35    DISCHARGE EXAMINATION: Vitals:   05/31/22 1956 06/01/22 0010 06/01/22 0356 06/01/22 0749  BP: 116/75 (!) 145/105 109/76 119/75  Pulse: 84 85 73 72  Resp: 17 17 16 16   Temp: 97.8 F (36.6 C) 97.9 F (36.6 C) (!) 97.5 F (36.4 C) (!) 97.5 F (36.4 C)  TempSrc: Oral Oral Oral Oral  SpO2: 100% 94% 100% 100%  Weight:      Height:       General appearance: Awake alert.  In no distress Resp: Clear to auscultation bilaterally.  Normal effort Cardio: S1-S2 is normal regular.  No S3-S4.  No rubs murmurs or bruit GI: Abdomen is soft.  Nontender nondistended.  Bowel sounds are present normal.  No masses organomegaly   DISPOSITION: SNF  Discharge Instructions     Call MD for:  difficulty breathing, headache or visual disturbances   Complete by: As directed    Call MD for:  extreme fatigue   Complete by: As directed    Call MD for:  persistant dizziness or light-headedness   Complete by: As directed    Call MD for:  persistant nausea and vomiting   Complete by: As directed    Call MD for:  severe uncontrolled pain   Complete by: As directed    Call MD for:  temperature >100.4   Complete by: As directed    Diet - low sodium heart healthy   Complete by: As directed    Discharge instructions   Complete by: As directed    Please review  instructions on the discharge summary.  You were cared for by a hospitalist during your hospital stay. If you have any questions about your discharge medications or the care you received while you were in the hospital after you are discharged, you can call the unit and asked to speak with the hospitalist on call if the hospitalist that took care of you is not available. Once you are discharged, your primary care physician will handle any further medical issues. Please note that NO REFILLS for any discharge medications will be authorized once you are discharged, as it is imperative that you return to your primary care physician (or establish a relationship with a primary care physician if you do not have one) for your aftercare needs so that they can reassess your need for medications and monitor your lab values. If you do not have a primary care physician, you can call [phone] for a physician referral.   Discharge wound care:   Complete by: As directed    Wound care  Daily      Comments: Clean buttocks with soap and water, apply Xeroform Kellie Simmering [phone]) to multiple areas of full thickness skin loss buttocks. May cover with foam to secure.  Change foam dressing q3 days and prn soiling.  Wound care  Daily      Comments: Clean areas of full thickness perineum with soap and water, dry, place Medihoney on gauze and apply to necrotic wound bed daily, cover with foam dressing.  Change foam dressings q3 days and prn soiling   Increase activity slowly   Complete by: As directed           Allergies  as of 06/01/2022       Reactions   Prozac [fluoxetine Hcl] Other (See Comments)   Jitters   Safflower Oil Other (See Comments)   Unknown reaction Documented on MAR        Medication List     STOP taking these medications    nitrofurantoin (macrocrystal-monohydrate) 100 MG capsule Commonly known as: MACROBID       TAKE these medications    acetaminophen 650 MG CR tablet Commonly known as:  TYLENOL Take 1 tablet (650 mg total) by mouth every 8 (eight) hours as needed for pain.   albuterol 1.25 MG/3ML nebulizer solution Commonly known as: ACCUNEB Take 1 ampule by nebulization every 6 (six) hours as needed for wheezing.   barrier cream Crea Commonly known as: non-specified Apply 1 Application topically See admin instructions. Apply topically to sacral area twice daily and with each incontinent episode. Zinc Barrier Cream.   bisacodyl 10 MG suppository Commonly known as: DULCOLAX Place 10 mg rectally daily as needed (constipation not relived by Milk of Magnesia).   carboxymethylcellulose 0.5 % Soln Commonly known as: REFRESH PLUS Place 1 drop into both eyes daily as needed (dry eyes).   carvedilol 3.125 MG tablet Commonly known as: COREG Take 1 tablet (3.125 mg total) by mouth 2 (two) times daily.   cyanocobalamin 1000 MCG/ML injection Commonly known as: VITAMIN B12 Inject 1,000 mcg into the muscle every 30 (thirty) days.   empagliflozin 10 MG Tabs tablet Commonly known as: JARDIANCE Take 1 tablet (10 mg total) by mouth daily.   escitalopram 5 MG tablet Commonly known as: LEXAPRO Take 5 mg by mouth daily.   folic acid 1 MG tablet Commonly known as: FOLVITE Take 1 mg by mouth daily.   furosemide 40 MG tablet Commonly known as: LASIX Take 1 tablet (40 mg total) by mouth 2 (two) times daily.   Gerhardt's butt cream Crea Apply 1 Application topically 3 (three) times daily. Apply to groin, upper thighs and perineum 3 times daily and prn soiling.   glimepiride 2 MG tablet Commonly known as: AMARYL Take 2 mg by mouth daily.   Glucerna Liqd Take 1 Can by mouth every evening.   insulin aspart 100 UNIT/ML injection Commonly known as: novoLOG Inject 5 Units into the skin once.   leptospermum manuka honey Pste paste Apply 1 Application topically daily.   MILK OF MAGNESIA PO Take 30 mLs by mouth daily as needed (constipation).   montelukast 10 MG  tablet Commonly known as: SINGULAIR Take 10 mg by mouth daily at 12 noon.   NON FORMULARY 1 Device by Other route at bedtime. BIPAP.   OXYGEN Inhale 2 L/min into the lungs continuous.   potassium chloride SA 20 MEQ tablet Commonly known as: KLOR-CON M Take 1 tablet (20 mEq total) by mouth daily.   predniSONE 5 MG tablet Commonly known as: DELTASONE Take 1 tablet (5 mg total) by mouth daily with breakfast. What changed:  medication strength how much to take   PRESCRIPTION MEDICATION 1 Dose See admin instructions. Potassium Acetate 40 mEq/20 mL VL. Give 3 times daily for 1 week.   Pro-Stat Liqd Take 30 mLs by mouth 2 (two) times daily.   RA SALINE ENEMA RE Place 1 enema rectally daily as needed (constipation not relived by bisacodyl suppository.).   simvastatin 40 MG tablet Commonly known as: ZOCOR Take 40 mg by mouth at bedtime.   spironolactone 25 MG tablet Commonly known as: ALDACTONE Take 1 tablet (25 mg  total) by mouth daily.   traMADol 50 MG tablet Commonly known as: ULTRAM Take 1 tablet (50 mg total) by mouth every 12 (twelve) hours as needed for severe pain.   TUCKS EX Apply 1 application  topically as needed (itching). Tucks 50% Medicated Cool Pads   Vitamin D2 10 MCG (400 UNIT) Tabs Take 800-1,200 Units by mouth See admin instructions. 2 entries on MAR: 1200 units once daily on Monday - Saturday + 800 units once daily on Sunday               Discharge Care Instructions  (From admission, onward)           Start     Ordered   06/01/22 0000  Discharge wound care:       Comments: Wound care  Daily      Comments: Clean buttocks with soap and water, apply Xeroform Kellie Simmering [phone]) to multiple areas of full thickness skin loss buttocks. May cover with foam to secure.  Change foam dressing q3 days and prn soiling.  Wound care  Daily      Comments: Clean areas of full thickness perineum with soap and water, dry, place Medihoney on gauze and apply to  necrotic wound bed daily, cover with foam dressing.  Change foam dressings q3 days and prn soiling   06/01/22 0762              Follow-up Information     Lilian Coma., MD Follow up.   Specialty: Internal Medicine Why: post hospitalization follow up Contact information: [address] [place] [address] [phone]                 TOTAL DISCHARGE TIME: 26 mins  [place]  [facility] Pager on [email or web address]  06/01/2022, 10:13 AM

## 2022-06-01 NOTE — TOC Transition Note (Signed)
Transition of Care [facility]) - CM/SW Discharge Note   Patient Details  Name: Charlene Houston MRN: [id number] Date of Birth: 1946-01-08  Transition of Care [facility]) CM/SW Contact:  Geralynn Ochs, LCSW Phone Number: 06/01/2022, 1:18 PM   Clinical Narrative:   CSW submitted for insurance authorization request to try for rehab for patient, awaiting response. [facility] able to accept while authorization is pending. Daughter at bedside notified, in agreement. Transport arranged with [facility] for next available.  Nurse to call report to [phone]. Room 308    Final next level of care: Skilled Nursing Facility Barriers to Discharge: Barriers Resolved   Patient Goals and CMS Choice CMS Medicare.gov Compare Post Acute Care list provided to:: Patient Represenative (must comment) Choice offered to / list presented to : Adult Children  Discharge Placement                Patient chooses bed at: [facility] Patient to be transferred to facility by: [facility] Name of family member notified: Sonya Patient and family notified of of transfer: 06/01/22  Discharge Plan and Services Additional resources added to the After Visit Summary for       Post Acute Care Choice: [place]                               Social Determinants of Health (SDOH) Interventions SDOH Screenings   Food Insecurity: No Food Insecurity (01/04/2022)  Housing: Low Risk  (01/04/2022)  Transportation Needs: No Transportation Needs (01/04/2022)  Utilities: Not At Risk (01/04/2022)  Alcohol Screen: Low Risk  (01/04/2022)  Financial Resource Strain: Low Risk  (01/04/2022)  Tobacco Use: Medium Risk (05/26/2022)     Readmission Risk Interventions     No data to display

## 2022-06-01 NOTE — Plan of Care (Signed)
Report called to [facility] to the receiving RN  Problem: Education: Goal: Ability to describe self-care measures that may prevent or decrease complications (Diabetes Survival Skills Education) will improve Outcome: Adequate for Discharge Goal: Individualized Educational Video(s) Outcome: Adequate for Discharge   Problem: Coping: Goal: Ability to adjust to condition or change in health will improve Outcome: Adequate for Discharge   Problem: Fluid Volume: Goal: Ability to maintain a balanced intake and output will improve Outcome: Adequate for Discharge   Problem: Health Behavior/Discharge Planning: Goal: Ability to identify and utilize available resources and services will improve Outcome: Adequate for Discharge Goal: Ability to manage health-related needs will improve Outcome: Adequate for Discharge   Problem: Metabolic: Goal: Ability to maintain appropriate glucose levels will improve Outcome: Adequate for Discharge   Problem: Nutritional: Goal: Maintenance of adequate nutrition will improve Outcome: Adequate for Discharge Goal: Progress toward achieving an optimal weight will improve Outcome: Adequate for Discharge   Problem: Skin Integrity: Goal: Risk for impaired skin integrity will decrease Outcome: Adequate for Discharge   Problem: Tissue Perfusion: Goal: Adequacy of tissue perfusion will improve Outcome: Adequate for Discharge   Problem: Education: Goal: Knowledge of General Education information will improve Description: Including pain rating scale, medication(s)/side effects and non-pharmacologic comfort measures Outcome: Adequate for Discharge   Problem: Health Behavior/Discharge Planning: Goal: Ability to manage health-related needs will improve Outcome: Adequate for Discharge   Problem: Clinical Measurements: Goal: Ability to maintain clinical measurements within normal limits will improve Outcome: Adequate for Discharge Goal: Will remain free from  infection Outcome: Adequate for Discharge Goal: Diagnostic test results will improve Outcome: Adequate for Discharge Goal: Respiratory complications will improve Outcome: Adequate for Discharge Goal: Cardiovascular complication will be avoided Outcome: Adequate for Discharge   Problem: Activity: Goal: Risk for activity intolerance will decrease Outcome: Adequate for Discharge   Problem: Nutrition: Goal: Adequate nutrition will be maintained Outcome: Adequate for Discharge   Problem: Coping: Goal: Level of anxiety will decrease Outcome: Adequate for Discharge   Problem: Elimination: Goal: Will not experience complications related to bowel motility Outcome: Adequate for Discharge Goal: Will not experience complications related to urinary retention Outcome: Adequate for Discharge   Problem: Pain Managment: Goal: General experience of comfort will improve Outcome: Adequate for Discharge   Problem: Safety: Goal: Ability to remain free from injury will improve Outcome: Adequate for Discharge   Problem: Skin Integrity: Goal: Risk for impaired skin integrity will decrease Outcome: Adequate for Discharge   Problem: SLP Dysphagia Goals Goal: Patient will utilize recommended strategies Description: Patient will utilize recommended strategies during swallow to increase swallowing safety with Outcome: Adequate for Discharge   Problem: Acute Rehab PT Goals(only PT should resolve) Goal: Pt will Roll Supine to Side Outcome: Adequate for Discharge Goal: Pt Will Go Supine/Side To Sit Outcome: Adequate for Discharge Goal: Pt Will Go Sit To Supine/Side Outcome: Adequate for Discharge Goal: Patient Will Transfer Sit To/From Stand Outcome: Adequate for Discharge Goal: Pt Will Transfer Bed To Chair/Chair To Bed Outcome: Adequate for Discharge Goal: Pt Will Ambulate Outcome: Adequate for Discharge   Problem: Acute Rehab OT Goals (only OT should resolve) Goal: Pt. Will Perform  Grooming Outcome: Adequate for Discharge Goal: Pt/Caregiver Will Perform Home Exercise Program Outcome: Adequate for Discharge Goal: OT Additional ADL Goal #1 Outcome: Adequate for Discharge Goal: OT Additional ADL Goal #2 Outcome: Adequate for Discharge

## 2022-06-09 LAB — VITAMIN B1: Vitamin B1 (Thiamine): 80.1 nmol/L (ref 66.5–200.0)

## 2022-06-22 ENCOUNTER — Emergency Department (HOSPITAL_COMMUNITY): Payer: Medicare HMO

## 2022-06-22 ENCOUNTER — Inpatient Hospital Stay (HOSPITAL_COMMUNITY)
Admission: EM | Admit: 2022-06-22 | Discharge: 2022-07-12 | DRG: 870 | Disposition: E | Payer: Medicare HMO | Source: Skilled Nursing Facility | Attending: Internal Medicine | Admitting: Internal Medicine

## 2022-06-22 DIAGNOSIS — Z66 Do not resuscitate: Secondary | ICD-10-CM | POA: Diagnosis present

## 2022-06-22 DIAGNOSIS — I2489 Other forms of acute ischemic heart disease: Secondary | ICD-10-CM | POA: Diagnosis present

## 2022-06-22 DIAGNOSIS — I447 Left bundle-branch block, unspecified: Secondary | ICD-10-CM | POA: Diagnosis present

## 2022-06-22 DIAGNOSIS — Z1152 Encounter for screening for COVID-19: Secondary | ICD-10-CM

## 2022-06-22 DIAGNOSIS — R0603 Acute respiratory distress: Secondary | ICD-10-CM

## 2022-06-22 DIAGNOSIS — D6959 Other secondary thrombocytopenia: Secondary | ICD-10-CM | POA: Diagnosis present

## 2022-06-22 DIAGNOSIS — Z515 Encounter for palliative care: Secondary | ICD-10-CM

## 2022-06-22 DIAGNOSIS — R0602 Shortness of breath: Secondary | ICD-10-CM | POA: Diagnosis present

## 2022-06-22 DIAGNOSIS — C3492 Malignant neoplasm of unspecified part of left bronchus or lung: Secondary | ICD-10-CM | POA: Diagnosis present

## 2022-06-22 DIAGNOSIS — G9341 Metabolic encephalopathy: Secondary | ICD-10-CM | POA: Diagnosis present

## 2022-06-22 DIAGNOSIS — Z833 Family history of diabetes mellitus: Secondary | ICD-10-CM

## 2022-06-22 DIAGNOSIS — Z87891 Personal history of nicotine dependence: Secondary | ICD-10-CM

## 2022-06-22 DIAGNOSIS — Z794 Long term (current) use of insulin: Secondary | ICD-10-CM

## 2022-06-22 DIAGNOSIS — K7200 Acute and subacute hepatic failure without coma: Secondary | ICD-10-CM | POA: Diagnosis present

## 2022-06-22 DIAGNOSIS — Z853 Personal history of malignant neoplasm of breast: Secondary | ICD-10-CM

## 2022-06-22 DIAGNOSIS — G9340 Encephalopathy, unspecified: Secondary | ICD-10-CM | POA: Diagnosis not present

## 2022-06-22 DIAGNOSIS — N179 Acute kidney failure, unspecified: Secondary | ICD-10-CM | POA: Diagnosis present

## 2022-06-22 DIAGNOSIS — J9621 Acute and chronic respiratory failure with hypoxia: Secondary | ICD-10-CM | POA: Diagnosis present

## 2022-06-22 DIAGNOSIS — J9601 Acute respiratory failure with hypoxia: Secondary | ICD-10-CM | POA: Diagnosis not present

## 2022-06-22 DIAGNOSIS — E11649 Type 2 diabetes mellitus with hypoglycemia without coma: Secondary | ICD-10-CM | POA: Diagnosis present

## 2022-06-22 DIAGNOSIS — A419 Sepsis, unspecified organism: Principal | ICD-10-CM

## 2022-06-22 DIAGNOSIS — C7801 Secondary malignant neoplasm of right lung: Secondary | ICD-10-CM | POA: Diagnosis present

## 2022-06-22 DIAGNOSIS — I5022 Chronic systolic (congestive) heart failure: Secondary | ICD-10-CM | POA: Diagnosis present

## 2022-06-22 DIAGNOSIS — J9690 Respiratory failure, unspecified, unspecified whether with hypoxia or hypercapnia: Secondary | ICD-10-CM | POA: Diagnosis present

## 2022-06-22 DIAGNOSIS — Z9981 Dependence on supplemental oxygen: Secondary | ICD-10-CM

## 2022-06-22 DIAGNOSIS — J123 Human metapneumovirus pneumonia: Secondary | ICD-10-CM

## 2022-06-22 DIAGNOSIS — R7881 Bacteremia: Secondary | ICD-10-CM | POA: Insufficient documentation

## 2022-06-22 DIAGNOSIS — E2740 Unspecified adrenocortical insufficiency: Secondary | ICD-10-CM | POA: Diagnosis present

## 2022-06-22 DIAGNOSIS — J189 Pneumonia, unspecified organism: Secondary | ICD-10-CM

## 2022-06-22 DIAGNOSIS — E8721 Acute metabolic acidosis: Secondary | ICD-10-CM | POA: Diagnosis present

## 2022-06-22 DIAGNOSIS — R6521 Severe sepsis with septic shock: Secondary | ICD-10-CM | POA: Diagnosis present

## 2022-06-22 DIAGNOSIS — Z7189 Other specified counseling: Secondary | ICD-10-CM | POA: Diagnosis not present

## 2022-06-22 DIAGNOSIS — L89150 Pressure ulcer of sacral region, unstageable: Secondary | ICD-10-CM | POA: Diagnosis present

## 2022-06-22 DIAGNOSIS — I110 Hypertensive heart disease with heart failure: Secondary | ICD-10-CM | POA: Diagnosis present

## 2022-06-22 DIAGNOSIS — Z8249 Family history of ischemic heart disease and other diseases of the circulatory system: Secondary | ICD-10-CM

## 2022-06-22 DIAGNOSIS — E785 Hyperlipidemia, unspecified: Secondary | ICD-10-CM | POA: Diagnosis present

## 2022-06-22 DIAGNOSIS — E440 Moderate protein-calorie malnutrition: Secondary | ICD-10-CM | POA: Diagnosis not present

## 2022-06-22 DIAGNOSIS — Z79899 Other long term (current) drug therapy: Secondary | ICD-10-CM

## 2022-06-22 DIAGNOSIS — Z7984 Long term (current) use of oral hypoglycemic drugs: Secondary | ICD-10-CM

## 2022-06-22 DIAGNOSIS — D6489 Other specified anemias: Secondary | ICD-10-CM | POA: Diagnosis present

## 2022-06-22 DIAGNOSIS — J984 Other disorders of lung: Secondary | ICD-10-CM | POA: Diagnosis present

## 2022-06-22 DIAGNOSIS — J9600 Acute respiratory failure, unspecified whether with hypoxia or hypercapnia: Secondary | ICD-10-CM | POA: Diagnosis not present

## 2022-06-22 DIAGNOSIS — Z7952 Long term (current) use of systemic steroids: Secondary | ICD-10-CM

## 2022-06-22 DIAGNOSIS — E43 Unspecified severe protein-calorie malnutrition: Secondary | ICD-10-CM | POA: Diagnosis present

## 2022-06-22 DIAGNOSIS — E1165 Type 2 diabetes mellitus with hyperglycemia: Secondary | ICD-10-CM | POA: Diagnosis present

## 2022-06-22 DIAGNOSIS — E875 Hyperkalemia: Secondary | ICD-10-CM | POA: Diagnosis present

## 2022-06-22 DIAGNOSIS — Z90710 Acquired absence of both cervix and uterus: Secondary | ICD-10-CM

## 2022-06-22 LAB — GLUCOSE, CAPILLARY
Glucose-Capillary: 119 mg/dL — ABNORMAL HIGH (ref 70–99)
Glucose-Capillary: 170 mg/dL — ABNORMAL HIGH (ref 70–99)
Glucose-Capillary: 183 mg/dL — ABNORMAL HIGH (ref 70–99)
Glucose-Capillary: 189 mg/dL — ABNORMAL HIGH (ref 70–99)

## 2022-06-22 LAB — LACTIC ACID, PLASMA
Lactic Acid, Venous: 5.9 mmol/L (ref 0.5–1.9)
Lactic Acid, Venous: 3.6 mmol/L (ref 0.5–1.9)

## 2022-06-22 LAB — RESPIRATORY PANEL BY PCR

## 2022-06-22 LAB — BASIC METABOLIC PANEL
Anion gap: 16 — ABNORMAL HIGH (ref 5–15)
BUN: 57 mg/dL — ABNORMAL HIGH (ref 8–23)
CO2: 21 mmol/L — ABNORMAL LOW (ref 22–32)
Calcium: 8.2 mg/dL — ABNORMAL LOW (ref 8.9–10.3)
Chloride: 95 mmol/L — ABNORMAL LOW (ref 98–111)
Creatinine, Ser: 1.51 mg/dL — ABNORMAL HIGH (ref 0.44–1.00)
GFR, Estimated: 36 mL/min — ABNORMAL LOW (ref >60)
Glucose, Bld: 221 mg/dL — ABNORMAL HIGH (ref 70–99)
Potassium: 6.8 mmol/L (ref 3.5–5.1)
Sodium: 132 mmol/L — ABNORMAL LOW (ref 135–145)

## 2022-06-22 LAB — URINALYSIS, W/ REFLEX TO CULTURE (INFECTION SUSPECTED)
Bilirubin Urine: NEGATIVE
Glucose, UA: NEGATIVE mg/dL
Ketones, ur: NEGATIVE mg/dL
Nitrite: NEGATIVE
Protein, ur: 30 mg/dL — AB
RBC / HPF: >50 RBC/hpf (ref 0–5)
Specific Gravity, Urine: 1.005 (ref 1.005–1.030)
WBC, UA: >50 WBC/hpf (ref 0–5)
pH: 7 (ref 5.0–8.0)

## 2022-06-22 LAB — RESP PANEL BY RT-PCR (RSV, FLU A&B, COVID)  RVPGX2
Influenza A by PCR: NEGATIVE
Influenza B by PCR: NEGATIVE
Resp Syncytial Virus by PCR: NEGATIVE
SARS Coronavirus 2 by RT PCR: NEGATIVE

## 2022-06-22 LAB — I-STAT ARTERIAL BLOOD GAS, ED
Acid-Base Excess: 6 mmol/L — ABNORMAL HIGH (ref 0.0–2.0)
Bicarbonate: 28.9 mmol/L — ABNORMAL HIGH (ref 20.0–28.0)
Calcium, Ion: 1.1 mmol/L — ABNORMAL LOW (ref 1.15–1.40)
HCT: 27 % — ABNORMAL LOW (ref 36.0–46.0)
Hemoglobin: 9.2 g/dL — ABNORMAL LOW (ref 12.0–15.0)
O2 Saturation: 100 %
Patient temperature: 98
Potassium: 5.4 mmol/L — ABNORMAL HIGH (ref 3.5–5.1)
Sodium: 132 mmol/L — ABNORMAL LOW (ref 135–145)
TCO2: 30 mmol/L (ref 22–32)
pCO2 arterial: 34.9 mmHg (ref 32–48)
pH, Arterial: 7.525 — ABNORMAL HIGH (ref 7.35–7.45)
pO2, Arterial: 505 mmHg — ABNORMAL HIGH (ref 83–108)

## 2022-06-22 LAB — COMPREHENSIVE METABOLIC PANEL
ALT: 61 U/L — ABNORMAL HIGH (ref 0–44)
AST: 99 U/L — ABNORMAL HIGH (ref 15–41)
Albumin: <1.5 g/dL — ABNORMAL LOW (ref 3.5–5.0)
Alkaline Phosphatase: 116 U/L (ref 38–126)
Anion gap: 17 — ABNORMAL HIGH (ref 5–15)
BUN: 55 mg/dL — ABNORMAL HIGH (ref 8–23)
CO2: 25 mmol/L (ref 22–32)
Calcium: 8.5 mg/dL — ABNORMAL LOW (ref 8.9–10.3)
Chloride: 92 mmol/L — ABNORMAL LOW (ref 98–111)
Creatinine, Ser: 1.33 mg/dL — ABNORMAL HIGH (ref 0.44–1.00)
GFR, Estimated: 41 mL/min — ABNORMAL LOW (ref >60)
Glucose, Bld: 103 mg/dL — ABNORMAL HIGH (ref 70–99)
Potassium: 6.6 mmol/L (ref 3.5–5.1)
Sodium: 134 mmol/L — ABNORMAL LOW (ref 135–145)
Total Bilirubin: 1.1 mg/dL (ref 0.3–1.2)
Total Protein: 4.7 g/dL — ABNORMAL LOW (ref 6.5–8.1)

## 2022-06-22 LAB — CBG MONITORING, ED: Glucose-Capillary: 68 mg/dL — ABNORMAL LOW (ref 70–99)

## 2022-06-22 LAB — CBC WITH DIFFERENTIAL/PLATELET
Abs Immature Granulocytes: 0.12 10*3/uL — ABNORMAL HIGH (ref 0.00–0.07)
Basophils Absolute: 0.1 10*3/uL (ref 0.0–0.1)
Basophils Relative: 1 %
Eosinophils Absolute: 0 10*3/uL (ref 0.0–0.5)
Eosinophils Relative: 0 %
HCT: 36.7 % (ref 36.0–46.0)
Hemoglobin: 11.5 g/dL — ABNORMAL LOW (ref 12.0–15.0)
Immature Granulocytes: 2 %
Lymphocytes Relative: 14 %
Lymphs Abs: 1 10*3/uL (ref 0.7–4.0)
MCH: 31.9 pg (ref 26.0–34.0)
MCHC: 31.3 g/dL (ref 30.0–36.0)
MCV: 101.9 fL — ABNORMAL HIGH (ref 80.0–100.0)
Monocytes Absolute: 0.2 10*3/uL (ref 0.1–1.0)
Monocytes Relative: 3 %
Neutro Abs: 5.4 10*3/uL (ref 1.7–7.7)
Neutrophils Relative %: 80 %
Platelets: DECREASED 10*3/uL (ref 150–400)
RBC: 3.6 MIL/uL — ABNORMAL LOW (ref 3.87–5.11)
RDW: 16 % — ABNORMAL HIGH (ref 11.5–15.5)
Smear Review: DECREASED
WBC Morphology: INCREASED
WBC: 6.8 10*3/uL (ref 4.0–10.5)
nRBC: 3.8 % — ABNORMAL HIGH (ref 0.0–0.2)

## 2022-06-22 LAB — I-STAT CHEM 8, ED
BUN: 48 mg/dL — ABNORMAL HIGH (ref 8–23)
Calcium, Ion: 1.05 mmol/L — ABNORMAL LOW (ref 1.15–1.40)
Chloride: 95 mmol/L — ABNORMAL LOW (ref 98–111)
Creatinine, Ser: 1.3 mg/dL — ABNORMAL HIGH (ref 0.44–1.00)
Glucose, Bld: 118 mg/dL — ABNORMAL HIGH (ref 70–99)
HCT: 31 % — ABNORMAL LOW (ref 36.0–46.0)
Hemoglobin: 10.5 g/dL — ABNORMAL LOW (ref 12.0–15.0)
Potassium: 5.5 mmol/L — ABNORMAL HIGH (ref 3.5–5.1)
Sodium: 134 mmol/L — ABNORMAL LOW (ref 135–145)
TCO2: 27 mmol/L (ref 22–32)

## 2022-06-22 LAB — TROPONIN I (HIGH SENSITIVITY)
Troponin I (High Sensitivity): 205 ng/L (ref <18)
Troponin I (High Sensitivity): 206 ng/L (ref <18)

## 2022-06-22 LAB — MAGNESIUM: Magnesium: 2 mg/dL (ref 1.7–2.4)

## 2022-06-22 LAB — MRSA NEXT GEN BY PCR, NASAL: MRSA by PCR Next Gen: NOT DETECTED

## 2022-06-22 MED ORDER — HYDROCORTISONE SOD SUC (PF) 100 MG IJ SOLR
100.0000 mg | Freq: Once | INTRAMUSCULAR | Status: AC
  Administered 2022-06-22: 100 mg via INTRAVENOUS
  Filled 2022-06-22: qty 2

## 2022-06-22 MED ORDER — FENTANYL BOLUS VIA INFUSION
25.0000 ug | INTRAVENOUS | Status: DC | PRN
  Administered 2022-06-22 – 2022-06-24 (×6): 50 ug via INTRAVENOUS
  Administered 2022-06-25: 100 ug via INTRAVENOUS
  Administered 2022-06-25 (×2): 50 ug via INTRAVENOUS

## 2022-06-22 MED ORDER — SODIUM CHLORIDE 0.9 % IV BOLUS (SEPSIS)
1000.0000 mL | Freq: Once | INTRAVENOUS | Status: AC
  Administered 2022-06-22: 1000 mL via INTRAVENOUS

## 2022-06-22 MED ORDER — DOCUSATE SODIUM 100 MG PO CAPS: 100.0000 mg | ORAL_CAPSULE | Freq: Two times a day (BID) | ORAL | Status: DC | PRN

## 2022-06-22 MED ORDER — VANCOMYCIN HCL 750 MG/150ML IV SOLN: 750.0000 mg | INTRAVENOUS | Status: DC

## 2022-06-22 MED ORDER — ORAL CARE MOUTH RINSE: 15.0000 mL | OROMUCOSAL | Status: DC | PRN

## 2022-06-22 MED ORDER — FAMOTIDINE 40 MG/5ML PO SUSR
20.0000 mg | Freq: Every day | ORAL | Status: DC
  Administered 2022-06-22 – 2022-06-23 (×2): 20 mg
  Filled 2022-06-22 (×2): qty 2.5

## 2022-06-22 MED ORDER — DEXTROSE 50 % IV SOLN
1.0000 | Freq: Once | INTRAVENOUS | Status: DC
  Filled 2022-06-22: qty 50

## 2022-06-22 MED ORDER — CALCIUM GLUCONATE-NACL 1-0.675 GM/50ML-% IV SOLN
1.0000 g | Freq: Once | INTRAVENOUS | Status: AC
  Administered 2022-06-22: 1000 mg via INTRAVENOUS
  Filled 2022-06-22: qty 50

## 2022-06-22 MED ORDER — INSULIN ASPART 100 UNIT/ML IV SOLN
5.0000 [IU] | Freq: Once | INTRAVENOUS | Status: AC
  Administered 2022-06-22: 5 [IU] via INTRAVENOUS

## 2022-06-22 MED ORDER — NOREPINEPHRINE 16 MG/250ML-% IV SOLN
0.0000 ug/min | INTRAVENOUS | Status: DC
  Administered 2022-06-22 (×2): 55 ug/min via INTRAVENOUS
  Administered 2022-06-23: 20 ug/min via INTRAVENOUS
  Administered 2022-06-23: 38 ug/min via INTRAVENOUS
  Administered 2022-06-23: 55 ug/min via INTRAVENOUS
  Administered 2022-06-24: 14 ug/min via INTRAVENOUS
  Administered 2022-06-25: 9 ug/min via INTRAVENOUS
  Filled 2022-06-22 (×7): qty 250

## 2022-06-22 MED ORDER — DEXTROSE 50 % IV SOLN
1.0000 | Freq: Once | INTRAVENOUS | Status: AC
  Administered 2022-06-22: 50 mL via INTRAVENOUS
  Filled 2022-06-22: qty 50

## 2022-06-22 MED ORDER — MIDAZOLAM HCL 2 MG/2ML IJ SOLN
0.5000 mg | INTRAMUSCULAR | Status: DC | PRN
  Administered 2022-06-22 – 2022-06-24 (×4): 2 mg via INTRAVENOUS
  Filled 2022-06-22 (×4): qty 2

## 2022-06-22 MED ORDER — ALBUTEROL SULFATE (2.5 MG/3ML) 0.083% IN NEBU
10.0000 mg | INHALATION_SOLUTION | Freq: Once | RESPIRATORY_TRACT | Status: AC
  Administered 2022-06-22: 10 mg via RESPIRATORY_TRACT
  Filled 2022-06-22: qty 12

## 2022-06-22 MED ORDER — ALBUMIN HUMAN 5 % IV SOLN
12.5000 g | Freq: Once | INTRAVENOUS | Status: AC
  Administered 2022-06-22: 12.5 g via INTRAVENOUS
  Filled 2022-06-22: qty 250

## 2022-06-22 MED ORDER — ORAL CARE MOUTH RINSE
15.0000 mL | OROMUCOSAL | Status: DC
  Administered 2022-06-22 – 2022-06-26 (×44): 15 mL via OROMUCOSAL

## 2022-06-22 MED ORDER — DAKINS (1/4 STRENGTH) 0.125 % EX SOLN
Freq: Two times a day (BID) | CUTANEOUS | Status: DC
  Administered 2022-06-23: 1 via TOPICAL
  Filled 2022-06-22 (×3): qty 473

## 2022-06-22 MED ORDER — INSULIN ASPART 100 UNIT/ML IJ SOLN
0.0000 [IU] | INTRAMUSCULAR | Status: DC
  Administered 2022-06-22: 2 [IU] via SUBCUTANEOUS
  Administered 2022-06-23 (×3): 3 [IU] via SUBCUTANEOUS

## 2022-06-22 MED ORDER — VASOPRESSIN 20 UNITS/100 ML INFUSION FOR SHOCK
0.0000 [IU]/min | INTRAVENOUS | Status: DC
  Administered 2022-06-22: 0.03 [IU]/min via INTRAVENOUS
  Filled 2022-06-22: qty 100

## 2022-06-22 MED ORDER — SODIUM CHLORIDE 0.9 % IV BOLUS
500.0000 mL | Freq: Once | INTRAVENOUS | Status: AC
  Administered 2022-06-22: 500 mL via INTRAVENOUS

## 2022-06-22 MED ORDER — INSULIN ASPART 100 UNIT/ML IV SOLN: 5.0000 [IU] | Freq: Once | INTRAVENOUS | Status: DC

## 2022-06-22 MED ORDER — DOCUSATE SODIUM 50 MG/5ML PO LIQD
100.0000 mg | Freq: Two times a day (BID) | ORAL | Status: DC | PRN
  Administered 2022-06-25: 100 mg
  Filled 2022-06-22: qty 10

## 2022-06-22 MED ORDER — HEPARIN SODIUM (PORCINE) 5000 UNIT/ML IJ SOLN
5000.0000 [IU] | Freq: Three times a day (TID) | INTRAMUSCULAR | Status: DC
  Administered 2022-06-22 – 2022-06-24 (×6): 5000 [IU] via SUBCUTANEOUS
  Filled 2022-06-22 (×6): qty 1

## 2022-06-22 MED ORDER — NOREPINEPHRINE 4 MG/250ML-% IV SOLN
0.0000 ug/min | INTRAVENOUS | Status: DC
  Administered 2022-06-22: 5 ug/min via INTRAVENOUS
  Filled 2022-06-22: qty 250

## 2022-06-22 MED ORDER — SODIUM CHLORIDE 0.9 % IV SOLN
2.0000 g | Freq: Two times a day (BID) | INTRAVENOUS | Status: DC
  Administered 2022-06-23 – 2022-06-26 (×8): 2 g via INTRAVENOUS
  Filled 2022-06-22 (×8): qty 12.5

## 2022-06-22 MED ORDER — ROCURONIUM BROMIDE 50 MG/5ML IV SOLN
INTRAVENOUS | Status: DC | PRN
  Administered 2022-06-22: 90 mg via INTRAVENOUS

## 2022-06-22 MED ORDER — PANTOPRAZOLE SODIUM 40 MG PO TBEC: 40.0000 mg | DELAYED_RELEASE_TABLET | Freq: Every day | ORAL | Status: DC

## 2022-06-22 MED ORDER — SODIUM ZIRCONIUM CYCLOSILICATE 10 G PO PACK
10.0000 g | PACK | Freq: Every day | ORAL | Status: DC
  Administered 2022-06-22 – 2022-06-23 (×2): 10 g
  Filled 2022-06-22 (×2): qty 1

## 2022-06-22 MED ORDER — ALBUMIN HUMAN 25 % IV SOLN
INTRAVENOUS | Status: AC
  Filled 2022-06-22: qty 100

## 2022-06-22 MED ORDER — NOREPINEPHRINE 16 MG/250ML-% IV SOLN
0.0000 ug/min | INTRAVENOUS | Status: DC
  Administered 2022-06-22: 17 ug/min via INTRAVENOUS
  Filled 2022-06-22: qty 250

## 2022-06-22 MED ORDER — VANCOMYCIN HCL IN DEXTROSE 1-5 GM/200ML-% IV SOLN: 1000.0000 mg | Freq: Once | INTRAVENOUS | Status: DC

## 2022-06-22 MED ORDER — ETOMIDATE 2 MG/ML IV SOLN
INTRAVENOUS | Status: DC | PRN
  Administered 2022-06-22: 10 mg via INTRAVENOUS

## 2022-06-22 MED ORDER — FENTANYL 2500MCG IN NS 250ML (10MCG/ML) PREMIX INFUSION
25.0000 ug/h | INTRAVENOUS | Status: DC
  Administered 2022-06-22 (×3): 25 ug/h via INTRAVENOUS
  Administered 2022-06-23: 75 ug/h via INTRAVENOUS
  Administered 2022-06-25: 50 ug/h via INTRAVENOUS
  Filled 2022-06-22 (×3): qty 250

## 2022-06-22 MED ORDER — METRONIDAZOLE 500 MG/100ML IV SOLN
500.0000 mg | Freq: Once | INTRAVENOUS | Status: AC
  Administered 2022-06-22: 500 mg via INTRAVENOUS
  Filled 2022-06-22: qty 100

## 2022-06-22 MED ORDER — VASOPRESSIN 20 UNITS/100 ML INFUSION FOR SHOCK
0.0000 [IU]/min | INTRAVENOUS | Status: DC
  Administered 2022-06-22 – 2022-06-24 (×6): 0.04 [IU]/min via INTRAVENOUS
  Administered 2022-06-24: 0.03 [IU]/min via INTRAVENOUS
  Filled 2022-06-22 (×6): qty 100

## 2022-06-22 MED ORDER — LACTATED RINGERS IV SOLN: INTRAVENOUS | Status: AC

## 2022-06-22 MED ORDER — POLYETHYLENE GLYCOL 3350 17 G PO PACK
17.0000 g | PACK | Freq: Every day | ORAL | Status: DC | PRN
  Administered 2022-06-25: 17 g
  Filled 2022-06-22: qty 1

## 2022-06-22 MED ORDER — SODIUM CHLORIDE 0.9 % IV SOLN
2.0000 g | Freq: Once | INTRAVENOUS | Status: AC
  Administered 2022-06-22: 2 g via INTRAVENOUS
  Filled 2022-06-22: qty 12.5

## 2022-06-22 MED ORDER — VANCOMYCIN HCL 1500 MG/300ML IV SOLN
1500.0000 mg | Freq: Once | INTRAVENOUS | Status: AC
  Administered 2022-06-22: 1500 mg via INTRAVENOUS
  Filled 2022-06-22 (×2): qty 300

## 2022-06-22 NOTE — Progress Notes (Signed)
RT assisted with patient transport on vent from ED to [phone] without complications.

## 2022-06-22 NOTE — IPAL (Signed)
Interdisciplinary Goals of Care Family Meeting   Date carried out:: 06/22/2022  Location of the meeting: Bedside  Member's involved: Physician, Bedside Registered Nurse, Social Worker, and Family Member or next of kin  [place] of Attorney or acting medical decision maker: Charlene Houston and Charlene Houston    Discussion: We discussed goals of care for Charlene Houston .    The Clinical status was relayed to patient's daughter and son at bedside in detail.   Updated and notified of patients medical condition. Explained that she has untreated lung cancer as she developed side effect from chemotherapy, now with multiple site infection and organ dysfunction and has infected decubitus ulcer due to immobility. Unfortunately things will progress in the setting of untreated cancer and decubitus ulcer rather than improve at this point.  Patient's family decided to keep her DNR, they will continue to discuss regarding hospice care upon discharge if she comes off of ventilator    Code status: Full DNR  Disposition: Continue current acute care    Family are satisfied with Plan of action and management. All questions answered   [name] MD [facility] See Amion for pager If no response to pager, please call [phone] until 7pm After 7pm, Please call E-link [phone]

## 2022-06-22 NOTE — ED Triage Notes (Signed)
Pt BIB EMS from [facility] for respiratory distress, pt cold to touch and breathing 30 RR. Dementia at baseline but can answer basic questions and follow commands. On arrival lots of mouth breathing and rhonchi lung sounds. Aox1.

## 2022-06-22 NOTE — Progress Notes (Signed)
Dr. Tacy Learn was notified of elevated ST segments on bedside cardiac monitor. No 12 lead ordered at this time. Also, patient is to be full code per Dr. Tacy Learn until can discuss with family. Low platelet count was relayed to him as well since patient has orders for heparin sq and no changes in those orders were made.

## 2022-06-22 NOTE — ED Notes (Addendum)
MD at bedside notified this RN to titrate Levophed up to 47mg in order to stabilize patients BP

## 2022-06-22 NOTE — TOC Progression Note (Signed)
Transition of Care [facility]) - Initial/Assessment Note    Patient Details  Name: Charlene Houston MRN: [id number] Date of Birth: [birth year]  Transition [facility]) CM/SW Contact:    Milinda Antis, LCSWA Phone Number: 06/22/2022, 3:34 PM  Clinical Narrative:                 LCSW contacted admissions at [facility].  Patient is long term care at the facility.  TOC following.    Patient Goals and CMS Choice            Expected Discharge Plan and Services                                              Prior Living Arrangements/Services                       Activities of Daily Living      Permission Sought/Granted                  Emotional Assessment              Admission diagnosis:  Respiratory failure ([place]) [J96.90] Respiratory distress [R06.03] Encephalopathy acute [G93.40] Septic shock ([place]) [A41.9, R65.21] Pneumonia of right lower lobe due to infectious organism [J18.9] Patient Active Problem List   Diagnosis Date Noted   Respiratory failure ([place]) 06/22/2022   Pneumonia of right lower lobe due to infectious organism Q000111Q   Acute metabolic encephalopathy Q000111Q   Pressure injury of skin 04/23/2022   Class 1 obesity 04/23/2022   Acute on chronic systolic CHF (congestive heart failure) ([place]) 04/22/2022   Encephalopathy 04/22/2022   Drug-induced pneumonitis    Acute combined systolic and diastolic congestive heart failure ([place]) 01/03/2022   Type 2 diabetes mellitus ([place]) 01/01/2022   AKI (acute kidney injury) ([place]) 01/01/2022   Dizziness 06/10/2021   Respiratory failure with hypoxia and hypercapnia ([place]) 06/10/2021   Hypokalemia 06/10/2021   Hypomagnesemia 06/10/2021   GERD (gastroesophageal reflux disease) 06/10/2021   Non-small cell lung cancer ([place]) 06/10/2021   COVID-19 virus infection 04/23/2020   Hypoglycemia 04/22/2020   Rhabdomyolysis 04/22/2020   Essential hypertension 04/22/2020   HLD  (hyperlipidemia) 04/22/2020   Sleep apnea 04/22/2020   Diabetes mellitus type 2 in nonobese ([place]) 04/22/2020   SBO (small bowel obstruction) ([place]) 07/06/2018   PCP:  Lilian Coma., MD Pharmacy:   [facility] [place] [place] [address] [address] [place] [address] Phone: [phone] Fax: [phone]     Social Determinants of Health (SDOH) Social History: SDOH Screenings   Food Insecurity: No Food Insecurity (01/04/2022)  Housing: Low Risk  (01/04/2022)  Transportation Needs: No Transportation Needs (01/04/2022)  Utilities: Not At Risk (01/04/2022)  Alcohol Screen: Low Risk  (01/04/2022)  Financial Resource Strain: Low Risk  (01/04/2022)  Tobacco Use: Medium Risk (05/26/2022)   SDOH Interventions:     Readmission Risk Interventions     No data to display

## 2022-06-22 NOTE — H&P (Signed)
NAME:  Charlene Houston, MRN:  [id number], DOB:  1945-11-30, LOS: 0 ADMISSION DATE:  06/22/2022, CONSULTATION DATE:  06/22/22 REFERRING MD:  EDP  CHIEF COMPLAINT:  shortness of breath   History of Present Illness:  Charlene Houston is a 77 y.o. 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  She developed pneumonitis and treatment was discontinued.  She subsequently had a decline in baseline functional status and has been residing in nursing facility with recent admission for  Klebsiella UTI.  She presented to the ED on 3/12 with respiratory distress and altered mental status.  She was intubated on arrival to the ED, work-up significant for RLL pneumonia on CT and lactic acid of 5.9, K 6.6, creatinine 1.3, ALT 99, AST 61, WBC 6.8, troponin 205, pH 7.5, pO2 505.  She was treated with IVF and broad spectrum antibiotics and required Levophed for blood pressure support. PCCM consulted for admission.   She has a known sacral decubitus ulcer, no gas tracking or abscess on CT.  Pertinent  Medical History   has a past medical history of Arthritis, Asthma, Breast cancer ([place]), Diabetes mellitus without complication ([place]), Hypertension, and SBO (small bowel obstruction) ([place]) (06/2018).   Significant Hospital Events: Including procedures, antibiotic start and stop dates in addition to other pertinent events   3/12 admit to PCCM with septic shock on levophed   Interim History / Subjective:  Pt examined after intubation  Objective   Blood pressure (!) 107/46, pulse 91, temperature 99.6 F (37.6 C), temperature source Rectal, resp. rate (!) 25, height '5\' 5"'$  (1.651 m), weight 90.4 kg, SpO2 100 %.    Vent Mode: PRVC FiO2 (%):  [50 %-100 %] 50 % Set Rate:  [25 bmp] 25 bmp Vt Set:  [450 mL] 450 mL PEEP:  [5 cmH20] 5 cmH20 Plateau Pressure:  [24 cmH20] 24  cmH20   Intake/Output Summary (Last 24 hours) at 06/22/2022 1346 Last data filed at 06/22/2022 1341 Gross per 24 hour  Intake 1253.53 ml  Output --  Net 1253.53 ml   Filed Weights   06/22/22 0953  Weight: 90.4 kg    General:  chronically ill-appearing F, intubated and sedated  HEENT: MM pink/moist, sclera anicteric, PERRLA Neuro: examined on fentanyl 71mg, unresponsive, triggering vent, appears uncomfortable CV: s1s2 rrr, no m/r/g PULM:  rhonchi and scattered wheezing bilaterally with equal chest on full mechanical support, R chest wall port accessed GI/GU: soft, non-tender, indwelling foley catheter Extremities: warm/dry, 2+ pitting edema  Skin: no rashes or lesions, sacral decub as previously documented below      Resolved Hospital Problem list     Assessment & Plan:    Acute Hypoxic Respiratory Failure Secondary to R-sided pneumonia  Possible UTI -admit to ICU  -Maintain full vent support with SAT/SBT as tolerated -titrate Vent setting to maintain SpO2 greater than or equal to 90%. -HOB elevated 30 degrees. -Plateau pressures less than 30 cm H20.  -Follow chest x-ray, ABG prn.   -Bronchial hygiene and RT/bronchodilator protocol.    Septic Shock -continue vancomycin and cefepime -follow blood and urine cultures, respiratory culture, RVP, urine strep and legionella -received 2.5L IVF, continue maintenance and trend lactic -continue levophed to maintain MAP >65, add vasopressin     Type 2 DM -SSI   AKI with hyperkalemia  Baseline creatinine .8-.9, 1.3  on admission -likely secondary to shock -follow BMP and UOP, avoid nephrotoxins   Elevated troponin -elevated to ~200, EKG with LBBB though not new -trend troponin, consider repeat echo if rising, last was two months ago  Elevated transaminases  Likely secondary to shock liver -cholelithiasis noted on CT, if worsening consider RUQ [place]   Best Practice (right click and "Reselect all SmartList  Selections" daily)   Diet/type: NPO DVT prophylaxis: prophylactic heparin  GI prophylaxis: PPI Lines: N/A Foley:  Yes, and it is still needed Code Status:  DNR was reported, however cannot confirm with family so change to full code Last date of multidisciplinary goals of care discussion [Dr. Tacy Learn tried to reach family without success]  Labs   CBC: Recent Labs  Lab 06/22/22 1005 06/22/22 1044 06/22/22 1106  WBC 6.8  --   --   NEUTROABS 5.4  --   --   HGB 11.5* 10.5* 9.2*  HCT 36.7 31.0* 27.0*  MCV 101.9*  --   --   PLT PLATELET CLUMPS NOTED ON SMEAR, COUNT APPEARS DECREASED  --   --     Basic Metabolic Panel: Recent Labs  Lab 06/22/22 1005 06/22/22 1044 06/22/22 1106  NA 134* 134* 132*  K 6.6* 5.5* 5.4*  CL 92* 95*  --   CO2 25  --   --   GLUCOSE 103* 118*  --   BUN 55* 48*  --   CREATININE 1.33* 1.30*  --   CALCIUM 8.5*  --   --   MG 2.0  --   --    GFR: Estimated Creatinine Clearance: 40.9 mL/min (A) (by C-G formula based on SCr of 1.3 mg/dL (H)). Recent Labs  Lab 06/22/22 1005  WBC 6.8  LATICACIDVEN 5.9*    Liver Function Tests: Recent Labs  Lab 06/22/22 1005  AST 99*  ALT 61*  ALKPHOS 116  BILITOT 1.1  PROT 4.7*  ALBUMIN <1.5*   No results for input(s): "LIPASE", "AMYLASE" in the last 168 hours. No results for input(s): "AMMONIA" in the last 168 hours.  ABG    Component Value Date/Time   PHART 7.525 (H) 06/22/2022 1106   PCO2ART 34.9 06/22/2022 1106   PO2ART 505 (H) 06/22/2022 1106   HCO3 28.9 (H) 06/22/2022 1106   TCO2 30 06/22/2022 1106   O2SAT 100 06/22/2022 1106     Coagulation Profile: No results for input(s): "INR", "PROTIME" in the last 168 hours.  Cardiac Enzymes: No results for input(s): "CKTOTAL", "CKMB", "CKMBINDEX", "TROPONINI" in the last 168 hours.  HbA1C: Hgb A1c MFr Bld  Date/Time Value Ref Range Status  05/26/2022 06:04 PM 8.3 (H) 4.8 - 5.6 % Final    Comment:    (NOTE) Pre diabetes:           5.7%-6.4%  Diabetes:              >6.4%  Glycemic control for   <7.0% adults with diabetes   01/02/2022 03:46 AM 6.0 (H) 4.8 - 5.6 % Final    Comment:    (NOTE) Pre diabetes:          5.7%-6.4%  Diabetes:              >6.4%  Glycemic control for   <7.0% adults with diabetes     CBG: Recent Labs  Lab 06/22/22 0949  GLUCAP 68*    Review of Systems:   Unable to obtain  Past Medical History:  She,  has a past medical history  of Arthritis, Asthma, Breast cancer ([place]), Diabetes mellitus without complication ([place]), Hypertension, and SBO (small bowel obstruction) ([place]) (06/2018).   Surgical History:   Past Surgical History:  Procedure Laterality Date   ABDOMINAL HYSTERECTOMY     THUMB AMPUTATION Left    PARTIAL   TONSILLECTOMY       Social History:   reports that she quit smoking about 29 years ago. Her smoking use included cigarettes. She has never used smokeless tobacco. She reports that she does not currently use alcohol. She reports that she does not currently use drugs.   Family History:  Her family history includes Cancer in her father; Diabetes in her mother; Hypertension in her mother.   Allergies Allergies  Allergen Reactions   Prozac [Fluoxetine Hcl] Other (See Comments)    Jitters   Safflower Oil Other (See Comments)    Unknown reaction Documented on MAR     Home Medications  Prior to Admission medications   Medication Sig Start Date End Date Taking? Authorizing Provider  acetaminophen (TYLENOL) 650 MG CR tablet Take 1 tablet (650 mg total) by mouth every 8 (eight) hours as needed for pain. 04/29/22  Yes Domenic Polite, MD  albuterol (ACCUNEB) 1.25 MG/3ML nebulizer solution Take 1 ampule by nebulization every 6 (six) hours as needed for wheezing.   Yes [provider]  Amino Acids-Protein Hydrolys (PRO-STAT) LIQD Take 30 mLs by mouth 2 (two) times daily.   Yes [provider]  barrier cream (NON-SPECIFIED) CREA Apply 1 Application  topically See admin instructions. Apply topically to sacral area twice a day with each incontinent episode.   Yes [provider]  bisacodyl (DULCOLAX) 10 MG suppository Place 10 mg rectally daily as needed (constipation not relived by Milk of Magnesia).   Yes [provider]  carboxymethylcellulose (REFRESH PLUS) 0.5 % SOLN Place 1 drop into both eyes daily as needed (dry eyes).   Yes [provider]  carvedilol (COREG) 3.125 MG tablet Take 1 tablet (3.125 mg total) by mouth 2 (two) times daily. 03/01/22  Yes Sabharwal, Aditya, DO  cyanocobalamin (VITAMIN B12) 1000 MCG/ML injection Inject 1,000 mcg into the muscle every 30 (thirty) days.   Yes [provider]  Diaper Rash Products (MEDI-PASTE) OINT Place 1 Application rectally daily as needed (Itching).   Yes [provider]  empagliflozin (JARDIANCE) 10 MG TABS tablet Take 1 tablet (10 mg total) by mouth daily. 01/13/22  Yes Simmons, Brittainy M, PA-C  escitalopram (LEXAPRO) 5 MG tablet Take 5 mg by mouth daily.   Yes [provider]  folic acid (FOLVITE) 1 MG tablet Take 1 mg by mouth daily.   Yes [provider]  furosemide (LASIX) 40 MG tablet Take 1 tablet (40 mg total) by mouth 2 (two) times daily. 02/04/22  Yes Sabharwal, Aditya, DO  glimepiride (AMARYL) 2 MG tablet Take 2 mg by mouth daily.   Yes [provider]  Glucerna Barrie Folk) LIQD Take 1 Can by mouth every evening.   Yes [provider]  insulin aspart (NOVOLOG) 100 UNIT/ML injection Inject 5 Units into the skin in the morning, at noon, and at bedtime.   Yes [provider]  insulin glargine (LANTUS) 100 UNIT/ML injection Inject 10 Units into the skin 2 (two) times daily.   Yes [provider]  Magnesium Hydroxide (MILK OF MAGNESIA PO) Take 30 mLs by mouth daily as needed (constipation).   Yes [provider]  montelukast (SINGULAIR) 10 MG tablet Take 10 mg by  mouth daily.   Yes  [provider]  Nystatin (GERHARDT'S BUTT CREAM) CREA Apply 1 Application topically 3 (three) times daily. Apply to groin, upper thighs and perineum 3 times daily and prn soiling. 06/01/22  Yes Bonnielee Haff, MD  potassium chloride SA (KLOR-CON M) 20 MEQ tablet Take 1 tablet (20 mEq total) by mouth daily. 03/26/22  Yes Sabharwal, Aditya, DO  predniSONE (DELTASONE) 5 MG tablet Take 1 tablet (5 mg total) by mouth daily with breakfast. 06/01/22  Yes Bonnielee Haff, MD  simvastatin (ZOCOR) 40 MG tablet Take 40 mg by mouth at bedtime.   Yes [provider]  Sodium Phosphates (RA SALINE ENEMA RE) Place 1 enema rectally daily as needed (constipation not relived by bisacodyl suppository.).   Yes [provider]  spironolactone (ALDACTONE) 25 MG tablet Take 1 tablet (25 mg total) by mouth daily. 03/26/22  Yes Sabharwal, Aditya, DO  traMADol (ULTRAM) 50 MG tablet Take 1 tablet (50 mg total) by mouth every 12 (twelve) hours as needed for severe pain. 06/01/22  Yes Bonnielee Haff, MD  Vitamin D, Cholecalciferol, 10 MCG (400 UNIT) CAPS Take 400 Units by mouth See admin instructions. Take 400 units (1 tablet) by mouth daily Monday - Saturday and 800 units (2 tablets) on Sunday.   Yes [provider]  leptospermum manuka honey (MEDIHONEY) PSTE paste Apply 1 Application topically daily. 06/01/22   Bonnielee Haff, MD  NON FORMULARY 1 Device by Other route at bedtime. BIPAP.    [provider]  OXYGEN Inhale 2 L/min into the lungs continuous.    [provider]     Critical care time: 45 minutes     CRITICAL CARE Performed by: Otilio Carpen Rakan Soffer   Total critical care time: 45 minutes  Critical care time was exclusive of separately billable procedures and treating other patients.  Critical care was necessary to treat or prevent imminent or life-threatening deterioration.  Critical care was time spent personally by me on the following activities:  development of treatment plan with patient and/or surrogate as well as nursing, discussions with consultants, evaluation of patient's response to treatment, examination of patient, obtaining history from patient or surrogate, ordering and performing treatments and interventions, ordering and review of laboratory studies, ordering and review of radiographic studies, pulse oximetry and re-evaluation of patient's condition.   Otilio Carpen Sade Hollon, PA-C [facility] See Amion for pager If no response to pager , please call [phone] until 7pm After 7:00 pm call Elink  [phone]?[email or web address]

## 2022-06-22 NOTE — ED Provider Notes (Signed)
[facility] Provider Note   CSN: [id number] Arrival date & time: 06/22/22  [id number]     History  Chief Complaint  Patient presents with   Respiratory Distress    Charlene Houston is a 77 y.o. female.  HPI Patient presents for respiratory distress.  Medical history includes arthritis, asthma, DM, HTN, SBO, GERD, sleep apnea, HLD, remote breast cancer s/p lumpectomy, chronic hypoxic respiratory failure on 2 L at baseline, non-small cell lung cancer.  She had a admission to the hospital 1 month ago for encephalopathy secondary to UTI.  She arrives from nursing facility by EMS.  Nursing facility called EMS this morning due to altered mental status and respiratory distress.  When EMS arrived on scene, patient was tachypneic with shallow breathing.  She had coarse lung sounds on auscultation.  She was minimally responsive.  They noted normal blood glucose.  Heart rate was in the range of 100.  She was normotensive.    Home Medications Prior to Admission medications   Medication Sig Start Date End Date Taking? Authorizing Provider  acetaminophen (TYLENOL) 650 MG CR tablet Take 1 tablet (650 mg total) by mouth every 8 (eight) hours as needed for pain. 04/29/22  Yes Domenic Polite, MD  albuterol (ACCUNEB) 1.25 MG/3ML nebulizer solution Take 1 ampule by nebulization every 6 (six) hours as needed for wheezing.   Yes [provider]  Amino Acids-Protein Hydrolys (PRO-STAT) LIQD Take 30 mLs by mouth 2 (two) times daily.   Yes [provider]  barrier cream (NON-SPECIFIED) CREA Apply 1 Application topically See admin instructions. Apply topically to sacral area twice a day with each incontinent episode.   Yes [provider]  bisacodyl (DULCOLAX) 10 MG suppository Place 10 mg rectally daily as needed (constipation not relived by Milk of Magnesia).   Yes [provider]  carboxymethylcellulose (REFRESH PLUS) 0.5 % SOLN  Place 1 drop into both eyes daily as needed (dry eyes).   Yes [provider]  carvedilol (COREG) 3.125 MG tablet Take 1 tablet (3.125 mg total) by mouth 2 (two) times daily. 03/01/22  Yes Sabharwal, Aditya, DO  cyanocobalamin (VITAMIN B12) 1000 MCG/ML injection Inject 1,000 mcg into the muscle every 30 (thirty) days.   Yes [provider]  Diaper Rash Products (MEDI-PASTE) OINT Place 1 Application rectally daily as needed (Itching).   Yes [provider]  empagliflozin (JARDIANCE) 10 MG TABS tablet Take 1 tablet (10 mg total) by mouth daily. 01/13/22  Yes Simmons, Brittainy M, PA-C  escitalopram (LEXAPRO) 5 MG tablet Take 5 mg by mouth daily.   Yes [provider]  folic acid (FOLVITE) 1 MG tablet Take 1 mg by mouth daily.   Yes [provider]  furosemide (LASIX) 40 MG tablet Take 1 tablet (40 mg total) by mouth 2 (two) times daily. 02/04/22  Yes Sabharwal, Aditya, DO  glimepiride (AMARYL) 2 MG tablet Take 2 mg by mouth daily.   Yes [provider]  Glucerna Barrie Folk) LIQD Take 1 Can by mouth every evening.   Yes [provider]  insulin aspart (NOVOLOG) 100 UNIT/ML injection Inject 5 Units into the skin in the morning, at noon, and at bedtime.   Yes [provider]  insulin glargine (LANTUS) 100 UNIT/ML injection Inject 10 Units into the skin 2 (two) times daily.   Yes [provider]  Magnesium Hydroxide (MILK OF MAGNESIA PO) Take 30 mLs by mouth daily as needed (constipation).  Yes [provider]  montelukast (SINGULAIR) 10 MG tablet Take 10 mg by mouth daily.   Yes [provider]  Nystatin (GERHARDT'S BUTT CREAM) CREA Apply 1 Application topically 3 (three) times daily. Apply to groin, upper thighs and perineum 3 times daily and prn soiling. 06/01/22  Yes Bonnielee Haff, MD  potassium chloride SA (KLOR-CON M) 20 MEQ tablet Take 1 tablet (20 mEq total) by mouth daily. 03/26/22  Yes Sabharwal,  Aditya, DO  predniSONE (DELTASONE) 5 MG tablet Take 1 tablet (5 mg total) by mouth daily with breakfast. 06/01/22  Yes Bonnielee Haff, MD  simvastatin (ZOCOR) 40 MG tablet Take 40 mg by mouth at bedtime.   Yes [provider]  Sodium Phosphates (RA SALINE ENEMA RE) Place 1 enema rectally daily as needed (constipation not relived by bisacodyl suppository.).   Yes [provider]  spironolactone (ALDACTONE) 25 MG tablet Take 1 tablet (25 mg total) by mouth daily. 03/26/22  Yes Sabharwal, Aditya, DO  traMADol (ULTRAM) 50 MG tablet Take 1 tablet (50 mg total) by mouth every 12 (twelve) hours as needed for severe pain. 06/01/22  Yes Bonnielee Haff, MD  Vitamin D, Cholecalciferol, 10 MCG (400 UNIT) CAPS Take 400 Units by mouth See admin instructions. Take 400 units (1 tablet) by mouth daily Monday - Saturday and 800 units (2 tablets) on Sunday.   Yes [provider]  leptospermum manuka honey (MEDIHONEY) PSTE paste Apply 1 Application topically daily. 06/01/22   Bonnielee Haff, MD  NON FORMULARY 1 Device by Other route at bedtime. BIPAP.    [provider]  OXYGEN Inhale 2 L/min into the lungs continuous.    [provider]  Witch Hazel (TUCKS EX) Apply 1 application  topically as needed (itching). Tucks 50% Medicated Cool Pads    [provider]      Allergies    Prozac [fluoxetine hcl] and Safflower oil    Review of Systems   Review of Systems  Unable to perform ROS: Mental status change    Physical Exam Updated Vital Signs BP (!) 107/46   Pulse 91   Temp 99.6 F (37.6 C) (Rectal)   Resp (!) 25   Ht '5\' 5"'$  (1.651 m)   Wt 90.4 kg   SpO2 100%   BMI 33.16 kg/m  Physical Exam Constitutional:      Appearance: She is ill-appearing. She is not toxic-appearing or diaphoretic.  HENT:     Head: Normocephalic and atraumatic.     Right Ear: External ear normal.     Left Ear: External ear normal.     Nose: Nose normal.     Mouth/Throat:      Mouth: Mucous membranes are moist.  Eyes:     Pupils: Pupils are equal, round, and reactive to light.  Cardiovascular:     Rate and Rhythm: Regular rhythm. Tachycardia present.     Heart sounds: No murmur heard. Pulmonary:     Effort: Tachypnea and respiratory distress present.     Breath sounds: Decreased air movement present. Rhonchi and rales present.     Comments: Agonal breathing Abdominal:     General: There is no distension.     Palpations: Abdomen is soft.     Tenderness: There is no abdominal tenderness.  Musculoskeletal:     Cervical back: Neck supple. No tenderness.     Right lower leg: No edema.     Left lower leg: No edema.  Skin:    General: Skin is  cool and dry.     Comments: Large necrotic wound to sacral area.  Neurological:     GCS: GCS eye subscore is 4. GCS verbal subscore is 1. GCS motor subscore is 6.     ED Results / Procedures / Treatments   Labs (all labs ordered are listed, but only abnormal results are displayed) Labs Reviewed  LACTIC ACID, PLASMA - Abnormal; Notable for the following components:      Result Value   Lactic Acid, Venous 5.9 (*)    All other components within normal limits  COMPREHENSIVE METABOLIC PANEL - Abnormal; Notable for the following components:   Sodium 134 (*)    Potassium 6.6 (*)    Chloride 92 (*)    Glucose, Bld 103 (*)    BUN 55 (*)    Creatinine, Ser 1.33 (*)    Calcium 8.5 (*)    Total Protein 4.7 (*)    Albumin <1.5 (*)    AST 99 (*)    ALT 61 (*)    GFR, Estimated 41 (*)    Anion gap 17 (*)    All other components within normal limits  CBC WITH DIFFERENTIAL/PLATELET - Abnormal; Notable for the following components:   RBC 3.60 (*)    Hemoglobin 11.5 (*)    MCV 101.9 (*)    RDW 16.0 (*)    nRBC 3.8 (*)    Abs Immature Granulocytes 0.12 (*)    All other components within normal limits  CBG MONITORING, ED - Abnormal; Notable for the following components:   Glucose-Capillary 68 (*)    All other  components within normal limits  I-STAT ARTERIAL BLOOD GAS, ED - Abnormal; Notable for the following components:   pH, Arterial 7.525 (*)    pO2, Arterial 505 (*)    Bicarbonate 28.9 (*)    Acid-Base Excess 6.0 (*)    Sodium 132 (*)    Potassium 5.4 (*)    Calcium, Ion 1.10 (*)    HCT 27.0 (*)    Hemoglobin 9.2 (*)    All other components within normal limits  I-STAT CHEM 8, ED - Abnormal; Notable for the following components:   Sodium 134 (*)    Potassium 5.5 (*)    Chloride 95 (*)    BUN 48 (*)    Creatinine, Ser 1.30 (*)    Glucose, Bld 118 (*)    Calcium, Ion 1.05 (*)    Hemoglobin 10.5 (*)    HCT 31.0 (*)    All other components within normal limits  TROPONIN I (HIGH SENSITIVITY) - Abnormal; Notable for the following components:   Troponin I (High Sensitivity) 205 (*)    All other components within normal limits  RESP PANEL BY RT-PCR (RSV, FLU A&B, COVID)  RVPGX2  CULTURE, BLOOD (ROUTINE X 2)  CULTURE, BLOOD (ROUTINE X 2)  MAGNESIUM  LACTIC ACID, PLASMA  URINALYSIS, W/ REFLEX TO CULTURE (INFECTION SUSPECTED)  PROTIME-INR  PATHOLOGIST SMEAR REVIEW  TROPONIN I (HIGH SENSITIVITY)    EKG EKG Interpretation  Date/Time:  Tuesday June 22 2022 09:41:36 EDT Ventricular Rate:  97 PR Interval:  147 QRS Duration: 162 QT Interval:  417 QTC Calculation: 530 R Axis:   -74 Text Interpretation: Sinus rhythm Probable left atrial enlargement Left bundle branch block Confirmed by Godfrey Pick ([id number]) on 06/22/2022 11:29:35 AM  Radiology CT CHEST ABDOMEN PELVIS WO CONTRAST  Result Date: 06/22/2022 CLINICAL DATA:  Sepsis, mental status change EXAM: CT CHEST, ABDOMEN AND PELVIS WITHOUT CONTRAST  TECHNIQUE: Multidetector CT imaging of the chest, abdomen and pelvis was performed following the standard protocol without IV contrast. RADIATION DOSE REDUCTION: This exam was performed according to the departmental dose-optimization program which includes automated exposure control, adjustment  of the mA and/or kV according to patient size and/or use of iterative reconstruction technique. COMPARISON:  01/01/2022 and previous FINDINGS: CT CHEST FINDINGS Cardiovascular: Right IJ port catheter extends to the cavoatrial junction. Heart size normal. No pericardial effusion. Coronary calcifications. Scattered aortic atheromatous calcifications without aneurysm. Mediastinum/Nodes: No mass or adenopathy. Endotracheal tube terminates 4.1 cm above carina. Nasogastric tube extends to decompressed stomach. Lungs/Pleura: No pleural effusion. No pneumothorax. 3.3 cm masslike consolidation in the medial basal segment right lower lobe, new since previous. Multiple pulmonary nodules in all lobes as before, largest 1.2 cm in the anterior right lower lobe (Im96,Se5) previously 1.4 cm ; on the left 1.1 cm in the lingula image 77 previously 0.9 cm. Poorly marginated airspace process in the posteromedial left lower lobe image 91 approximately 1.9 cm, new since previous. Musculoskeletal: Vertebral endplate spurring at multiple levels in the mid thoracic spine. CT ABDOMEN PELVIS FINDINGS Hepatobiliary: Innumerable hyperdense subcentimeter gallstones in the dependent aspect of the nondilated gallbladder. No discrete liver lesion or biliary ductal dilatation. Pancreas: Mild background parenchymal atrophy without ductal dilatation or regional inflammatory change. Poorly marginated 1.4 cm masslike area in the midbody with adjacent coarse calcification. 2.7 cm mass in the pancreatic tail, with adjacent retroperitoneal satellite nodules up to 1.7 cm. Spleen: Normal in size without focal abnormality. Adrenals/Urinary Tract: No adrenal mass. 4 mm calculus in the upper pole right renal collecting system. No hydronephrosis. 1.6 cm right upper pole probable renal cyst, stable since 07/06/2018; no follow-up recommended. Urinary bladder partially decompressed by Foley catheter. Stomach/Bowel: Nasogastric tube decompresses the stomach. The  small bowel is nondistended. Normal appendix. Colon is partially distended by gas and fecal material, with scattered diverticula; no adjacent inflammatory change. Vascular/Lymphatic: Scattered aortoiliac calcified atheromatous plaque without aneurysm. No abdominal or pelvic adenopathy. Reproductive: Status post hysterectomy. No adnexal masses. Other: No ascites.  No free air. Musculoskeletal: Small paraumbilical hernia containing only mesenteric fat. Subcentimeter bone islands in the right ilium and proximal left femur stable since 07/06/2018. Bilateral sacroiliitis. Mild spondylitic changes in the lumbar spine. No acute findings. IMPRESSION: 1. Airspace consolidation in the right lower lobe, possibly pneumonia. 2. Multiple pulmonary nodules in all lobes, consistent with metastatic disease, without convincing progression since previous exam. 3. Pancreatic body and tail masses with adjacent retroperitoneal satellite nodules, new since prior study. 4. Cholelithiasis. 5. Right nephrolithiasis without hydronephrosis. 6. Colonic diverticulosis. 7. Small paraumbilical hernia containing only mesenteric fat. 8.  Aortic Atherosclerosis (ICD10-I70.0). Electronically Signed   By: Lucrezia Europe M.D.   On: 06/22/2022 12:29   CT Head Wo Contrast  Result Date: 06/22/2022 CLINICAL DATA:  Mental status change of unknown cause. EXAM: CT HEAD WITHOUT CONTRAST TECHNIQUE: Contiguous axial images were obtained from the base of the skull through the vertex without intravenous contrast. RADIATION DOSE REDUCTION: This exam was performed according to the departmental dose-optimization program which includes automated exposure control, adjustment of the mA and/or kV according to patient size and/or use of iterative reconstruction technique. COMPARISON:  MRI 05/27/2022.  CT 05/26/2022. FINDINGS: Brain: Age related volume loss. No sign of acute infiltrate, mass, hemorrhage, hydrocephalus or extra-axial collection. Vascular: There is  atherosclerotic calcification of the major vessels at the base of the brain. Skull: Negative Sinuses/Orbits: Paranasal sinuses are clear.  Orbits appear  normal. Other: None IMPRESSION: No acute CT finding. Age related volume loss. Atherosclerotic calcification of the major vessels at the base of the brain. Electronically Signed   By: Nelson Chimes M.D.   On: 06/22/2022 12:13   DG Chest Portable 1 View  Result Date: 06/22/2022 CLINICAL DATA:  Intubation EXAM: PORTABLE CHEST 1 VIEW COMPARISON:  05/26/2022 FINDINGS: Cardiomegaly. Right chest port catheter. Interval placement of endotracheal and esophagogastric tubes, appropriately positioned tip over the midtrachea and tip and side port below the diaphragm. No new or acute appearing airspace opacity. Osseous structures unremarkable. IMPRESSION: 1. Interval placement of endotracheal and esophagogastric tubes, appropriately positioned tip over the midtrachea and tip and side port below the diaphragm. 2. No new or acute appearing airspace opacity. Electronically Signed   By: Delanna Ahmadi M.D.   On: 06/22/2022 10:28    Procedures Procedure Name: Intubation Date/Time: 06/22/2022 10:17 AM  Performed by: Godfrey Pick, MDPre-anesthesia Checklist: Patient identified, Emergency Drugs available, Patient being monitored and Suction available Oxygen Delivery Method: Ambu bag Preoxygenation: Pre-oxygenation with 100% oxygen Induction Type: Rapid sequence Ventilation: Mask ventilation without difficulty Laryngoscope Size: Glidescope and 4 Grade View: Grade I Tube size: 7.5 mm Number of attempts: 1 Airway Equipment and Method: Rigid stylet and Video-laryngoscopy Placement Confirmation: ETT inserted through vocal cords under direct vision, Positive ETCO2, CO2 detector and Breath sounds checked- equal and bilateral Secured at: 24 cm Tube secured with: ETT holder Dental Injury: Teeth and Oropharynx as per pre-operative assessment         Medications Ordered in  ED Medications  etomidate (AMIDATE) injection (10 mg Intravenous Given 06/22/22 0958)  rocuronium (ZEMURON) injection (90 mg Intravenous Given 06/22/22 1000)  fentaNYL 2580mg in NS 2520m(1078mml) infusion-PREMIX (25 mcg/hr Intravenous Bolus 06/22/22 1208)  fentaNYL (SUBLIMAZE) bolus via infusion 25-100 mcg (has no administration in time range)  lactated ringers infusion (has no administration in time range)  metroNIDAZOLE (FLAGYL) IVPB 500 mg (500 mg Intravenous New Bag/Given 06/22/22 1234)  dextrose 50 % solution 50 mL (0 mLs Intravenous Hold 06/22/22 1053)  vancomycin (VANCOREADY) IVPB 1500 mg/300 mL (has no administration in time range)  ceFEPIme (MAXIPIME) 2 g in sodium chloride 0.9 % 100 mL IVPB (has no administration in time range)  vancomycin (VANCOREADY) IVPB 750 mg/150 mL (has no administration in time range)  sodium chloride 0.9 % bolus 1,000 mL (1,000 mLs Intravenous New Bag/Given 06/22/22 1239)  insulin aspart (novoLOG) injection 5 Units (5 Units Intravenous Not Given 06/22/22 1144)    And  dextrose 50 % solution 50 mL (50 mLs Intravenous Not Given 06/22/22 1143)  norepinephrine (LEVOPHED) '4mg'$  in 250m42m.016 mg/mL) premix infusion (17 mcg/min Intravenous Rate/Dose Change 06/22/22 1237)  sodium chloride 0.9 % bolus 500 mL (has no administration in time range)  sodium chloride 0.9 % bolus 1,000 mL (0 mLs Intravenous Stopped 06/22/22 1143)  ceFEPIme (MAXIPIME) 2 g in sodium chloride 0.9 % 100 mL IVPB (0 g Intravenous Stopped 06/22/22 1143)  calcium gluconate 1 g/ 50 mL sodium chloride IVPB (1,000 mg Intravenous New Bag/Given 06/22/22 1230)  albuterol (PROVENTIL) (2.5 MG/3ML) 0.083% nebulizer solution 10 mg (10 mg Nebulization Given 06/22/22 1244)  hydrocortisone sodium succinate (SOLU-CORTEF) 100 MG injection 100 mg (100 mg Intravenous Given 06/22/22 1225)    ED Course/ Medical Decision Making/ A&P                             Medical Decision Making Amount and/or  Complexity of Data  Reviewed Labs: ordered. Radiology: ordered. ECG/medicine tests: ordered.  Risk OTC drugs. Prescription drug management.   This patient presents to the ED for concern of respiratory stress and altered mental status, this involves an extensive number of treatment options, and is a complaint that carries with it a high risk of complications and morbidity.  The differential diagnosis includes sepsis, trauma, polypharmacy, CVA, seizure   Co morbidities that complicate the patient evaluation  arthritis, asthma, DM, HTN, SBO, GERD, sleep apnea, HLD, remote breast cancer s/p lumpectomy, chronic hypoxic respiratory failure on 2 L at baseline, non-small cell lung cancer   Additional history obtained:  Additional history obtained from EMS External records from outside source obtained and reviewed including EMR   Lab Tests:  I Ordered, and personally interpreted labs.  The pertinent results include: Patient has a respiratory alkalosis on blood gas.  CMP shows AKI, hyperkalemia, mild elevation in transaminases.  Troponin is moderately elevated.  Initial lactic acid is significantly elevated at 5.9.   Imaging Studies ordered:  I ordered imaging studies including chest x-ray, CT of head, chest, abdomen, pelvis I independently visualized and interpreted imaging which showed chest x-ray shows proper placement of ETT and NG tube.  There is no clear evidence of pneumonia.  CT head showed no acute findings.  On CT of CAP, there were findings concerning for RLL pneumonia.  There was evidence of progression of metastatic disease to her lungs.  There were new pancreatic masses. I agree with the radiologist interpretation   Cardiac Monitoring: / EKG:  The patient was maintained on a cardiac monitor.  I personally viewed and interpreted the cardiac monitored which showed an underlying rhythm of: Sinus rhythm   Consultations Obtained:  I requested consultation with the critical care,  and discussed  lab and imaging findings as well as pertinent plan - they recommend: Admission to ICU   Problem List / ED Course / Critical interventions / Medication management  Patient presents for altered mental status and respiratory distress.  This was noted by nursing facility staff this morning.  On arrival in the ED, patient is awake but minimally responsive.  She is able to follow some commands.  She is not speaking at this time.  She has tachypnea with agonal breathing.  Extremities are cool to the touch.  Due to poor perfusion, pulse oximetry is inconsistent.  She has decreased air movement and coarse lung sounds on lung auscultation.  She has a large, foul-smelling, necrotic wound to her sacral area.  Despite cool extremities, she is normothermic on rectal temperature.  Patient was started on IV fluids.  I attempted to contact her daughter by telephone but no answer was obtained.  Given her current respiratory distress and decreased responsiveness, patient was intubated for airway protection.  She did have postintubation hypotension and Levophed was started.  Presentation is concerning for sepsis.  Likely sources are pulmonary and/or sacral wound.  Broad-spectrum antibiotics were initiated.  Patient required 7.5 mcg/min of Levophed.  Initial lab work is notable for hypokalemia and AKI.  Temporizing medications for hyperkalemia were ordered.  Initial lactic acid was elevated at 5.9.  Second liter of IV fluids were ordered.  Given her hypotension and hypoglycemia, patient was given stress dose steroid for empiric treatment of adrenal insufficiency.  CT scan showed findings consistent with RLL pneumonia and worsening metastatic, pulmonary disease.  There were also incidental findings of new pancreatic masses.  Patient was bolused fentanyl as needed for sedation.  Following bolus, she required increased dose of Levophed.  Levophed was increased to 17 mcg/min.  Patient was admitted to ICU for further management. I  ordered medication including IV fluids and broad-spectrum antibiotics for empiric treatment of sepsis; etomidate and rocuronium for RSI; fentanyl for postintubation sedation; calcium gluconate, albuterol, insulin/dextrose for temporization of hyperkalemia; Solu-Cortef form.  Treatment of adrenal insufficiency; Levophed for hypotension Reevaluation of the patient after these medicines showed that the patient improved I have reviewed the patients home medicines and have made adjustments as needed   Social Determinants of Health:  Resides in nursing facility  CRITICAL CARE Performed by: Godfrey Pick   Total critical care time: 45 minutes  Critical care time was exclusive of separately billable procedures and treating other patients.  Critical care was necessary to treat or prevent imminent or life-threatening deterioration.  Critical care was time spent personally by me on the following activities: development of treatment plan with patient and/or surrogate as well as nursing, discussions with consultants, evaluation of patient's response to treatment, examination of patient, obtaining history from patient or surrogate, ordering and performing treatments and interventions, ordering and review of laboratory studies, ordering and review of radiographic studies, pulse oximetry and re-evaluation of patient's condition.          Final Clinical Impression(s) / ED Diagnoses Final diagnoses:  Pneumonia of right lower lobe due to infectious organism  Septic shock ([place])  Respiratory distress  Encephalopathy acute    Rx / DC Orders ED Discharge Orders     None         Godfrey Pick, MD 06/22/22 1307

## 2022-06-22 NOTE — Progress Notes (Signed)
[facility] Progress Note Patient Name: Charlene Houston DOB: [birth year] MRN: [id number]   Date of Service  06/22/2022  HPI/Events of Note  Patient at limit of currently ordered pressors with marginal blood pressure. Patient with sub-optimal sedation on the ventilator.  eICU Interventions  Norepinephrine gtt ceiling increased to 60 mcg, Vasopressin gtt increased to 0.04 mcg. Albumin 5 % 250 ml iv fluid bolus x  1, PRN Versed ordered for sedation.        Frederik Pear 06/22/2022, 8:19 PM

## 2022-06-22 NOTE — Sepsis Progress Note (Signed)
Notified bedside nurse of need to draw repeat lactic acid. 

## 2022-06-22 NOTE — Consult Note (Signed)
WOC Nurse Consult Note: Reason for Consult:bilateral buttocks and ischial tuberosity full and partial thickness wounds, pressure injury (Unstageable) to sacrum. See photodocumentation of wounds provided to EMR by Provider. Wound type:pressure plus moisture Pressure Injury POA: Yes Measurement:To be obtained by Bedside RN and documented on Nursing Flow Sheet with next dressing change Wound bed: Nonviable tissue to wound base of PI, pink/yellow to all other MASD areas Drainage (amount, consistency, odor) serous, moderate Periwound: intact Dressing procedure/placement/frequency:Patient is on a mattress replacement with low air loss feature, is being turned and repositioned from side to side with the supine position being minimized. Heels are to be floated. Topical care will be to cleanse with NS, pat dry and cover/fill with sodium hypochlorite solution (Dakin's), moistened gauze, top with dry gauze and secure with ABD pads/paper tape.  If further, more aggressive wound care is desired, up to and including debridement, recommend consultation with Surgical colleagues.  [place] nursing team will not follow, but will remain available to this patient, the nursing and medical teams.  Please re-consult if needed.  Thank you for inviting [place] to participate in this patient's Plan of Care.  Maudie Flakes, MSN, RN, CNS, [place], Serita Grammes, [employer], [employer] phone:  [phone]

## 2022-06-22 NOTE — ED Notes (Signed)
Attempted to call [phone] for report

## 2022-06-22 NOTE — Progress Notes (Signed)
Pharmacy Antibiotic Note  Charlene Houston is a 77 y.o. female admitted on 06/22/2022 with sepsis.  Scr increased to 1.30 (baseline 0.90). Pharmacy has been consulted for vancomycin and cefepime dosing.  Plan: Start vancomycin 1500 mg, then 750 mg IV q24h. Estimated AUC 520.   Start cefepime 2g IV q12h.  Monitor for changes in renal function, CBC, vitals.  Height: '5\' 5"'$  (165.1 cm) Weight: 90.4 kg (199 lb 4.7 oz) IBW/kg (Calculated) : 57  Temp (24hrs), Avg:99.6 F (37.6 C), Min:99.6 F (37.6 C), Max:99.6 F (37.6 C)  Recent Labs  Lab 06/22/22 1044  CREATININE 1.30*    Estimated Creatinine Clearance: 40.9 mL/min (A) (by C-G formula based on SCr of 1.3 mg/dL (H)).    Allergies  Allergen Reactions   Prozac [Fluoxetine Hcl] Other (See Comments)    Jitters   Safflower Oil Other (See Comments)    Unknown reaction Documented on MAR    Antimicrobials this admission: Vanc 3/12 >>  Cefepime 3/12 >>   Dose adjustments this admission: N/A  Microbiology results: 3/12 BCx:   Thank you for allowing pharmacy to be a part of this patient's care.  Raynald Blend 06/22/2022 10:57 AM

## 2022-06-22 NOTE — Progress Notes (Signed)
[facility] Progress Note Patient Name: Charlene Houston DOB: 04/06/1946 MRN: [id number]   Date of Service  06/22/2022  HPI/Events of Note  K+ 6.8  eICU Interventions  Hyperkalemia treatment protocol orders entered.        Frederik Pear 06/22/2022, 10:43 PM

## 2022-06-22 NOTE — Sepsis Progress Note (Signed)
Code Sepsis protocol being monitored by eLink. 

## 2022-06-22 NOTE — Progress Notes (Signed)
RT assisted with patient transport on vent to CT and returned to ED [id number] without complications.

## 2022-06-22 NOTE — ED Notes (Signed)
MD notified of critical Troponin 205, lactic 5.9, potassium 6.6

## 2022-06-23 DIAGNOSIS — J123 Human metapneumovirus pneumonia: Secondary | ICD-10-CM

## 2022-06-23 DIAGNOSIS — J9601 Acute respiratory failure with hypoxia: Secondary | ICD-10-CM

## 2022-06-23 DIAGNOSIS — R7881 Bacteremia: Secondary | ICD-10-CM | POA: Insufficient documentation

## 2022-06-23 DIAGNOSIS — R6521 Severe sepsis with septic shock: Secondary | ICD-10-CM | POA: Diagnosis not present

## 2022-06-23 DIAGNOSIS — A419 Sepsis, unspecified organism: Secondary | ICD-10-CM | POA: Diagnosis not present

## 2022-06-23 LAB — BLOOD CULTURE ID PANEL (REFLEXED) - BCID2
A.calcoaceticus-baumannii: NOT DETECTED
Bacteroides fragilis: NOT DETECTED
CTX-M ESBL: NOT DETECTED
Candida albicans: NOT DETECTED
Candida auris: NOT DETECTED
Candida glabrata: NOT DETECTED
Candida krusei: NOT DETECTED
Candida parapsilosis: NOT DETECTED
Candida tropicalis: NOT DETECTED
Carbapenem resist OXA 48 LIKE: NOT DETECTED
Carbapenem resistance IMP: NOT DETECTED
Carbapenem resistance KPC: NOT DETECTED
Carbapenem resistance NDM: NOT DETECTED
Carbapenem resistance VIM: NOT DETECTED
Cryptococcus neoformans/gattii: NOT DETECTED
Enterobacter cloacae complex: NOT DETECTED
Enterobacterales: DETECTED — AB
Enterococcus Faecium: NOT DETECTED
Enterococcus faecalis: DETECTED — AB
Escherichia coli: NOT DETECTED
Haemophilus influenzae: NOT DETECTED
Klebsiella aerogenes: NOT DETECTED
Klebsiella oxytoca: NOT DETECTED
Klebsiella pneumoniae: NOT DETECTED
Listeria monocytogenes: NOT DETECTED
Methicillin resistance mecA/C: DETECTED — AB
Neisseria meningitidis: NOT DETECTED
Proteus species: DETECTED — AB
Pseudomonas aeruginosa: NOT DETECTED
Salmonella species: NOT DETECTED
Serratia marcescens: NOT DETECTED
Staphylococcus aureus (BCID): NOT DETECTED
Staphylococcus epidermidis: DETECTED — AB
Staphylococcus lugdunensis: NOT DETECTED
Staphylococcus species: DETECTED — AB
Stenotrophomonas maltophilia: NOT DETECTED
Streptococcus agalactiae: NOT DETECTED
Streptococcus pneumoniae: NOT DETECTED
Streptococcus pyogenes: NOT DETECTED
Streptococcus species: NOT DETECTED
Vancomycin resistance: NOT DETECTED

## 2022-06-23 LAB — GLUCOSE, CAPILLARY
Glucose-Capillary: 145 mg/dL — ABNORMAL HIGH (ref 70–99)
Glucose-Capillary: 201 mg/dL — ABNORMAL HIGH (ref 70–99)
Glucose-Capillary: 206 mg/dL — ABNORMAL HIGH (ref 70–99)
Glucose-Capillary: 206 mg/dL — ABNORMAL HIGH (ref 70–99)
Glucose-Capillary: 207 mg/dL — ABNORMAL HIGH (ref 70–99)
Glucose-Capillary: 209 mg/dL — ABNORMAL HIGH (ref 70–99)
Glucose-Capillary: 217 mg/dL — ABNORMAL HIGH (ref 70–99)
Glucose-Capillary: 275 mg/dL — ABNORMAL HIGH (ref 70–99)

## 2022-06-23 LAB — COMPREHENSIVE METABOLIC PANEL
ALT: 163 U/L — ABNORMAL HIGH (ref 0–44)
AST: 233 U/L — ABNORMAL HIGH (ref 15–41)
Albumin: 1.9 g/dL — ABNORMAL LOW (ref 3.5–5.0)
Alkaline Phosphatase: 103 U/L (ref 38–126)
Anion gap: 14 (ref 5–15)
BUN: 55 mg/dL — ABNORMAL HIGH (ref 8–23)
CO2: 21 mmol/L — ABNORMAL LOW (ref 22–32)
Calcium: 8.4 mg/dL — ABNORMAL LOW (ref 8.9–10.3)
Chloride: 98 mmol/L (ref 98–111)
Creatinine, Ser: 1.47 mg/dL — ABNORMAL HIGH (ref 0.44–1.00)
GFR, Estimated: 37 mL/min — ABNORMAL LOW (ref >60)
Glucose, Bld: 215 mg/dL — ABNORMAL HIGH (ref 70–99)
Potassium: 5.7 mmol/L — ABNORMAL HIGH (ref 3.5–5.1)
Sodium: 133 mmol/L — ABNORMAL LOW (ref 135–145)
Total Bilirubin: 2 mg/dL — ABNORMAL HIGH (ref 0.3–1.2)
Total Protein: 4.8 g/dL — ABNORMAL LOW (ref 6.5–8.1)

## 2022-06-23 LAB — BASIC METABOLIC PANEL
Anion gap: 16 — ABNORMAL HIGH (ref 5–15)
BUN: 57 mg/dL — ABNORMAL HIGH (ref 8–23)
CO2: 22 mmol/L (ref 22–32)
Calcium: 8.4 mg/dL — ABNORMAL LOW (ref 8.9–10.3)
Chloride: 96 mmol/L — ABNORMAL LOW (ref 98–111)
Creatinine, Ser: 1.44 mg/dL — ABNORMAL HIGH (ref 0.44–1.00)
GFR, Estimated: 38 mL/min — ABNORMAL LOW (ref >60)
Glucose, Bld: 185 mg/dL — ABNORMAL HIGH (ref 70–99)
Potassium: 6.7 mmol/L (ref 3.5–5.1)
Sodium: 134 mmol/L — ABNORMAL LOW (ref 135–145)

## 2022-06-23 LAB — POCT I-STAT 7, (LYTES, BLD GAS, ICA,H+H)
Acid-base deficit: 1 mmol/L (ref 0.0–2.0)
Bicarbonate: 22.7 mmol/L (ref 20.0–28.0)
Calcium, Ion: 1.1 mmol/L — ABNORMAL LOW (ref 1.15–1.40)
HCT: 24 % — ABNORMAL LOW (ref 36.0–46.0)
Hemoglobin: 8.2 g/dL — ABNORMAL LOW (ref 12.0–15.0)
O2 Saturation: 99 %
Patient temperature: 99
Potassium: 4.5 mmol/L (ref 3.5–5.1)
Sodium: 132 mmol/L — ABNORMAL LOW (ref 135–145)
TCO2: 24 mmol/L (ref 22–32)
pCO2 arterial: 32.8 mmHg (ref 32–48)
pH, Arterial: 7.448 (ref 7.35–7.45)
pO2, Arterial: 149 mmHg — ABNORMAL HIGH (ref 83–108)

## 2022-06-23 LAB — MAGNESIUM
Magnesium: 1.7 mg/dL (ref 1.7–2.4)
Magnesium: 1.7 mg/dL (ref 1.7–2.4)
Magnesium: 1.6 mg/dL — ABNORMAL LOW (ref 1.7–2.4)

## 2022-06-23 LAB — CBC
HCT: 36.5 % (ref 36.0–46.0)
Hemoglobin: 11.4 g/dL — ABNORMAL LOW (ref 12.0–15.0)
MCH: 31.2 pg (ref 26.0–34.0)
MCHC: 31.2 g/dL (ref 30.0–36.0)
MCV: 100 fL (ref 80.0–100.0)
Platelets: 32 10*3/uL — ABNORMAL LOW (ref 150–400)
RBC: 3.65 MIL/uL — ABNORMAL LOW (ref 3.87–5.11)
RDW: 15.9 % — ABNORMAL HIGH (ref 11.5–15.5)
WBC: 7.6 10*3/uL (ref 4.0–10.5)
nRBC: 5.3 % — ABNORMAL HIGH (ref 0.0–0.2)

## 2022-06-23 LAB — URINE CULTURE

## 2022-06-23 LAB — POTASSIUM
Potassium: 5.1 mmol/L (ref 3.5–5.1)
Potassium: 5.8 mmol/L — ABNORMAL HIGH (ref 3.5–5.1)
Potassium: 5.4 mmol/L — ABNORMAL HIGH (ref 3.5–5.1)
Potassium: 6.2 mmol/L — ABNORMAL HIGH (ref 3.5–5.1)
Potassium: 4 mmol/L (ref 3.5–5.1)

## 2022-06-23 LAB — PATHOLOGIST SMEAR REVIEW

## 2022-06-23 LAB — PHOSPHORUS
Phosphorus: 3.5 mg/dL (ref 2.5–4.6)
Phosphorus: 3.2 mg/dL (ref 2.5–4.6)
Phosphorus: 3.1 mg/dL (ref 2.5–4.6)

## 2022-06-23 LAB — STREP PNEUMONIAE URINARY ANTIGEN: Strep Pneumo Urinary Antigen: NEGATIVE

## 2022-06-23 LAB — CORTISOL: Cortisol, Plasma: >100 ug/dL

## 2022-06-23 MED ORDER — FAMOTIDINE 20 MG PO TABS
20.0000 mg | ORAL_TABLET | Freq: Every day | ORAL | Status: DC
  Administered 2022-06-24: 20 mg
  Filled 2022-06-23: qty 1

## 2022-06-23 MED ORDER — VITAL 1.5 CAL PO LIQD
1000.0000 mL | ORAL | Status: DC
  Administered 2022-06-23 – 2022-06-25 (×3): 1000 mL

## 2022-06-23 MED ORDER — SODIUM CHLORIDE 0.9 % IV SOLN: 500.0000 mg | INTRAVENOUS | Status: DC

## 2022-06-23 MED ORDER — SODIUM CHLORIDE 0.9 % IV SOLN
100.0000 mg | Freq: Two times a day (BID) | INTRAVENOUS | Status: DC
  Administered 2022-06-23 (×2): 100 mg via INTRAVENOUS
  Filled 2022-06-23 (×3): qty 100

## 2022-06-23 MED ORDER — MAGNESIUM SULFATE 2 GM/50ML IV SOLN
2.0000 g | Freq: Once | INTRAVENOUS | Status: AC
  Administered 2022-06-23: 2 g via INTRAVENOUS
  Filled 2022-06-23: qty 50

## 2022-06-23 MED ORDER — PROSOURCE TF20 ENFIT COMPATIBL EN LIQD
60.0000 mL | Freq: Every day | ENTERAL | Status: DC
  Administered 2022-06-23 – 2022-06-26 (×4): 60 mL
  Filled 2022-06-23 (×4): qty 60

## 2022-06-23 MED ORDER — INSULIN ASPART 100 UNIT/ML IJ SOLN
0.0000 [IU] | INTRAMUSCULAR | Status: DC
  Administered 2022-06-23: 5 [IU] via SUBCUTANEOUS
  Administered 2022-06-23: 2 [IU] via SUBCUTANEOUS
  Administered 2022-06-23: 5 [IU] via SUBCUTANEOUS
  Administered 2022-06-24 (×3): 8 [IU] via SUBCUTANEOUS

## 2022-06-23 MED ORDER — VANCOMYCIN HCL 1250 MG/250ML IV SOLN: 1250.0000 mg | INTRAVENOUS | Status: DC

## 2022-06-23 MED ORDER — DEXTROSE 50 % IV SOLN
1.0000 | Freq: Once | INTRAVENOUS | Status: AC
  Administered 2022-06-23: 50 mL via INTRAVENOUS

## 2022-06-23 MED ORDER — VITAL HIGH PROTEIN PO LIQD
1000.0000 mL | ORAL | Status: DC
  Administered 2022-06-23: 1000 mL

## 2022-06-23 MED ORDER — ADULT MULTIVITAMIN W/MINERALS CH
1.0000 | ORAL_TABLET | Freq: Every day | ORAL | Status: DC
  Administered 2022-06-23 – 2022-06-26 (×4): 1
  Filled 2022-06-23 (×4): qty 1

## 2022-06-23 MED ORDER — VITAL HIGH PROTEIN PO LIQD
1000.0000 mL | ORAL | Status: AC
  Administered 2022-06-23: 1000 mL

## 2022-06-23 MED ORDER — INSULIN ASPART 100 UNIT/ML IV SOLN
5.0000 [IU] | Freq: Once | INTRAVENOUS | Status: AC
  Administered 2022-06-23: 5 [IU] via INTRAVENOUS

## 2022-06-23 MED ORDER — CHLORHEXIDINE GLUCONATE CLOTH 2 % EX PADS
6.0000 | MEDICATED_PAD | Freq: Every day | CUTANEOUS | Status: DC
  Administered 2022-06-24 – 2022-06-25 (×3): 6 via TOPICAL

## 2022-06-23 NOTE — Progress Notes (Signed)
An USGPIV (ultrasound guided PIV) has been placed for short-term vasopressor infusion. A correctly placed ivWatch must be used when administering Vasopressors. Should this treatment be needed beyond 72 hours, central line access should be obtained.  It will be the responsibility of the bedside nurse to follow best practice to prevent extravasations.   ?

## 2022-06-23 NOTE — Progress Notes (Signed)
NAME:  Charlene Houston, MRN:  [id number], DOB:  1945-08-31, LOS: 1 ADMISSION DATE:  06/22/2022, CONSULTATION DATE:  06/23/22 REFERRING MD:  EDP  CHIEF COMPLAINT:  shortness of breath   History of Present Illness:  Charlene Houston is a 77 y.o. 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  She developed pneumonitis and treatment was discontinued.  She subsequently had a decline in baseline functional status and has been residing in nursing facility with recent admission for  Klebsiella UTI.  She presented to the ED on 3/12 with respiratory distress and altered mental status.  She was intubated on arrival to the ED, work-up significant for RLL pneumonia on CT and lactic acid of 5.9, K 6.6, creatinine 1.3, ALT 99, AST 61, WBC 6.8, troponin 205, pH 7.5, pO2 505.  She was treated with IVF and broad spectrum antibiotics and required Levophed for blood pressure support. PCCM consulted for admission.   She has a known sacral decubitus ulcer, no gas tracking or abscess on CT.  Pertinent  Medical History   has a past medical history of Arthritis, Asthma, Breast cancer ([place]), Diabetes mellitus without complication ([place]), Hypertension, and SBO (small bowel obstruction) ([place]) (06/2018).   Significant Hospital Events: Including procedures, antibiotic start and stop dates in addition to other pertinent events   3/12 admit to PCCM with septic shock on levophed   Interim History / Subjective:   No acute events overnight Remains sedated, intubated and on pressors  Objective   Blood pressure (!) 154/100, pulse (!) 104, temperature 99.9 F (37.7 C), resp. rate (!) 25, height '5\' 5"'$  (1.651 m), weight 83.5 kg, SpO2 100 %.    Vent Mode: PRVC FiO2 (%):  [40 %-100 %] 40 % Set Rate:  [25 bmp] 25 bmp Vt Set:  [450 mL] 450 mL PEEP:  [5 cmH20] 5 cmH20 Plateau  Pressure:  [23 cmH20-30 cmH20] 25 cmH20   Intake/Output Summary (Last 24 hours) at 06/23/2022 [id number] Last data filed at 06/23/2022 0700 Gross per 24 hour  Intake 3548.04 ml  Output 255 ml  Net 3293.04 ml   Filed Weights   06/22/22 0953 06/22/22 1530  Weight: 90.4 kg 83.5 kg    General:  chronically ill-appearing woman, intubated and sedated  HEENT: MM pink/moist, sclera anicteric Neuro: examined on fentanyl 187mg, PERRL, triggering vent CV: s1s2 rrr, no m/r/g PULM: course breath sounds, no wheezing GI/GU: soft, non-tender, indwelling foley catheter Extremities: warm/dry, 2+ pitting edema  Skin: no rashes or lesions, sacral decub as previously documented below      Resolved Hospital Problem list     Assessment & Plan:   Acute Hypoxic Respiratory Failure Secondary to R-sided pneumonia  Possible UTI -Maintain full vent support with SAT/SBT as tolerated -titrate Vent setting to maintain SpO2 greater than or equal to 90%. -HOB elevated 30 degrees. -Plateau pressures less than 30 cm H20.  -Follow chest x-ray, ABG prn.   -Bronchial hygiene and RT/bronchodilator protocol.  Septic Shock -continue vancomycin and cefepime. Add azithromycin for atypical coverage. -follow blood and urine cultures, respiratory culture, RVP, urine strep and legionella -received 2.5L IVF, continue maintenance and trend lactic -continue levophed to maintain MAP >65, add vasopressin  - Will consider adding stress dose steroids today, check random cortisol level  Chronic HFrEF with LBBB Elevated troponin -elevated to ~200, EKG with  LBBB though not new -trend troponin, consider repeat echo if rising, last was two months ago  Type 2 DM -SSI  AKI with hyperkalemia  Acute Metabolic Acidosis/lactic Acidosis Baseline creatinine .8-.9, 1.3 on admission -likely secondary to shock -follow BMP and UOP, avoid nephrotoxins  Elevated transaminases  Likely secondary to shock liver -cholelithiasis noted  on CT, if worsening consider RUQ [place]  Decubitus Ulcer, POA - wound care management  Best Practice (right click and "Reselect all SmartList Selections" daily)   Diet/type: tubefeeds DVT prophylaxis: prophylactic heparin  GI prophylaxis: PPI Lines: N/A Foley:  Yes, and it is still needed Code Status:  DNR Last date of multidisciplinary goals of care discussion Charlene Houston try to reach family today]  Labs   CBC: Recent Labs  Lab 06/22/22 1005 06/22/22 1044 06/22/22 1106 06/23/22 0206 06/23/22 0504  WBC 6.8  --   --  7.6  --   NEUTROABS 5.4  --   --   --   --   HGB 11.5* 10.5* 9.2* 11.4* 8.2*  HCT 36.7 31.0* 27.0* 36.5 24.0*  MCV 101.9*  --   --  100.0  --   PLT PLATELET CLUMPS NOTED ON SMEAR, COUNT APPEARS DECREASED  --   --  32*  --     Basic Metabolic Panel: Recent Labs  Lab 06/22/22 1005 06/22/22 1044 06/22/22 1106 06/22/22 2104 06/22/22 2325 06/23/22 0206 06/23/22 0504 06/23/22 0622  NA 134* 134* 132* 132* 134* 133* 132*  --   K 6.6* 5.5* 5.4* 6.8* 6.7* 5.7* 4.5 5.1  CL 92* 95*  --  95* 96* 98  --   --   CO2 25  --   --  21* 22 21*  --   --   GLUCOSE 103* 118*  --  221* 185* 215*  --   --   BUN 55* 48*  --  57* 57* 55*  --   --   CREATININE 1.33* 1.30*  --  1.51* 1.44* 1.47*  --   --   CALCIUM 8.5*  --   --  8.2* 8.4* 8.4*  --   --   MG 2.0  --   --   --   --  1.7  --   --   PHOS  --   --   --   --   --  3.5  --   --    GFR: Estimated Creatinine Clearance: 34.7 mL/min (A) (by C-G formula based on SCr of 1.47 mg/dL (H)). Recent Labs  Lab 06/22/22 1005 06/22/22 1536 06/23/22 0206  WBC 6.8  --  7.6  LATICACIDVEN 5.9* 3.6*  --     Liver Function Tests: Recent Labs  Lab 06/22/22 1005 06/23/22 0206  AST 99* 233*  ALT 61* 163*  ALKPHOS 116 103  BILITOT 1.1 2.0*  PROT 4.7* 4.8*  ALBUMIN <1.5* 1.9*   No results for input(s): "LIPASE", "AMYLASE" in the last 168 hours. No results for input(s): "AMMONIA" in the last 168 hours.  ABG    Component Value  Date/Time   PHART 7.448 06/23/2022 0504   PCO2ART 32.8 06/23/2022 0504   PO2ART 149 (H) 06/23/2022 0504   HCO3 22.7 06/23/2022 0504   TCO2 24 06/23/2022 0504   ACIDBASEDEF 1.0 06/23/2022 0504   O2SAT 99 06/23/2022 0504     Coagulation Profile: No results for input(s): "INR", "PROTIME" in the last 168 hours.  Cardiac Enzymes: No results for input(s): "CKTOTAL", "CKMB", "CKMBINDEX", "TROPONINI" in the last 168  hours.  HbA1C: Hgb A1c MFr Bld  Date/Time Value Ref Range Status  05/26/2022 06:04 PM 8.3 (H) 4.8 - 5.6 % Final    Comment:    (NOTE) Pre diabetes:          5.7%-6.4%  Diabetes:              >6.4%  Glycemic control for   <7.0% adults with diabetes   01/02/2022 03:46 AM 6.0 (H) 4.8 - 5.6 % Final    Comment:    (NOTE) Pre diabetes:          5.7%-6.4%  Diabetes:              >6.4%  Glycemic control for   <7.0% adults with diabetes     CBG: Recent Labs  Lab 06/22/22 2101 06/22/22 2301 06/23/22 0014 06/23/22 0308 06/23/22 0610  GLUCAP 183* 189* 217* 207* 206*    Critical care time: 45 minutes     CRITICAL CARE Performed by: Freddi Starr   Total critical care time: 45 minutes  Critical care time was exclusive of separately billable procedures and treating other patients.  Critical care was necessary to treat or prevent imminent or life-threatening deterioration.  Critical care was time spent personally by me on the following activities: development of treatment plan with patient and/or surrogate as well as nursing, discussions with consultants, evaluation of patient's response to treatment, examination of patient, obtaining history from patient or surrogate, ordering and performing treatments and interventions, ordering and review of laboratory studies, ordering and review of radiographic studies, pulse oximetry and re-evaluation of patient's condition.   Freda Jackson, MD [facility] Office: [phone]   See Amion  for personal pager PCCM on call pager [phone] until 7pm. Please call Elink 7p-7a. [phone]

## 2022-06-23 NOTE — Progress Notes (Signed)
[facility] Progress Note Patient Name: Charlene Houston DOB: [birth year] MRN: [id number]   Date of Service  06/23/2022  HPI/Events of Note  K+ 6.2 but specimen is hemolyzed.  eICU Interventions  Stat BMP ordered.        Kerry Kass Keyonte [place] 06/23/2022, 10:59 PM

## 2022-06-23 NOTE — Consult Note (Addendum)
WOC consult requested; this was already performed yesterday.  Please refer to previous consult notes, and topical treatment orders have been provided for bedside nurses to perform. Please re-consult if further assistance is needed.  Thank-you,  Julien Girt MSN, [place], [place], [place], [place]

## 2022-06-23 NOTE — Consult Note (Signed)
[facility] for Infectious Disease    Date of Admission:  06/22/2022     Total days of antibiotics 2               Reason for Consult: Enterococcus Bacteremia  Referring Provider: Tivis Ringer / Auto consult  Primary Care Provider: Lilian Coma., MD   ASSESSMENT:  Ms. Bedrick is a 77 y/o female with Stage IIIa right breast cancer and non-small cell lung cancer with metastasis and recent hospital admission for metabolic encephalopathy presenting with altered mental status and respiratory distress requiring intubation for airway protection and now with polymicrobial blood cultures with Enterococcus faecalis, MRSE and Proteus species. Source of bacterial infection is unclear although not limited due sacral wound or implanted port amongst others .Respiratory panel with metapneumovirus likely contributing but unlikely to be a major contributor to current picture.  Consider imaging of sacrum to rule out osteomyelitis. Depending upon goals of care would recommend that she have the port removed in the setting of Enterococcus bacteremia. Repeat blood cultures for clearance of bacteremia. TTE to check for endocarditis. Continue with vancomycin and cefepime while awaiting culture results and sensitivities. Remaining medical and supportive care per PCCM.   PLAN:  Continue current dose of vancomycin and cefepime. Monitor blood culture for clearance of bacteremia. TTE to check for endocarditis. Consider MRI of sacrum to check for osteomyelitis. Pending goals of care would recommend port removal as it is likely infected Therapeutic drug monitoring of renal function and vancomycin levels per protocol.  Remaining medical and supportive care per primary team.    Principal Problem:   Respiratory failure ([place]) Active Problems:   Bacteremia   Pneumonia of right lower lobe due to infectious organism   Septic shock (HCC)    Chlorhexidine Gluconate Cloth  6 each Topical Daily   [START ON 06/24/2022]  famotidine  20 mg Per Tube Daily   feeding supplement (PROSource TF20)  60 mL Per Tube Daily   heparin  5,000 Units Subcutaneous Q8H   insulin aspart  0-15 Units Subcutaneous Q4H   multivitamin with minerals  1 tablet Per Tube Daily   mouth rinse  15 mL Mouth Rinse Q2H   sodium hypochlorite   Topical BID   sodium zirconium cyclosilicate  10 g Per Tube Daily     HPI: Sylinda Contino is a 77 y.o. female with previous medical history significant for diabetes, hypertension, Stage IIIa right breast cancer and non-small cell lung cancer with metastasis presenting to the ED from her skilled nursing facility with respiratory distress and altered mental status.   Ms. Joens was recently admitted from 05/26/22-06/01/22 with acute metabolic encephalopathy with no clear source and concern for possible UTI with urine cultures showing Klebsiella pneumoniae with two different isolates present. Additional workup for seizure and other imaging was negative. Treated with 5 days of ceftriaxone and gradually improved.   Ms. Rusek arrived from her skilled nursing facility on 06/22/22 with respiratory distress and altered mental status and was minimally responsive. Has a large necrotic wound on her sacrum that was foul smelling. Required intubation for airway protection. Chest x-ray with cardiomegaly and low lung volumes and atelectasis. CT head and MRI brain were unremarkable. CT chest/abdomen/pelvis with airspace consolidation in the right lower lobe - possibly pneumonia; multiple pulmonary nodules in all lobes consistent with metastatic disease; pancreatic body and tail masses with adjacent retroperitoneal satellite nodules which was new; and right nephrolithiasis without hydronephrosis. Started on vancomycin, cefepime and metronidazole.  Ms. Allegra has had elevated temperatures with max of 100.2 F in the last 24 hours.  Blood cultures were positive for polymicrobial bacteremia with Enterococcus faecalis, MRSE,  and Proteus species. She has an implanted port of the right chest. She is currently intubated, sedated and on vasopressors.    Review of Systems: Review of Systems  Unable to perform ROS: Intubated     Past Medical History:  Diagnosis Date   Arthritis    Asthma    Breast cancer ([place])    Diabetes mellitus without complication ([place])    Hypertension    SBO (small bowel obstruction) ([place]) 06/2018    Social History   Tobacco Use   Smoking status: Former    Types: Cigarettes    Quit date: 1995    Years since quitting: 29.2   Smokeless tobacco: Never  Vaping Use   Vaping Use: Never used  Substance Use Topics   Alcohol use: Not Currently   Drug use: Not Currently    Family History  Problem Relation Age of Onset   Hypertension Mother    Diabetes Mother    Cancer Father     Allergies  Allergen Reactions   Prozac [Fluoxetine Hcl] Other (See Comments)    Jitters   Safflower Oil Other (See Comments)    Unknown reaction Documented on MAR    OBJECTIVE: Blood pressure 118/66, pulse 96, temperature 100.2 F (37.9 C), resp. rate (!) 25, height '5\' 5"'$  (1.651 m), weight 87.2 kg, SpO2 100 %.  Physical Exam Constitutional:      General: She is not in acute distress.    Appearance: She is well-developed.     Interventions: She is sedated and intubated.  Cardiovascular:     Rate and Rhythm: Regular rhythm. Tachycardia present.     Heart sounds: Normal heart sounds.  Pulmonary:     Effort: Pulmonary effort is normal. She is intubated.     Breath sounds: Normal breath sounds.  Skin:    General: Skin is warm and dry.  Neurological:     Mental Status: She is oriented to person, place, and time.  Psychiatric:        Behavior: Behavior normal.        Thought Content: Thought content normal.        Judgment: Judgment normal.     Lab Results Lab Results  Component Value Date   WBC 7.6 06/23/2022   HGB 8.2 (L) 06/23/2022   HCT 24.0 (L) 06/23/2022   MCV 100.0 06/23/2022    PLT 32 (L) 06/23/2022    Lab Results  Component Value Date   CREATININE 1.47 (H) 06/23/2022   BUN 55 (H) 06/23/2022   NA 132 (L) 06/23/2022   K 5.8 (H) 06/23/2022   CL 98 06/23/2022   CO2 21 (L) 06/23/2022    Lab Results  Component Value Date   ALT 163 (H) 06/23/2022   AST 233 (H) 06/23/2022   ALKPHOS 103 06/23/2022   BILITOT 2.0 (H) 06/23/2022     Microbiology: Recent Results (from the past 240 hour(s))  Blood Culture (routine x 2)     Status: None (Preliminary result)   Collection Time: 06/22/22 10:05 AM   Specimen: BLOOD  Result Value Ref Range Status   Specimen Description BLOOD LEFT ANTECUBITAL  Final   Special Requests   Final    BOTTLES DRAWN AEROBIC AND ANAEROBIC Blood Culture results may not be optimal due to an inadequate volume of blood received in culture bottles  Culture  Setup Time   Final    GRAM NEGATIVE RODS GRAM POSITIVE COCCI IN BOTH AEROBIC AND ANAEROBIC BOTTLES CRITICAL RESULT CALLED TO, READ BACK BY AND VERIFIED WITH: PHARMD G. ABBOTT 06/23/22 @ 0210 BY AB    Culture   Final    GRAM NEGATIVE RODS GRAM POSITIVE COCCI CULTURE REINCUBATED FOR BETTER GROWTH Performed at [facility], [place] [address], [place], [place] [address]    Report Status PENDING  Incomplete  Blood Culture ID Panel (Reflexed)     Status: Abnormal   Collection Time: 06/22/22 10:05 AM  Result Value Ref Range Status   Enterococcus faecalis DETECTED (A) NOT DETECTED Final    Comment: CRITICAL RESULT CALLED TO, READ BACK BY AND VERIFIED WITH: PHARMD G. ABBOTT 06/23/22 @ 0210 BY AB    Enterococcus Faecium NOT DETECTED NOT DETECTED Final   Listeria monocytogenes NOT DETECTED NOT DETECTED Final   Staphylococcus species DETECTED (A) NOT DETECTED Final    Comment: CRITICAL RESULT CALLED TO, READ BACK BY AND VERIFIED WITH: PHARMD G. ABBOTT 06/23/22 @ 0210 BY AB    Staphylococcus aureus (BCID) NOT DETECTED NOT DETECTED Final   Staphylococcus epidermidis DETECTED (A) NOT  DETECTED Final    Comment: Methicillin (oxacillin) resistant coagulase negative staphylococcus. Possible blood culture contaminant (unless isolated from more than one blood culture draw or clinical case suggests pathogenicity). No antibiotic treatment is indicated for blood  culture contaminants. CRITICAL RESULT CALLED TO, READ BACK BY AND VERIFIED WITH: PHARMD G. ABBOTT 06/23/22 @ 0210 BY AB    Staphylococcus lugdunensis NOT DETECTED NOT DETECTED Final   Streptococcus species NOT DETECTED NOT DETECTED Final   Streptococcus agalactiae NOT DETECTED NOT DETECTED Final   Streptococcus pneumoniae NOT DETECTED NOT DETECTED Final   Streptococcus pyogenes NOT DETECTED NOT DETECTED Final   A.calcoaceticus-baumannii NOT DETECTED NOT DETECTED Final   Bacteroides fragilis NOT DETECTED NOT DETECTED Final   Enterobacterales DETECTED (A) NOT DETECTED Final    Comment: Enterobacterales represent a large order of gram negative bacteria, not a single organism. CRITICAL RESULT CALLED TO, READ BACK BY AND VERIFIED WITH: PHARMD G. ABBOTT 06/23/22 @ 0210 BY AB    Enterobacter cloacae complex NOT DETECTED NOT DETECTED Final   Escherichia coli NOT DETECTED NOT DETECTED Final   Klebsiella aerogenes NOT DETECTED NOT DETECTED Final   Klebsiella oxytoca NOT DETECTED NOT DETECTED Final   Klebsiella pneumoniae NOT DETECTED NOT DETECTED Final   Proteus species DETECTED (A) NOT DETECTED Final    Comment: CRITICAL RESULT CALLED TO, READ BACK BY AND VERIFIED WITH: PHARMD G. ABBOTT 06/23/22 @ 0210 BY AB    Salmonella species NOT DETECTED NOT DETECTED Final   Serratia marcescens NOT DETECTED NOT DETECTED Final   Haemophilus influenzae NOT DETECTED NOT DETECTED Final   Neisseria meningitidis NOT DETECTED NOT DETECTED Final   Pseudomonas aeruginosa NOT DETECTED NOT DETECTED Final   Stenotrophomonas maltophilia NOT DETECTED NOT DETECTED Final   Candida albicans NOT DETECTED NOT DETECTED Final   Candida auris NOT DETECTED  NOT DETECTED Final   Candida glabrata NOT DETECTED NOT DETECTED Final   Candida krusei NOT DETECTED NOT DETECTED Final   Candida parapsilosis NOT DETECTED NOT DETECTED Final   Candida tropicalis NOT DETECTED NOT DETECTED Final   Cryptococcus neoformans/gattii NOT DETECTED NOT DETECTED Final   CTX-M ESBL NOT DETECTED NOT DETECTED Final   Carbapenem resistance IMP NOT DETECTED NOT DETECTED Final   Carbapenem resistance KPC NOT DETECTED NOT DETECTED Final   Methicillin  resistance mecA/C DETECTED (A) NOT DETECTED Final    Comment: CRITICAL RESULT CALLED TO, READ BACK BY AND VERIFIED WITH: PHARMD G. ABBOTT 06/23/22 @ 0210 BY AB    Carbapenem resistance NDM NOT DETECTED NOT DETECTED Final   Carbapenem resist OXA 48 LIKE NOT DETECTED NOT DETECTED Final   Vancomycin resistance NOT DETECTED NOT DETECTED Final   Carbapenem resistance VIM NOT DETECTED NOT DETECTED Final    Comment: Performed at [facility], [place] [address], [place], [place] [address]  Resp panel by RT-PCR (RSV, Flu A&B, Covid) Anterior Nasal Swab     Status: None   Collection Time: 06/22/22 10:07 AM   Specimen: Anterior Nasal Swab  Result Value Ref Range Status   SARS Coronavirus 2 by RT PCR NEGATIVE NEGATIVE Final   Influenza A by PCR NEGATIVE NEGATIVE Final   Influenza B by PCR NEGATIVE NEGATIVE Final    Comment: (NOTE) The Xpert Xpress SARS-CoV-2/FLU/RSV plus assay is intended as an aid in the diagnosis of influenza from Nasopharyngeal swab specimens and should not be used as a sole basis for treatment. Nasal washings and aspirates are unacceptable for Xpert Xpress SARS-CoV-2/FLU/RSV testing.  Fact Sheet for Patients: EntrepreneurPulse.com.au  Fact Sheet for Healthcare Providers: IncredibleEmployment.be  This test is not yet approved or cleared by the [place] FDA and has been authorized for detection and/or diagnosis of SARS-CoV-2 by FDA under an Emergency Use  Authorization (EUA). This EUA will remain in effect (meaning this test can be used) for the duration of the COVID-19 declaration under Section 564(b)(1) of the Act, 21 U.S.C. section 360bbb-3(b)(1), unless the authorization is terminated or revoked.     Resp Syncytial Virus by PCR NEGATIVE NEGATIVE Final    Comment: (NOTE) Fact Sheet for Patients: EntrepreneurPulse.com.au  Fact Sheet for Healthcare Providers: IncredibleEmployment.be  This test is not yet approved or cleared by the [place] FDA and has been authorized for detection and/or diagnosis of SARS-CoV-2 by FDA under an Emergency Use Authorization (EUA). This EUA will remain in effect (meaning this test can be used) for the duration of the COVID-19 declaration under Section 564(b)(1) of the Act, 21 U.S.C. section 360bbb-3(b)(1), unless the authorization is terminated or revoked.  Performed at [facility], [place] [address] [place], [place] [address]   Respiratory (~20 pathogens) panel by PCR     Status: Abnormal   Collection Time: 06/22/22  2:15 PM   Specimen: Nasopharyngeal Swab; Respiratory  Result Value Ref Range Status   Adenovirus NOT DETECTED NOT DETECTED Final   Coronavirus 229E NOT DETECTED NOT DETECTED Final    Comment: (NOTE) The Coronavirus on the Respiratory Panel, DOES NOT test for the novel  Coronavirus (2019 nCoV)    Coronavirus HKU1 NOT DETECTED NOT DETECTED Final   Coronavirus NL63 NOT DETECTED NOT DETECTED Final   Coronavirus OC43 NOT DETECTED NOT DETECTED Final   Metapneumovirus DETECTED (A) NOT DETECTED Final   Rhinovirus / Enterovirus NOT DETECTED NOT DETECTED Final   Influenza A NOT DETECTED NOT DETECTED Final   Influenza B NOT DETECTED NOT DETECTED Final   Parainfluenza Virus 1 NOT DETECTED NOT DETECTED Final   Parainfluenza Virus 2 NOT DETECTED NOT DETECTED Final   Parainfluenza Virus 3 NOT DETECTED NOT DETECTED Final   Parainfluenza Virus 4 NOT  DETECTED NOT DETECTED Final   Respiratory Syncytial Virus NOT DETECTED NOT DETECTED Final   Bordetella pertussis NOT DETECTED NOT DETECTED Final   Bordetella Parapertussis NOT DETECTED NOT DETECTED Final  Chlamydophila pneumoniae NOT DETECTED NOT DETECTED Final   Mycoplasma pneumoniae NOT DETECTED NOT DETECTED Final    Comment: Performed at [facility], [place] [address], [place], [place] [address]  MRSA Next Gen by PCR, Nasal     Status: None   Collection Time: 06/22/22  2:15 PM   Specimen: Nasal Mucosa; Nasal Swab  Result Value Ref Range Status   MRSA by PCR Next Gen NOT DETECTED NOT DETECTED Final    Comment: (NOTE) The GeneXpert MRSA Assay (FDA approved for NASAL specimens only), is one component of a comprehensive MRSA colonization surveillance program. It is not intended to diagnose MRSA infection nor to guide or monitor treatment for MRSA infections. Test performance is not FDA approved in patients less than 22 years old. Performed at [facility], [place] [address] [place], [place] [address]   Blood Culture (routine x 2)     Status: None (Preliminary result)   Collection Time: 06/22/22  4:18 PM   Specimen: BLOOD RIGHT ARM  Result Value Ref Range Status   Specimen Description BLOOD RIGHT ARM  Final   Special Requests   Final    BOTTLES DRAWN AEROBIC ONLY Blood Culture adequate volume   Culture   Final    NO GROWTH < 12 HOURS Performed at [facility], [place] [address], [place], [place] [address]    Report Status PENDING  Incomplete  Urine Culture     Status: None (Preliminary result)   Collection Time: 06/22/22  6:50 PM   Specimen: Urine, Catheterized  Result Value Ref Range Status   Specimen Description URINE, CATHETERIZED  Final   Special Requests   Final    NONE Reflexed from [id number] Performed at [facility], [place] [address], [place], [place] [address]    Culture PENDING  Incomplete   Report Status PENDING  Incomplete     Terri Piedra,  NP [facility]  06/23/2022  11:50 AM

## 2022-06-23 NOTE — Progress Notes (Signed)
PHARMACY - PHYSICIAN COMMUNICATION CRITICAL VALUE ALERT - BLOOD CULTURE IDENTIFICATION (BCID)  Charlene Houston is an 77 y.o. female who presented to [facility] on 06/22/2022 with a chief complaint of AMS/SOB/PNA  Assessment:   Blood culture growing MRSE, Enterococcus, and Proteus species  Name of physician (or Provider) Contacted:  Dr. Lucile Shutters  Current antibiotics:  Vancomycin and Cefepime   Changes to prescribed antibiotics recommended:  No changes at this time--F/u results from second set of blood cultures drawn  Results for orders placed or performed during the hospital encounter of 06/22/22  Blood Culture ID Panel (Reflexed) (Collected: 06/22/2022 10:05 AM)  Result Value Ref Range   Enterococcus faecalis DETECTED (A) NOT DETECTED   Enterococcus Faecium NOT DETECTED NOT DETECTED   Listeria monocytogenes NOT DETECTED NOT DETECTED   Staphylococcus species DETECTED (A) NOT DETECTED   Staphylococcus aureus (BCID) NOT DETECTED NOT DETECTED   Staphylococcus epidermidis DETECTED (A) NOT DETECTED   Staphylococcus lugdunensis NOT DETECTED NOT DETECTED   Streptococcus species NOT DETECTED NOT DETECTED   Streptococcus agalactiae NOT DETECTED NOT DETECTED   Streptococcus pneumoniae NOT DETECTED NOT DETECTED   Streptococcus pyogenes NOT DETECTED NOT DETECTED   A.calcoaceticus-baumannii NOT DETECTED NOT DETECTED   Bacteroides fragilis NOT DETECTED NOT DETECTED   Enterobacterales DETECTED (A) NOT DETECTED   Enterobacter cloacae complex NOT DETECTED NOT DETECTED   Escherichia coli NOT DETECTED NOT DETECTED   Klebsiella aerogenes NOT DETECTED NOT DETECTED   Klebsiella oxytoca NOT DETECTED NOT DETECTED   Klebsiella pneumoniae NOT DETECTED NOT DETECTED   Proteus species DETECTED (A) NOT DETECTED   Salmonella species NOT DETECTED NOT DETECTED   Serratia marcescens NOT DETECTED NOT DETECTED   Haemophilus influenzae NOT DETECTED NOT DETECTED   Neisseria meningitidis NOT DETECTED NOT  DETECTED   Pseudomonas aeruginosa NOT DETECTED NOT DETECTED   Stenotrophomonas maltophilia NOT DETECTED NOT DETECTED   Candida albicans NOT DETECTED NOT DETECTED   Candida auris NOT DETECTED NOT DETECTED   Candida glabrata NOT DETECTED NOT DETECTED   Candida krusei NOT DETECTED NOT DETECTED   Candida parapsilosis NOT DETECTED NOT DETECTED   Candida tropicalis NOT DETECTED NOT DETECTED   Cryptococcus neoformans/gattii NOT DETECTED NOT DETECTED   CTX-M ESBL NOT DETECTED NOT DETECTED   Carbapenem resistance IMP NOT DETECTED NOT DETECTED   Carbapenem resistance KPC NOT DETECTED NOT DETECTED   Methicillin resistance mecA/C DETECTED (A) NOT DETECTED   Carbapenem resistance NDM NOT DETECTED NOT DETECTED   Carbapenem resist OXA 48 LIKE NOT DETECTED NOT DETECTED   Vancomycin resistance NOT DETECTED NOT DETECTED   Carbapenem resistance VIM NOT DETECTED NOT DETECTED    Caryl Pina 06/23/2022  2:17 AM

## 2022-06-23 NOTE — Progress Notes (Signed)
Initial Nutrition Assessment  DOCUMENTATION CODES:   Non-severe (moderate) malnutrition in context of chronic illness  INTERVENTION:   Tube feeding via OG tube: - Start Vital 1.5 @ 20 ml/hr and advance rate by 10 ml q 8 hours to goal rate of 50 ml/hr (1200 ml/day) - PROSource TF20 60 ml daily  Tube feeding regimen at goal rate provides 1880 kcal, 101 grams of protein, and 917 ml of H2O.  - MVI with minerals daily per tube  - Once septic shock resolves and pt is off of pressor support, consider adding Juven BID to aid in wound healing  NUTRITION DIAGNOSIS:   Moderate Malnutrition related to chronic illness (lung cancer, HFrEF) as evidenced by moderate muscle depletion, percent weight loss (13% weight loss in < 6 months).  GOAL:   Patient will meet greater than or equal to 90% of their needs  MONITOR:   Vent status, Labs, Weight trends, TF tolerance, Skin, I & O's  REASON FOR ASSESSMENT:   Ventilator, Consult Enteral/tube feeding initiation and management  ASSESSMENT:   77 year old female who presented to the ED from SNF on 3/12 with AMS and respiratory distress. PMH of T2DM, HTN, breast cancer, non-small cell lung cancer of the L lung with metastatic spread to the R lung diagnosed in 2022 and initially treated with Keytruda, chronic HFrEF with LBBB. Pt admitted with septic shock, right-sided pneumonia, AKI with hyperkalemia.  Discussed pt with RN and during ICU rounds. Consult received for enteral nutrition initiation and management. Pt with OG tube in stomach per CT chest/abdomen/pelvis reading. CT also shows non-distended small bowel and partially distended colon.  Spoke with pt's daughter at bedside. She reports that pt has been residing at [facility] with frequent hospital admissions since January. Pt's daughter shares that pt needed to be fd at all meals and was consuming pureed foods. Pt would occasionally refuse to eat altogether. The most that pt would eat at a meal  would be about 50%. Pt was receiving protein modulars per daughter.  Pt's daughter reports that pt has been losing weight for quite a while. She is unsure of pt's UBW or exactly how much weight pt has lost as pt was refusing to be weighed at SNF. Reviewed weight history in chart. Pt with a 12.8 kg weight loss since 01/13/22. This is a 13% weight loss in under 6 months which is significant for timeframe. Pt meets criteria for malnutrition based on weight loss and NFPE.  Pt with muscle depletions to BLE as well as unstageable wound to sacrum. Pt's daughter reports that pt has basically been bedbound or in a wheelchair since January. Lately, pt has not been getting out of bed at all.  Admit weight: 83.5 kg Current weight: 87.2 kg  Patient is currently intubated on ventilator support MV: 11 L/min Temp (24hrs), Avg:99.8 F (37.7 C), Min:96.9 F (36.1 C), Max:100.4 F (38 C) BP (cuff): 118/66 MAP (cuff): 78  Drips: Fentanyl Levophed: 38 mcg/min Vasopressin: 0.04 units/min  Medications reviewed and include: pepcid, SSI q 4 hours, lokelma 10 grams daily, IV abx  Labs reviewed: sodium 132, BUN 55, creatinine 1.47, ionized calcium 1.10, elevated LFTs, lactic acid 3.6, hemoglobin 8.2, platelets 32 CBG's: 119-217 x 24 hours  UOP: 255 ml x 24 hours I/O's: +3.5 L since admit  NUTRITION - FOCUSED PHYSICAL EXAM:  Flowsheet Row Most Recent Value  Orbital Region Mild depletion  Upper Arm Region No depletion  Thoracic and Lumbar Region No depletion  Buccal Region Unable to  assess  Temple Region No depletion  Clavicle Bone Region Mild depletion  Clavicle and Acromion Bone Region Moderate depletion  Scapular Bone Region Mild depletion  Dorsal Hand No depletion  Patellar Region Moderate depletion  Anterior Thigh Region Moderate depletion  Posterior Calf Region Moderate depletion  Edema (RD Assessment) Moderate  [BLE]  Hair Reviewed  Eyes Reviewed  Mouth Reviewed  Skin Reviewed  Nails  Reviewed       Diet Order:   Diet Order             Diet NPO time specified  Diet effective now                   EDUCATION NEEDS:   No education needs have been identified at this time  Skin:  Skin Assessment: Skin Integrity Issues: Unstageable: sacrum Other: full and partial thickness wounds to bilateral buttocks and ischial tuberosity (per WOC note)  Last BM:  no documented BM  Height:   Ht Readings from Last 1 Encounters:  06/22/22 '5\' 5"'$  (1.651 m)    Weight:   Wt Readings from Last 1 Encounters:  06/23/22 87.2 kg    Ideal Body Weight:  56.8 kg  BMI:  Body mass index is 31.99 kg/m.  Estimated Nutritional Needs:   Kcal:  1700-1900  Protein:  85-100 grams  Fluid:  1.7-1.9 L    Gustavus Bryant, MS, RD, LDN Inpatient Clinical Dietitian Please see AMiON for contact information.

## 2022-06-23 NOTE — Inpatient Diabetes Management (Signed)
Inpatient Diabetes Program Recommendations  AACE/ADA: New Consensus Statement on Inpatient Glycemic Control  Target Ranges:  Prepandial:   less than 140 mg/dL      Peak postprandial:   less than 180 mg/dL (1-2 hours)      Critically ill patients:  140 - 180 mg/dL    Latest Reference Range & Units 06/23/22 00:14 06/23/22 03:08 06/23/22 06:10 06/23/22 08:02  Glucose-Capillary 70 - 99 mg/dL 217 (H)  Novolog 3 units 207 (H)  Novolog 3 units  Novolog 5 units & D50 50 ml '@4'$ :20 206 (H) 206 (H)  Novolog 3 units    Latest Reference Range & Units 06/22/22 09:49 06/22/22 15:25 06/22/22 19:09 06/22/22 21:01 06/22/22 23:01  Glucose-Capillary 70 - 99 mg/dL 68 (L) 119 (H) 170 (H) 183 (H)  Novolog 2 units 189 (H)  Novolog 5 units  D50 50 ml   Review of Glycemic Control  Diabetes history: DM2 Outpatient Diabetes medications: Jardiance 10 mg daily, Amaryl 2 mg daily Current orders for Inpatient glycemic control: Novolog 0-15 units Q4H; Vital @ 40 ml/hr  Inpatient Diabetes Program Recommendations:    Insulin: Please consider ordering Semglee 5 units Q24H.  Once tube feedings are started and if glucose is consistently over 180 mg/dl, please consider ordering Novolog 3 units Q4H for tube feeding coverage. If tube feeding is stopped or held then Novolog tube feeding coverage should also be stopped or held.  Thanks, Barnie Alderman, RN, MSN, [facility] Coordinator Inpatient Diabetes Program [phone] (Team Pager from 8am to [place])

## 2022-06-24 DIAGNOSIS — J9601 Acute respiratory failure with hypoxia: Secondary | ICD-10-CM | POA: Diagnosis not present

## 2022-06-24 DIAGNOSIS — R6521 Severe sepsis with septic shock: Secondary | ICD-10-CM | POA: Diagnosis not present

## 2022-06-24 DIAGNOSIS — A419 Sepsis, unspecified organism: Secondary | ICD-10-CM | POA: Diagnosis not present

## 2022-06-24 DIAGNOSIS — J9600 Acute respiratory failure, unspecified whether with hypoxia or hypercapnia: Secondary | ICD-10-CM | POA: Diagnosis not present

## 2022-06-24 DIAGNOSIS — J123 Human metapneumovirus pneumonia: Secondary | ICD-10-CM | POA: Diagnosis not present

## 2022-06-24 LAB — CBC WITH DIFFERENTIAL/PLATELET
Abs Immature Granulocytes: 0.13 10*3/uL — ABNORMAL HIGH (ref 0.00–0.07)
Basophils Absolute: 0.1 10*3/uL (ref 0.0–0.1)
Basophils Relative: 1 %
Eosinophils Absolute: 0 10*3/uL (ref 0.0–0.5)
Eosinophils Relative: 0 %
HCT: 29.1 % — ABNORMAL LOW (ref 36.0–46.0)
Hemoglobin: 9.1 g/dL — ABNORMAL LOW (ref 12.0–15.0)
Immature Granulocytes: 2 %
Lymphocytes Relative: 8 %
Lymphs Abs: 0.6 10*3/uL — ABNORMAL LOW (ref 0.7–4.0)
MCH: 30.4 pg (ref 26.0–34.0)
MCHC: 31.3 g/dL (ref 30.0–36.0)
MCV: 97.3 fL (ref 80.0–100.0)
Monocytes Absolute: 0.2 10*3/uL (ref 0.1–1.0)
Monocytes Relative: 3 %
Neutro Abs: 6.6 10*3/uL (ref 1.7–7.7)
Neutrophils Relative %: 86 %
Platelets: 16 10*3/uL — CL (ref 150–400)
RBC: 2.99 MIL/uL — ABNORMAL LOW (ref 3.87–5.11)
RDW: 16.5 % — ABNORMAL HIGH (ref 11.5–15.5)
Smear Review: DECREASED
WBC Morphology: INCREASED
WBC: 7.6 10*3/uL (ref 4.0–10.5)
nRBC: 3.6 % — ABNORMAL HIGH (ref 0.0–0.2)

## 2022-06-24 LAB — URINE CULTURE

## 2022-06-24 LAB — BASIC METABOLIC PANEL WITH GFR
Anion gap: 11 (ref 5–15)
Anion gap: 19 — ABNORMAL HIGH (ref 5–15)
BUN: 58 mg/dL — ABNORMAL HIGH (ref 8–23)
BUN: 61 mg/dL — ABNORMAL HIGH (ref 8–23)
CO2: 17 mmol/L — ABNORMAL LOW (ref 22–32)
CO2: 23 mmol/L (ref 22–32)
Calcium: 7.9 mg/dL — ABNORMAL LOW (ref 8.9–10.3)
Calcium: 8.2 mg/dL — ABNORMAL LOW (ref 8.9–10.3)
Chloride: 100 mmol/L (ref 98–111)
Chloride: 103 mmol/L (ref 98–111)
Creatinine, Ser: 1.3 mg/dL — ABNORMAL HIGH (ref 0.44–1.00)
Creatinine, Ser: 1.35 mg/dL — ABNORMAL HIGH (ref 0.44–1.00)
GFR, Estimated: 41 mL/min — ABNORMAL LOW
GFR, Estimated: 43 mL/min — ABNORMAL LOW
Glucose, Bld: 277 mg/dL — ABNORMAL HIGH (ref 70–99)
Glucose, Bld: 331 mg/dL — ABNORMAL HIGH (ref 70–99)
Potassium: 3.8 mmol/L (ref 3.5–5.1)
Potassium: 3.9 mmol/L (ref 3.5–5.1)
Sodium: 134 mmol/L — ABNORMAL LOW (ref 135–145)
Sodium: 139 mmol/L (ref 135–145)

## 2022-06-24 LAB — VANCOMYCIN, RANDOM: Vancomycin Rm: 13 ug/mL

## 2022-06-24 LAB — LEGIONELLA PNEUMOPHILA SEROGP 1 UR AG: L. pneumophila Serogp 1 Ur Ag: NEGATIVE

## 2022-06-24 LAB — POTASSIUM
Potassium: 3.8 mmol/L (ref 3.5–5.1)
Potassium: 3.5 mmol/L (ref 3.5–5.1)
Potassium: 3.4 mmol/L — ABNORMAL LOW (ref 3.5–5.1)
Potassium: 3.6 mmol/L (ref 3.5–5.1)

## 2022-06-24 LAB — PHOSPHORUS
Phosphorus: 2.5 mg/dL (ref 2.5–4.6)
Phosphorus: 2.2 mg/dL — ABNORMAL LOW (ref 2.5–4.6)

## 2022-06-24 LAB — GLUCOSE, CAPILLARY
Glucose-Capillary: 264 mg/dL — ABNORMAL HIGH (ref 70–99)
Glucose-Capillary: 291 mg/dL — ABNORMAL HIGH (ref 70–99)
Glucose-Capillary: 322 mg/dL — ABNORMAL HIGH (ref 70–99)
Glucose-Capillary: 310 mg/dL — ABNORMAL HIGH (ref 70–99)
Glucose-Capillary: 329 mg/dL — ABNORMAL HIGH (ref 70–99)
Glucose-Capillary: 328 mg/dL — ABNORMAL HIGH (ref 70–99)

## 2022-06-24 LAB — MAGNESIUM
Magnesium: 2 mg/dL (ref 1.7–2.4)
Magnesium: 2.2 mg/dL (ref 1.7–2.4)

## 2022-06-24 MED ORDER — INSULIN ASPART 100 UNIT/ML IJ SOLN
2.0000 [IU] | INTRAMUSCULAR | Status: DC
  Administered 2022-06-24 – 2022-06-25 (×5): 2 [IU] via SUBCUTANEOUS

## 2022-06-24 MED ORDER — VANCOMYCIN HCL 750 MG/150ML IV SOLN
750.0000 mg | INTRAVENOUS | Status: DC
  Administered 2022-06-24 – 2022-06-25 (×2): 750 mg via INTRAVENOUS
  Filled 2022-06-24 (×3): qty 150

## 2022-06-24 MED ORDER — FUROSEMIDE 10 MG/ML IJ SOLN
40.0000 mg | Freq: Once | INTRAMUSCULAR | Status: AC
  Administered 2022-06-24: 40 mg via INTRAVENOUS
  Filled 2022-06-24: qty 4

## 2022-06-24 MED ORDER — INSULIN GLARGINE-YFGN 100 UNIT/ML ~~LOC~~ SOLN
10.0000 [IU] | Freq: Two times a day (BID) | SUBCUTANEOUS | Status: DC
  Administered 2022-06-24 (×2): 10 [IU] via SUBCUTANEOUS
  Filled 2022-06-24 (×4): qty 0.1

## 2022-06-24 MED ORDER — POTASSIUM CHLORIDE 20 MEQ PO PACK
20.0000 meq | PACK | Freq: Once | ORAL | Status: AC
  Administered 2022-06-24: 20 meq
  Filled 2022-06-24: qty 1

## 2022-06-24 MED ORDER — POTASSIUM CHLORIDE 20 MEQ PO PACK
40.0000 meq | PACK | Freq: Once | ORAL | Status: AC
  Administered 2022-06-24: 40 meq
  Filled 2022-06-24: qty 2

## 2022-06-24 MED ORDER — INSULIN ASPART 100 UNIT/ML IJ SOLN
0.0000 [IU] | INTRAMUSCULAR | Status: DC
  Administered 2022-06-24 (×3): 15 [IU] via SUBCUTANEOUS
  Administered 2022-06-25: 11 [IU] via SUBCUTANEOUS
  Administered 2022-06-25 (×3): 15 [IU] via SUBCUTANEOUS
  Administered 2022-06-25: 7 [IU] via SUBCUTANEOUS
  Administered 2022-06-25: 20 [IU] via SUBCUTANEOUS
  Administered 2022-06-25: 7 [IU] via SUBCUTANEOUS
  Administered 2022-06-26: 15 [IU] via SUBCUTANEOUS
  Administered 2022-06-26: 11 [IU] via SUBCUTANEOUS

## 2022-06-24 NOTE — Progress Notes (Signed)
Pharmacy Antibiotic Note  Charlene Houston is a 77 y.o. female admitted on 06/22/2022 with sepsis.  His blood cultures are positive for E. Faecalis, staph epidermidis and proteus spp, susceptibilities are pending. Scr improved to 1.30 from a max of 1.51 (baseline 0.90). Pharmacy has been consulted for vancomycin and cefepime dosing. Vtr 13 this morning indicating acceptable clearance since last dose.   Plan: Adjust vancomycin to '750mg'$  Q24h (eAUC 535.0) Continue cefepime 2g IV q12h.  Monitor for changes in renal function, CBC, vitals.  Height: '5\' 5"'$  (165.1 cm) Weight: 87.9 kg (193 lb 12.6 oz) IBW/kg (Calculated) : 57  Temp (24hrs), Avg:99.3 F (37.4 C), Min:98.4 F (36.9 C), Max:100.4 F (38 C)  Recent Labs  Lab 06/22/22 1005 06/22/22 1044 06/22/22 1536 06/22/22 2104 06/22/22 2325 06/23/22 0206 06/23/22 2340 06/24/22 0723  WBC 6.8  --   --   --   --  7.6  --   --   CREATININE 1.33* 1.30*  --  1.51* 1.44* 1.47* 1.35*  --   LATICACIDVEN 5.9*  --  3.6*  --   --   --   --   --   VANCORANDOM  --   --   --   --   --   --   --  13     Estimated Creatinine Clearance: 38.8 mL/min (A) (by C-G formula based on SCr of 1.35 mg/dL (H)).    Allergies  Allergen Reactions   Prozac [Fluoxetine Hcl] Other (See Comments)    Jitters   Safflower Oil Other (See Comments)    Unknown reaction Documented on MAR    Antimicrobials this admission: Vanc 3/12 >>  Cefepime 3/12 >>   Dose adjustments this admission: 3/14 Vancomycin 750 Q24h > 1250 Q48h   Microbiology results: 3/12 Ucx: multiple species 3/12 Bcx: GNR, GPC in aerobic and anaerobic 3/12 Bcx: BCID - MRSE, E. Faecalis, Proteus 3/12 Metapneumovirus +   Thank you for allowing pharmacy to be a part of this patient's care. Titus Dubin, PharmD PGY1 Pharmacy Resident 06/24/2022 10:53 AM

## 2022-06-24 NOTE — Progress Notes (Signed)
[facility] for Infectious Disease  Date of Admission:  06/22/2022     Total days of antibiotics 3         ASSESSMENT:  Ms. Mocarski remains intubated. Source of the infection remains unclear. Secondary blood culture on 3/12 remains without growth to date. Will obtain additional blood culture to ensure clearance of bacteremia. Would recommend removal or port with concern for possible infection as there are no current plans for additional chemotherapy. Previously have spoke with Palliative Care and would recommend follow up to discuss goals of care. Additional work up for wound would be dependent upon goals of care although unlikely to heal for as long as she is bed bound with decreased nutrition. May need MRI when stable. Continue current dose of vancomycin and cefepime. No current indication for atypical coverage and will discontinue doxycycline. Plan of care discussed with daughter at beside. Remaining medical and supportive care per PCCM.   PLAN:  Continue current dose of vancomycin and cefepime Recheck blood cultures. Recommend port removal with concern for infection. Additional work up of sacral wound pending goals of care.  Recommend Palliative Care follow up for goals of care.  Remaining medical and supportive care per PCCM.   Principal Problem:   Respiratory failure (HCC) Active Problems:   Bacteremia   Pneumonia of right lower lobe due to infectious organism   Septic shock ([place])   Bronchopneumonia due to human metapneumovirus (hMPV)    Chlorhexidine Gluconate Cloth  6 each Topical Daily   famotidine  20 mg Per Tube Daily   feeding supplement (PROSource TF20)  60 mL Per Tube Daily   heparin  5,000 Units Subcutaneous Q8H   insulin aspart  0-20 Units Subcutaneous Q4H   insulin aspart  2 Units Subcutaneous Q4H   insulin glargine-yfgn  10 Units Subcutaneous BID   multivitamin with minerals  1 tablet Per Tube Daily   mouth rinse  15 mL Mouth Rinse Q2H   sodium  hypochlorite   Topical BID    SUBJECTIVE:  Afebrile overnight with no acute events. Remains on ventilator. Daughter at bedside.   Allergies  Allergen Reactions   Prozac [Fluoxetine Hcl] Other (See Comments)    Jitters   Safflower Oil Other (See Comments)    Unknown reaction Documented on MAR     Review of Systems: Review of Systems  Unable to perform ROS: Intubated      OBJECTIVE: Vitals:   06/24/22 1015 06/24/22 1030 06/24/22 1045 06/24/22 1154  BP: (!) 95/59 98/76 117/72   Pulse: 84 87 89 95  Resp: (!) 25 (!) 25 (!) 23 16  Temp: 98.6 F (37 C) 98.6 F (37 C) 98.4 F (36.9 C)   TempSrc:      SpO2: 100% 100% (!) 83%   Weight:      Height:       Body mass index is 32.25 kg/m.  Physical Exam Constitutional:      General: She is not in acute distress.    Appearance: She is well-developed. She is ill-appearing.     Interventions: She is sedated and intubated.  Cardiovascular:     Rate and Rhythm: Normal rate and regular rhythm.     Heart sounds: Normal heart sounds.  Pulmonary:     Effort: Pulmonary effort is normal. She is intubated.     Breath sounds: Normal breath sounds.  Skin:    General: Skin is warm and dry.     Lab Results Lab Results  Component Value Date   WBC PENDING 06/24/2022   HGB 9.1 (L) 06/24/2022   HCT 29.1 (L) 06/24/2022   MCV 97.3 06/24/2022   PLT PENDING 06/24/2022    Lab Results  Component Value Date   CREATININE 1.30 (H) 06/24/2022   BUN 61 (H) 06/24/2022   NA 139 06/24/2022   K 3.5 06/24/2022   CL 103 06/24/2022   CO2 17 (L) 06/24/2022    Lab Results  Component Value Date   ALT 163 (H) 06/23/2022   AST 233 (H) 06/23/2022   ALKPHOS 103 06/23/2022   BILITOT 2.0 (H) 06/23/2022     Microbiology: Recent Results (from the past 240 hour(s))  Blood Culture (routine x 2)     Status: None (Preliminary result)   Collection Time: 06/22/22 10:05 AM   Specimen: BLOOD  Result Value Ref Range Status   Specimen Description  BLOOD LEFT ANTECUBITAL  Final   Special Requests   Final    BOTTLES DRAWN AEROBIC AND ANAEROBIC Blood Culture results may not be optimal due to an inadequate volume of blood received in culture bottles   Culture  Setup Time   Final    GRAM NEGATIVE RODS GRAM POSITIVE COCCI IN BOTH AEROBIC AND ANAEROBIC BOTTLES CRITICAL RESULT CALLED TO, READ BACK BY AND VERIFIED WITH: PHARMD G. ABBOTT 06/23/22 @ 0210 BY AB    Culture   Final    GRAM NEGATIVE RODS GRAM POSITIVE COCCI IDENTIFICATION AND SUSCEPTIBILITIES TO FOLLOW Performed at [facility], [place] [address], [place], [place] [address]    Report Status PENDING  Incomplete  Blood Culture ID Panel (Reflexed)     Status: Abnormal   Collection Time: 06/22/22 10:05 AM  Result Value Ref Range Status   Enterococcus faecalis DETECTED (A) NOT DETECTED Final    Comment: CRITICAL RESULT CALLED TO, READ BACK BY AND VERIFIED WITH: PHARMD G. ABBOTT 06/23/22 @ 0210 BY AB    Enterococcus Faecium NOT DETECTED NOT DETECTED Final   Listeria monocytogenes NOT DETECTED NOT DETECTED Final   Staphylococcus species DETECTED (A) NOT DETECTED Final    Comment: CRITICAL RESULT CALLED TO, READ BACK BY AND VERIFIED WITH: PHARMD G. ABBOTT 06/23/22 @ 0210 BY AB    Staphylococcus aureus (BCID) NOT DETECTED NOT DETECTED Final   Staphylococcus epidermidis DETECTED (A) NOT DETECTED Final    Comment: Methicillin (oxacillin) resistant coagulase negative staphylococcus. Possible blood culture contaminant (unless isolated from more than one blood culture draw or clinical case suggests pathogenicity). No antibiotic treatment is indicated for blood  culture contaminants. CRITICAL RESULT CALLED TO, READ BACK BY AND VERIFIED WITH: PHARMD G. ABBOTT 06/23/22 @ 0210 BY AB    Staphylococcus lugdunensis NOT DETECTED NOT DETECTED Final   Streptococcus species NOT DETECTED NOT DETECTED Final   Streptococcus agalactiae NOT DETECTED NOT DETECTED Final   Streptococcus pneumoniae NOT  DETECTED NOT DETECTED Final   Streptococcus pyogenes NOT DETECTED NOT DETECTED Final   A.calcoaceticus-baumannii NOT DETECTED NOT DETECTED Final   Bacteroides fragilis NOT DETECTED NOT DETECTED Final   Enterobacterales DETECTED (A) NOT DETECTED Final    Comment: Enterobacterales represent a large order of gram negative bacteria, not a single organism. CRITICAL RESULT CALLED TO, READ BACK BY AND VERIFIED WITH: PHARMD G. ABBOTT 06/23/22 @ 0210 BY AB    Enterobacter cloacae complex NOT DETECTED NOT DETECTED Final   Escherichia coli NOT DETECTED NOT DETECTED Final   Klebsiella aerogenes NOT DETECTED NOT DETECTED Final   Klebsiella oxytoca NOT DETECTED NOT DETECTED  Final   Klebsiella pneumoniae NOT DETECTED NOT DETECTED Final   Proteus species DETECTED (A) NOT DETECTED Final    Comment: CRITICAL RESULT CALLED TO, READ BACK BY AND VERIFIED WITH: PHARMD G. ABBOTT 06/23/22 @ 0210 BY AB    Salmonella species NOT DETECTED NOT DETECTED Final   Serratia marcescens NOT DETECTED NOT DETECTED Final   Haemophilus influenzae NOT DETECTED NOT DETECTED Final   Neisseria meningitidis NOT DETECTED NOT DETECTED Final   Pseudomonas aeruginosa NOT DETECTED NOT DETECTED Final   Stenotrophomonas maltophilia NOT DETECTED NOT DETECTED Final   Candida albicans NOT DETECTED NOT DETECTED Final   Candida auris NOT DETECTED NOT DETECTED Final   Candida glabrata NOT DETECTED NOT DETECTED Final   Candida krusei NOT DETECTED NOT DETECTED Final   Candida parapsilosis NOT DETECTED NOT DETECTED Final   Candida tropicalis NOT DETECTED NOT DETECTED Final   Cryptococcus neoformans/gattii NOT DETECTED NOT DETECTED Final   CTX-M ESBL NOT DETECTED NOT DETECTED Final   Carbapenem resistance IMP NOT DETECTED NOT DETECTED Final   Carbapenem resistance KPC NOT DETECTED NOT DETECTED Final   Methicillin resistance mecA/C DETECTED (A) NOT DETECTED Final    Comment: CRITICAL RESULT CALLED TO, READ BACK BY AND VERIFIED WITH: PHARMD G.  ABBOTT 06/23/22 @ 0210 BY AB    Carbapenem resistance NDM NOT DETECTED NOT DETECTED Final   Carbapenem resist OXA 48 LIKE NOT DETECTED NOT DETECTED Final   Vancomycin resistance NOT DETECTED NOT DETECTED Final   Carbapenem resistance VIM NOT DETECTED NOT DETECTED Final    Comment: Performed at [facility], [address], [place], [place] [address]  Resp panel by RT-PCR (RSV, Flu A&B, Covid) Anterior Nasal Swab     Status: None   Collection Time: 06/22/22 10:07 AM   Specimen: Anterior Nasal Swab  Result Value Ref Range Status   SARS Coronavirus 2 by RT PCR NEGATIVE NEGATIVE Final   Influenza A by PCR NEGATIVE NEGATIVE Final   Influenza B by PCR NEGATIVE NEGATIVE Final    Comment: (NOTE) The Xpert Xpress SARS-CoV-2/FLU/RSV plus assay is intended as an aid in the diagnosis of influenza from Nasopharyngeal swab specimens and should not be used as a sole basis for treatment. Nasal washings and aspirates are unacceptable for Xpert Xpress SARS-CoV-2/FLU/RSV testing.  Fact Sheet for Patients: EntrepreneurPulse.com.au  Fact Sheet for Healthcare Providers: IncredibleEmployment.be  This test is not yet approved or cleared by the [place] FDA and has been authorized for detection and/or diagnosis of SARS-CoV-2 by FDA under an Emergency Use Authorization (EUA). This EUA will remain in effect (meaning this test can be used) for the duration of the COVID-19 declaration under Section 564(b)(1) of the Act, 21 U.S.C. section 360bbb-3(b)(1), unless the authorization is terminated or revoked.     Resp Syncytial Virus by PCR NEGATIVE NEGATIVE Final    Comment: (NOTE) Fact Sheet for Patients: EntrepreneurPulse.com.au  Fact Sheet for Healthcare Providers: IncredibleEmployment.be  This test is not yet approved or cleared by the [place] FDA and has been authorized for detection and/or diagnosis of SARS-CoV-2  by FDA under an Emergency Use Authorization (EUA). This EUA will remain in effect (meaning this test can be used) for the duration of the COVID-19 declaration under Section 564(b)(1) of the Act, 21 U.S.C. section 360bbb-3(b)(1), unless the authorization is terminated or revoked.  Performed at [facility], [place] [address], [place], [place] [address]   Respiratory (~20 pathogens) panel by PCR     Status: Abnormal  Collection Time: 06/22/22  2:15 PM   Specimen: Nasopharyngeal Swab; Respiratory  Result Value Ref Range Status   Adenovirus NOT DETECTED NOT DETECTED Final   Coronavirus 229E NOT DETECTED NOT DETECTED Final    Comment: (NOTE) The Coronavirus on the Respiratory Panel, DOES NOT test for the novel  Coronavirus (2019 nCoV)    Coronavirus HKU1 NOT DETECTED NOT DETECTED Final   Coronavirus NL63 NOT DETECTED NOT DETECTED Final   Coronavirus OC43 NOT DETECTED NOT DETECTED Final   Metapneumovirus DETECTED (A) NOT DETECTED Final   Rhinovirus / Enterovirus NOT DETECTED NOT DETECTED Final   Influenza A NOT DETECTED NOT DETECTED Final   Influenza B NOT DETECTED NOT DETECTED Final   Parainfluenza Virus 1 NOT DETECTED NOT DETECTED Final   Parainfluenza Virus 2 NOT DETECTED NOT DETECTED Final   Parainfluenza Virus 3 NOT DETECTED NOT DETECTED Final   Parainfluenza Virus 4 NOT DETECTED NOT DETECTED Final   Respiratory Syncytial Virus NOT DETECTED NOT DETECTED Final   Bordetella pertussis NOT DETECTED NOT DETECTED Final   Bordetella Parapertussis NOT DETECTED NOT DETECTED Final   Chlamydophila pneumoniae NOT DETECTED NOT DETECTED Final   Mycoplasma pneumoniae NOT DETECTED NOT DETECTED Final    Comment: Performed at [facility], [place]. [address], [place], [place] [address]  MRSA Next Gen by PCR, Nasal     Status: None   Collection Time: 06/22/22  2:15 PM   Specimen: Nasal Mucosa; Nasal Swab  Result Value Ref Range Status   MRSA by PCR Next Gen NOT DETECTED NOT DETECTED Final     Comment: (NOTE) The GeneXpert MRSA Assay (FDA approved for NASAL specimens only), is one component of a comprehensive MRSA colonization surveillance program. It is not intended to diagnose MRSA infection nor to guide or monitor treatment for MRSA infections. Test performance is not FDA approved in patients less than 46 years old. Performed at [facility], [place] [address] [place], [place] [address]   Remove and replace urinary cath (placed > 5 days) then obtain urine culture from new indwelling urinary catheter.     Status: Abnormal   Collection Time: 06/22/22  2:20 PM   Specimen: Urine, Catheterized  Result Value Ref Range Status   Specimen Description URINE, CATHETERIZED  Final   Special Requests   Final    NONE Performed at [facility], [address], [place], [place] [address]    Culture MULTIPLE SPECIES PRESENT, SUGGEST RECOLLECTION (A)  Final   Report Status 06/24/2022 FINAL  Final  Blood Culture (routine x 2)     Status: None (Preliminary result)   Collection Time: 06/22/22  4:18 PM   Specimen: BLOOD RIGHT ARM  Result Value Ref Range Status   Specimen Description BLOOD RIGHT ARM  Final   Special Requests   Final    BOTTLES DRAWN AEROBIC ONLY Blood Culture adequate volume   Culture   Final    NO GROWTH 2 DAYS Performed at [facility], [place] [address], [place], [place] [address]    Report Status PENDING  Incomplete  Urine Culture     Status: Abnormal   Collection Time: 06/22/22  6:50 PM   Specimen: Urine, Catheterized  Result Value Ref Range Status   Specimen Description URINE, CATHETERIZED  Final   Special Requests   Final    NONE Reflexed from [id number] Performed at [facility], [address] [place],  [address]    Culture MULTIPLE SPECIES PRESENT,  SUGGEST RECOLLECTION (A)  Final   Report Status 06/23/2022 FINAL  Final     Terri Piedra, NP [place] [facility] Infectious Disease [facility]  06/24/2022  1:01  PM

## 2022-06-24 NOTE — Progress Notes (Signed)
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  Has been hypokalemic receiving aggressive replacements.  eICU Interventions  Multiple checks have been within reason, responding appropriately to therapy, will space out potassium checks to twice daily     Intervention Category Minor Interventions: Clinical assessment - ordering diagnostic tests  Shyane Fossum 06/24/2022, 11:36 PM

## 2022-06-24 NOTE — Progress Notes (Signed)
NAME:  Charlene Houston, MRN:  [id number], DOB:  06-03-1945, LOS: 2 ADMISSION DATE:  06/22/2022, CONSULTATION DATE:  06/24/22 REFERRING MD:  EDP  CHIEF COMPLAINT:  shortness of breath   History of Present Illness:  Charlene Houston is a 77 y.o. 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  She developed pneumonitis and treatment was discontinued.  She subsequently had a decline in baseline functional status and has been residing in nursing facility with recent admission for  Klebsiella UTI.  She presented to the ED on 3/12 with respiratory distress and altered mental status.  She was intubated on arrival to the ED, work-up significant for RLL pneumonia on CT and lactic acid of 5.9, K 6.6, creatinine 1.3, ALT 99, AST 61, WBC 6.8, troponin 205, pH 7.5, pO2 505.  She was treated with IVF and broad spectrum antibiotics and required Levophed for blood pressure support. PCCM consulted for admission.   She has a known sacral decubitus ulcer, no gas tracking or abscess on CT.  Pertinent  Medical History   has a past medical history of Arthritis, Asthma, Breast cancer ([place]), Diabetes mellitus without complication ([place]), Hypertension, and SBO (small bowel obstruction) ([place]) (06/2018).   Significant Hospital Events: Including procedures, antibiotic start and stop dates in addition to other pertinent events   3/12 admit to PCCM with septic shock on levophed   Interim History / Subjective:   No acute events overnight Remains sedated, intubated and on pressors Patient's daughter updated at the bedside  Objective   Blood pressure 118/70, pulse 87, temperature 99 F (37.2 C), resp. rate (!) 25, height '5\' 5"'$  (1.651 m), weight 87.9 kg, SpO2 100 %.    Vent Mode: PRVC FiO2 (%):  [40 %] 40 % Set Rate:  [25 bmp] 25 bmp Vt Set:  [450 mL] 450 mL PEEP:  [5  cmH20] 5 cmH20 Plateau Pressure:  [23 cmH20-26 cmH20] 24 cmH20   Intake/Output Summary (Last 24 hours) at 06/24/2022 0800 Last data filed at 06/24/2022 0533 Gross per 24 hour  Intake 2549.22 ml  Output 1000 ml  Net 1549.22 ml   Filed Weights   06/22/22 1530 06/23/22 1003 06/24/22 0500  Weight: 83.5 kg 87.2 kg 87.9 kg    General:  chronically ill-appearing woman, intubated and sedated  HEENT: MM pink/moist, sclera anicteric Neuro: PERRL, triggering vent, spontaneously moving extremities CV: s1s2 rrr, no m/r/g PULM: course breath sounds, no wheezing GI/GU: soft, non-tender, indwelling foley catheter Extremities: warm/dry, 2+ pitting edema  Skin: no rashes or lesions, sacral decub as previously documented below      Resolved Hospital Problem list   hyperkalemia   Assessment & Plan:   Acute Hypoxic Respiratory Failure Secondary to R-sided pneumonia  Metapneumo virus + Possible UTI -Maintain full vent support with SAT/SBT as tolerated -titrate Vent setting to maintain SpO2 greater than or equal to 90%. -HOB elevated 30 degrees. -Plateau pressures less than 30 cm H20.  -Follow chest x-ray, ABG prn.   -Bronchial hygiene and RT/bronchodilator protocol. - Continue vancomycin and cefepime  Septic Shock -continue vancomycin and cefepime. Continue azithromycin for atypical coverage. -follow blood and urine cultures - Urine strep ag is negative -continue levophed and vasopressin to maintain MAP >65   Chronic HFrEF with LBBB Elevated troponin -elevated to ~200, EKG with LBBB though not new -trend troponin,  consider repeat echo if rising, last was two months ago - will start to diurese today  Type 2 DM -SSI  AKI  Acute Metabolic Acidosis/lactic Acidosis Baseline creatinine .8-.9, 1.3 on admission -likely secondary to shock -follow BMP and UOP, avoid nephrotoxins  Elevated transaminases  Likely secondary to shock liver -cholelithiasis noted on CT, if worsening  consider RUQ [place]  Decubitus Ulcer, POA - wound care management  Best Practice (right click and "Reselect all SmartList Selections" daily)   Diet/type: tubefeeds DVT prophylaxis: prophylactic heparin  GI prophylaxis: PPI Lines: N/A Foley:  Yes, and it is still needed Code Status:  DNR Last date of multidisciplinary goals of care discussion [Updated daughter at bedside today. Palliative care consult placed.]  Labs   CBC: Recent Labs  Lab 06/22/22 1005 06/22/22 1044 06/22/22 1106 06/23/22 0206 06/23/22 0504  WBC 6.8  --   --  7.6  --   NEUTROABS 5.4  --   --   --   --   HGB 11.5* 10.5* 9.2* 11.4* 8.2*  HCT 36.7 31.0* 27.0* 36.5 24.0*  MCV 101.9*  --   --  100.0  --   PLT PLATELET CLUMPS NOTED ON SMEAR, COUNT APPEARS DECREASED  --   --  32*  --     Basic Metabolic Panel: Recent Labs  Lab 06/22/22 1005 06/22/22 1044 06/22/22 1106 06/22/22 2104 06/22/22 2325 06/23/22 0206 06/23/22 0504 06/23/22 0622 06/23/22 1025 06/23/22 1438 06/23/22 1905 06/23/22 2236 06/23/22 2340  NA 134* 134*   < > 132* 134* 133* 132*  --   --   --   --   --  134*  K 6.6* 5.5*   < > 6.8* 6.7* 5.7* 4.5   < > 5.8* 5.4* 6.2* 4.0 3.8  CL 92* 95*  --  95* 96* 98  --   --   --   --   --   --  100  CO2 25  --   --  21* 22 21*  --   --   --   --   --   --  23  GLUCOSE 103* 118*  --  221* 185* 215*  --   --   --   --   --   --  277*  BUN 55* 48*  --  57* 57* 55*  --   --   --   --   --   --  58*  CREATININE 1.33* 1.30*  --  1.51* 1.44* 1.47*  --   --   --   --   --   --  1.35*  CALCIUM 8.5*  --   --  8.2* 8.4* 8.4*  --   --   --   --   --   --  7.9*  MG 2.0  --   --   --   --  1.7  --   --  1.7 1.6*  --   --   --   PHOS  --   --   --   --   --  3.5  --   --  3.2 3.1  --   --   --    < > = values in this interval not displayed.   GFR: Estimated Creatinine Clearance: 38.8 mL/min (A) (by C-G formula based on SCr of 1.35 mg/dL (H)). Recent Labs  Lab 06/22/22 1005 06/22/22 1536 06/23/22 0206  WBC  6.8  --  7.6  LATICACIDVEN  5.9* 3.6*  --     Liver Function Tests: Recent Labs  Lab 06/22/22 1005 06/23/22 0206  AST 99* 233*  ALT 61* 163*  ALKPHOS 116 103  BILITOT 1.1 2.0*  PROT 4.7* 4.8*  ALBUMIN <1.5* 1.9*   No results for input(s): "LIPASE", "AMYLASE" in the last 168 hours. No results for input(s): "AMMONIA" in the last 168 hours.  ABG    Component Value Date/Time   PHART 7.448 06/23/2022 0504   PCO2ART 32.8 06/23/2022 0504   PO2ART 149 (H) 06/23/2022 0504   HCO3 22.7 06/23/2022 0504   TCO2 24 06/23/2022 0504   ACIDBASEDEF 1.0 06/23/2022 0504   O2SAT 99 06/23/2022 0504     Coagulation Profile: No results for input(s): "INR", "PROTIME" in the last 168 hours.  Cardiac Enzymes: No results for input(s): "CKTOTAL", "CKMB", "CKMBINDEX", "TROPONINI" in the last 168 hours.  HbA1C: Hgb A1c MFr Bld  Date/Time Value Ref Range Status  05/26/2022 06:04 PM 8.3 (H) 4.8 - 5.6 % Final    Comment:    (NOTE) Pre diabetes:          5.7%-6.4%  Diabetes:              >6.4%  Glycemic control for   <7.0% adults with diabetes   01/02/2022 03:46 AM 6.0 (H) 4.8 - 5.6 % Final    Comment:    (NOTE) Pre diabetes:          5.7%-6.4%  Diabetes:              >6.4%  Glycemic control for   <7.0% adults with diabetes     CBG: Recent Labs  Lab 06/23/22 1155 06/23/22 1559 06/23/22 2011 06/23/22 2348 06/24/22 0324  GLUCAP 145* 201* 209* 275* 264*    Critical care time: 45 minutes     CRITICAL CARE Performed by: Freddi Starr   Total critical care time: 45 minutes  Critical care time was exclusive of separately billable procedures and treating other patients.  Critical care was necessary to treat or prevent imminent or life-threatening deterioration.  Critical care was time spent personally by me on the following activities: development of treatment plan with patient and/or surrogate as well as nursing, discussions with consultants, evaluation of patient's  response to treatment, examination of patient, obtaining history from patient or surrogate, ordering and performing treatments and interventions, ordering and review of laboratory studies, ordering and review of radiographic studies, pulse oximetry and re-evaluation of patient's condition.   Freda Jackson, MD [facility] Office: [phone]   See Amion for personal pager PCCM on call pager [phone] until 7pm. Please call Elink 7p-7a. [phone]

## 2022-06-24 NOTE — Consult Note (Signed)
Consultation Note Date: 06/25/2022  Patient Name: Charlene Houston  DOB: 01-27-1946  MRN: [id number]  Age / Sex: 77 y.o., female  PCP: Lilian Coma., MD Referring Physician: Freddi Starr, MD  Reason for Consultation: Establishing goals of care  HPI/Patient Profile: 77 y.o. female  with past medical history of hypertension, HFrEF, diabetes, stage 3a breast cancer, NSCLC of L lung with metastatic spread to R lung diagnosed 2022 (initially treated with Keytruda but developed pneumonitis), arthritis, SBO admitted on 06/12/2022 with RLL pneumonia with septic shock. Sacral wound and also with Metapneumonvirus and possible UTI.   Clinical Assessment and Goals of Care: Consult received and chart review completed. Discussed with RN and PCCM. I met today with daughter, Davy Pique. Sonya shared about her mother and her mother's health progression. Sonya and her brother have been trying to care for her at home but she has had abrupt decline over these past couple months. We discussed quality of life, expectations, and suffering. After discussing Davy Pique tells me that she does not want her mother to suffer and her mother would not want this. She reflects if intubating her to begin with was even the right decision but in the moment they had no time to truly think it through. I reassured her that she has made appropriate decisions for her mother throughout with the information she had at the time.   We further reviewed options moving forward. After discussion we decided to continue to optimize with plan for one way extubation tomorrow. Comfort medications will be made available - or given if she declines in the meantime. If she declines after extubation or if she maintains not cognitively intact we will transition to full comfort care (stop antibiotics, etc) and pursue hospice placement if stable for transfer. I did explain  that I do feel that transition to comfort and hospice is most likely.   All questions/concerns addressed. Emotional support provided. Discussed with PCCM.   Primary Decision Maker NEXT OF KIN adult children (her son does not like to come to the hospital)    SUMMARY OF RECOMMENDATIONS   - DNR - Plan for one way extubation tomorrow - Full comfort care and hospice if ongoing decline or if no cognitive improvement  Code Status/Advance Care Planning: DNR   Symptom Management:  Per attending.   Prognosis:  Overall poor prognosis.   Discharge Planning: Most likely transition to hospice facility.       Primary Diagnoses: Present on Admission:  Respiratory failure ([place])   I have reviewed the medical record, interviewed the patient and family, and examined the patient. The following aspects are pertinent.  Past Medical History:  Diagnosis Date   Arthritis    Asthma    Breast cancer ([place])    Diabetes mellitus without complication ([place])    Hypertension    SBO (small bowel obstruction) ([place]) 06/2018   Social History   Socioeconomic History   Marital status: Divorced    Spouse name: Not on file   Number of children: 2  Years of education: Not on file   Highest education level: High school graduate  Occupational History   Occupation: Retired  Tobacco Use   Smoking status: Former    Types: Cigarettes    Quit date: 1995    Years since quitting: 29.2   Smokeless tobacco: Never  Vaping Use   Vaping Use: Never used  Substance and Sexual Activity   Alcohol use: Not Currently   Drug use: Not Currently   Sexual activity: Not Currently  Other Topics Concern   Not on file  Social History Narrative   Not on file   Social Determinants of Health   Financial Resource Strain: Low Risk  (01/04/2022)   Overall Financial Resource Strain (CARDIA)    Difficulty of Paying Living Expenses: Not very hard  Food Insecurity: No Food Insecurity (01/04/2022)   Hunger Vital Sign     Worried About Running Out of Food in the Last Year: Never true    Ran Out of Food in the Last Year: Never true  Transportation Needs: No Transportation Needs (01/04/2022)   PRAPARE - Hydrologist (Medical): No    Lack of Transportation (Non-Medical): No  Physical Activity: Not on file  Stress: Not on file  Social Connections: Not on file   Family History  Problem Relation Age of Onset   Hypertension Mother    Diabetes Mother    Cancer Father    Scheduled Meds:  Chlorhexidine Gluconate Cloth  6 each Topical Daily   famotidine  20 mg Per Tube Daily   feeding supplement (PROSource TF20)  60 mL Per Tube Daily   insulin aspart  0-20 Units Subcutaneous Q4H   insulin aspart  2 Units Subcutaneous Q4H   insulin glargine-yfgn  10 Units Subcutaneous BID   multivitamin with minerals  1 tablet Per Tube Daily   mouth rinse  15 mL Mouth Rinse Q2H   sodium hypochlorite   Topical BID   Continuous Infusions:  ceFEPime (MAXIPIME) IV 2 g (06/24/22 1029)   feeding supplement (VITAL 1.5 CAL) 50 mL/hr at 06/24/22 1000   fentaNYL infusion INTRAVENOUS 50 mcg/hr (06/24/22 1000)   norepinephrine (LEVOPHED) Adult infusion 14 mcg/min (06/24/22 1000)   vancomycin 750 mg (06/24/22 1204)   vasopressin Stopped (06/24/22 0934)   PRN Meds:.docusate, fentaNYL, midazolam, mouth rinse, polyethylene glycol Allergies  Allergen Reactions   Prozac [Fluoxetine Hcl] Other (See Comments)    Jitters   Safflower Oil Other (See Comments)    Unknown reaction Documented on MAR   Review of Systems  Unable to perform ROS: Acuity of condition    Physical Exam Vitals and nursing note reviewed.  Constitutional:      General: She is not in acute distress.    Appearance: She is ill-appearing.     Interventions: She is intubated.  Cardiovascular:     Rate and Rhythm: Tachycardia present.  Pulmonary:     Effort: No tachypnea or accessory muscle usage. She is intubated.  Neurological:      Comments: Alert at times. Not following commands.      Vital Signs: BP (!) 74/51 Comment: increased fentanyl for bath  Pulse (!) 101   Temp 98.4 F (36.9 C)   Resp (!) 25   Ht 5\' 5"  (1.651 m)   Wt 87.9 kg   SpO2 100%   BMI 32.25 kg/m  Pain Scale: CPOT       SpO2: SpO2: 100 % O2 Device:SpO2: 100 % O2 Flow Rate: .O2  Flow Rate (L/min): 3 L/min  IO: Intake/output summary:  Intake/Output Summary (Last 24 hours) at 06/24/2022 1547 Last data filed at 06/24/2022 1400 Gross per 24 hour  Intake 1908.56 ml  Output 1655 ml  Net 253.56 ml    LBM: Last BM Date :  (PTA) Baseline Weight: Weight: 90.4 kg Most recent weight: Weight: 87.9 kg     Palliative Assessment/Data:     Time In: 1100  Time Total: 80 min  Greater than 50%  of this time was spent counseling and coordinating care related to the above assessment and plan.  Signed by: Vinie Sill, NP Palliative Medicine Team Pager # [phone] (M-F 8a-5p) Team Phone # [phone] (Nights/Weekends)

## 2022-06-25 DIAGNOSIS — A419 Sepsis, unspecified organism: Secondary | ICD-10-CM | POA: Diagnosis not present

## 2022-06-25 DIAGNOSIS — Z515 Encounter for palliative care: Secondary | ICD-10-CM

## 2022-06-25 DIAGNOSIS — R6521 Severe sepsis with septic shock: Secondary | ICD-10-CM | POA: Diagnosis not present

## 2022-06-25 DIAGNOSIS — E440 Moderate protein-calorie malnutrition: Secondary | ICD-10-CM | POA: Diagnosis not present

## 2022-06-25 DIAGNOSIS — Z7189 Other specified counseling: Secondary | ICD-10-CM

## 2022-06-25 DIAGNOSIS — J123 Human metapneumovirus pneumonia: Secondary | ICD-10-CM | POA: Diagnosis not present

## 2022-06-25 DIAGNOSIS — J9601 Acute respiratory failure with hypoxia: Secondary | ICD-10-CM | POA: Diagnosis not present

## 2022-06-25 DIAGNOSIS — J189 Pneumonia, unspecified organism: Secondary | ICD-10-CM | POA: Diagnosis not present

## 2022-06-25 LAB — POTASSIUM: Potassium: 3.3 mmol/L — ABNORMAL LOW (ref 3.5–5.1)

## 2022-06-25 LAB — CBC WITH DIFFERENTIAL/PLATELET
Abs Immature Granulocytes: 0.36 10*3/uL — ABNORMAL HIGH (ref 0.00–0.07)
Basophils Absolute: 0.1 10*3/uL (ref 0.0–0.1)
Basophils Relative: 1 %
Eosinophils Absolute: 0 10*3/uL (ref 0.0–0.5)
Eosinophils Relative: 0 %
HCT: 26.2 % — ABNORMAL LOW (ref 36.0–46.0)
Hemoglobin: 8.3 g/dL — ABNORMAL LOW (ref 12.0–15.0)
Immature Granulocytes: 4 %
Lymphocytes Relative: 11 %
Lymphs Abs: 0.9 10*3/uL (ref 0.7–4.0)
MCH: 31.2 pg (ref 26.0–34.0)
MCHC: 31.7 g/dL (ref 30.0–36.0)
MCV: 98.5 fL (ref 80.0–100.0)
Monocytes Absolute: 0.3 10*3/uL (ref 0.1–1.0)
Monocytes Relative: 3 %
Neutro Abs: 7.1 10*3/uL (ref 1.7–7.7)
Neutrophils Relative %: 81 %
Platelets: 14 10*3/uL — CL (ref 150–400)
RBC: 2.66 MIL/uL — ABNORMAL LOW (ref 3.87–5.11)
RDW: 16.6 % — ABNORMAL HIGH (ref 11.5–15.5)
WBC: 8.8 10*3/uL (ref 4.0–10.5)
nRBC: 6.2 % — ABNORMAL HIGH (ref 0.0–0.2)

## 2022-06-25 LAB — BASIC METABOLIC PANEL
Anion gap: 13 (ref 5–15)
BUN: 64 mg/dL — ABNORMAL HIGH (ref 8–23)
CO2: 21 mmol/L — ABNORMAL LOW (ref 22–32)
Calcium: 8.3 mg/dL — ABNORMAL LOW (ref 8.9–10.3)
Chloride: 104 mmol/L (ref 98–111)
Creatinine, Ser: 1.27 mg/dL — ABNORMAL HIGH (ref 0.44–1.00)
GFR, Estimated: 44 mL/min — ABNORMAL LOW (ref >60)
Glucose, Bld: 381 mg/dL — ABNORMAL HIGH (ref 70–99)
Potassium: 3.7 mmol/L (ref 3.5–5.1)
Sodium: 138 mmol/L (ref 135–145)

## 2022-06-25 LAB — GLUCOSE, CAPILLARY
Glucose-Capillary: 322 mg/dL — ABNORMAL HIGH (ref 70–99)
Glucose-Capillary: 360 mg/dL — ABNORMAL HIGH (ref 70–99)
Glucose-Capillary: 306 mg/dL — ABNORMAL HIGH (ref 70–99)
Glucose-Capillary: 261 mg/dL — ABNORMAL HIGH (ref 70–99)
Glucose-Capillary: 214 mg/dL — ABNORMAL HIGH (ref 70–99)
Glucose-Capillary: 242 mg/dL — ABNORMAL HIGH (ref 70–99)

## 2022-06-25 MED ORDER — PANTOPRAZOLE SODIUM 40 MG IV SOLR
40.0000 mg | Freq: Every day | INTRAVENOUS | Status: DC
  Administered 2022-06-25 – 2022-06-26 (×2): 40 mg via INTRAVENOUS
  Filled 2022-06-25 (×2): qty 10

## 2022-06-25 MED ORDER — INSULIN ASPART 100 UNIT/ML IJ SOLN: 5.0000 [IU] | INTRAMUSCULAR | Status: DC

## 2022-06-25 MED ORDER — MEDIHONEY WOUND/BURN DRESSING EX PSTE
1.0000 | PASTE | Freq: Every day | CUTANEOUS | Status: DC
  Administered 2022-06-25: 1 via TOPICAL
  Filled 2022-06-25 (×2): qty 44

## 2022-06-25 MED ORDER — INSULIN GLARGINE-YFGN 100 UNIT/ML ~~LOC~~ SOLN
20.0000 [IU] | Freq: Two times a day (BID) | SUBCUTANEOUS | Status: DC
  Administered 2022-06-25 – 2022-06-26 (×3): 20 [IU] via SUBCUTANEOUS
  Filled 2022-06-25 (×4): qty 0.2

## 2022-06-25 MED ORDER — INSULIN ASPART 100 UNIT/ML IJ SOLN
5.0000 [IU] | INTRAMUSCULAR | Status: DC
  Administered 2022-06-25 – 2022-06-26 (×7): 5 [IU] via SUBCUTANEOUS

## 2022-06-25 MED ORDER — POTASSIUM CHLORIDE 20 MEQ PO PACK
60.0000 meq | PACK | Freq: Two times a day (BID) | ORAL | Status: AC
  Administered 2022-06-25 – 2022-06-26 (×2): 60 meq
  Filled 2022-06-25 (×2): qty 3

## 2022-06-25 NOTE — Progress Notes (Signed)
NAME:  Charlene Houston, MRN:  [id number], DOB:  1945-06-02, LOS: 3 ADMISSION DATE:  07/10/2022, CONSULTATION DATE:  06/25/22 REFERRING MD:  EDP  CHIEF COMPLAINT:  shortness of breath   History of Present Illness:  Charlene Houston is a 77 y.o. 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  She developed pneumonitis and treatment was discontinued.  She subsequently had a decline in baseline functional status and has been residing in nursing facility with recent admission for  Klebsiella UTI.  She presented to the ED on 3/12 with respiratory distress and altered mental status.  She was intubated on arrival to the ED, work-up significant for RLL pneumonia on CT and lactic acid of 5.9, K 6.6, creatinine 1.3, ALT 99, AST 61, WBC 6.8, troponin 205, pH 7.5, pO2 505.  She was treated with IVF and broad spectrum antibiotics and required Levophed for blood pressure support. PCCM consulted for admission.   She has a known sacral decubitus ulcer, no gas tracking or abscess on CT.  Pertinent  Medical History   has a past medical history of Arthritis, Asthma, Breast cancer ([place]), Diabetes mellitus without complication ([place]), Hypertension, and SBO (small bowel obstruction) ([place]) (06/2018).   Significant Hospital Events: Including procedures, antibiotic start and stop dates in addition to other pertinent events   3/12 admit to PCCM with septic shock on levophed   Interim History / Subjective:   No acute events overnight Remains sedated, intubated and on pressors   Objective   Blood pressure (!) 117/100, pulse (!) 101, temperature 98.4 F (36.9 C), resp. rate 20, height 5\' 5"  (1.651 m), weight 88.6 kg, SpO2 100 %.    Vent Mode: CPAP;PSV FiO2 (%):  [40 %] 40 % Set Rate:  [25 bmp] 25 bmp Vt Set:  [450 mL] 450 mL PEEP:  [5 cmH20] 5 cmH20 Pressure  Support:  [10 cmH20] 10 cmH20 Plateau Pressure:  [24 cmH20-27 cmH20] 24 cmH20   Intake/Output Summary (Last 24 hours) at 06/25/2022 [id number] Last data filed at 06/25/2022 0800 Gross per 24 hour  Intake 2205.83 ml  Output 2045 ml  Net 160.83 ml    Filed Weights   06/23/22 1003 06/24/22 0500 06/25/22 0438  Weight: 87.2 kg 87.9 kg 88.6 kg    General:  chronically ill female, in ICU bed  HEENT: MM pink/moist, ETT/OG tube Neuro: PERRLA intact, triggering vent, responds to pain  CV: s1s2 auscultated, rrr, no m/r/g PULM: inspiratory/expiratory wheezing, no respiratory distress  GI/GU: soft, active BS in all quadrants/OG tube, indwelling foley catheter Extremities: warm/dry, 2+ pitting edema in all extremities  Skin: no rashes or lesions, large sacral decub     Resolved Hospital Problem list   hyperkalemia   Assessment & Plan:   Acute Hypoxic Respiratory Failure Secondary to R-sided pneumonia  Metapneumo virus + Patient vented ABG 3/13 7.448, Pco2 32.8, Po2 149, Bicarb 22.7  P:  Continue to Maintain full vent support with SAT/SBT as tolerated Titrate Vent setting to maintain SpO2 greater than or equal to 90%. HOB elevated 30 degrees. Continue Follow chest x-ray Prn  Repeat ABG  Bronchial hygiene and RT/bronchodilator protocol. Continue vancomycin and cefepime  Septic Shock  High suspicion due to right sided pna, decubitus ulcer  Urine strep ag is negative Urine culture growing multiple species-contaminant  Blood cultures drawn on admission are  growing Enterococcus faecalis MRSE and Proteus from 1 of 2 sites.  P:  ID following  continue vancomycin and cefepime.  continue levophed and vasopressin to maintain MAP >65  Continue to follow repeat BCs  Chronic HFrEF with LBBB Elevated troponin Troponin 206 EKG with LBBB though not new Lasix given yesterday  1/12 24 Last ECHO EF 30 to 35%. The left ventricle has  moderately decreased function.  P: Repeat troponins Hold  lasix  Consider adding Albumin  Order Limit ECHO   Type 2 DM Hyperglycemia  BS >300s  On tube feeds P:  Continue SSI Increase meal coverage from 2 to 5 units  Increase Long acting from 10-20 units  May need to increase SSI coverage or start insulin gtt   AKI secondary to Septic Shock  Baseline creatinine .8-.9, 1.3 upon admission 3/15 Cr 1.27  P:  Continue to follow BMP and UOP, avoid nephrotoxins Adequate perfusion with vasopressors, MAP >65   Elevated transaminases  Probably secondary to Septic Shock  cholelithiasis noted on CT 3/13 AST 233, ALT 163  P:  Repeat Hepatic Function panel/Total bili  May need RUQ ultrasound   Decubitus Ulcer, POA Extensive Decubitus ulcer  P: Re-consult Wound Care Management  Will probably need surgical debridement if remain aggressive GOC needs to be addressed, Palliative following   Severe Protein Malnutrition  On Tube Feeds, total protein 1.9  P: Continue tube feeds Dietitian Consulted    Best Practice (right click and "Reselect all SmartList Selections" daily)   Diet/type: tubefeeds DVT prophylaxis: prophylactic heparin  GI prophylaxis: PPI Lines: N/A Foley:  Yes, and it is still needed Code Status:  DNR Last date of multidisciplinary goals of care discussion [Will update daughter, currently not at bedside.   Labs   CBC: Recent Labs  Lab 06/23/2022 1005 06/29/2022 1044 07/10/2022 1106 06/23/22 0206 06/23/22 0504 06/24/22 1154  WBC 6.8  --   --  7.6  --  7.6  NEUTROABS 5.4  --   --   --   --  6.6  HGB 11.5* 10.5* 9.2* 11.4* 8.2* 9.1*  HCT 36.7 31.0* 27.0* 36.5 24.0* 29.1*  MCV 101.9*  --   --  100.0  --  97.3  PLT PLATELET CLUMPS NOTED ON SMEAR, COUNT APPEARS DECREASED  --   --  32*  --  16*     Basic Metabolic Panel: Recent Labs  Lab 06/23/2022 2325 06/23/22 0206 06/23/22 0504 06/23/22 0622 06/23/22 1025 06/23/22 1438 06/23/22 1905 06/23/22 2340 06/24/22 0723 06/24/22 0724 06/24/22 1154 06/24/22 1545  06/24/22 1836 06/25/22 0631  NA 134* 133* 132*  --   --   --   --  134* 139  --   --   --   --  138  K 6.7* 5.7* 4.5   < > 5.8* 5.4*   < > 3.8 3.9 3.8 3.5 3.4* 3.6 3.7  CL 96* 98  --   --   --   --   --  100 103  --   --   --   --  104  CO2 22 21*  --   --   --   --   --  23 17*  --   --   --   --  21*  GLUCOSE 185* 215*  --   --   --   --   --  277* 331*  --   --   --   --  381*  BUN  57* 55*  --   --   --   --   --  58* 61*  --   --   --   --  64*  CREATININE 1.44* 1.47*  --   --   --   --   --  1.35* 1.30*  --   --   --   --  1.27*  CALCIUM 8.4* 8.4*  --   --   --   --   --  7.9* 8.2*  --   --   --   --  8.3*  MG  --  1.7  --   --  1.7 1.6*  --   --  2.0  --   --  2.2  --   --   PHOS  --  3.5  --   --  3.2 3.1  --   --  2.5  --   --  2.2*  --   --    < > = values in this interval not displayed.    GFR: Estimated Creatinine Clearance: 41.4 mL/min (A) (by C-G formula based on SCr of 1.27 mg/dL (H)). Recent Labs  Lab 06/23/2022 1005 06/12/2022 1536 06/23/22 0206 06/24/22 1154  WBC 6.8  --  7.6 7.6  LATICACIDVEN 5.9* 3.6*  --   --      Liver Function Tests: Recent Labs  Lab 07/03/2022 1005 06/23/22 0206  AST 99* 233*  ALT 61* 163*  ALKPHOS 116 103  BILITOT 1.1 2.0*  PROT 4.7* 4.8*  ALBUMIN <1.5* 1.9*    No results for input(s): "LIPASE", "AMYLASE" in the last 168 hours. No results for input(s): "AMMONIA" in the last 168 hours.  ABG    Component Value Date/Time   PHART 7.448 06/23/2022 0504   PCO2ART 32.8 06/23/2022 0504   PO2ART 149 (H) 06/23/2022 0504   HCO3 22.7 06/23/2022 0504   TCO2 24 06/23/2022 0504   ACIDBASEDEF 1.0 06/23/2022 0504   O2SAT 99 06/23/2022 0504     Coagulation Profile: No results for input(s): "INR", "PROTIME" in the last 168 hours.  Cardiac Enzymes: No results for input(s): "CKTOTAL", "CKMB", "CKMBINDEX", "TROPONINI" in the last 168 hours.  HbA1C: Hgb A1c MFr Bld  Date/Time Value Ref Range Status  05/26/2022 06:04 PM 8.3 (H) 4.8 - 5.6 %  Final    Comment:    (NOTE) Pre diabetes:          5.7%-6.4%  Diabetes:              >6.4%  Glycemic control for   <7.0% adults with diabetes   01/02/2022 03:46 AM 6.0 (H) 4.8 - 5.6 % Final    Comment:    (NOTE) Pre diabetes:          5.7%-6.4%  Diabetes:              >6.4%  Glycemic control for   <7.0% adults with diabetes     CBG: Recent Labs  Lab 06/24/22 1541 06/24/22 1933 06/24/22 2329 06/25/22 0340 06/25/22 0752  GLUCAP 310* 329* 328* 322* 360*     Critical care time: 45 minutes     CRITICAL CARE Performed by: [name] BSN, RN, S-ACNP    Total critical care time: 45 minutes  Critical care time was exclusive of separately billable procedures and treating other patients.  Critical care was necessary to treat or prevent imminent or life-threatening deterioration.  Critical care was time spent personally by me on the following  activities: development of treatment plan with patient and/or surrogate as well as nursing, discussions with consultants, evaluation of patient's response to treatment, examination of patient, obtaining history from patient or surrogate, ordering and performing treatments and interventions, ordering and review of laboratory studies, ordering and review of radiographic studies, pulse oximetry and re-evaluation of patient's condition.

## 2022-06-25 NOTE — Consult Note (Addendum)
WOC Nurse Consult Note: Reason for Consult: sacral and bilateral buttocks wounds  Wound type: 1.  Sacral wound Unstageable Pressure Injury,  2.  Buttocks/upper thigh wounds areas of partial and full thickness loss are Moisture Associated Skin Damage  ICD-10 CM Codes for Irritant Dermatitis L24A2 - Due to fecal, urinary or dual incontinence Pressure Injury POA: Yes Measurement:  Sacral Unstageable Pressure Injury 6  cms x 6 cms x 3 cms 100% black leathery necrotic tissue  Bilateral buttocks with scattered areas of full tissue and partial thickness skin loss, moisture associated, largest area L buttock 10 cms x 12 cms x 0.1 cm 100% yellow slough   Drainage (amount, consistency, odor) malodorous tan drainage from sacral wound, minimal serosanguinous from bilateral buttocks/thighs  Periwound: erythematous, moist  Dressing procedure/placement/frequency:  Continue Dakin's as previously ordered for Sacral wound.  Surgical consultation for this wound would be necessary for debridement of dead tissue if aggressive measures are desired.  Bilateral buttocks and upper thigh areas of partial and full thickness skin loss clean with NS, apply Medihoney to wound beds daily, cover with dry gauze, ABD pads and tape to secure.    Patient is currently on a low air loss mattress for moisture management and pressure redistribution and should remain on throughout hospitalization.  Supine position should be minimized.    POC discussed with bedside nurse.  Secure chat sent to primary MD regarding sacral wound and need for surgical consultation if aggressive treatment is desired.    WOC will not follow this patient.  Re-consult if further needs arise.   Thank you,    Morganna Styles MSN, RN-BC, [employer]

## 2022-06-25 NOTE — Progress Notes (Signed)
Subjective:  Patient is intubated on the ventilator  Antibiotics:  Anti-infectives (From admission, onward)    Start     Dose/Rate Route Frequency Ordered Stop   06/24/22 2100  vancomycin (VANCOREADY) IVPB 1250 mg/250 mL  Status:  Discontinued        1,250 mg 166.7 mL/hr over 90 Minutes Intravenous Every 48 hours 06/23/22 0716 06/24/22 1100   06/24/22 1200  vancomycin (VANCOREADY) IVPB 750 mg/150 mL        750 mg 150 mL/hr over 60 Minutes Intravenous Every 24 hours 06/24/22 1100     06/23/22 2200  vancomycin (VANCOREADY) IVPB 750 mg/150 mL  Status:  Discontinued        750 mg 150 mL/hr over 60 Minutes Intravenous Every 24 hours 06/19/2022 1110 06/23/22 0716   06/23/22 1015  doxycycline (VIBRAMYCIN) 100 mg in sodium chloride 0.9 % 250 mL IVPB  Status:  Discontinued        100 mg 125 mL/hr over 120 Minutes Intravenous Every 12 hours 06/23/22 0921 06/24/22 1018   06/23/22 0945  azithromycin (ZITHROMAX) 500 mg in sodium chloride 0.9 % 250 mL IVPB  Status:  Discontinued        500 mg 250 mL/hr over 60 Minutes Intravenous Every 24 hours 06/23/22 0848 06/23/22 0921   07/07/2022 2300  ceFEPIme (MAXIPIME) 2 g in sodium chloride 0.9 % 100 mL IVPB        2 g 200 mL/hr over 30 Minutes Intravenous Every 12 hours 07/03/2022 1110     06/20/2022 1045  vancomycin (VANCOREADY) IVPB 1500 mg/300 mL        1,500 mg 150 mL/hr over 120 Minutes Intravenous  Once 06/24/2022 1035 06/17/2022 2354   06/13/2022 1015  ceFEPIme (MAXIPIME) 2 g in sodium chloride 0.9 % 100 mL IVPB        2 g 200 mL/hr over 30 Minutes Intravenous  Once 07/07/2022 1008 06/13/2022 1143   06/21/2022 1015  metroNIDAZOLE (FLAGYL) IVPB 500 mg        500 mg 100 mL/hr over 60 Minutes Intravenous  Once 06/19/2022 1008 06/19/2022 1341   06/25/2022 1015  vancomycin (VANCOCIN) IVPB 1000 mg/200 mL premix  Status:  Discontinued        1,000 mg 200 mL/hr over 60 Minutes Intravenous  Once 06/11/2022 1008 06/24/2022 1035       Medications: Scheduled  Meds:  Chlorhexidine Gluconate Cloth  6 each Topical Daily   feeding supplement (PROSource TF20)  60 mL Per Tube Daily   insulin aspart  0-20 Units Subcutaneous Q4H   insulin aspart  5 Units Subcutaneous Q4H   insulin glargine-yfgn  20 Units Subcutaneous BID   leptospermum manuka honey  1 Application Topical Daily   multivitamin with minerals  1 tablet Per Tube Daily   mouth rinse  15 mL Mouth Rinse Q2H   pantoprazole (PROTONIX) IV  40 mg Intravenous Daily   sodium hypochlorite   Topical BID   Continuous Infusions:  ceFEPime (MAXIPIME) IV Stopped (06/25/22 1202)   feeding supplement (VITAL 1.5 CAL) 50 mL/hr at 06/25/22 1255   fentaNYL infusion INTRAVENOUS Stopped (06/25/22 0544)   norepinephrine (LEVOPHED) Adult infusion 9 mcg/min (06/25/22 1255)   vancomycin 150 mL/hr at 06/25/22 1255   vasopressin Stopped (06/25/22 0837)   PRN Meds:.docusate, fentaNYL, midazolam, mouth rinse, polyethylene glycol    Objective: Weight change: 1.4 kg  Intake/Output Summary (Last 24 hours) at 06/25/2022 1537 Last data filed at 06/25/2022 1255 Gross  per 24 hour  Intake 2790.5 ml  Output 1695 ml  Net 1095.5 ml   Blood pressure 99/60, pulse (!) 103, temperature 98.8 F (37.1 C), resp. rate 14, height 5\' 5"  (1.651 m), weight 88.6 kg, SpO2 100 %. Temp:  [98.2 F (36.8 C)-99.1 F (37.3 C)] 98.8 F (37.1 C) (03/15 1500) Pulse Rate:  [83-109] 103 (03/15 1500) Resp:  [13-27] 14 (03/15 1500) BP: (57-140)/(39-100) 99/60 (03/15 1500) SpO2:  [84 %-100 %] 100 % (03/15 1500) FiO2 (%):  [40 %] 40 % (03/15 1533) Weight:  [88.6 kg] 88.6 kg (03/15 0438)  Physical Exam: Physical Exam Constitutional:      Interventions: She is intubated.  HENT:     Head: Normocephalic and atraumatic.  Eyes:     General:        Right eye: No discharge.        Left eye: No discharge.  Cardiovascular:     Rate and Rhythm: Tachycardia present.  Pulmonary:     Effort: She is intubated.     Breath sounds: No wheezing.   Abdominal:     General: There is no distension.  Neurological:     General: No focal deficit present.     Mental Status: She is alert.      CBC:    BMET Recent Labs    06/24/22 0723 06/24/22 0724 06/24/22 1836 06/25/22 0631  NA 139  --   --  138  K 3.9   < > 3.6 3.7  CL 103  --   --  104  CO2 17*  --   --  21*  GLUCOSE 331*  --   --  381*  BUN 61*  --   --  64*  CREATININE 1.30*  --   --  1.27*  CALCIUM 8.2*  --   --  8.3*   < > = values in this interval not displayed.     Liver Panel  Recent Labs    06/23/22 0206  PROT 4.8*  ALBUMIN 1.9*  AST 233*  ALT 163*  ALKPHOS 103  BILITOT 2.0*       Sedimentation Rate No results for input(s): "ESRSEDRATE" in the last 72 hours. C-Reactive Protein No results for input(s): "CRP" in the last 72 hours.  Micro Results: Recent Results (from the past 720 hour(s))  Blood culture (routine x 2)     Status: None   Collection Time: 05/26/22  6:05 PM   Specimen: BLOOD  Result Value Ref Range Status   Specimen Description BLOOD BLOOD RIGHT HAND  Final   Special Requests   Final    BOTTLES DRAWN AEROBIC AND ANAEROBIC Blood Culture adequate volume   Culture   Final    NO GROWTH 5 DAYS Performed at [facility], [address], [place], [place] [address]    Report Status 05/31/2022 FINAL  Final  Urine Culture (for pregnant, neutropenic or urologic patients or patients with an indwelling urinary catheter)     Status: Abnormal   Collection Time: 05/27/22  9:25 AM   Specimen: Urine, Clean Catch  Result Value Ref Range Status   Specimen Description URINE, CLEAN CATCH  Final   Special Requests NONE  Final   Culture (A)  Final    >=100,000 COLONIES/mL KLEBSIELLA PNEUMONIAE Two isolates with different morphologies were identified as the same organism.The most resistant organism was reported. Performed at [facility], [place] [address], [place], [place] [address]    Report Status  05/29/2022 FINAL  Final    Organism ID, Bacteria KLEBSIELLA PNEUMONIAE (A)  Final      Susceptibility   Klebsiella pneumoniae - MIC*    AMPICILLIN >=32 RESISTANT Resistant     CEFAZOLIN <=4 SENSITIVE Sensitive     CEFEPIME <=0.12 SENSITIVE Sensitive     CEFTRIAXONE <=0.25 SENSITIVE Sensitive     CIPROFLOXACIN <=0.25 SENSITIVE Sensitive     GENTAMICIN <=1 SENSITIVE Sensitive     IMIPENEM <=0.25 SENSITIVE Sensitive     NITROFURANTOIN 64 INTERMEDIATE Intermediate     TRIMETH/SULFA <=20 SENSITIVE Sensitive     AMPICILLIN/SULBACTAM 4 SENSITIVE Sensitive     PIP/TAZO <=4 SENSITIVE Sensitive     * >=100,000 COLONIES/mL KLEBSIELLA PNEUMONIAE  Blood Culture (routine x 2)     Status: None (Preliminary result)   Collection Time: 07/05/2022 10:05 AM   Specimen: BLOOD  Result Value Ref Range Status   Specimen Description BLOOD LEFT ANTECUBITAL  Final   Special Requests   Final    BOTTLES DRAWN AEROBIC AND ANAEROBIC Blood Culture results may not be optimal due to an inadequate volume of blood received in culture bottles   Culture  Setup Time   Final    GRAM NEGATIVE RODS GRAM POSITIVE COCCI IN BOTH AEROBIC AND ANAEROBIC BOTTLES CRITICAL RESULT CALLED TO, READ BACK BY AND VERIFIED WITH: PHARMD G. ABBOTT 06/23/22 @ 0210 BY AB    Culture   Final    GRAM NEGATIVE RODS GRAM POSITIVE COCCI IDENTIFICATION AND SUSCEPTIBILITIES TO FOLLOW Performed at [facility], [address] [place], [place] [address]    Report Status PENDING  Incomplete  Blood Culture ID Panel (Reflexed)     Status: Abnormal   Collection Time: 07/09/2022 10:05 AM  Result Value Ref Range Status   Enterococcus faecalis DETECTED (A) NOT DETECTED Final    Comment: CRITICAL RESULT CALLED TO, READ BACK BY AND VERIFIED WITH: PHARMD G. ABBOTT 06/23/22 @ 0210 BY AB    Enterococcus Faecium NOT DETECTED NOT DETECTED Final   Listeria monocytogenes NOT DETECTED NOT DETECTED Final   Staphylococcus species DETECTED (A) NOT DETECTED Final    Comment: CRITICAL  RESULT CALLED TO, READ BACK BY AND VERIFIED WITH: PHARMD G. ABBOTT 06/23/22 @ 0210 BY AB    Staphylococcus aureus (BCID) NOT DETECTED NOT DETECTED Final   Staphylococcus epidermidis DETECTED (A) NOT DETECTED Final    Comment: Methicillin (oxacillin) resistant coagulase negative staphylococcus. Possible blood culture contaminant (unless isolated from more than one blood culture draw or clinical case suggests pathogenicity). No antibiotic treatment is indicated for blood  culture contaminants. CRITICAL RESULT CALLED TO, READ BACK BY AND VERIFIED WITH: PHARMD G. ABBOTT 06/23/22 @ 0210 BY AB    Staphylococcus lugdunensis NOT DETECTED NOT DETECTED Final   Streptococcus species NOT DETECTED NOT DETECTED Final   Streptococcus agalactiae NOT DETECTED NOT DETECTED Final   Streptococcus pneumoniae NOT DETECTED NOT DETECTED Final   Streptococcus pyogenes NOT DETECTED NOT DETECTED Final   A.calcoaceticus-baumannii NOT DETECTED NOT DETECTED Final   Bacteroides fragilis NOT DETECTED NOT DETECTED Final   Enterobacterales DETECTED (A) NOT DETECTED Final    Comment: Enterobacterales represent a large order of gram negative bacteria, not a single organism. CRITICAL RESULT CALLED TO, READ BACK BY AND VERIFIED WITH: PHARMD G. ABBOTT 06/23/22 @ 0210 BY AB    Enterobacter cloacae complex NOT DETECTED NOT DETECTED Final   Escherichia coli NOT DETECTED NOT DETECTED Final   Klebsiella aerogenes NOT DETECTED NOT DETECTED Final  Klebsiella oxytoca NOT DETECTED NOT DETECTED Final   Klebsiella pneumoniae NOT DETECTED NOT DETECTED Final   Proteus species DETECTED (A) NOT DETECTED Final    Comment: CRITICAL RESULT CALLED TO, READ BACK BY AND VERIFIED WITH: PHARMD G. ABBOTT 06/23/22 @ 0210 BY AB    Salmonella species NOT DETECTED NOT DETECTED Final   Serratia marcescens NOT DETECTED NOT DETECTED Final   Haemophilus influenzae NOT DETECTED NOT DETECTED Final   Neisseria meningitidis NOT DETECTED NOT DETECTED Final    Pseudomonas aeruginosa NOT DETECTED NOT DETECTED Final   Stenotrophomonas maltophilia NOT DETECTED NOT DETECTED Final   Candida albicans NOT DETECTED NOT DETECTED Final   Candida auris NOT DETECTED NOT DETECTED Final   Candida glabrata NOT DETECTED NOT DETECTED Final   Candida krusei NOT DETECTED NOT DETECTED Final   Candida parapsilosis NOT DETECTED NOT DETECTED Final   Candida tropicalis NOT DETECTED NOT DETECTED Final   Cryptococcus neoformans/gattii NOT DETECTED NOT DETECTED Final   CTX-M ESBL NOT DETECTED NOT DETECTED Final   Carbapenem resistance IMP NOT DETECTED NOT DETECTED Final   Carbapenem resistance KPC NOT DETECTED NOT DETECTED Final   Methicillin resistance mecA/C DETECTED (A) NOT DETECTED Final    Comment: CRITICAL RESULT CALLED TO, READ BACK BY AND VERIFIED WITH: PHARMD G. ABBOTT 06/23/22 @ 0210 BY AB    Carbapenem resistance NDM NOT DETECTED NOT DETECTED Final   Carbapenem resist OXA 48 LIKE NOT DETECTED NOT DETECTED Final   Vancomycin resistance NOT DETECTED NOT DETECTED Final   Carbapenem resistance VIM NOT DETECTED NOT DETECTED Final    Comment: Performed at [facility], [address] [place], [place] [address]  Resp panel by RT-PCR (RSV, Flu A&B, Covid) Anterior Nasal Swab     Status: None   Collection Time: 06/11/2022 10:07 AM   Specimen: Anterior Nasal Swab  Result Value Ref Range Status   SARS Coronavirus 2 by RT PCR NEGATIVE NEGATIVE Final   Influenza A by PCR NEGATIVE NEGATIVE Final   Influenza B by PCR NEGATIVE NEGATIVE Final    Comment: (NOTE) The Xpert Xpress SARS-CoV-2/FLU/RSV plus assay is intended as an aid in the diagnosis of influenza from Nasopharyngeal swab specimens and should not be used as a sole basis for treatment. Nasal washings and aspirates are unacceptable for Xpert Xpress SARS-CoV-2/FLU/RSV testing.  Fact Sheet for Patients: EntrepreneurPulse.com.au  Fact Sheet for Healthcare  Providers: IncredibleEmployment.be  This test is not yet approved or cleared by the [place] FDA and has been authorized for detection and/or diagnosis of SARS-CoV-2 by FDA under an Emergency Use Authorization (EUA). This EUA will remain in effect (meaning this test can be used) for the duration of the COVID-19 declaration under Section 564(b)(1) of the Act, 21 U.S.C. section 360bbb-3(b)(1), unless the authorization is terminated or revoked.     Resp Syncytial Virus by PCR NEGATIVE NEGATIVE Final    Comment: (NOTE) Fact Sheet for Patients: EntrepreneurPulse.com.au  Fact Sheet for Healthcare Providers: IncredibleEmployment.be  This test is not yet approved or cleared by the [place] FDA and has been authorized for detection and/or diagnosis of SARS-CoV-2 by FDA under an Emergency Use Authorization (EUA). This EUA will remain in effect (meaning this test can be used) for the duration of the COVID-19 declaration under Section 564(b)(1) of the Act, 21 U.S.C. section 360bbb-3(b)(1), unless the authorization is terminated or revoked.  Performed at [facility], [place] [address] [place], [place] [address]   Respiratory (~20 pathogens) panel by PCR  Status: Abnormal   Collection Time: 06/14/2022  2:15 PM   Specimen: Nasopharyngeal Swab; Respiratory  Result Value Ref Range Status   Adenovirus NOT DETECTED NOT DETECTED Final   Coronavirus 229E NOT DETECTED NOT DETECTED Final    Comment: (NOTE) The Coronavirus on the Respiratory Panel, DOES NOT test for the novel  Coronavirus (2019 nCoV)    Coronavirus HKU1 NOT DETECTED NOT DETECTED Final   Coronavirus NL63 NOT DETECTED NOT DETECTED Final   Coronavirus OC43 NOT DETECTED NOT DETECTED Final   Metapneumovirus DETECTED (A) NOT DETECTED Final   Rhinovirus / Enterovirus NOT DETECTED NOT DETECTED Final   Influenza A NOT DETECTED NOT DETECTED Final   Influenza B NOT  DETECTED NOT DETECTED Final   Parainfluenza Virus 1 NOT DETECTED NOT DETECTED Final   Parainfluenza Virus 2 NOT DETECTED NOT DETECTED Final   Parainfluenza Virus 3 NOT DETECTED NOT DETECTED Final   Parainfluenza Virus 4 NOT DETECTED NOT DETECTED Final   Respiratory Syncytial Virus NOT DETECTED NOT DETECTED Final   Bordetella pertussis NOT DETECTED NOT DETECTED Final   Bordetella Parapertussis NOT DETECTED NOT DETECTED Final   Chlamydophila pneumoniae NOT DETECTED NOT DETECTED Final   Mycoplasma pneumoniae NOT DETECTED NOT DETECTED Final    Comment: Performed at [facility], [address] [place], [place] [address]  MRSA Next Gen by PCR, Nasal     Status: None   Collection Time: 07/07/2022  2:15 PM   Specimen: Nasal Mucosa; Nasal Swab  Result Value Ref Range Status   MRSA by PCR Next Gen NOT DETECTED NOT DETECTED Final    Comment: (NOTE) The GeneXpert MRSA Assay (FDA approved for NASAL specimens only), is one component of a comprehensive MRSA colonization surveillance program. It is not intended to diagnose MRSA infection nor to guide or monitor treatment for MRSA infections. Test performance is not FDA approved in patients less than 37 years old. Performed at [facility], [place] [address] [place], [place] [address]   Remove and replace urinary cath (placed > 5 days) then obtain urine culture from new indwelling urinary catheter.     Status: Abnormal   Collection Time: 06/13/2022  2:20 PM   Specimen: Urine, Catheterized  Result Value Ref Range Status   Specimen Description URINE, CATHETERIZED  Final   Special Requests   Final    NONE Performed at [facility], [address] [place], [place] [address]    Culture MULTIPLE SPECIES PRESENT, SUGGEST RECOLLECTION (A)  Final   Report Status 06/24/2022 FINAL  Final  Blood Culture (routine x 2)     Status: None (Preliminary result)   Collection Time: 06/16/2022  4:18 PM   Specimen: BLOOD RIGHT ARM  Result Value Ref Range  Status   Specimen Description BLOOD RIGHT ARM  Final   Special Requests   Final    BOTTLES DRAWN AEROBIC ONLY Blood Culture adequate volume   Culture   Final    NO GROWTH 3 DAYS Performed at [facility], [place] [address] [place], [place] [address]    Report Status PENDING  Incomplete  Urine Culture     Status: Abnormal   Collection Time: 07/04/2022  6:50 PM   Specimen: Urine, Catheterized  Result Value Ref Range Status   Specimen Description URINE, CATHETERIZED  Final   Special Requests   Final    NONE Reflexed from [id number] Performed at [facility], [address] [place], [place] [address]  Culture MULTIPLE SPECIES PRESENT, SUGGEST RECOLLECTION (A)  Final   Report Status 06/23/2022 FINAL  Final  Culture, blood (Routine X 2) w Reflex to ID Panel     Status: None (Preliminary result)   Collection Time: 06/24/22  3:45 PM   Specimen: BLOOD LEFT HAND  Result Value Ref Range Status   Specimen Description BLOOD LEFT HAND  Final   Special Requests   Final    BOTTLES DRAWN AEROBIC AND ANAEROBIC Blood Culture adequate volume   Culture   Final    NO GROWTH < 24 HOURS Performed at [facility], [place] [address], [place], [place] [address]    Report Status PENDING  Incomplete  Culture, blood (Routine X 2) w Reflex to ID Panel     Status: None (Preliminary result)   Collection Time: 06/24/22  3:47 PM   Specimen: BLOOD LEFT HAND  Result Value Ref Range Status   Specimen Description BLOOD LEFT HAND  Final   Special Requests   Final    BOTTLES DRAWN AEROBIC ONLY Blood Culture adequate volume   Culture   Final    NO GROWTH < 24 HOURS Performed at [facility], [place] [address], [place], [place] [address]    Report Status PENDING  Incomplete    Studies/Results: No results found.    Assessment/Plan:  INTERVAL HISTORY: patient's daughter met with palliative care   Principal Problem:   Respiratory failure ([place]) Active Problems:   Pneumonia of right lower  lobe due to infectious organism   Septic shock ([place])   Bacteremia   Bronchopneumonia due to human metapneumovirus (hMPV)   Malnutrition of moderate degree    Charlene Houston is a 77 y.o. female with hx of metastatic breast cancer previously on [place] but off since this fall, with port in place, who has been admitted with respiratory failure and septic shock.  Her respiratory panel was positive for metapneumovirus.  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 By her multidisciplinary team  I spent 52 minutes with the patient including than 50% in face to face and non-face-to-face time regarding her polymicrobial bacteremia metapneumovirus infection large decubitus ulcer along with review of medical records in preparation for the visit and during the visit and in coordination of her care.   My partner Dr. Juleen China is available for questions this weekend and I will be back on Monday.   LOS: 3 days   Alcide Evener 06/25/2022, 3:37 PM

## 2022-06-25 NOTE — Progress Notes (Signed)
This chaplain responded to PMT NP-Alicia referral for family spiritual care. The Pt. daughter-Charlene Houston is at the bedside.    The chaplain listened reflectively as Charlene Houston told many stories, with mixed emotions, of the sacrifices the Pt. made in their a loving mother-daughter relationship. The chaplain understands the Pt. faith is helping with Charlene Houston's anticipatory grief and allowing her to explore her own faith. The chaplain spoke to the Pt. as she exited and is available for F/U spiritual care as needed.  Chaplain Sallyanne Kuster [phone]

## 2022-06-25 NOTE — Progress Notes (Signed)
Patient placed on CPAP/PSV trial during 8 am first rounds. 10/5 401%. Patient placed back on full support during second rounds because no plan of extubation. Patient placed back into CPAP/PSV mode at 1215 due to peak pressures and dissynchrony. Patient tolerating CPAP/PSV much better. Will continue to wean patient until signs of distress.

## 2022-06-26 ENCOUNTER — Inpatient Hospital Stay (HOSPITAL_COMMUNITY): Payer: Medicare HMO

## 2022-06-26 DIAGNOSIS — J189 Pneumonia, unspecified organism: Secondary | ICD-10-CM | POA: Diagnosis not present

## 2022-06-26 DIAGNOSIS — Z515 Encounter for palliative care: Secondary | ICD-10-CM | POA: Diagnosis not present

## 2022-06-26 DIAGNOSIS — G9340 Encephalopathy, unspecified: Secondary | ICD-10-CM | POA: Diagnosis not present

## 2022-06-26 DIAGNOSIS — J123 Human metapneumovirus pneumonia: Secondary | ICD-10-CM | POA: Diagnosis not present

## 2022-06-26 DIAGNOSIS — A419 Sepsis, unspecified organism: Secondary | ICD-10-CM | POA: Diagnosis not present

## 2022-06-26 LAB — CBC WITH DIFFERENTIAL/PLATELET
Abs Immature Granulocytes: 0.4 10*3/uL — ABNORMAL HIGH (ref 0.00–0.07)
Band Neutrophils: 17 %
Basophils Absolute: 0 10*3/uL (ref 0.0–0.1)
Basophils Relative: 0 %
Eosinophils Absolute: 0.1 10*3/uL (ref 0.0–0.5)
Eosinophils Relative: 1 %
HCT: 27.5 % — ABNORMAL LOW (ref 36.0–46.0)
Hemoglobin: 8.3 g/dL — ABNORMAL LOW (ref 12.0–15.0)
Lymphocytes Relative: 11 %
Lymphs Abs: 1 10*3/uL (ref 0.7–4.0)
MCH: 31.4 pg (ref 26.0–34.0)
MCHC: 30.2 g/dL (ref 30.0–36.0)
MCV: 104.2 fL — ABNORMAL HIGH (ref 80.0–100.0)
Metamyelocytes Relative: 4 %
Monocytes Absolute: 0 10*3/uL — ABNORMAL LOW (ref 0.1–1.0)
Monocytes Relative: 0 %
Myelocytes: 1 %
Neutro Abs: 7.2 10*3/uL (ref 1.7–7.7)
Neutrophils Relative %: 66 %
Platelets: 14 10*3/uL — CL (ref 150–400)
RBC: 2.64 MIL/uL — ABNORMAL LOW (ref 3.87–5.11)
RDW: 17 % — ABNORMAL HIGH (ref 11.5–15.5)
WBC: 8.7 10*3/uL (ref 4.0–10.5)
nRBC: 16.3 % — ABNORMAL HIGH (ref 0.0–0.2)
nRBC: 20 /100 WBC — ABNORMAL HIGH

## 2022-06-26 LAB — COMPREHENSIVE METABOLIC PANEL
ALT: 59 U/L — ABNORMAL HIGH (ref 0–44)
AST: 32 U/L (ref 15–41)
Albumin: <1.5 g/dL — ABNORMAL LOW (ref 3.5–5.0)
Alkaline Phosphatase: 162 U/L — ABNORMAL HIGH (ref 38–126)
Anion gap: 10 (ref 5–15)
BUN: 72 mg/dL — ABNORMAL HIGH (ref 8–23)
CO2: 24 mmol/L (ref 22–32)
Calcium: 8.9 mg/dL (ref 8.9–10.3)
Chloride: 109 mmol/L (ref 98–111)
Creatinine, Ser: 1.32 mg/dL — ABNORMAL HIGH (ref 0.44–1.00)
GFR, Estimated: 42 mL/min — ABNORMAL LOW (ref >60)
Glucose, Bld: 277 mg/dL — ABNORMAL HIGH (ref 70–99)
Potassium: 4.5 mmol/L (ref 3.5–5.1)
Sodium: 143 mmol/L (ref 135–145)
Total Bilirubin: 0.8 mg/dL (ref 0.3–1.2)
Total Protein: 4.2 g/dL — ABNORMAL LOW (ref 6.5–8.1)

## 2022-06-26 LAB — CULTURE, BLOOD (ROUTINE X 2)

## 2022-06-26 LAB — ECHOCARDIOGRAM COMPLETE: Height: 65 in

## 2022-06-26 LAB — GLUCOSE, CAPILLARY
Glucose-Capillary: 271 mg/dL — ABNORMAL HIGH (ref 70–99)
Glucose-Capillary: 329 mg/dL — ABNORMAL HIGH (ref 70–99)
Glucose-Capillary: 277 mg/dL — ABNORMAL HIGH (ref 70–99)

## 2022-06-26 MED ORDER — FENTANYL BOLUS VIA INFUSION
25.0000 ug | INTRAVENOUS | Status: DC | PRN
  Administered 2022-06-26 (×3): 100 ug via INTRAVENOUS

## 2022-06-26 MED ORDER — GLYCOPYRROLATE 1 MG PO TABS: 1.0000 mg | ORAL_TABLET | ORAL | Status: DC | PRN

## 2022-06-26 MED ORDER — GLYCOPYRROLATE 0.2 MG/ML IJ SOLN: 0.2000 mg | INTRAMUSCULAR | Status: DC | PRN

## 2022-06-26 MED ORDER — MIDAZOLAM HCL 2 MG/2ML IJ SOLN
2.0000 mg | INTRAMUSCULAR | Status: DC | PRN
  Administered 2022-06-26: 2 mg via INTRAVENOUS
  Filled 2022-06-26: qty 2

## 2022-06-26 MED ORDER — ACETAMINOPHEN 325 MG PO TABS: 650.0000 mg | ORAL_TABLET | Freq: Four times a day (QID) | ORAL | Status: DC | PRN

## 2022-06-26 MED ORDER — GLYCOPYRROLATE 0.2 MG/ML IJ SOLN
0.2000 mg | INTRAMUSCULAR | Status: DC | PRN
  Administered 2022-06-26: 0.2 mg via INTRAVENOUS
  Filled 2022-06-26: qty 1

## 2022-06-26 MED ORDER — MORPHINE SULFATE (PF) 2 MG/ML IV SOLN
2.0000 mg | INTRAVENOUS | Status: DC | PRN
  Administered 2022-06-26 (×2): 4 mg via INTRAVENOUS
  Filled 2022-06-26 (×2): qty 2

## 2022-06-26 MED ORDER — ACETAMINOPHEN 650 MG RE SUPP: 650.0000 mg | Freq: Four times a day (QID) | RECTAL | Status: DC | PRN

## 2022-06-27 LAB — CULTURE, BLOOD (ROUTINE X 2)
Culture: NO GROWTH
Special Requests: ADEQUATE

## 2022-06-29 LAB — CULTURE, BLOOD (ROUTINE X 2)
Culture: NO GROWTH
Culture: NO GROWTH
Special Requests: ADEQUATE
Special Requests: ADEQUATE

## 2022-07-01 DIAGNOSIS — R739 Hyperglycemia, unspecified: Secondary | ICD-10-CM

## 2022-07-01 DIAGNOSIS — E875 Hyperkalemia: Secondary | ICD-10-CM

## 2022-07-12 NOTE — Progress Notes (Signed)
   07-Jul-2022 1853  Spiritual Encounters  Type of Visit Follow up  Care provided to: [facility] partners present during encounter Nurse  Referral source Nurse (RN/NT/LPN)  Reason for visit End-of-life  OnCall Visit Yes  Spiritual Framework  Community/Connection Family   Received page to go to the room as patient has transitioned and family request chaplain presence. Offered comfort to family. Explained what their next step was. Family wants funeral to be held in [place] and will have cremated. Prayed again with son and daughter. Listened to their concerns and offer grief counseling gave them a reference.

## 2022-07-12 NOTE — Progress Notes (Signed)
This RN wasted 125 mL of Fentanyl with Juline Patch, RN

## 2022-07-12 NOTE — Progress Notes (Signed)
NAME:  Charlene Houston, MRN:  [id number], DOB:  1945/09/29, LOS: 4 ADMISSION DATE:  07/10/2022, CONSULTATION DATE:  07-18-2022 REFERRING MD:  EDP  CHIEF COMPLAINT:  shortness of breath   History of Present Illness:  Charlene Houston is a 77 y.o. 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  She developed pneumonitis and treatment was discontinued.  She subsequently had a decline in baseline functional status and has been residing in nursing facility with recent admission for  Klebsiella UTI.  She presented to the ED on 3/12 with respiratory distress and altered mental status.  She was intubated on arrival to the ED, work-up significant for RLL pneumonia on CT and lactic acid of 5.9, K 6.6, creatinine 1.3, ALT 99, AST 61, WBC 6.8, troponin 205, pH 7.5, pO2 505.  She was treated with IVF and broad spectrum antibiotics and required Levophed for blood pressure support. PCCM consulted for admission.   She has a known sacral decubitus ulcer, no gas tracking or abscess on CT.  Pertinent  Medical History   has a past medical history of Arthritis, Asthma, Breast cancer ([place]), Diabetes mellitus without complication ([place]), Hypertension, and SBO (small bowel obstruction) ([place]) (06/2018).   Significant Hospital Events: Including procedures, antibiotic start and stop dates in addition to other pertinent events   3/12 admit to PCCM with septic shock on levophed  3/15 Vented, sedated, slight increase in pressure requirements, GOC with palliative team  2022-07-18 Plan for one way extubation, may need comfort measures   Interim History / Subjective:  No significant events over night Plan for one way extubation    Objective   Blood pressure 112/61, pulse 99, temperature 98.6 F (37 C), resp. rate 14, height 5\' 5"  (1.651 m), weight 88.6 kg, SpO2 100 %.    Vent  Mode: PRVC FiO2 (%):  [40 %] 40 % Set Rate:  [15 bmp-25 bmp] 15 bmp Vt Set:  [420 mL-450 mL] 420 mL PEEP:  [5 cmH20] 5 cmH20 Pressure Support:  [10 cmH20] 10 cmH20 Plateau Pressure:  [21 cmH20-27 cmH20] 25 cmH20   Intake/Output Summary (Last 24 hours) at 18-Jul-2022 [id number] Last data filed at 07/18/22 0600 Gross per 24 hour  Intake 1978.16 ml  Output 980 ml  Net 998.16 ml    Filed Weights   06/24/22 0500 06/25/22 0438 07/18/2022 0500  Weight: 87.9 kg 88.6 kg 88.6 kg    General:  chronically ill female lying in ICU bed  HEENT: MM pink moist, ETT tube/OG tube  Neuro: PERRLA intact but sluggish, triggering vent still, minimal response to pain, not following commands  CV: s1s2, RRR, no m/r/g  PULM: CTA, no respiratory distress  GI/GU: soft, BS active and OG tube, foley catheter in place Extremities: warm/dry, 2-3+ pitting edema in all extremities Skin: no skin rashes/lesions present/ extensive sacral decub    Resolved Hospital Problem list   hyperkalemia   Assessment & Plan:   Acute Hypoxic Respiratory Failure Secondary to R-sided pneumonia  Metapneumo virus + Patient vented ABG 3/13 7.448, Pco2 32.8, Po2 149, Bicarb 22.7  P: Continue to Maintain full vent support with SAT/SBT as tolerated Continue to Titrate Vent setting to maintain SpO2 greater than or equal to 90%. Continue HOB elevated 30 degrees. Continue Bronchial hygiene and RT/bronchodilator protocol. Continue vancomycin and cefepime Will one way extubate patient  today, once family has arrived and is ready, if patient declines will start comfort care per palliative care team   Septic Shock  High suspicion due to right sided pna, decubitus ulcer  Urine strep ag is negative Urine culture growing multiple species-contaminant  Blood cultures drawn on admission are growing Enterococcus faecalis MRSE and Proteus from 1 of 2 sites.  Repeat blood cultures still pending  Vasopressin turned off 28-Jun-2022  P:  ID continue to  follow  Continue vancomycin and cefepime per ID recommendation.  Continue to levophed at 54mcg, do not increase per palliative GOC Continue to follow repeat BCs  Chronic HFrEF with LBBB Elevated troponin Troponin 206 EKG with LBBB though not new Lasix given yesterday  1/12 24 Last ECHO EF 30 to 35%. The left ventricle has  moderately decreased function.  P: Continue to hold diuretics Continue levophed gtt as above   Type 2 DM Hyperglycemia  BS >300s  On tube feeds P:  Continue to provide SSI Continue to provide 5 units of Tube feed coverage  Continue to semeglee 20 units    AKI secondary to Septic Shock  Baseline creatinine .8-.9, 1.3 upon admission 06/28/22 1.32 from Cr 1.27  P:  Continue to follow BMP and UOP, avoid nephrotoxins Adequate perfusion   Elevated transaminases  Probably secondary to Septic Shock  cholelithiasis noted on CT 3/13 AST 233, ALT 163  Trending down 06-28-2022 AST 32, ALT 59 Total bili 0.8  P:  Continue to trend Hepatic Function panel/Total bili   Thrombocytopenia secondary to septic shock  2022/06/28 14 trending down  No signs of bleeding  06/28/22 Hgb 9.1 from 8.2  P: Continue to monitor for signs of bleeding Trend CBCs   Decubitus Ulcer, POA Extensive Decubitus ulcer  P: Wound Care Management following Would need surgical debridement if remained aggressive, per palliative GOC addressed on 2022/06/28 One way extubation, and high possibility of needing to transition to comfort measures   Severe Protein Malnutrition  On Tube Feeds, total protein 1.9  P: Continue Tube feeds     Best Practice (right click and "Reselect all SmartList Selections" daily)   Diet/type: tubefeeds DVT prophylaxis: prophylactic heparin  GI prophylaxis: PPI Lines: N/A Foley:  Yes, and it is still needed Code Status:  DNR Last date of multidisciplinary goals of care discussion  [ Palliative care team and CCM updated daughter on 3/15. Will update daughter today.   Labs    CBC: Recent Labs  Lab 07/01/2022 1005 07/06/2022 1044 06/23/22 0206 06/23/22 0504 06/24/22 1154 06/25/22 0834 June 28, 2022 0056  WBC 6.8  --  7.6  --  7.6 8.8 8.7  NEUTROABS 5.4  --   --   --  6.6 7.1 7.2  HGB 11.5*   < > 11.4* 8.2* 9.1* 8.3* 8.3*  HCT 36.7   < > 36.5 24.0* 29.1* 26.2* 27.5*  MCV 101.9*  --  100.0  --  97.3 98.5 104.2*  PLT PLATELET CLUMPS NOTED ON SMEAR, COUNT APPEARS DECREASED  --  32*  --  16* 14* 14*   < > = values in this interval not displayed.     Basic Metabolic Panel: Recent Labs  Lab 06/23/22 0206 06/23/22 0504 06/23/22 0622 06/23/22 1025 06/23/22 1438 06/23/22 1905 06/23/22 2340 06/24/22 0723 06/24/22 0724 06/24/22 1545 06/24/22 1836 06/25/22 0631 06/25/22 1757 06-28-22 0056  NA 133* 132*  --   --   --   --  134* 139  --   --   --  138  --  143  K 5.7* 4.5   < > 5.8* 5.4*   < > 3.8 3.9   < > 3.4* 3.6 3.7 3.3* 4.5  CL 98  --   --   --   --   --  100 103  --   --   --  104  --  109  CO2 21*  --   --   --   --   --  23 17*  --   --   --  21*  --  24  GLUCOSE 215*  --   --   --   --   --  277* 331*  --   --   --  381*  --  277*  BUN 55*  --   --   --   --   --  58* 61*  --   --   --  64*  --  72*  CREATININE 1.47*  --   --   --   --   --  1.35* 1.30*  --   --   --  1.27*  --  1.32*  CALCIUM 8.4*  --   --   --   --   --  7.9* 8.2*  --   --   --  8.3*  --  8.9  MG 1.7  --   --  1.7 1.6*  --   --  2.0  --  2.2  --   --   --   --   PHOS 3.5  --   --  3.2 3.1  --   --  2.5  --  2.2*  --   --   --   --    < > = values in this interval not displayed.    GFR: Estimated Creatinine Clearance: 39.8 mL/min (A) (by C-G formula based on SCr of 1.32 mg/dL (H)). Recent Labs  Lab 07/02/2022 1005 07/09/2022 1536 06/23/22 0206 06/24/22 1154 06/25/22 0834 2022-07-13 0056  WBC 6.8  --  7.6 7.6 8.8 8.7  LATICACIDVEN 5.9* 3.6*  --   --   --   --      Liver Function Tests: Recent Labs  Lab 07/03/2022 1005 06/23/22 0206 2022/07/13 0056  AST 99* 233* 32  ALT 61*  163* 59*  ALKPHOS 116 103 162*  BILITOT 1.1 2.0* 0.8  PROT 4.7* 4.8* 4.2*  ALBUMIN <1.5* 1.9* <1.5*    No results for input(s): "LIPASE", "AMYLASE" in the last 168 hours. No results for input(s): "AMMONIA" in the last 168 hours.  ABG    Component Value Date/Time   PHART 7.448 06/23/2022 0504   PCO2ART 32.8 06/23/2022 0504   PO2ART 149 (H) 06/23/2022 0504   HCO3 22.7 06/23/2022 0504   TCO2 24 06/23/2022 0504   ACIDBASEDEF 1.0 06/23/2022 0504   O2SAT 99 06/23/2022 0504     Coagulation Profile: No results for input(s): "INR", "PROTIME" in the last 168 hours.  Cardiac Enzymes: No results for input(s): "CKTOTAL", "CKMB", "CKMBINDEX", "TROPONINI" in the last 168 hours.  HbA1C: Hgb A1c MFr Bld  Date/Time Value Ref Range Status  05/26/2022 06:04 PM 8.3 (H) 4.8 - 5.6 % Final    Comment:    (NOTE) Pre diabetes:          5.7%-6.4%  Diabetes:              >6.4%  Glycemic control for   <7.0% adults with diabetes  01/02/2022 03:46 AM 6.0 (H) 4.8 - 5.6 % Final    Comment:    (NOTE) Pre diabetes:          5.7%-6.4%  Diabetes:              >6.4%  Glycemic control for   <7.0% adults with diabetes     CBG: Recent Labs  Lab 06/25/22 1205 06/25/22 1529 06/25/22 1921 06/25/22 2318 June 28, 2022 0325  GLUCAP 306* 261* 214* 242* 271*     Critical care time: 45 minutes     CRITICAL CARE Performed by: [name] BSN, RN, S-ACNP    Total critical care time: 45 minutes  Critical care time was exclusive of separately billable procedures and treating other patients.  Critical care was necessary to treat or prevent imminent or life-threatening deterioration.  Critical care was time spent personally by me on the following activities: development of treatment plan with patient and/or surrogate as well as nursing, discussions with consultants, evaluation of patient's response to treatment, examination of patient, obtaining history from patient or surrogate, ordering and  performing treatments and interventions, ordering and review of laboratory studies, ordering and review of radiographic studies, pulse oximetry and re-evaluation of patient's condition.

## 2022-07-12 NOTE — Progress Notes (Signed)
   07/16/22 1332  Spiritual Encounters  Type of Visit Follow up  Care provided to: Pt and family  Conversation partners present during encounter Nurse  Reason for visit End-of-life  OnCall Visit Yes  Spiritual Framework  Presenting Themes Values and beliefs;Impactful experiences and emotions;Other (comment)  Community/Connection Family  Patient Stress Factors None identified  Family Stress Factors Exhausted;Loss;Major life changes;Family relationships;Loss of control  Spiritual Care Plan  Spiritual Care Issues Still Outstanding Chaplain will continue to follow   Received call to return to room to comfort daughter as they have decided to extubate. Stated with patient's children during the process. Called long distant relative per daughter. Remain in room for approximately 30 minutes with family, patient had not yet transitioned, however received another call and page from trauma. Explained to family that they can request my presence again when patient transitions if they like.  Son and daughter only local relative of patient, not close family ties.

## 2022-07-12 NOTE — Progress Notes (Signed)
   July 21, 2022 1012  Spiritual Encounters  Type of Visit Initial  Care provided to: [facility] partners present during encounter Nurse  Reason for visit End-of-life  OnCall Visit Yes  Spiritual Framework  Presenting Themes Meaning/purpose/sources of inspiration;Impactful experiences and emotions  Community/Connection Family  Family Stress Factors None identified;Major life changes;Exhausted  Interventions  Spiritual Care Interventions Made Compassionate presence;Reflective listening;Decision-making support/facilitation;Prayer  Intervention Outcomes  Outcomes Connection to spiritual care;Patient family open to [place] will continue to follow   Responded to page for patient's family members request. Visited with daughter of patient and listened as she reflected on her mother's life and their relationship. Brother came in time for prayer. Had a reflective conversation with him after mother's condition determined to be declining.

## 2022-07-12 NOTE — Progress Notes (Signed)
Attempted Echocardiogram, the patients nurse Ubaldo Glassing informed me that the exam has been canceled.

## 2022-07-12 NOTE — Progress Notes (Signed)
Patient's belonging returned to family. Patient's body has been brought down to the morgue.

## 2022-07-12 NOTE — Death Summary Note (Signed)
DEATH SUMMARY   Patient Details  Name: Charlene Houston MRN: [id number] DOB: [birth year]  Admission/Discharge Information   Admit Date:  22-Jul-2022  Date of Death: Date of Death: July 26, 2022  Time of Death: Time of Death: 1808/08/11  Length of Stay: 4  Referring Physician: Lilian Coma., MD   Reason(s) for Hospitalization  Acute hypoxic respiratory failure Severe sepsis with septic shock due to right lower lobe pneumonia, POA Metapneumovirus respiratory tract infection Decubitus ulcer infection, on sacrum, POA Acute septic encephalopathy Diabetes type 2 Acute kidney injury Demand cardiac ischemia Shock liver Acute metabolic acidosis Hyperkalemia, improved Chronic HFrEF with L BBB Severe protein calorie malnutrition  Diagnoses  Preliminary cause of death: Withdrawal of care in the setting of multisystem organ failure Secondary Diagnoses (including complications and co-morbidities):  Principal Problem:   Respiratory failure ([place]) Active Problems:   Encephalopathy acute   Pneumonia of right lower lobe due to infectious organism   Septic shock ([place])   Bacteremia   Bronchopneumonia due to human metapneumovirus (hMPV)   Malnutrition of moderate degree   Brief Hospital Course (including significant findings, care, treatment, and services provided and events leading to death)  Charlene Houston is a 77 y.o. 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  She developed pneumonitis and treatment was discontinued.  She subsequently had a decline in baseline functional status and has been residing in nursing facility with recent admission for  Klebsiella UTI.  She presented to the ED on 22-Jul-2022 with respiratory distress and altered mental status.   She was intubated on arrival to the ED, work-up significant for RLL  pneumonia on CT and lactic acid of 5.9, K 6.6, creatinine 1.3, ALT 99, AST 61, WBC 6.8, troponin 205, pH 7.5, pO2 505.  She was treated with IVF and broad spectrum antibiotics and required Levophed for blood pressure support. PCCM consulted for admission.   She has a known sacral decubitus ulcer, no gas tracking or abscess on CT  Patient was admitted to ICU, was started on broad-spectrum antibiotics, she remained on mechanical ventilatory support, CT abdomen and pelvis showed deep sacral decubitus wound, wound care was consulted, they recommend general surgery consult.  Patient developed multisystem organ failure including AKI, respiratory failure and encephalopathy in the setting of sepsis.  Goals of care discussions were carried with family, initially patient was made DNR with continue current care but as she was not improving, patient's family decided to proceed with comfort care and palliative extubation considering patient has untreated lung cancer which is not curable at this point.  Patient was palliatively extubated and she passed on 07-26-22 at 6:10 PM.  Patient's family was at bedside  Pertinent Labs and Studies  Significant Diagnostic Studies CT CHEST ABDOMEN PELVIS WO CONTRAST  Result Date: Jul 22, 2022 CLINICAL DATA:  Sepsis, mental status change EXAM: CT CHEST, ABDOMEN AND PELVIS WITHOUT CONTRAST TECHNIQUE: Multidetector CT imaging of the chest, abdomen and pelvis was performed following the standard protocol without IV contrast. RADIATION DOSE REDUCTION: This exam was performed according to the departmental dose-optimization program which includes automated exposure control, adjustment of the mA and/or kV according to patient size and/or use of iterative reconstruction technique. COMPARISON:  01/01/2022 and previous FINDINGS: CT CHEST FINDINGS Cardiovascular: Right IJ port catheter extends to the cavoatrial junction. Heart size normal. No pericardial  effusion. Coronary calcifications. Scattered  aortic atheromatous calcifications without aneurysm. Mediastinum/Nodes: No mass or adenopathy. Endotracheal tube terminates 4.1 cm above carina. Nasogastric tube extends to decompressed stomach. Lungs/Pleura: No pleural effusion. No pneumothorax. 3.3 cm masslike consolidation in the medial basal segment right lower lobe, new since previous. Multiple pulmonary nodules in all lobes as before, largest 1.2 cm in the anterior right lower lobe (Im96,Se5) previously 1.4 cm ; on the left 1.1 cm in the lingula image 77 previously 0.9 cm. Poorly marginated airspace process in the posteromedial left lower lobe image 91 approximately 1.9 cm, new since previous. Musculoskeletal: Vertebral endplate spurring at multiple levels in the mid thoracic spine. CT ABDOMEN PELVIS FINDINGS Hepatobiliary: Innumerable hyperdense subcentimeter gallstones in the dependent aspect of the nondilated gallbladder. No discrete liver lesion or biliary ductal dilatation. Pancreas: Mild background parenchymal atrophy without ductal dilatation or regional inflammatory change. Poorly marginated 1.4 cm masslike area in the midbody with adjacent coarse calcification. 2.7 cm mass in the pancreatic tail, with adjacent retroperitoneal satellite nodules up to 1.7 cm. Spleen: Normal in size without focal abnormality. Adrenals/Urinary Tract: No adrenal mass. 4 mm calculus in the upper pole right renal collecting system. No hydronephrosis. 1.6 cm right upper pole probable renal cyst, stable since 07/06/2018; no follow-up recommended. Urinary bladder partially decompressed by Foley catheter. Stomach/Bowel: Nasogastric tube decompresses the stomach. The small bowel is nondistended. Normal appendix. Colon is partially distended by gas and fecal material, with scattered diverticula; no adjacent inflammatory change. Vascular/Lymphatic: Scattered aortoiliac calcified atheromatous plaque without aneurysm. No abdominal or pelvic adenopathy. Reproductive: Status post  hysterectomy. No adnexal masses. Other: No ascites.  No free air. Musculoskeletal: Small paraumbilical hernia containing only mesenteric fat. Subcentimeter bone islands in the right ilium and proximal left femur stable since 07/06/2018. Bilateral sacroiliitis. Mild spondylitic changes in the lumbar spine. No acute findings. IMPRESSION: 1. Airspace consolidation in the right lower lobe, possibly pneumonia. 2. Multiple pulmonary nodules in all lobes, consistent with metastatic disease, without convincing progression since previous exam. 3. Pancreatic body and tail masses with adjacent retroperitoneal satellite nodules, new since prior study. 4. Cholelithiasis. 5. Right nephrolithiasis without hydronephrosis. 6. Colonic diverticulosis. 7. Small paraumbilical hernia containing only mesenteric fat. 8.  Aortic Atherosclerosis (ICD10-I70.0). Electronically Signed   By: Lucrezia Europe M.D.   On: 07/09/2022 12:29   CT Head Wo Contrast  Result Date: 06/30/2022 CLINICAL DATA:  Mental status change of unknown cause. EXAM: CT HEAD WITHOUT CONTRAST TECHNIQUE: Contiguous axial images were obtained from the base of the skull through the vertex without intravenous contrast. RADIATION DOSE REDUCTION: This exam was performed according to the departmental dose-optimization program which includes automated exposure control, adjustment of the mA and/or kV according to patient size and/or use of iterative reconstruction technique. COMPARISON:  MRI 05/27/2022.  CT 05/26/2022. FINDINGS: Brain: Age related volume loss. No sign of acute infiltrate, mass, hemorrhage, hydrocephalus or extra-axial collection. Vascular: There is atherosclerotic calcification of the major vessels at the base of the brain. Skull: Negative Sinuses/Orbits: Paranasal sinuses are clear.  Orbits appear normal. Other: None IMPRESSION: No acute CT finding. Age related volume loss. Atherosclerotic calcification of the major vessels at the base of the brain. Electronically  Signed   By: Nelson Chimes M.D.   On: 06/20/2022 12:13   DG Chest Portable 1 View  Result Date: 07/06/2022 CLINICAL DATA:  Intubation EXAM: PORTABLE CHEST 1 VIEW COMPARISON:  05/26/2022 FINDINGS: Cardiomegaly. Right chest port catheter. Interval placement of endotracheal and esophagogastric tubes, appropriately positioned tip  over the midtrachea and tip and side port below the diaphragm. No new or acute appearing airspace opacity. Osseous structures unremarkable. IMPRESSION: 1. Interval placement of endotracheal and esophagogastric tubes, appropriately positioned tip over the midtrachea and tip and side port below the diaphragm. 2. No new or acute appearing airspace opacity. Electronically Signed   By: Delanna Ahmadi M.D.   On: 07/05/2022 10:28    Microbiology Recent Results (from the past 240 hour(s))  Blood Culture (routine x 2)     Status: Abnormal   Collection Time: 06/28/2022 10:05 AM   Specimen: BLOOD  Result Value Ref Range Status   Specimen Description BLOOD LEFT ANTECUBITAL  Final   Special Requests   Final    BOTTLES DRAWN AEROBIC AND ANAEROBIC Blood Culture results may not be optimal due to an inadequate volume of blood received in culture bottles   Culture  Setup Time   Final    GRAM NEGATIVE RODS GRAM POSITIVE COCCI IN BOTH AEROBIC AND ANAEROBIC BOTTLES CRITICAL RESULT CALLED TO, READ BACK BY AND VERIFIED WITH: PHARMD G. ABBOTT 06/23/22 @ 0210 BY AB    Culture (A)  Final    PROTEUS MIRABILIS ENTEROCOCCUS FAECALIS CITROBACTER BRAAKII STAPHYLOCOCCUS HAEMOLYTICUS THE SIGNIFICANCE OF ISOLATING THIS ORGANISM FROM A SINGLE SET OF BLOOD CULTURES WHEN MULTIPLE SETS ARE DRAWN IS UNCERTAIN. PLEASE NOTIFY THE MICROBIOLOGY DEPARTMENT WITHIN ONE WEEK IF SPECIATION AND SENSITIVITIES ARE REQUIRED. Performed at [facility], [place] [address] [place], [place] [address]    Report Status 06-27-2022 FINAL  Final   Organism ID, Bacteria PROTEUS MIRABILIS  Final   Organism ID, Bacteria  ENTEROCOCCUS FAECALIS  Final   Organism ID, Bacteria CITROBACTER BRAAKII  Final      Susceptibility   Citrobacter braakii - MIC*    CEFEPIME <=0.12 SENSITIVE Sensitive     CEFTAZIDIME <=1 SENSITIVE Sensitive     CEFTRIAXONE <=0.25 SENSITIVE Sensitive     CIPROFLOXACIN <=0.25 SENSITIVE Sensitive     GENTAMICIN <=1 SENSITIVE Sensitive     IMIPENEM 0.5 SENSITIVE Sensitive     TRIMETH/SULFA <=20 SENSITIVE Sensitive     PIP/TAZO <=4 SENSITIVE Sensitive     * CITROBACTER BRAAKII   Enterococcus faecalis - MIC*    AMPICILLIN <=2 SENSITIVE Sensitive     VANCOMYCIN 1 SENSITIVE Sensitive     GENTAMICIN SYNERGY SENSITIVE Sensitive     LINEZOLID 2 SENSITIVE Sensitive     * ENTEROCOCCUS FAECALIS   Proteus mirabilis - MIC*    AMPICILLIN <=2 SENSITIVE Sensitive     CEFEPIME <=0.12 SENSITIVE Sensitive     CEFTAZIDIME <=1 SENSITIVE Sensitive     CEFTRIAXONE <=0.25 SENSITIVE Sensitive     CIPROFLOXACIN <=0.25 SENSITIVE Sensitive     GENTAMICIN <=1 SENSITIVE Sensitive     IMIPENEM 1 SENSITIVE Sensitive     TRIMETH/SULFA <=20 SENSITIVE Sensitive     AMPICILLIN/SULBACTAM <=2 SENSITIVE Sensitive     PIP/TAZO <=4 SENSITIVE Sensitive     * PROTEUS MIRABILIS  Blood Culture ID Panel (Reflexed)     Status: Abnormal   Collection Time: 07/04/2022 10:05 AM  Result Value Ref Range Status   Enterococcus faecalis DETECTED (A) NOT DETECTED Final    Comment: CRITICAL RESULT CALLED TO, READ BACK BY AND VERIFIED WITH: PHARMD G. ABBOTT 06/23/22 @ 0210 BY AB    Enterococcus Faecium NOT DETECTED NOT DETECTED Final   Listeria monocytogenes NOT DETECTED NOT DETECTED Final   Staphylococcus species DETECTED (A) NOT DETECTED Final    Comment: CRITICAL RESULT  CALLED TO, READ BACK BY AND VERIFIED WITH: PHARMD G. ABBOTT 06/23/22 @ 0210 BY AB    Staphylococcus aureus (BCID) NOT DETECTED NOT DETECTED Final   Staphylococcus epidermidis DETECTED (A) NOT DETECTED Final    Comment: Methicillin (oxacillin) resistant coagulase  negative staphylococcus. Possible blood culture contaminant (unless isolated from more than one blood culture draw or clinical case suggests pathogenicity). No antibiotic treatment is indicated for blood  culture contaminants. CRITICAL RESULT CALLED TO, READ BACK BY AND VERIFIED WITH: PHARMD G. ABBOTT 06/23/22 @ 0210 BY AB    Staphylococcus lugdunensis NOT DETECTED NOT DETECTED Final   Streptococcus species NOT DETECTED NOT DETECTED Final   Streptococcus agalactiae NOT DETECTED NOT DETECTED Final   Streptococcus pneumoniae NOT DETECTED NOT DETECTED Final   Streptococcus pyogenes NOT DETECTED NOT DETECTED Final   A.calcoaceticus-baumannii NOT DETECTED NOT DETECTED Final   Bacteroides fragilis NOT DETECTED NOT DETECTED Final   Enterobacterales DETECTED (A) NOT DETECTED Final    Comment: Enterobacterales represent a large order of gram negative bacteria, not a single organism. CRITICAL RESULT CALLED TO, READ BACK BY AND VERIFIED WITH: PHARMD G. ABBOTT 06/23/22 @ 0210 BY AB    Enterobacter cloacae complex NOT DETECTED NOT DETECTED Final   Escherichia coli NOT DETECTED NOT DETECTED Final   Klebsiella aerogenes NOT DETECTED NOT DETECTED Final   Klebsiella oxytoca NOT DETECTED NOT DETECTED Final   Klebsiella pneumoniae NOT DETECTED NOT DETECTED Final   Proteus species DETECTED (A) NOT DETECTED Final    Comment: CRITICAL RESULT CALLED TO, READ BACK BY AND VERIFIED WITH: PHARMD G. ABBOTT 06/23/22 @ 0210 BY AB    Salmonella species NOT DETECTED NOT DETECTED Final   Serratia marcescens NOT DETECTED NOT DETECTED Final   Haemophilus influenzae NOT DETECTED NOT DETECTED Final   Neisseria meningitidis NOT DETECTED NOT DETECTED Final   Pseudomonas aeruginosa NOT DETECTED NOT DETECTED Final   Stenotrophomonas maltophilia NOT DETECTED NOT DETECTED Final   Candida albicans NOT DETECTED NOT DETECTED Final   Candida auris NOT DETECTED NOT DETECTED Final   Candida glabrata NOT DETECTED NOT DETECTED Final    Candida krusei NOT DETECTED NOT DETECTED Final   Candida parapsilosis NOT DETECTED NOT DETECTED Final   Candida tropicalis NOT DETECTED NOT DETECTED Final   Cryptococcus neoformans/gattii NOT DETECTED NOT DETECTED Final   CTX-M ESBL NOT DETECTED NOT DETECTED Final   Carbapenem resistance IMP NOT DETECTED NOT DETECTED Final   Carbapenem resistance KPC NOT DETECTED NOT DETECTED Final   Methicillin resistance mecA/C DETECTED (A) NOT DETECTED Final    Comment: CRITICAL RESULT CALLED TO, READ BACK BY AND VERIFIED WITH: PHARMD G. ABBOTT 06/23/22 @ 0210 BY AB    Carbapenem resistance NDM NOT DETECTED NOT DETECTED Final   Carbapenem resist OXA 48 LIKE NOT DETECTED NOT DETECTED Final   Vancomycin resistance NOT DETECTED NOT DETECTED Final   Carbapenem resistance VIM NOT DETECTED NOT DETECTED Final    Comment: Performed at [facility], [address] [place], [place] [address]  Resp panel by RT-PCR (RSV, Flu A&B, Covid) Anterior Nasal Swab     Status: None   Collection Time: 07/07/2022 10:07 AM   Specimen: Anterior Nasal Swab  Result Value Ref Range Status   SARS Coronavirus 2 by RT PCR NEGATIVE NEGATIVE Final   Influenza A by PCR NEGATIVE NEGATIVE Final   Influenza B by PCR NEGATIVE NEGATIVE Final    Comment: (NOTE) The Xpert Xpress SARS-CoV-2/FLU/RSV plus assay is intended as an aid in the  diagnosis of influenza from Nasopharyngeal swab specimens and should not be used as a sole basis for treatment. Nasal washings and aspirates are unacceptable for Xpert Xpress SARS-CoV-2/FLU/RSV testing.  Fact Sheet for Patients: EntrepreneurPulse.com.au  Fact Sheet for Healthcare Providers: IncredibleEmployment.be  This test is not yet approved or cleared by the [place] FDA and has been authorized for detection and/or diagnosis of SARS-CoV-2 by FDA under an Emergency Use Authorization (EUA). This EUA will remain in effect (meaning this test can be used)  for the duration of the COVID-19 declaration under Section 564(b)(1) of the Act, 21 U.S.C. section 360bbb-3(b)(1), unless the authorization is terminated or revoked.     Resp Syncytial Virus by PCR NEGATIVE NEGATIVE Final    Comment: (NOTE) Fact Sheet for Patients: EntrepreneurPulse.com.au  Fact Sheet for Healthcare Providers: IncredibleEmployment.be  This test is not yet approved or cleared by the [place] FDA and has been authorized for detection and/or diagnosis of SARS-CoV-2 by FDA under an Emergency Use Authorization (EUA). This EUA will remain in effect (meaning this test can be used) for the duration of the COVID-19 declaration under Section 564(b)(1) of the Act, 21 U.S.C. section 360bbb-3(b)(1), unless the authorization is terminated or revoked.  Performed at [facility], [place] [address], [place], [place] [address]   Respiratory (~20 pathogens) panel by PCR     Status: Abnormal   Collection Time: 06/29/2022  2:15 PM   Specimen: Nasopharyngeal Swab; Respiratory  Result Value Ref Range Status   Adenovirus NOT DETECTED NOT DETECTED Final   Coronavirus 229E NOT DETECTED NOT DETECTED Final    Comment: (NOTE) The Coronavirus on the Respiratory Panel, DOES NOT test for the novel  Coronavirus (2019 nCoV)    Coronavirus HKU1 NOT DETECTED NOT DETECTED Final   Coronavirus NL63 NOT DETECTED NOT DETECTED Final   Coronavirus OC43 NOT DETECTED NOT DETECTED Final   Metapneumovirus DETECTED (A) NOT DETECTED Final   Rhinovirus / Enterovirus NOT DETECTED NOT DETECTED Final   Influenza A NOT DETECTED NOT DETECTED Final   Influenza B NOT DETECTED NOT DETECTED Final   Parainfluenza Virus 1 NOT DETECTED NOT DETECTED Final   Parainfluenza Virus 2 NOT DETECTED NOT DETECTED Final   Parainfluenza Virus 3 NOT DETECTED NOT DETECTED Final   Parainfluenza Virus 4 NOT DETECTED NOT DETECTED Final   Respiratory Syncytial Virus NOT DETECTED NOT DETECTED  Final   Bordetella pertussis NOT DETECTED NOT DETECTED Final   Bordetella Parapertussis NOT DETECTED NOT DETECTED Final   Chlamydophila pneumoniae NOT DETECTED NOT DETECTED Final   Mycoplasma pneumoniae NOT DETECTED NOT DETECTED Final    Comment: Performed at [facility], [place]. [address] [place], [place] [address]  MRSA Next Gen by PCR, Nasal     Status: None   Collection Time: 06/13/2022  2:15 PM   Specimen: Nasal Mucosa; Nasal Swab  Result Value Ref Range Status   MRSA by PCR Next Gen NOT DETECTED NOT DETECTED Final    Comment: (NOTE) The GeneXpert MRSA Assay (FDA approved for NASAL specimens only), is one component of a comprehensive MRSA colonization surveillance program. It is not intended to diagnose MRSA infection nor to guide or monitor treatment for MRSA infections. Test performance is not FDA approved in patients less than 36 years old. Performed at [facility], [place] [address] [place], [place] [address]   Remove and replace urinary cath (placed > 5 days) then obtain urine culture from new indwelling urinary catheter.  Status: Abnormal   Collection Time: 06/13/2022  2:20 PM   Specimen: Urine, Catheterized  Result Value Ref Range Status   Specimen Description URINE, CATHETERIZED  Final   Special Requests   Final    NONE Performed at [facility], [address] [place], [place] [address]    Culture MULTIPLE SPECIES PRESENT, SUGGEST RECOLLECTION (A)  Final   Report Status 06/24/2022 FINAL  Final  Blood Culture (routine x 2)     Status: None   Collection Time: 07/03/2022  4:18 PM   Specimen: BLOOD RIGHT ARM  Result Value Ref Range Status   Specimen Description BLOOD RIGHT ARM  Final   Special Requests   Final    BOTTLES DRAWN AEROBIC ONLY Blood Culture adequate volume   Culture   Final    NO GROWTH 5 DAYS Performed at [facility], [place] [address] [place], [place] [address]    Report Status 06/27/2022 FINAL  Final  Urine Culture     Status:  Abnormal   Collection Time: 06/20/2022  6:50 PM   Specimen: Urine, Catheterized  Result Value Ref Range Status   Specimen Description URINE, CATHETERIZED  Final   Special Requests   Final    NONE Reflexed from [id number] Performed at [facility], [place] [address], [place], [place] [address]    Culture MULTIPLE SPECIES PRESENT, SUGGEST RECOLLECTION (A)  Final   Report Status 06/23/2022 FINAL  Final  Culture, blood (Routine X 2) w Reflex to ID Panel     Status: None (Preliminary result)   Collection Time: 06/24/22  3:45 PM   Specimen: BLOOD LEFT HAND  Result Value Ref Range Status   Specimen Description BLOOD LEFT HAND  Final   Special Requests   Final    BOTTLES DRAWN AEROBIC AND ANAEROBIC Blood Culture adequate volume   Culture   Final    NO GROWTH 3 DAYS Performed at [facility], [place] [address], [place], [place] [address]    Report Status PENDING  Incomplete  Culture, blood (Routine X 2) w Reflex to ID Panel     Status: None (Preliminary result)   Collection Time: 06/24/22  3:47 PM   Specimen: BLOOD LEFT HAND  Result Value Ref Range Status   Specimen Description BLOOD LEFT HAND  Final   Special Requests   Final    BOTTLES DRAWN AEROBIC ONLY Blood Culture adequate volume   Culture   Final    NO GROWTH 3 DAYS Performed at [facility], [place] [address], [place], [place] [address]    Report Status PENDING  Incomplete    Lab Basic Metabolic Panel: Recent Labs  Lab 06/23/22 0206 06/23/22 0504 06/23/22 0622 06/23/22 1025 06/23/22 1438 06/23/22 1905 06/23/22 2340 06/24/22 0723 06/24/22 0724 06/24/22 1545 06/24/22 1836 06/25/22 0631 06/25/22 1757 14-Jul-2022 0056  NA 133* 132*  --   --   --   --  134* 139  --   --   --  138  --  143  K 5.7* 4.5   < > 5.8* 5.4*   < > 3.8 3.9   < > 3.4* 3.6 3.7 3.3* 4.5  CL 98  --   --   --   --   --  100 103  --   --   --  104  --  109  CO2 21*  --   --   --   --   --  23 17*  --   --   --  21*  --  24  GLUCOSE 215*  --   --    --   --   --  277* 331*  --   --   --  381*  --  277*  BUN 55*  --   --   --   --   --  58* 61*  --   --   --  64*  --  72*  CREATININE 1.47*  --   --   --   --   --  1.35* 1.30*  --   --   --  1.27*  --  1.32*  CALCIUM 8.4*  --   --   --   --   --  7.9* 8.2*  --   --   --  8.3*  --  8.9  MG 1.7  --   --  1.7 1.6*  --   --  2.0  --  2.2  --   --   --   --   PHOS 3.5  --   --  3.2 3.1  --   --  2.5  --  2.2*  --   --   --   --    < > = values in this interval not displayed.   Liver Function Tests: Recent Labs  Lab 06/20/2022 1005 06/23/22 0206 2022/07/23 0056  AST 99* 233* 32  ALT 61* 163* 59*  ALKPHOS 116 103 162*  BILITOT 1.1 2.0* 0.8  PROT 4.7* 4.8* 4.2*  ALBUMIN <1.5* 1.9* <1.5*   No results for input(s): "LIPASE", "AMYLASE" in the last 168 hours. No results for input(s): "AMMONIA" in the last 168 hours. CBC: Recent Labs  Lab 06/20/2022 1005 06/19/2022 1044 06/23/22 0206 06/23/22 0504 06/24/22 1154 06/25/22 0834 07-23-22 0056  WBC 6.8  --  7.6  --  7.6 8.8 8.7  NEUTROABS 5.4  --   --   --  6.6 7.1 7.2  HGB 11.5*   < > 11.4* 8.2* 9.1* 8.3* 8.3*  HCT 36.7   < > 36.5 24.0* 29.1* 26.2* 27.5*  MCV 101.9*  --  100.0  --  97.3 98.5 104.2*  PLT PLATELET CLUMPS NOTED ON SMEAR, COUNT APPEARS DECREASED  --  32*  --  16* 14* 14*   < > = values in this interval not displayed.   Cardiac Enzymes: No results for input(s): "CKTOTAL", "CKMB", "CKMBINDEX", "TROPONINI" in the last 168 hours. Sepsis Labs: Recent Labs  Lab 06/21/2022 1005 07/02/2022 1536 06/23/22 0206 06/24/22 1154 06/25/22 0834 July 23, 2022 0056  WBC 6.8  --  7.6 7.6 8.8 8.7  LATICACIDVEN 5.9* 3.6*  --   --   --   --     Procedures/Operations     Rustin Erhart 06/27/2022, 11:06 AM

## 2022-07-12 NOTE — Procedures (Signed)
Extubation Procedure Note  Patient Details:   Name: Charlene Houston DOB: [birth year] MRN: [id number]   Airway Documentation:    Vent end date: 07/17/22 Vent end time: 1330   Evaluation  O2 sats: stable throughout Complications: No apparent complications Patient did tolerate procedure well. Bilateral Breath Sounds: Rhonchi, Diminished    Patient extubated per order, comfort measures in place.  [name] 2022/07/17, 1:40 PM

## 2022-07-12 NOTE — Progress Notes (Signed)
   Palliative Medicine Inpatient Follow Up Note HPI: 77 y.o. 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 Sacral wound and also with Metapneumonvirus and possible UTI.   Today's Discussion 06-30-22  *Please note that this is a verbal dictation therefore any spelling or grammatical errors are due to the "[place] [employer]" system interpretation.  Chart reviewed inclusive of vital signs, progress notes, laboratory results, and diagnostic images.   I met this late morning with patient's daughter Davy Pique alongside [name] of the critical care team.  Davy Pique confirmed in conversation the goals of extubation this afternoon after her brother visits.  She was overwhelmed with emotion during our time together.  She vocalized struggling with how to tell her grandmother, Lalaine's mother about her present clinical state.  Tremendous reassurance was provided in the setting of the impending decisions and the reality that Davy Pique is doing everything within her mother's best interest.  We discussed the idea of full comfort care at the time of extubation which Davy Pique is in agreement with. We talked about transition to comfort measures in house and what that would entail inclusive of medications to control pain, dyspnea, agitation, nausea, itching, and hiccups.  We discussed stopping all uneccessary measures such as cardiac monitoring, blood draws, needle sticks, and frequent vital signs. Utilized reflective listening throughout our time together.   Both Vonna Kotyk and I remained present well the patient's mother was called on speaker phone.  Support provided to [facility] given the gravity of the decisions which she was making.  Created space and opportunity for patient to  explore thoughts feelings and fears regarding current medical situation.  Questions and concerns addressed/Palliative Support Provided.   Objective Assessment: Vital Signs Vitals:   06-30-2022 1156 30-Jun-2022 1200  BP: (!) 111/58 (!) 103/56  Pulse: 99 99  Resp: 15 15  Temp:  98.4 F (36.9 C)  SpO2: 100% 100%    Intake/Output Summary (Last 24 hours) at June 30, 2022 1226 Last data filed at June 30, 2022 0900 Gross per 24 hour  Intake 1544.95 ml  Output 625 ml  Net 919.95 ml   Last Weight  Most recent update: 06-30-22  6:04 AM    Weight  88.6 kg (195 lb 5.2 oz)            Gen: Elderly African-American chronically ill-appearing female HEENT: Endo tracheal tube, dry mucous membranes CV: Regular rate and irregular rhythm PULM: On chemical ventilatory support ABD: Distended EXT: Generalized edema Neuro: Somnolent  SUMMARY OF RECOMMENDATIONS   DNAR  Compassionate extubation this afternoon after patient's son visits  Transition to full comfort care -will continue fentanyl drip  Unrestricted visitation  Ongoing palliative care support  Anticipate in-hospital death  Billing based on MDM: High ______________________________________________________________________________________ [place] Team Team Cell Phone: [phone] Please utilize secure chat with additional questions, if there is no response within 30 minutes please call the above phone number  Palliative Medicine Team providers are available by phone from 7am to 7pm daily and can be reached through the team cell phone.  Should this patient require assistance outside of these hours, please call the patient's attending physician.

## 2022-07-12 NOTE — IPAL (Cosign Needed)
  Interdisciplinary Goals of Care Family Meeting   Date carried out: 07-10-22  Location of the meeting: Conference room  Member's involved: Physician and Family Member or next of kin  Durable Power of Attorney or acting medical decision maker: Daughter and Charlene Houston  Discussion: Goals of care for Charlene Houston were discussed with daughter Charlene Houston) with palliative care team. Plan will be to proceed with extubation and comfort measures after patient's son arrives per daughter. Chaplain notified for support of patient and family.   Code status:   Code Status: DNR   Disposition: Extubate to In-patient comfort care  Time spent for the meeting: [name]    [name] BSN, RN, S-ACNP   10-Jul-2022, 11:04 AM

## 2022-07-12 NOTE — Significant Event (Signed)
Patient passed away at 1810. Confirmed death with Tommie Ard, RN at bedside. Ina Homes, MD informed of patient's death. Family at bedside at time of passing.

## 2022-07-12 DEATH — deceased

## 2023-12-31 IMAGING — MR MR HEAD W/O CM
12 of 13 series · 44 of 48 positions shown · non-contrast
Comparison: None.

CLINICAL DATA: Unsteady gait and dizziness

EXAM:
MRI HEAD WITHOUT CONTRAST
TECHNIQUE: Multiplanar, multiecho pulse sequences of the brain and surrounding
structures were obtained without intravenous contrast.

[Series 5: DWI · axial · 3.0mm · 0.92mm/px · z∈[-94,+65]mm · 8 of 108 slices shown (1 of 4)]
[im 1/108]
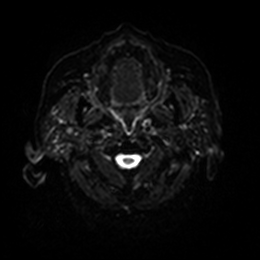
[im 16/108]
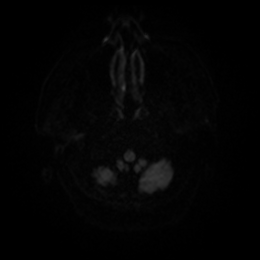
[im 31/108]
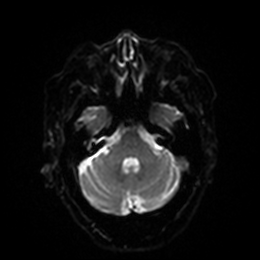
[im 46/108]
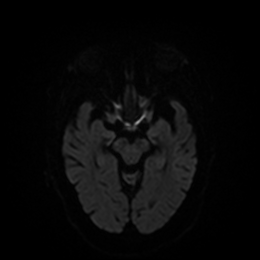
[im 62/108]
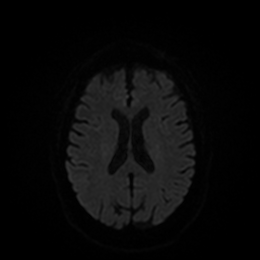
[im 77/108]
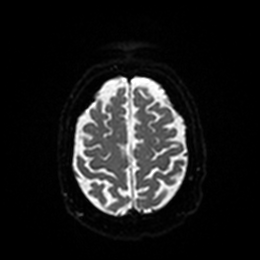
[im 92/108]
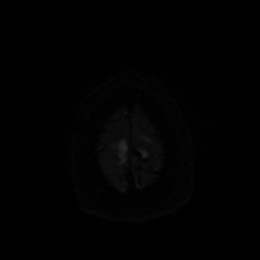
[im 108/108]
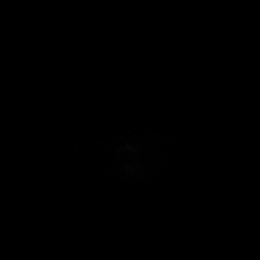

[Series 6: DWI · axial · 3.0mm · 0.92mm/px · z∈[-94,+65]mm · 4 of 54 slices shown (2 of 4)]
[im 1/54]
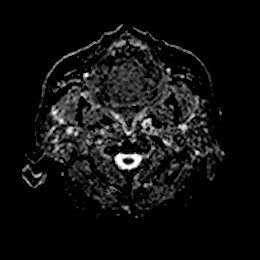
[im 18/54]
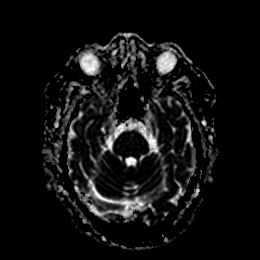
[im 36/54]
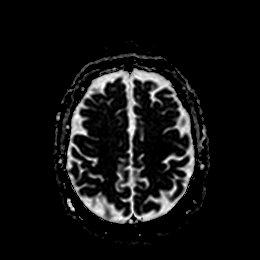
[im 54/54]
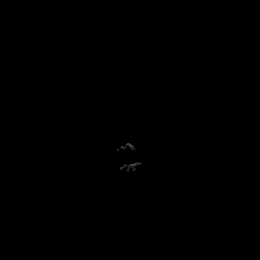

[Series 7: DWI · coronal · 4.0mm · 0.88mm/px · 6 of 80 slices shown (3 of 4)]
[im 1/80]
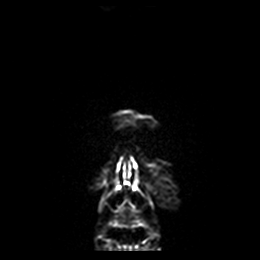
[im 16/80]
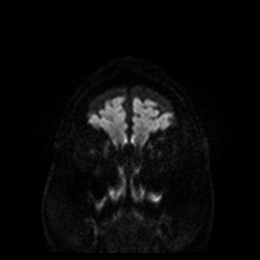
[im 32/80]
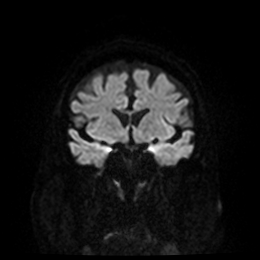
[im 48/80]
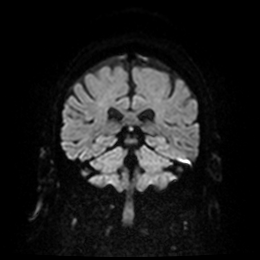
[im 64/80]
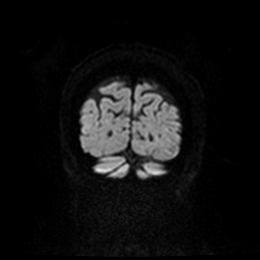
[im 80/80]
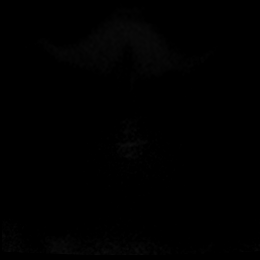

[Series 8: DWI · coronal · 4.0mm · 0.88mm/px · 3 of 40 slices shown (4 of 4)]
[im 1/40]
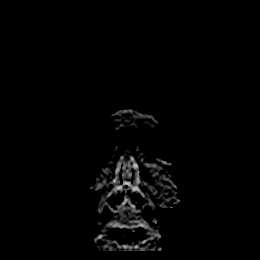
[im 20/40]
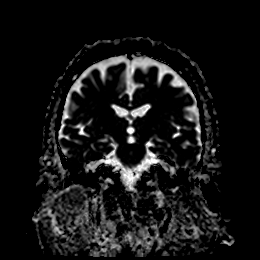
[im 40/40]
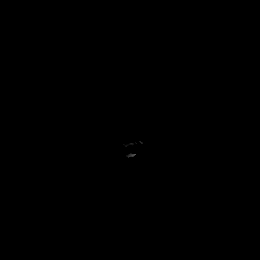

[Series 9: T1 · sagittal · 5.0mm · 0.75mm/px · 2 of 29 slices shown]
[im 1/29]
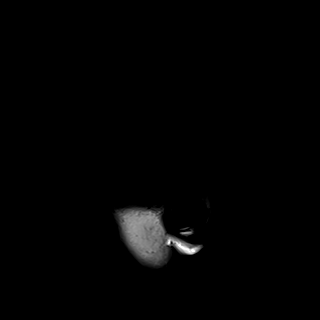
[im 29/29]
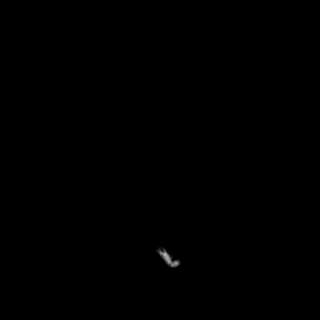

[Series 10: T2 · axial · 5.0mm · 0.75mm/px · z∈[-95,+66]mm · 2 of 28 slices shown (1 of 2)]
[im 1/28]
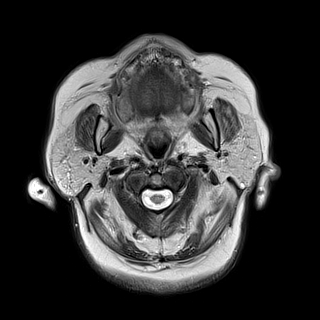
[im 28/28]
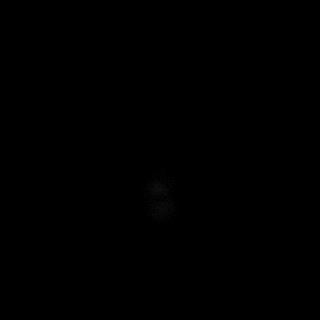

[Series 11: FLAIR · axial · 5.0mm · 0.47mm/px · z∈[-95,+67]mm · 2 of 28 slices shown]
[im 1/28]
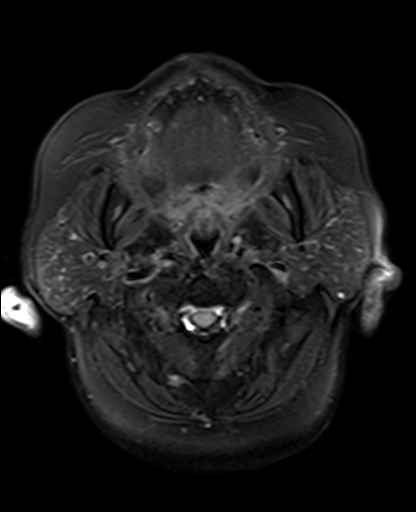
[im 28/28]
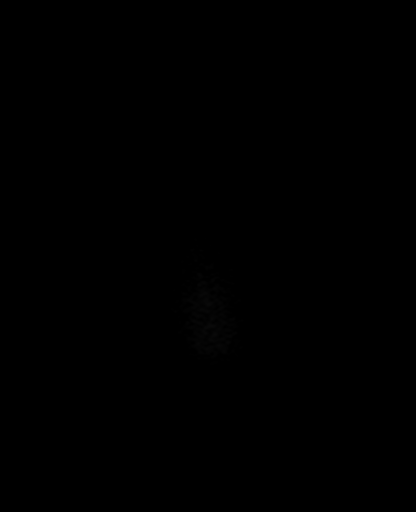

[Series 12: mag_images · axial · 3.0mm · 0.94mm/px · z∈[-96,+68]mm · 4 of 56 slices shown]
[im 1/56]
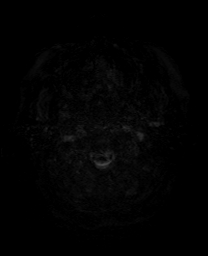
[im 19/56]
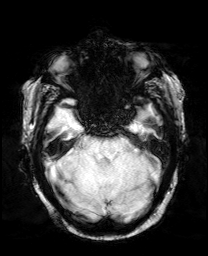
[im 37/56]
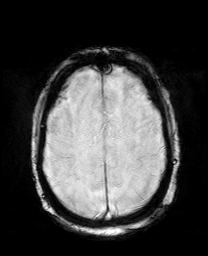
[im 56/56]
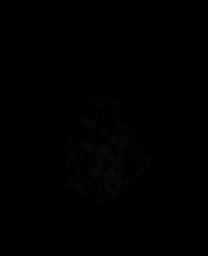

[Series 13: pha_images · axial · 3.0mm · 0.94mm/px · z∈[-96,+68]mm · 4 of 56 slices shown]
[im 1/56]
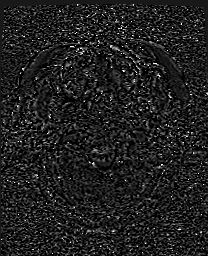
[im 19/56]
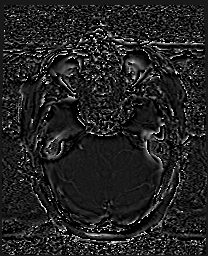
[im 37/56]
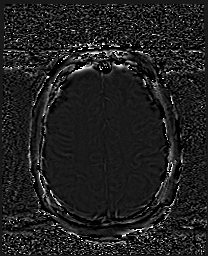
[im 56/56]
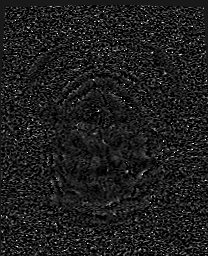

[Series 14: swi_images · axial · 3.0mm · 0.94mm/px · z∈[-96,+68]mm · 4 of 56 slices shown]
[im 1/56]
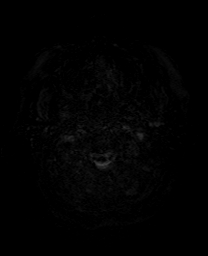
[im 19/56]
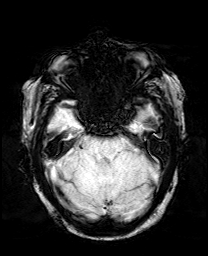
[im 37/56]
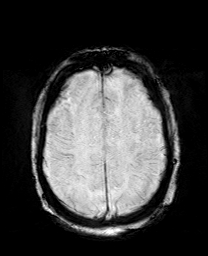
[im 56/56]
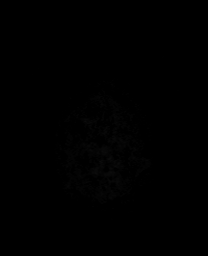

[Series 15: mip_images(sw) · axial · 24.0mm · 0.94mm/px · z∈[-86,+58]mm · 3 of 49 slices shown]
[im 1/49]
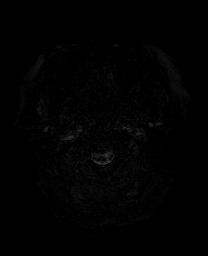
[im 25/49]
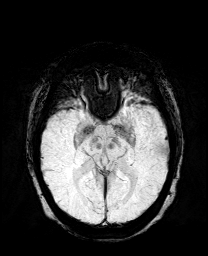
[im 49/49]
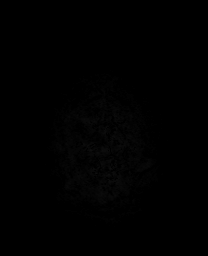

[Series 17: T2 · coronal · 5.0mm · 0.34mm/px · 2 of 34 slices shown (2 of 2)]
[im 1/34]
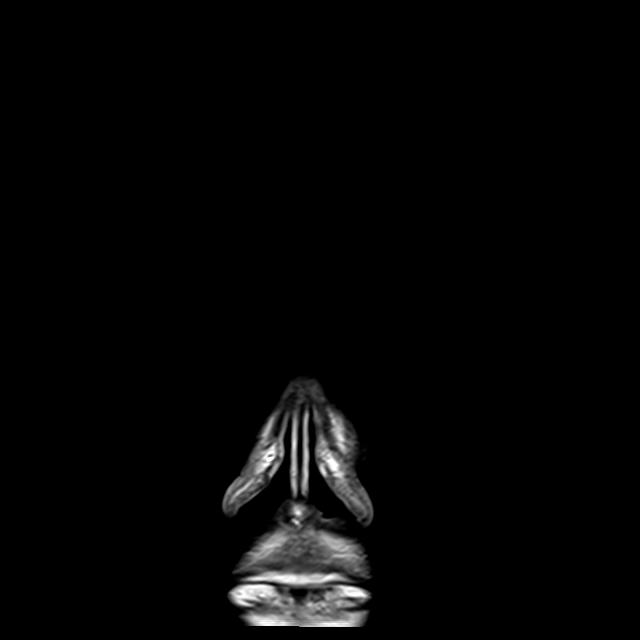
[im 34/34]
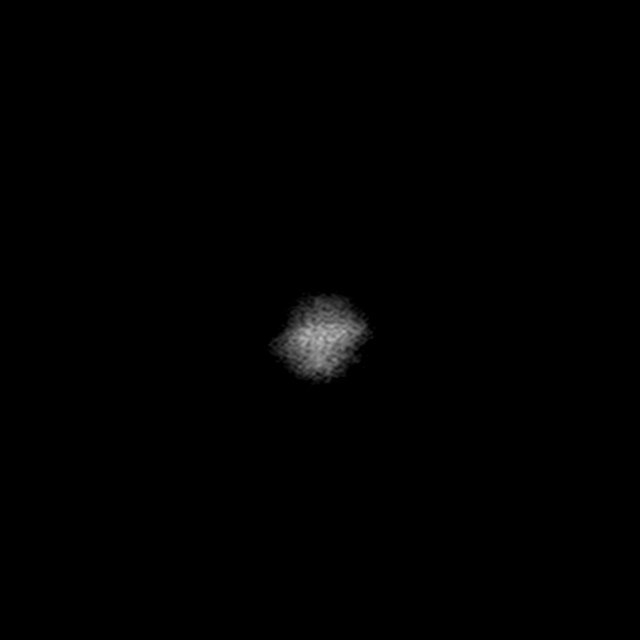

[44 of 48 positions shown; findings below may reference images not displayed]

FINDINGS: Brain: No acute infarct, mass effect or extra-axial collection. No
acute or chronic hemorrhage. Normal white matter signal, parenchymal
volume and CSF spaces. The midline structures are normal.

Vascular: Major flow voids are preserved.

Skull and upper cervical spine: Normal calvarium and skull base.
Visualized upper cervical spine and soft tissues are normal.

Sinuses/Orbits:No paranasal sinus fluid levels or advanced mucosal
thickening. No mastoid or middle ear effusion. Normal orbits.
IMPRESSION: Normal brain MRI.

## 2024-01-01 IMAGING — CT CT ANGIO CHEST
2 of 6 series · 18 of 46 positions shown · IV contrast (agent unspecified)
Comparison: Chest radiograph dated 06/01/2021.

CLINICAL DATA: Chest pain and shortness of breath. History of
breast cancer.

EXAM:
CT ANGIOGRAPHY CHEST WITH CONTRAST
TECHNIQUE: Multidetector CT imaging of the chest was performed using the
standard protocol during bolus administration of intravenous
contrast. Multiplanar CT image reconstructions and MIPs were
obtained to evaluate the vascular anatomy.

[Series 7: thins · axial · 0.58mm/px · z∈[+1082,+1330]mm · 15 of 272 slices shown]
[im 12/272  lung]
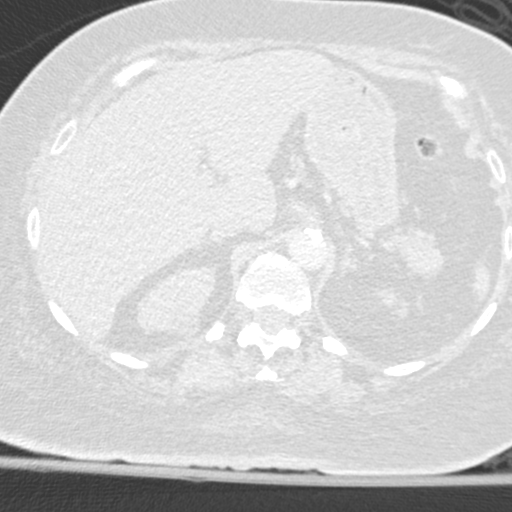
[im 36/272  soft-tissue]
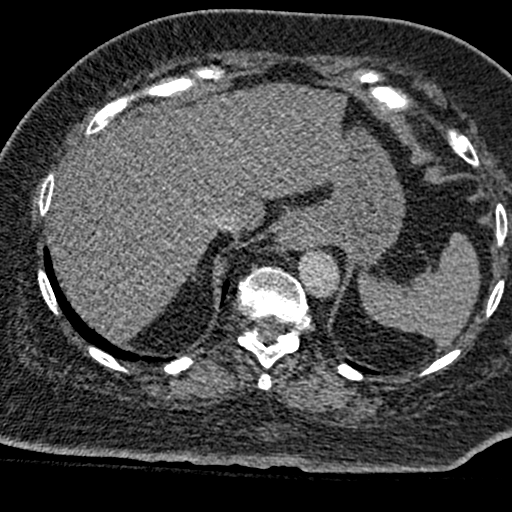
[im 48/272  lung]
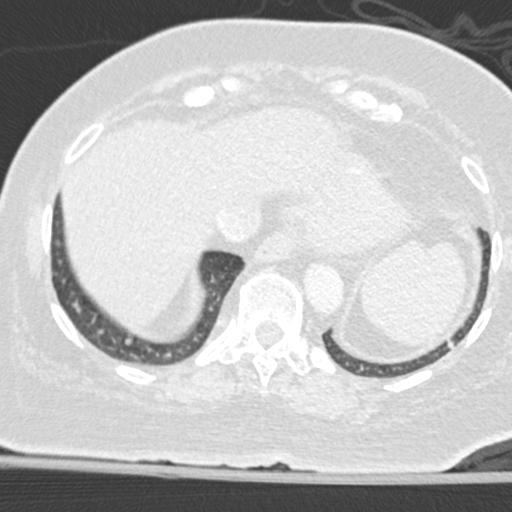
[im 71/272  soft-tissue]
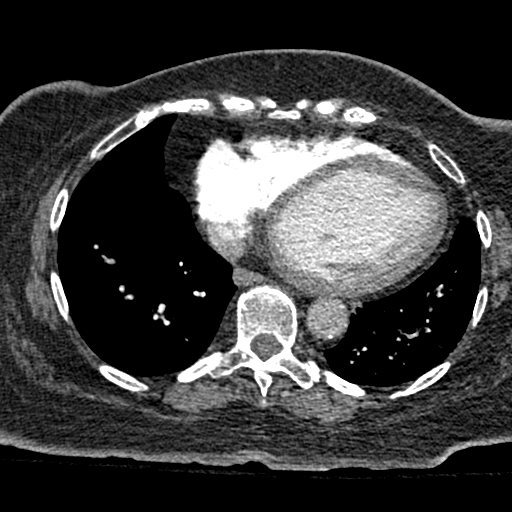
[im 83/272  lung]
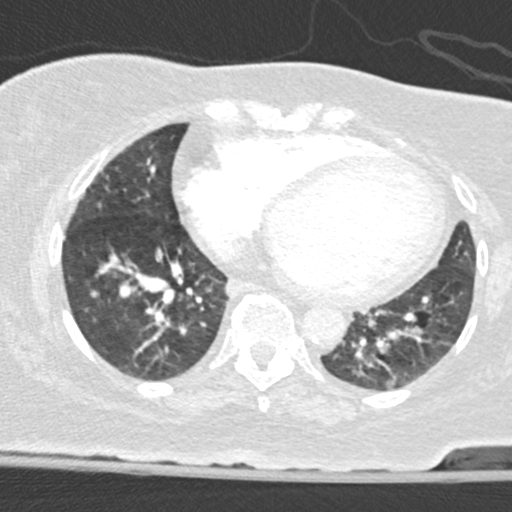
[im 107/272  soft-tissue]
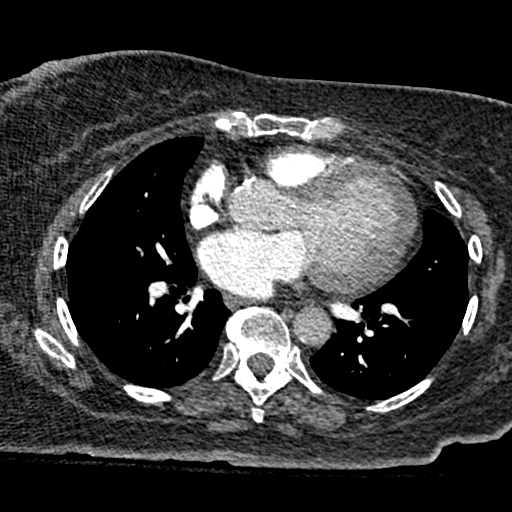
[im 118/272  lung]
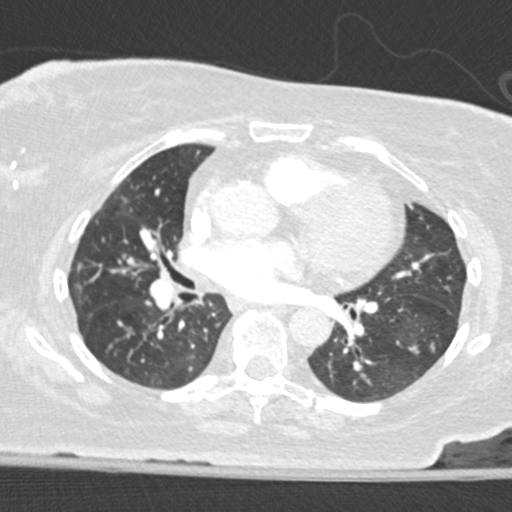
[im 142/272  soft-tissue]
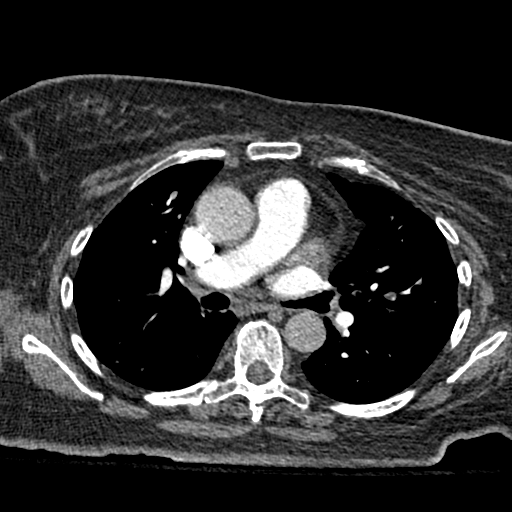
[im 154/272  lung]
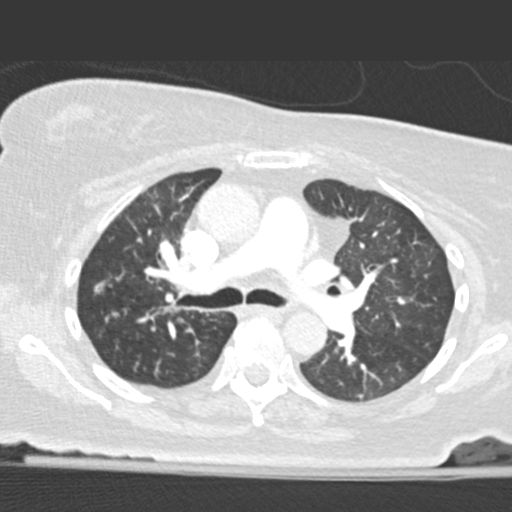
[im 165/272  soft-tissue]
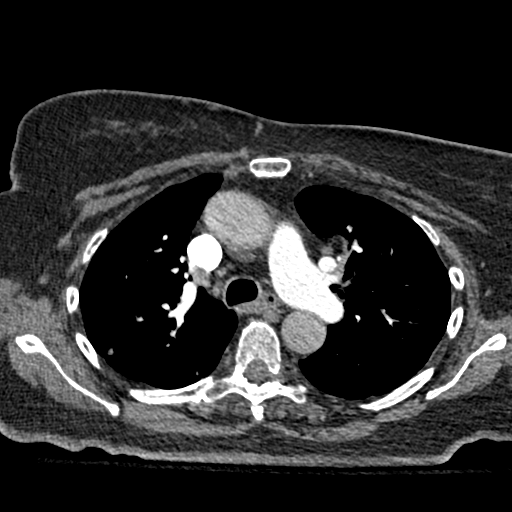
[im 189/272  lung]
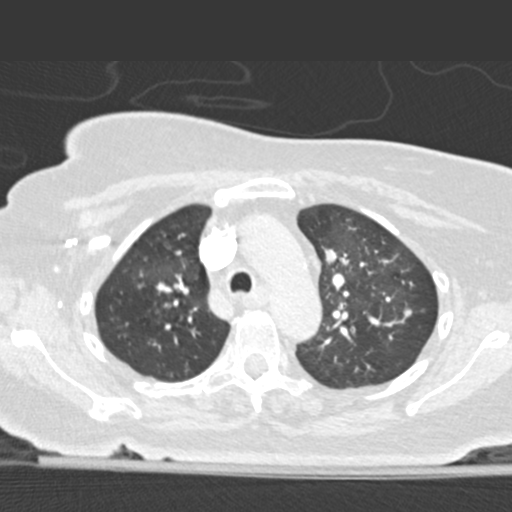
[im 201/272  soft-tissue]
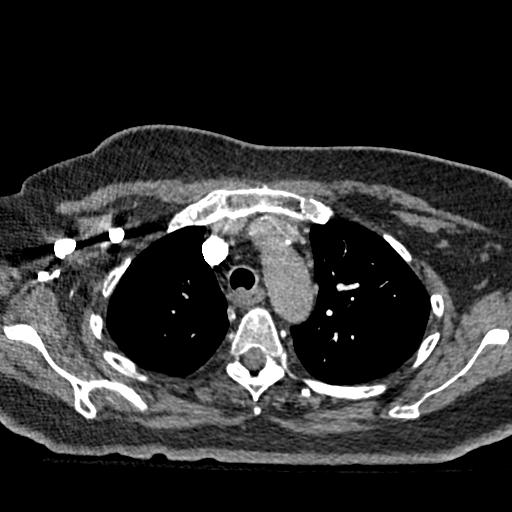
[im 224/272  lung]
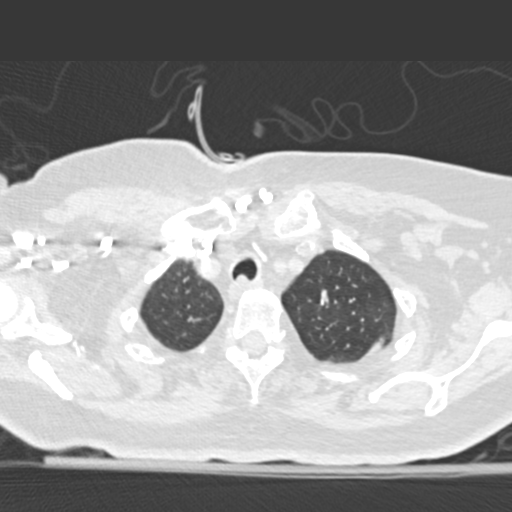
[im 236/272  soft-tissue]
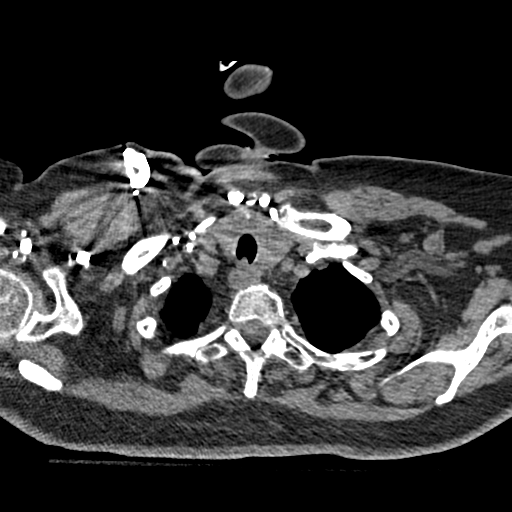
[im 260/272  lung]
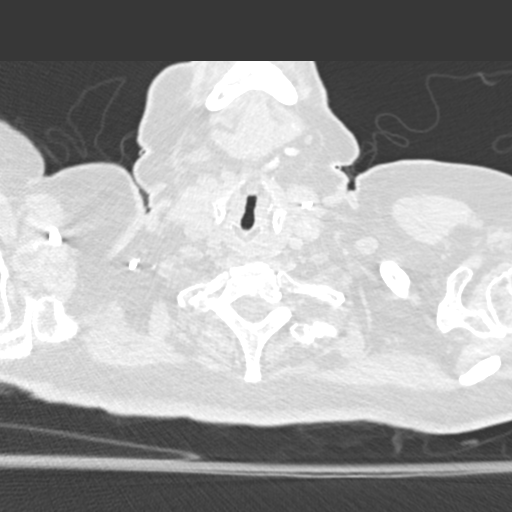

[Series 8: coronal mpr · coronal · 0.59mm/px · 3 of 124 slices shown]
[im 31/124  soft-tissue]
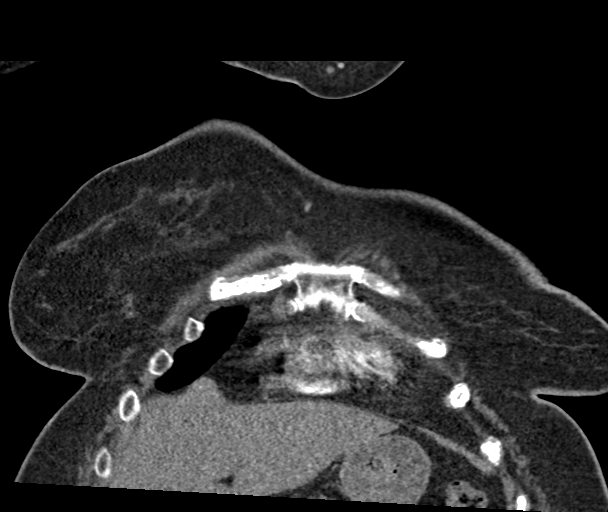
[im 62/124  soft-tissue]
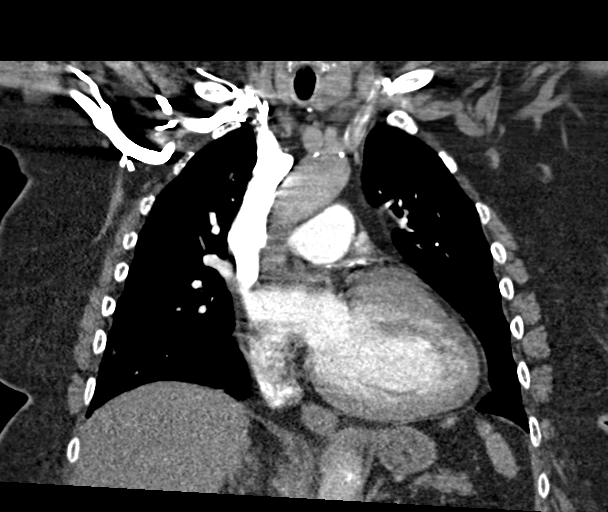
[im 93/124  soft-tissue]
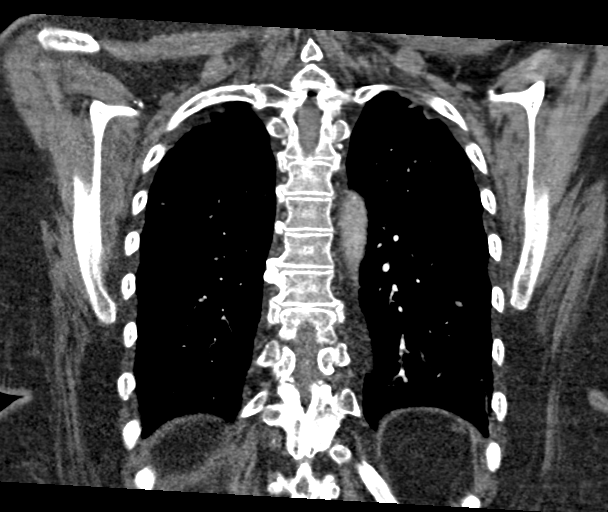

[18 of 46 positions shown; findings below may reference images not displayed]

RADIATION DOSE REDUCTION: This exam was performed according to the
departmental dose-optimization program which includes automated
exposure control, adjustment of the mA and/or kV according to
patient size and/or use of iterative reconstruction technique.

CONTRAST:  54mL OMNIPAQUE IOHEXOL 350 MG/ML SOLN
FINDINGS: Cardiovascular: There is cardiomegaly. No pericardial effusion.
There is coronary vascular calcification. Mild atherosclerotic
calcification of the thoracic aorta. No aneurysmal dilatation or
dissection. Evaluation of the pulmonary arteries is limited due to
respiratory motion artifact and suboptimal visualization the
peripheral branches. No pulmonary artery embolus identified.

Mediastinum/Nodes: No hilar or mediastinal adenopathy. The esophagus
is grossly unremarkable. No mediastinal fluid collection.

Lungs/Pleura: Scattered pulmonary nodules measure up to 13 mm in the
right lower lobe. Findings concerning for metastatic disease in this
patient with history of breast cancer. Clinical correlation is
recommended. No focal consolidation, pleural effusion, or
pneumothorax. The central airways are patent.

Upper Abdomen: A 4 mm nonobstructing right renal upper pole
calculus. Fatty liver.

Musculoskeletal: Osteopenia. No acute osseous pathology. Right-sided
Port-A-Cath tip over central SVC.

Review of the MIP images confirms the above findings.
IMPRESSION: 1. No CT evidence of pulmonary artery embolus.
2. Multiple bilateral pulmonary nodules concerning for metastatic
disease in this patient with history of breast cancer.
3. Cardiomegaly with coronary vascular calcification.
4. Fatty liver.
5. A 4 mm nonobstructing right renal upper pole calculus.
6. Aortic Atherosclerosis ([id number]).
